# Patient Record
Sex: Female | Born: 1935
Health system: Southern US, Community
[De-identification: ages and names within clinical notes are randomized; demographics above are authoritative.]

## PROBLEM LIST (undated history)

## (undated) DIAGNOSIS — F0280 Dementia in other diseases classified elsewhere without behavioral disturbance: Secondary | ICD-10-CM

## (undated) DIAGNOSIS — M109 Gout, unspecified: Secondary | ICD-10-CM

## (undated) DIAGNOSIS — J449 Chronic obstructive pulmonary disease, unspecified: Secondary | ICD-10-CM

## (undated) DIAGNOSIS — M1990 Unspecified osteoarthritis, unspecified site: Secondary | ICD-10-CM

## (undated) DIAGNOSIS — F419 Anxiety disorder, unspecified: Secondary | ICD-10-CM

## (undated) DIAGNOSIS — G309 Alzheimer's disease, unspecified: Secondary | ICD-10-CM

## (undated) DIAGNOSIS — A310 Pulmonary mycobacterial infection: Secondary | ICD-10-CM

## (undated) DIAGNOSIS — R51 Headache: Secondary | ICD-10-CM

## (undated) DIAGNOSIS — J479 Bronchiectasis, uncomplicated: Secondary | ICD-10-CM

## (undated) DIAGNOSIS — I82409 Acute embolism and thrombosis of unspecified deep veins of unspecified lower extremity: Secondary | ICD-10-CM

## (undated) DIAGNOSIS — K589 Irritable bowel syndrome without diarrhea: Secondary | ICD-10-CM

## (undated) DIAGNOSIS — Z77120 Contact with and (suspected) exposure to mold (toxic): Secondary | ICD-10-CM

## (undated) DIAGNOSIS — I10 Essential (primary) hypertension: Secondary | ICD-10-CM

## (undated) DIAGNOSIS — J189 Pneumonia, unspecified organism: Secondary | ICD-10-CM

## (undated) HISTORY — DX: Contact with and (suspected) exposure to mold (toxic): Z77.120

## (undated) HISTORY — PX: FRACTURE SURGERY: SHX138

## (undated) HISTORY — PX: ABDOMINAL HYSTERECTOMY: SHX81

## (undated) HISTORY — PX: CATARACT EXTRACTION: SUR2

## (undated) HISTORY — PX: CHOLECYSTECTOMY: SHX55

---

## 2003-01-23 ENCOUNTER — Ambulatory Visit (HOSPITAL_COMMUNITY): Admission: RE | Admit: 2003-01-23 | Discharge: 2003-01-23 | Payer: Self-pay | Admitting: Pulmonary Disease

## 2003-01-31 ENCOUNTER — Ambulatory Visit (HOSPITAL_COMMUNITY): Admission: RE | Admit: 2003-01-31 | Discharge: 2003-01-31 | Payer: Self-pay | Admitting: Pulmonary Disease

## 2003-08-03 ENCOUNTER — Observation Stay (HOSPITAL_COMMUNITY): Admission: RE | Admit: 2003-08-03 | Discharge: 2003-08-04 | Payer: Self-pay | Admitting: Obstetrics & Gynecology

## 2006-09-15 DIAGNOSIS — I82409 Acute embolism and thrombosis of unspecified deep veins of unspecified lower extremity: Secondary | ICD-10-CM

## 2006-09-15 HISTORY — DX: Acute embolism and thrombosis of unspecified deep veins of unspecified lower extremity: I82.409

## 2007-01-02 ENCOUNTER — Inpatient Hospital Stay (HOSPITAL_COMMUNITY): Admission: EM | Admit: 2007-01-02 | Discharge: 2007-01-05 | Payer: Self-pay | Admitting: Emergency Medicine

## 2007-01-04 ENCOUNTER — Ambulatory Visit: Payer: Self-pay | Admitting: Physical Medicine & Rehabilitation

## 2007-03-23 ENCOUNTER — Ambulatory Visit: Payer: Self-pay

## 2007-04-05 ENCOUNTER — Inpatient Hospital Stay (HOSPITAL_COMMUNITY): Admission: EM | Admit: 2007-04-05 | Discharge: 2007-04-08 | Payer: Self-pay | Admitting: Emergency Medicine

## 2010-08-22 ENCOUNTER — Inpatient Hospital Stay (HOSPITAL_COMMUNITY): Admission: EM | Admit: 2010-08-22 | Discharge: 2010-07-06 | Payer: Self-pay | Admitting: Emergency Medicine

## 2010-11-27 LAB — DIFFERENTIAL
Basophils Absolute: 0 10*3/uL (ref 0.0–0.1)
Basophils Relative: 0 % (ref 0–1)
Eosinophils Absolute: 0.1 10*3/uL (ref 0.0–0.7)
Eosinophils Relative: 0 % (ref 0–5)
Lymphocytes Relative: 6 % — ABNORMAL LOW (ref 12–46)
Lymphs Abs: 0.8 10*3/uL (ref 0.7–4.0)
Monocytes Absolute: 0.8 10*3/uL (ref 0.1–1.0)
Monocytes Relative: 6 % (ref 3–12)
Neutro Abs: 11.7 10*3/uL — ABNORMAL HIGH (ref 1.7–7.7)
Neutrophils Relative %: 88 % — ABNORMAL HIGH (ref 43–77)

## 2010-11-27 LAB — CBC
HCT: 37.6 % (ref 36.0–46.0)
Hemoglobin: 12.8 g/dL (ref 12.0–15.0)
MCH: 33.7 pg (ref 26.0–34.0)
MCHC: 34 g/dL (ref 30.0–36.0)
MCV: 98.9 fL (ref 78.0–100.0)
Platelets: 138 10*3/uL — ABNORMAL LOW (ref 150–400)
RBC: 3.8 MIL/uL — ABNORMAL LOW (ref 3.87–5.11)
RDW: 14.4 % (ref 11.5–15.5)
WBC: 13.3 10*3/uL — ABNORMAL HIGH (ref 4.0–10.5)

## 2010-11-27 LAB — URINALYSIS, ROUTINE W REFLEX MICROSCOPIC
Bilirubin Urine: NEGATIVE
Glucose, UA: NEGATIVE mg/dL
Hgb urine dipstick: NEGATIVE
Ketones, ur: NEGATIVE mg/dL
Nitrite: NEGATIVE
Protein, ur: NEGATIVE mg/dL
Specific Gravity, Urine: 1.01 (ref 1.005–1.030)
Urobilinogen, UA: 0.2 mg/dL (ref 0.0–1.0)
pH: 6.5 (ref 5.0–8.0)

## 2010-11-27 LAB — URINE MICROSCOPIC-ADD ON

## 2010-11-27 LAB — URINE CULTURE
Colony Count: 15000
Culture  Setup Time: 201110172305

## 2010-11-27 LAB — BASIC METABOLIC PANEL
BUN: 11 mg/dL (ref 6–23)
CO2: 28 mEq/L (ref 19–32)
Calcium: 8.3 mg/dL — ABNORMAL LOW (ref 8.4–10.5)
Chloride: 96 mEq/L (ref 96–112)
Creatinine, Ser: 0.95 mg/dL (ref 0.4–1.2)
GFR calc Af Amer: >60 mL/min (ref >60)
GFR calc non Af Amer: 57 mL/min — ABNORMAL LOW (ref >60)
Glucose, Bld: 201 mg/dL — ABNORMAL HIGH (ref 70–99)
Potassium: 3.3 mEq/L — ABNORMAL LOW (ref 3.5–5.1)
Sodium: 131 mEq/L — ABNORMAL LOW (ref 135–145)

## 2011-01-28 NOTE — Discharge Summary (Signed)
[name], [name] NO.:  [id number]   MEDICAL RECORD NO.:  [id number]          PATIENT TYPE:  INP   LOCATION:  5002                         FACILITY:  [facility]   PHYSICIAN:  Loreta Ave, M.D. DATE OF BIRTH:  May 02, 1928   DATE OF ADMISSION:  01/02/2007  DATE OF DISCHARGE:  01/05/2007                               DISCHARGE SUMMARY   FINAL DIAGNOSES:  1. Status post open reduction and internal fixation left ankle and      I&D.  2. Gout.  3. Depression.  4. Hypertension.   HISTORY OF PRESENT ILLNESS:  A 77 year old white female fell in her home  on January 02, 2007 walking downstairs.  She twisted her left ankle.  She  was unable to ambulate after this incident.  She was brought to the  __________ ankle.  After the patient was evaluated in the ER she was  taken to the operating room and an open reduction internal fixation  procedure of the left ankle performed with an I&D.  Surgeon, Loreta Ave, M.D. and assistant Genene Churn. Barry Dienes, P.A.-C.  Anesthesia was  general.  Tourniquet time two hours.  One drain placed.  There were no  surgical or anesthesia complications; and the patient was transferred to  recovery in stable condition.  On January 03, 2007 the patient had good  pain control, complained of productive cough with green sputum times one  to two weeks.  She thinks that she has had the flu; but was not ever  evaluated by her primary care doctor.  Some dyspnea over the last couple  of weeks.  No complaints of chest pain.  Temperature 101.7, pulse 108,  respirations 20, blood pressure 136/66.  WBC 8.8, hematocrit 26.4,  hemoglobin 9.2, platelets 164.  Sodium 136, potassium 3.5, chloride 98,  CO2 32, BUN 6, creatinine 0.99, glucose 119.  Lungs with scattered  rhonchi bilaterally, a few wheezes.  Wound intact in the left lower  extremity.  Sensation intact.  Moves toes well.  Respiratory symptoms  likely secondary to COPD exacerbation.  Start a Z-pac.  Give  albuterol  nebulize treatment.  Discontinue Percocet.  Start her on Vicodin.  All  movement in left lower extremity.   On January 04, 2007 the patient doing well.  Good pain control.  Cough and  dyspnea improved with nebulizer treatment.  Temp 98, pulse 118,  respirations 19 blood pressure 133/63.  Hemoglobin 8.9 and hematocrit  25.2, glucose 134.  The wound looked good, Staples intact.  Penrose  drain removed.  Moves toes well and sensation intact.  Good capillary  refill.   On January 05, 2007 the patient doing much better, they agree with  therapy.  Cough improved.  No complaints of fatigue.  Dizziness when  getting up.  She says that she is ready to go home.  Vital signs stable,  afebrile.  Hemoglobin 8.6 hematocrit 25.6, WBC 9.1, platelets 179.  Sodium 136,  potassium 3.6, chloride 99, CO2 32, BUN 8, creatinine 0.8,  glucose 130.  Splint intact.  Sensation intact.  Moves toes well.  CONDITION:  Good and stable.   DISPOSITION:  Discharge home.   MEDICATIONS:  1. Norco 5/325 one to two p.o. q.4-6 h. pain.  2. FeSo4 taking 325 mg p.o. b.i.d.; to be taken with food.  3. Azithromycin times three days.  4. Indocin.  5. Allopurinol.  6. Paxil.  7. Triamterene/HCTZ.  8. Atenolol.   INSTRUCTIONS:  The patient will work with the home health, PT and OT.  She is nonweightbearing on left lower extremity times six to eight weeks  postop.  Will not remove splint or get this wet.  Follow up in one week  for a recheck.  Return sooner if needed.  Elevate left lower extremity  as much as possible to decrease swelling.      Genene Churn. Denton Meek.      Loreta Ave, M.D.  Electronically Signed    JMO/MEDQ  D:  03/10/2007  T:  03/10/2007  Job:  [id number]

## 2011-01-28 NOTE — Group Therapy Note (Signed)
NAMEREY, FORS NO.:  [id number]   MEDICAL RECORD NO.:  [id number]          PATIENT TYPE:  INP   LOCATION:  A202                          FACILITY:  [facility]   PHYSICIAN:  Edward L. Juanetta Gosling, M.D.DATE OF BIRTH:  1928-02-24   DATE OF PROCEDURE:  04/05/2007  DATE OF DISCHARGE:                                 PROGRESS NOTE   Stacie Wright is feeling well and has no complaints.  She is overall  improved.   PHYSICAL EXAMINATION:  VITAL SIGNS:  Her physical examination today  shows temperature is 98.1, pulse 72, respirations 20, blood pressure  154/68, O2 saturations 96% on room air.  CHEST:  Clear.  HEART:  Regular.  GENERAL:  She looks much more comfortable.   Her hemoglobin is 12.4 the same as yesterday.  White count 6100.  Protime is down to 50.5 with an INR of 5.1.   ASSESSMENT:  She has over anticoagulation with a coagulopathy.   PLAN:  Watch her one more day.  She shows no overt signs of bleeding but  with her having a fracture I think we should make because of her  instability I want to make sure that her finding is better before she  goes home.  She is going to have a PT/INR and H&H in the morning and I  am going to go ahead and get a venous study of her left leg because she  was actually scheduled for that as an outpatient.  Otherwise we will  plan to continue her medications, treatments, and follow.      Edward L. Juanetta Gosling, M.D.  Electronically Signed     ELH/MEDQ  D:  04/07/2007  T:  04/07/2007  Job:  [id number]

## 2011-01-28 NOTE — Group Therapy Note (Signed)
NAMELACHAE, HOHLER NO.:  [id number]   MEDICAL RECORD NO.:  [id number]          PATIENT TYPE:  INP   LOCATION:  A202                          FACILITY:  [facility]   PHYSICIAN:  Edward L. Juanetta Gosling, M.D.DATE OF BIRTH:  June 21, 1928   DATE OF PROCEDURE:  04/08/2007  DATE OF DISCHARGE:  04/08/2007                                 PROGRESS NOTE   Ms. Kramar says she is feeling better.  She has no complaints.  She has  not noticed any sort of bleeding of any kind.  She feels well and she  wants to go home.  Her prothrombin time and INR still elevated, but she  is now at an INR of 5 with a ProTime of 49.8.  I have discussed this  with her husband and with her, explained the risks versus benefits of  going home.  She wants to go home and just not take any Coumadin until  the 26th at least.  I will have her get a prothrombin time done by Home  Health then.  I expect at that point on the 26th her INR will be about  3.  At that point she will need to restart Coumadin because otherwise  she will have a subtherapeutic ProTime.  In the meantime I have  explained her not to take any aspirin, to be very careful, avoid falls,  etc.  She understands, is anxious to go home.  She did have a study done  yesterday that showed she has DVT still, not to surprising.  I do not  think she has had time for the recanalization process take place.      Edward L. Juanetta Gosling, M.D.  Electronically Signed     ELH/MEDQ  D:  04/08/2007  T:  04/08/2007  Job:  [id number]

## 2011-01-28 NOTE — Discharge Summary (Signed)
NAMEATHALIAH, BAUMBACH NO.:  [id number]   MEDICAL RECORD NO.:  [id number]          PATIENT TYPE:  INP   LOCATION:  A202                          FACILITY:  [facility]   PHYSICIAN:  Edward L. Juanetta Gosling, M.D.DATE OF BIRTH:  03/12/1928   DATE OF ADMISSION:  04/05/2007  DATE OF DISCHARGE:  07/24/2008LH                               DISCHARGE SUMMARY   DISCHARGE DIAGNOSES:  1. Coagulopathy.  2. Hypertension.  3. Deep vein thrombosis.  4. Fractured leg.  5. History of cholecystectomy.  6. History of hysterectomy.  7. History of a rectal surgery.  8. Gout.  9. Depression.   Marland KitchenHISTORY:  Ms. Pember is a 77 year old who had been on Coumadin and  Lovenox.  She had a pro time about a week ago that was therapeutic.  I  asked the family to stop the Lovenox but her husband did not understand  that as they were continuing her on Lovenox.  She came to my office on  the 17th.  INR was 4.  I was out of my office.  This was called Dr.  Ouida Sills, who advised her to cut the dose but there was miscommunication  between Mr. Letendre and my office and Dr. Alonza Smoker office and she has  continued on the same dose.  On the 21s I had her come to the emergency  room, where she was noted to have a very elevated prothrombin time and  INR, so she is being admitted to be covered.  She did receive vitamin K  in the emergency room as well.   Physical exam showed that she did not appear to be in any acute  distress.  Temperature was 98.3, pulse 86, respirations 20, blood  pressure 157/78, O2 saturation 96%.  She said that she had had a nose  bleed but she had no bleeding in her nose at the time of admission.  The  rest of her examination was unremarkable.  She did not have any signs of  bleeding.   Her hemoglobin was 12.4, hematocrit 37.7.  Prothrombin time was 76.6  with an INR of 8.4 after receiving 2 mg of vitamin K in the emergency  room.  She was watched carefully.  Her INR came down to 5.  She  requested to be allowed to be discharged at that point and I discharged  her home with home health services, and she is going to have a PT/INR  tomorrow and on the 25th, at which point I am expecting that probably on  the 25th she will be able to resume her Coumadin.  I am going to stop  the Lovenox.  She is going to continue on:   1. Allopurinol 300 mg.  2. Paxil 20 mg daily.  3. Triamterene/hydrochlorothiazide 37.5/25 mg one daily.  4. Tenormin 25 mg daily.   She was told to avoid aspirin in any form, to call with questions.      Edward L. Juanetta Gosling, M.D.  Electronically Signed     ELH/MEDQ  D:  04/08/2007  T:  04/08/2007  Job:  [id number]

## 2011-01-28 NOTE — H&P (Signed)
Stacie Wright, Stacie Wright NO.:  [id number]   MEDICAL RECORD NO.:  [id number]          PATIENT TYPE:  INP   LOCATION:  A202                          FACILITY:  [facility]   PHYSICIAN:  Edward L. Juanetta Gosling, M.D.DATE OF BIRTH:  15-Jul-1928   DATE OF ADMISSION:  04/05/2007  DATE OF DISCHARGE:  LH                              HISTORY & PHYSICAL   REASON FOR ADMISSION:  Coagulopathy.   HISTORY:  Ms. Stacie Wright is a 77 year old who came to the emergency room.  She had been on Coumadin and Lovenox originally and after a protime done  of approximately a week ago, I had told the family to stop the Lovenox.  Mr. Jantz did not understand that and she has been continuing on  Lovenox.  She came to my office on Thursday, had a prothrombin time done  and the INR was 4.  This was called to Dr. Ouida Sills, who was covering for  me and there was a miscommunication between my office and  Dr. Alonza Smoker  office and she has continued on the same dose.  On the day of admission,  April 05, 2007, I asked her to come to the hospital to get a prothrombin  time done because of concern about the PT/INR and her PT/INR was  elevated.  She had had an episode of epistaxis that stopped with no  treatment except for pressure and she has been brought into the hospital  because of the elevated INR and the epistaxis.  I have told her and her  husband both now that she should stop the Lovenox.  She is going to have  to have her Coumadin dose adjusted.  She did receive some vitamin K in  the emergency room.   PAST MEDICAL HISTORY:  1. Positive for hypertension.  2. She has had a severe fracture in the leg and ended up with a DVT as      a complication of that.  3. She has had a cholecystectomy.  4. Hysterectomy.  5. Rectal surgery.  6. History of  hypertension.   SOCIAL HISTORY:  She is a nonsmoker.  She does not drink any alcohol.  She does not use any illicit drugs.  She lives at home with her husband.   FAMILY  HISTORY:  Negative for any coagulopathy.   PHYSICAL EXAMINATION:  GENERAL APPEARANCE:  Now shows a well-developed,  well-nourished female who does not appear to be in any acute distress at  this point.  VITAL SIGNS:  Her temperature is 98.3, pulse 86, respirations 20, blood  pressure 157/78, O2 sat 96% on room air.  HEENT:  Pupils are reactive to light and accommodation.  She does not  have any bleeding in her nose now.  NECK:  Supple without masses.  CHEST:  Clear.  HEART:  Regular.  ABDOMEN:  Soft.  EXTREMITIES:  No edema.  CENTRAL NERVOUS SYSTEM:  Examination is grossly intact.   LABORATORY DATA:  Hemoglobin level 12.4, hematocrit 37.  Her prothrombin  time now 76.6 with an INR of 8.4.  She received vitamin K in the  emergency  room.  She received 2 mg p.o.  Typically, there will be a  significant change in INR with holding Coumadin and receiving vitamin K.  I am going to watch her carefully, wait until the morning, repeat her  PT/INR and see how much it changes now that she is off the Lovenox and  has received vitamin K and her Coumadin has been held.      Edward L. Juanetta Gosling, M.D.  Electronically Signed     ELH/MEDQ  D:  04/06/2007  T:  04/06/2007  Job:  [id number]

## 2011-01-31 NOTE — H&P (Signed)
NAMEELAN, MCELVAIN NO.:  [id number]   MEDICAL RECORD NO.:  [id number]          PATIENT TYPE:  INP   LOCATION:  1826                         FACILITY:  [facility]   PHYSICIAN:  Loreta Ave, M.D. DATE OF BIRTH:  11-Sep-1928   DATE OF ADMISSION:  01/02/2007  DATE OF DISCHARGE:                              HISTORY & PHYSICAL   CHIEF COMPLAINT:  Open left ankle fracture.   HISTORY OF PRESENT ILLNESS:  This 77 year old white female fell today in  her home, going down some stairs.  She twisted her left ankle.  She was  unable to ambulate after this incident.  She did have an open injury to  the medial ankle with bleeding.  The patient was brought to the [facility] Emergency Department and x-rays were taken which  showed a trimalleolar fracture/dislocation of the left ankle.  The  patient denies any other injuries.   CURRENT MEDICATIONS:  1. Hyoscyamine sulfate.  2. Indocin.  3. Allopurinol.  4. Fluoxetine.  5. Triamterene/hydrochlorothiazide.  6. Atenolol.   ALLERGIES:  No known drug allergies.   PAST MEDICAL/SURGICAL HISTORY:  1. Gout.  2. Depression.  3. Hypertension.  4. Gastroesophageal reflux disease.  5. Irritable bowel syndrome.   FAMILY HISTORY:  Noncontributory.   SOCIAL HISTORY:  The patient is married.  Denies smoking or ETOH use.   REVIEW OF SYSTEMS:  Denies any cardiac, pulmonary, GI, genitourinary or  neuro complaints.   PHYSICAL EXAMINATION:  VITAL SIGNS:  Temperature 98.7 degrees, pulse 75,  respirations 14, blood pressure 163/86.  GENERAL:  A pleasant elderly white female, alert and oriented x3, in no  acute distress.  She is comfortable after being given IV pain  medications.  NECK:  Good range of motion.  Nontender.  LUNGS:  Clear to auscultation bilaterally.  No wheezes, rales or  rhonchi.  HEART:  Regular rate and rhythm.  S1 and S2.  ABDOMEN:  Round, non-distended.  Normal bowel sounds x4.  Soft,  nontender.  EXTREMITIES:  Left ankle:  There is a valgus dislocation.  There is a  large open laceration to the medial ankle.  There is bleeding but this  is much controlled currently.  Moves her toes well.  Sensation intact.   X-rays show a sharp malleolar fracture/dislocation.   LABORATORY DATA:  WBC 11.2, hemoglobin 13, hematocrit 38.4, platelets  187.  PT 14, INR 1.1.  Sodium 137, potassium 3.1, chloride 102, glucose  103, BUN 9, creatinine 1.l.   ASSESSMENT:  Open left ankle fracture/dislocation.   PLAN:  Will take the patient to the operating room today for an ORIF of  the left ankle and I&D.  The procedure is discussed with the patient and  with her husband who is present.  They understand the potential risks  that are involved with the procedure.  She will be admitted to the  orthopedic floor after our surgery.  She will be non-weightbearing to  the left lower extremity.  All questions were answered.     ______________________________  Modena Nunnery, P.A.-C.      Reuel Boom  Georg Ruddle, M.D.  Electronically Signed    JM/MEDQ  D:  01/02/2007  T:  01/02/2007  Job:  [id number]

## 2011-01-31 NOTE — H&P (Signed)
NAMEJORDON, Stacie Wright                         ACCOUNT NO.:  [id number]   MEDICAL RECORD NO.:  [id number]                   PATIENT TYPE:  AMB   LOCATION:  DAY                                  FACILITY:  [facility]   PHYSICIAN:  Lazaro Arms, M.D.                DATE OF BIRTH:  July 15, 1928   DATE OF ADMISSION:  DATE OF DISCHARGE:                                HISTORY & PHYSICAL   ANTICIPATED DATE OF ADMISSION:  August 03, 2003.   HISTORY OF PRESENT ILLNESS:  Stacie Wright is a 77 year old white female, gravida  2, para 2, who presented to our office originally on June 20, 2003  thinking that her bladder had dropped.  She did not complain of any loss of  urine, but she did have urgency, but no loss of urine with coughing or  straining.  On examination she was found to have a rectocele.  Her  urodynamic testing was negative.  She does not have a cystocele or a  urethrocele.  She has a Grade III rectocele and no enterocele appreciated  even on standing exam.   PAST MEDICAL HISTORY:  1. Hypertension.  2. Gout.  3. Irritable bowel syndrome.  4. Depression.  5. Shingles.   CURRENT MEDICATIONS:  1. Paxil 20 mg a day.  2. Levbid daily.  3. Dyazide 37.5/25 a day.  4. Allopurinol 300 mg a day.  5. Tenormin 50 mg a day.   PAST SURGICAL HISTORY:  The patient had a hysterectomy in 1975 and she has  had a cholecystectomy at [facility] over in [place].   ALLERGIES:  The patient's allergies are to MULTIPLE ANTIBIOTICS, but she is  not allergic to Keflex according to specific questioning.   REVIEW OF SYSTEMS:  Review of systems is otherwise negative.   PHYSICAL EXAMINATION:  VITAL SIGNS:  Blood pressure is 140/62.  Weight is  185 pounds.  HEENT:  Unremarkable.  NECK:  Thyroid is normal.  LUNGS:  Lungs are clear.  HEART:  Heart has regular rate and rhythm without murmur, regurg or gallop.  BREASTS:  Breasts are without mass or discharge, or skin changes.  ABDOMEN:  Abdomen is  benign without hepatosplenomegaly or masses.  GENITALIA:  The patient has atrophic external genitalia.  She has a Grade  III rectocele.  No cystocele or urethrocele.  PELVIC:  Pelvic exam is otherwise negative.  RECTAL:  Rectal exam is otherwise negative, except for the rectocele.  EXTREMITIES:  Extremities are warm with no edema.  NEUROLOGIC:  Neurologic exam is grossly intact.   IMPRESSION:  1. Grade III rectocele, symptomatic.  2. Constipation.   PLAN:  The patient is admitted for a rectocele repair using a PelviSoft  graft and vaginal vault suspension.   The patient understands the risks, benefits, indications and alternatives,  and will proceed.     ___________________________________________  Lazaro Arms, M.D.   Loraine Maple  D:  08/02/2003  T:  08/03/2003  Job:  [id number]

## 2011-01-31 NOTE — Op Note (Signed)
NAMEKIIRA, BRACH NO.:  [id number]   MEDICAL RECORD NO.:  [id number]          PATIENT TYPE:  INP   LOCATION:  5002                         FACILITY:  [facility]   PHYSICIAN:  Loreta Ave, M.D. DATE OF BIRTH:  September 05, 1928   DATE OF PROCEDURE:  01/02/2007  DATE OF DISCHARGE:                               OPERATIVE REPORT   PREOPERATIVE DIAGNOSIS:  Grade 3 a markedly displaced open trimalleolar  ankle fracture  dislocation left, the entire distal tibia out through  the medial  wound.   POSTOPERATIVE DIAGNOSIS:  Grade 3 a markedly displaced open trimalleolar  ankle fracture  dislocation left, the entire distal tibia out through  the medial  wound.   PROCEDURE:  Open reduction and  internal fixation of the lateral  malleolus with a 7 hole Synthes locking plate laterally.  Closed  reduction of the posterior malleolar fragment.  Primary closure of  the  soft tissue injury, deltoid and capsular tear medially.  Loose  closure  over a Penrose drain.   SURGEON:  Loreta Ave, MD   ASSISTANT:  Genene Churn. Barry Dienes, P.A. who was present throughout the entire  case.   ANESTHESIA:  General.   BLOOD LOSS:  Minimal.  Tourniquet time 1 hour 30 minutes.   SPECIMENS:  None.  Cultures none.   COMPLICATIONS:  None.   DRESSING:  Soft compressive with a short-leg splint.   PROCEDURE:  The patient brought to the operating room, placed on  operating  table in supine position.  After adequate anesthesia been  obtained, the wound was examined.  There was a large laceration  extending more than 15 cm from above the medial side of the ankle  all  way down distal to the medial malleolus.  The entire distal  tibia was  sticking out through this and the talus was dislocated  posterolaterally  behind this, tenting of the posterior tibial  tendon and neurovascular  structures which had slid around behind  the tibia.  The wound was  explored, thoroughly irrigated with  almost 6  liters of saline.  The  ankle itself exposed.  All  articular surfaces inspected and cleaned  off.  Fortunately, the  talus looked good.  When that was completed and  all soft tissue  that was interposed was cleared, I was able to reduced  the talus  back into anatomic position underneath the tibia.  This still  a  very unstable construct.  Attention was then turned laterally.  I  made a longitudinal incision over the fibular fracture.  Subperiosteal  exposure of the fibula.  It was then anatomically  reduced and fixed  initially with a six-hole plate.  Her bone was  soft enough that this  did not give me enough fixation distally.  I took out the six-hole plate  and into a seven hole plate which  gave excellent alignment, capturing  and fixation of fibular  fracture and also markedly improved stability  of the ankle injury.  When that was completed, attention was returned  medially.  The  wound irrigated once  again.  I then did a primary repair  of the  deltoid ligament with numerous Ethibond sutures from the midline  anteriorly all the way back to behind the medial malleolus.  I  then  repaired the retinaculum over the posterior tibial tendon,  over top of  that, with Vicryl.  This gave a nice firm reconstruction of all soft  tissue complex in that area.  The  entire wound thoroughly irrigated.  I  had to extend this a little  bit distally to get up exposure distally.  I then brought a  Penrose drain deep into the ankle anteriorly and  brought out  superiorly.  The wound was then loosely closed with Vicryl  and  then staples.  A well-padded short-leg splint was applied in  anatomic position.  At completion, the entire construct was looked  at  before and after the splint with fluoroscopy.  I had an  anatomic  alignment of all fractures as well as the ankle.  The  posterior  malleolar fracture was less than 20% the joint, did not  involve much of  the articular surface at all and was  sufficiently  aligned did not need  to repair that for stability or for articular  congruency.  Once splint  was hardened anesthesia reversed.  Brought to recovery room.  Tolerated  surgery well without  complications.      Loreta Ave, M.D.  Electronically Signed     DFM/MEDQ  D:  01/03/2007  T:  01/04/2007  Job:  [id number]

## 2011-01-31 NOTE — Op Note (Signed)
Stacie Wright, MOHAMMAD NO.:  [id number]   MEDICAL RECORD NO.:  [id number]          PATIENT TYPE:  INP   LOCATION:  5002                         FACILITY:  [facility]   PHYSICIAN:  Loreta Ave, M.D. DATE OF BIRTH:  [birth year]   DATE OF PROCEDURE:  01/02/2007  DATE OF DISCHARGE:  01/03/2007                               OPERATIVE REPORT   PREOPERATIVE DIAGNOSES:  A grade 3 markedly displaced open trimalleolar  ankle fracture dislocation, left.  The entire distal tibia out through  the medial wound.   POSTOPERATIVE DIAGNOSES:  A grade 3 markedly displaced open trimalleolar  ankle fracture dislocation, left.  The entire distal tibia out through  the medial wound.   OPERATIVE PROCEDURE:  Open reduction of ankle dislocation, with an open  reduction internal fixation of the lateral malleolus; with a 7-hole  Synthes locking plate laterally.  Closed reduction of the posterior  malleolar fragment.  Primary closure of the soft tissue injury in  deltoid and capsular tear medially.  Loose closure over a Penrose drain.   SURGEON:  Loreta Ave, M.D.   ASSISTANT:  Genene Churn. Denton Meek., who was present throughout the entire  case.   ANESTHESIA:  General.   BLOOD LOSS:  Minimal.   TOURNIQUET TIME:  1 hour 30 minutes.   SPECIMENS:  None.   CULTURES:  None.   COMPLICATIONS:  None.   DRESSINGS:  Soft compressive with a short-leg splint.   PROCEDURE:  The patient was brought to the operating room, placed on the  operating room table in supine position.  After adequate anesthesia had  been obtained, her wound was examined.  There was a large laceration  extending more than 15 cm from above the medial side of the ankle, all  the way down distal to the medial malleolus.  The entire distal tibia  was sticking out through this and the talus was dislocated out  posterolaterally behind this.  Tenting of the posterior tibial tendon  and neurovascular structures, which  had slid around behind the tibia.  The wound was explored.  Thoroughly irrigated with almost 6 liters of  saline.  The ankle itself exposed.  All articular surfaces inspected and  cleaned off.  Fortunately, the talus looked good.  When that was  completed and all soft tissue that was interposed was cleared, I was  able to reduce the talus back into anatomic position underneath the  tibia.  This was still a very unstable construct.   Attention was then turned laterally.  I made a longitudinal incision  over the fibular fracture.  Subperiosteal exposure of the fibula.  It  was then anatomically reduced and fixed initially with a 6-hole plate.  Her bone was soft enough that this did not give me enough fixation  distally.  I took out the 6-hole plate, and went to a 7-hole plate --  which gave excellent alignment, capturing of fixation of the fibular  fracture, and also markedly improved stability of the ankle injury.  When that was complete, attention was returned medially.  The wound  irrigated once  again.   I then did a primary repair of the deltoid ligament with numerous  Ethibond sutures, from the midline anteriorly all the way back to behind  the medial malleolus.  I then repaired the retinaculum over the  posterior tibial tendon, over top of that with Vicryl.  This gave a nice  firm reconstruction of all the soft tissue complex in that area.  The  entire wound was thoroughly irrigated.  I had to extend this a little  bit distally to get up exposure distally.  I then brought a Penrose  drain deep up into the ankle anteriorly, and brought it out superiorly.  The wound was then loosely closed with Vicryl and then staples.  A well-  padded short-leg splint was applied in anatomic position.  At  completion, the entire construct was looked at before and after the  splint with fluoroscopy.  I had an anatomic alignment of all fractures,  as well as the ankle.  The posterior malleolar fracture  was less than  20% the joint; did not involve much of the articular surface at all.  It  was sufficiently aligned so that I did not need to repair that for  stability or for articular congruency.  Once the splint was hardened,  anesthesia was reversed.  Patient brought to the recovery room.  Tolerated the surgery well without complications.      Loreta Ave, M.D.  Electronically Signed     DFM/MEDQ  D:  01/03/2007  T:  01/04/2007  Job:  [id number]

## 2011-01-31 NOTE — Op Note (Signed)
Stacie Wright, Stacie Wright                         ACCOUNT NO.:  [id number]   MEDICAL RECORD NO.:  [id number]                   PATIENT TYPE:  OBV   LOCATION:  A323                                 FACILITY:  [facility]   PHYSICIAN:  Lazaro Arms, M.D.                DATE OF BIRTH:  October 07, 1927   DATE OF PROCEDURE:  08/03/2003  DATE OF DISCHARGE:  08/04/2003                                 OPERATIVE REPORT   PREOPERATIVE DIAGNOSIS:  Symptomatic rectocele.   POSTOPERATIVE DIAGNOSIS:  Symptomatic rectocele.   PROCEDURE:  1. Posterior colporrhaphy using a Pelvicol graft.  2. Interspinous vaginal vault suspension using a Pelvicol graft.   SURGEON:  Lazaro Arms, M.D.   ANESTHESIA:  General endotracheal.   FINDINGS:  The patient was noted to have a grade 3 rectocele in the office.  She had no cystocele or urethrocele and her vaginal apex was also  significantly prolapsed. As a result, she was admitted for repair. There  were no other additional findings intraoperatively.   DESCRIPTION OF PROCEDURE:  The patient was taken to the operating room,  placed in supine position where she underwent general endotracheal  anesthesia. She was then placed in the dorsal lithotomy position, prepped  and draped in the usual sterile fashion, a Foley catheter was placed. A 0.5%  Marcaine was injected in the midline of the posterior vagina and the  mucocutaneous junction was incised and it was then dissected off the rectum  and incised up to the vaginal apex. A 6 x 8 cm Pelvicol graft was then used  and the angles of the graft were fixed to the sacrospinous ligament  bilaterally using a Capio needle retrieving system and an #0 Vicryl hub  bilaterally. We then attached it loosely to the fascia levator ani down each  side of the graft. Excess vagina was trimmed away, the apex of the vagina  was then attached to the apex of the graft which was an interspinous vaginal  vault suspension. The vagina was then closed  in interrupted fashion and a  perineal body repair was performed. The patient tolerated the procedure  well, she experienced 150 mL of blood loss and was taken to the recovery  room in good stable condition, all counts were correct.      ___________________________________________                                            Lazaro Arms, M.D.   LHE/MEDQ  D:  08/17/2003  T:  08/17/2003  Job:  [id number]

## 2011-06-30 LAB — DIFFERENTIAL
Basophils Absolute: 0
Basophils Relative: 0
Eosinophils Absolute: 0.4
Eosinophils Relative: 6 — ABNORMAL HIGH
Lymphocytes Relative: 20
Lymphs Abs: 1.2
Monocytes Absolute: 0.7
Monocytes Relative: 12 — ABNORMAL HIGH
Neutro Abs: 3.7
Neutrophils Relative %: 61

## 2011-06-30 LAB — CBC
HCT: 37.4
HCT: 39.4
Hemoglobin: 12.4
Hemoglobin: 13.4
MCHC: 33.2
MCHC: 34.1
MCV: 93.6
MCV: 93.9
Platelets: 244
Platelets: 291
RBC: 4
RBC: 4.2
RDW: 14.1 — ABNORMAL HIGH
RDW: 14.2 — ABNORMAL HIGH
WBC: 6.1
WBC: 6.5

## 2011-06-30 LAB — PROTIME-INR
INR: 5 — ABNORMAL HIGH
INR: 5.1
INR: 8.4
INR: 9.2
Prothrombin Time: 49.8 — ABNORMAL HIGH
Prothrombin Time: 50.5 — ABNORMAL HIGH
Prothrombin Time: 76.6 — ABNORMAL HIGH
Prothrombin Time: 82.6 — ABNORMAL HIGH

## 2011-06-30 LAB — HEMOGLOBIN AND HEMATOCRIT, BLOOD
HCT: 37
Hemoglobin: 12.4

## 2011-06-30 LAB — APTT: aPTT: 90 — ABNORMAL HIGH

## 2012-03-12 ENCOUNTER — Ambulatory Visit (HOSPITAL_COMMUNITY)
Admission: RE | Admit: 2012-03-12 | Discharge: 2012-03-12 | Disposition: A | Discharge status: Home / Self Care | Payer: Medicare Other | Source: Ambulatory Visit | Attending: Pulmonary Disease | Admitting: Pulmonary Disease

## 2012-03-12 ENCOUNTER — Other Ambulatory Visit (HOSPITAL_COMMUNITY): Payer: Self-pay | Admitting: Pulmonary Disease

## 2012-03-12 DIAGNOSIS — R918 Other nonspecific abnormal finding of lung field: Secondary | ICD-10-CM | POA: Insufficient documentation

## 2012-03-12 DIAGNOSIS — R05 Cough: Secondary | ICD-10-CM

## 2012-03-12 DIAGNOSIS — R059 Cough, unspecified: Secondary | ICD-10-CM | POA: Insufficient documentation

## 2012-09-15 DIAGNOSIS — J479 Bronchiectasis, uncomplicated: Secondary | ICD-10-CM

## 2012-09-15 DIAGNOSIS — A310 Pulmonary mycobacterial infection: Secondary | ICD-10-CM

## 2012-09-15 HISTORY — DX: Bronchiectasis, uncomplicated: J47.9

## 2012-09-15 HISTORY — DX: Pulmonary mycobacterial infection: A31.0

## 2013-03-28 ENCOUNTER — Ambulatory Visit (HOSPITAL_COMMUNITY)
Admission: RE | Admit: 2013-03-28 | Discharge: 2013-03-28 | Disposition: A | Discharge status: Home / Self Care | Payer: Medicare Other | Source: Ambulatory Visit | Attending: Pulmonary Disease | Admitting: Pulmonary Disease

## 2013-03-28 ENCOUNTER — Other Ambulatory Visit (HOSPITAL_COMMUNITY): Payer: Self-pay | Admitting: Pulmonary Disease

## 2013-03-28 DIAGNOSIS — R5381 Other malaise: Secondary | ICD-10-CM | POA: Insufficient documentation

## 2013-03-28 DIAGNOSIS — J189 Pneumonia, unspecified organism: Secondary | ICD-10-CM | POA: Insufficient documentation

## 2013-03-28 DIAGNOSIS — R05 Cough: Secondary | ICD-10-CM

## 2013-03-28 DIAGNOSIS — R059 Cough, unspecified: Secondary | ICD-10-CM | POA: Insufficient documentation

## 2013-04-15 DIAGNOSIS — J189 Pneumonia, unspecified organism: Secondary | ICD-10-CM

## 2013-04-15 HISTORY — DX: Pneumonia, unspecified organism: J18.9

## 2013-04-18 ENCOUNTER — Other Ambulatory Visit (HOSPITAL_COMMUNITY): Payer: Self-pay | Admitting: Pulmonary Disease

## 2013-04-18 ENCOUNTER — Ambulatory Visit (HOSPITAL_COMMUNITY)
Admission: RE | Admit: 2013-04-18 | Discharge: 2013-04-18 | Disposition: A | Discharge status: Home / Self Care | Payer: Medicare Other | Source: Ambulatory Visit | Attending: Pulmonary Disease | Admitting: Pulmonary Disease

## 2013-04-18 DIAGNOSIS — M549 Dorsalgia, unspecified: Secondary | ICD-10-CM

## 2013-04-18 DIAGNOSIS — M546 Pain in thoracic spine: Secondary | ICD-10-CM | POA: Insufficient documentation

## 2013-04-25 ENCOUNTER — Emergency Department (HOSPITAL_COMMUNITY): Payer: Medicare Other

## 2013-04-25 ENCOUNTER — Encounter (HOSPITAL_COMMUNITY): Payer: Self-pay | Admitting: *Deleted

## 2013-04-25 ENCOUNTER — Inpatient Hospital Stay (HOSPITAL_COMMUNITY)
Admission: EM | Admit: 2013-04-25 | Discharge: 2013-04-29 | DRG: 194 | Disposition: A | Discharge status: Home Health | Payer: Medicare Other | Attending: Internal Medicine | Admitting: Internal Medicine

## 2013-04-25 ENCOUNTER — Inpatient Hospital Stay (HOSPITAL_COMMUNITY): Payer: Medicare Other

## 2013-04-25 DIAGNOSIS — J159 Unspecified bacterial pneumonia: Principal | ICD-10-CM | POA: Diagnosis present

## 2013-04-25 DIAGNOSIS — Z7982 Long term (current) use of aspirin: Secondary | ICD-10-CM

## 2013-04-25 DIAGNOSIS — M129 Arthropathy, unspecified: Secondary | ICD-10-CM | POA: Diagnosis present

## 2013-04-25 DIAGNOSIS — D72829 Elevated white blood cell count, unspecified: Secondary | ICD-10-CM | POA: Diagnosis present

## 2013-04-25 DIAGNOSIS — J189 Pneumonia, unspecified organism: Secondary | ICD-10-CM

## 2013-04-25 DIAGNOSIS — R531 Weakness: Secondary | ICD-10-CM | POA: Diagnosis present

## 2013-04-25 DIAGNOSIS — T380X5A Adverse effect of glucocorticoids and synthetic analogues, initial encounter: Secondary | ICD-10-CM | POA: Diagnosis present

## 2013-04-25 DIAGNOSIS — R911 Solitary pulmonary nodule: Secondary | ICD-10-CM

## 2013-04-25 DIAGNOSIS — R9389 Abnormal findings on diagnostic imaging of other specified body structures: Secondary | ICD-10-CM

## 2013-04-25 DIAGNOSIS — R5381 Other malaise: Secondary | ICD-10-CM

## 2013-04-25 DIAGNOSIS — J9611 Chronic respiratory failure with hypoxia: Secondary | ICD-10-CM | POA: Diagnosis present

## 2013-04-25 DIAGNOSIS — J441 Chronic obstructive pulmonary disease with (acute) exacerbation: Secondary | ICD-10-CM

## 2013-04-25 DIAGNOSIS — J440 Chronic obstructive pulmonary disease with acute lower respiratory infection: Secondary | ICD-10-CM | POA: Diagnosis present

## 2013-04-25 DIAGNOSIS — R5383 Other fatigue: Secondary | ICD-10-CM

## 2013-04-25 DIAGNOSIS — Z79899 Other long term (current) drug therapy: Secondary | ICD-10-CM

## 2013-04-25 DIAGNOSIS — F411 Generalized anxiety disorder: Secondary | ICD-10-CM | POA: Diagnosis present

## 2013-04-25 DIAGNOSIS — J209 Acute bronchitis, unspecified: Secondary | ICD-10-CM | POA: Diagnosis present

## 2013-04-25 DIAGNOSIS — K5900 Constipation, unspecified: Secondary | ICD-10-CM | POA: Diagnosis not present

## 2013-04-25 DIAGNOSIS — J479 Bronchiectasis, uncomplicated: Secondary | ICD-10-CM

## 2013-04-25 DIAGNOSIS — I10 Essential (primary) hypertension: Secondary | ICD-10-CM | POA: Diagnosis present

## 2013-04-25 DIAGNOSIS — R0902 Hypoxemia: Secondary | ICD-10-CM | POA: Diagnosis present

## 2013-04-25 DIAGNOSIS — R918 Other nonspecific abnormal finding of lung field: Secondary | ICD-10-CM

## 2013-04-25 HISTORY — DX: Essential (primary) hypertension: I10

## 2013-04-25 HISTORY — DX: Unspecified osteoarthritis, unspecified site: M19.90

## 2013-04-25 HISTORY — DX: Pneumonia, unspecified organism: J18.9

## 2013-04-25 HISTORY — DX: Anxiety disorder, unspecified: F41.9

## 2013-04-25 HISTORY — DX: Headache: R51

## 2013-04-25 HISTORY — DX: Chronic obstructive pulmonary disease, unspecified: J44.9

## 2013-04-25 LAB — COMPREHENSIVE METABOLIC PANEL
ALT: 22 U/L (ref 0–35)
Albumin: 3.1 g/dL — ABNORMAL LOW (ref 3.5–5.2)
Alkaline Phosphatase: 66 U/L (ref 39–117)
BUN: 22 mg/dL (ref 6–23)
Chloride: 92 mEq/L — ABNORMAL LOW (ref 96–112)
Potassium: 4.3 mEq/L (ref 3.5–5.1)
Sodium: 131 mEq/L — ABNORMAL LOW (ref 135–145)
Total Bilirubin: 0.4 mg/dL (ref 0.3–1.2)

## 2013-04-25 LAB — CBC WITH DIFFERENTIAL/PLATELET
Basophils Relative: 0 % (ref 0–1)
Hemoglobin: 14.1 g/dL (ref 12.0–15.0)
Lymphs Abs: 1.3 10*3/uL (ref 0.7–4.0)
MCHC: 35.3 g/dL (ref 30.0–36.0)
Monocytes Relative: 11 % (ref 3–12)
Neutro Abs: 14.6 10*3/uL — ABNORMAL HIGH (ref 1.7–7.7)
Neutrophils Relative %: 82 % — ABNORMAL HIGH (ref 43–77)
Platelets: 269 10*3/uL (ref 150–400)
RBC: 4.31 MIL/uL (ref 3.87–5.11)

## 2013-04-25 LAB — URINALYSIS, ROUTINE W REFLEX MICROSCOPIC
Bilirubin Urine: NEGATIVE
Glucose, UA: NEGATIVE mg/dL
Hgb urine dipstick: NEGATIVE
Ketones, ur: NEGATIVE mg/dL
Protein, ur: NEGATIVE mg/dL
pH: 8 (ref 5.0–8.0)

## 2013-04-25 LAB — STREP PNEUMONIAE URINARY ANTIGEN: Strep Pneumo Urinary Antigen: NEGATIVE

## 2013-04-25 LAB — URINE MICROSCOPIC-ADD ON

## 2013-04-25 MED ORDER — ALLOPURINOL 300 MG PO TABS
300.0000 mg | ORAL_TABLET | Freq: Every day | ORAL | Status: DC
  Administered 2013-04-25 – 2013-04-29 (×5): 300 mg via ORAL
  Filled 2013-04-25 (×5): qty 1

## 2013-04-25 MED ORDER — PAROXETINE HCL 20 MG PO TABS
20.0000 mg | ORAL_TABLET | ORAL | Status: DC
  Filled 2013-04-25: qty 1

## 2013-04-25 MED ORDER — ASPIRIN EC 81 MG PO TBEC
81.0000 mg | DELAYED_RELEASE_TABLET | Freq: Every day | ORAL | Status: DC
  Administered 2013-04-25 – 2013-04-29 (×5): 81 mg via ORAL
  Filled 2013-04-25 (×5): qty 1

## 2013-04-25 MED ORDER — SODIUM CHLORIDE 0.9 % IV SOLN
INTRAVENOUS | Status: DC
  Administered 2013-04-25 – 2013-04-29 (×7): via INTRAVENOUS

## 2013-04-25 MED ORDER — GUAIFENESIN-DM 100-10 MG/5ML PO SYRP
5.0000 mL | ORAL_SOLUTION | ORAL | Status: DC | PRN
  Administered 2013-04-25 – 2013-04-26 (×2): 5 mL via ORAL
  Filled 2013-04-25 (×2): qty 5

## 2013-04-25 MED ORDER — ACETAMINOPHEN 650 MG RE SUPP: 650.0000 mg | Freq: Four times a day (QID) | RECTAL | Status: DC | PRN

## 2013-04-25 MED ORDER — LEVOFLOXACIN IN D5W 750 MG/150ML IV SOLN
750.0000 mg | Freq: Once | INTRAVENOUS | Status: AC
  Administered 2013-04-25: 750 mg via INTRAVENOUS
  Filled 2013-04-25: qty 150

## 2013-04-25 MED ORDER — ACETAMINOPHEN 325 MG PO TABS: 650.0000 mg | ORAL_TABLET | Freq: Four times a day (QID) | ORAL | Status: DC | PRN

## 2013-04-25 MED ORDER — DEXTROSE 5 % IV SOLN
1.0000 g | Freq: Once | INTRAVENOUS | Status: AC
  Administered 2013-04-25: 1 g via INTRAVENOUS
  Filled 2013-04-25: qty 10

## 2013-04-25 MED ORDER — PAROXETINE HCL 20 MG PO TABS
20.0000 mg | ORAL_TABLET | Freq: Every day | ORAL | Status: DC
  Administered 2013-04-26 – 2013-04-29 (×4): 20 mg via ORAL
  Filled 2013-04-25 (×5): qty 1

## 2013-04-25 MED ORDER — GUAIFENESIN ER 600 MG PO TB12
1200.0000 mg | ORAL_TABLET | Freq: Two times a day (BID) | ORAL | Status: DC
  Administered 2013-04-25 – 2013-04-29 (×9): 1200 mg via ORAL
  Filled 2013-04-25 (×10): qty 2

## 2013-04-25 MED ORDER — LATANOPROST 0.005 % OP SOLN
1.0000 [drp] | Freq: Every day | OPHTHALMIC | Status: DC
Start: 2013-04-25 — End: 2013-04-29
  Administered 2013-04-25 – 2013-04-28 (×4): 1 [drp] via OPHTHALMIC
  Filled 2013-04-25: qty 2.5

## 2013-04-25 MED ORDER — TRIAMTERENE-HCTZ 37.5-25 MG PO TABS
1.0000 | ORAL_TABLET | ORAL | Status: DC
  Administered 2013-04-25 – 2013-04-29 (×3): 1 via ORAL
  Filled 2013-04-25 (×3): qty 1

## 2013-04-25 MED ORDER — ONDANSETRON HCL 4 MG/2ML IJ SOLN: 4.0000 mg | Freq: Four times a day (QID) | INTRAMUSCULAR | Status: DC | PRN

## 2013-04-25 MED ORDER — ALBUTEROL SULFATE (5 MG/ML) 0.5% IN NEBU
2.5000 mg | INHALATION_SOLUTION | Freq: Four times a day (QID) | RESPIRATORY_TRACT | Status: DC
  Administered 2013-04-25 – 2013-04-29 (×14): 2.5 mg via RESPIRATORY_TRACT
  Filled 2013-04-25 (×13): qty 0.5

## 2013-04-25 MED ORDER — ONDANSETRON HCL 4 MG PO TABS: 4.0000 mg | ORAL_TABLET | Freq: Four times a day (QID) | ORAL | Status: DC | PRN

## 2013-04-25 MED ORDER — IPRATROPIUM BROMIDE 0.02 % IN SOLN
0.5000 mg | Freq: Four times a day (QID) | RESPIRATORY_TRACT | Status: DC
  Administered 2013-04-25 – 2013-04-29 (×14): 0.5 mg via RESPIRATORY_TRACT
  Filled 2013-04-25 (×13): qty 2.5

## 2013-04-25 MED ORDER — LEVOFLOXACIN IN D5W 750 MG/150ML IV SOLN
750.0000 mg | INTRAVENOUS | Status: DC
  Administered 2013-04-25 – 2013-04-26 (×2): 750 mg via INTRAVENOUS
  Filled 2013-04-25 (×2): qty 150

## 2013-04-25 MED ORDER — DORZOLAMIDE HCL 2 % OP SOLN
1.0000 [drp] | Freq: Two times a day (BID) | OPHTHALMIC | Status: DC
  Administered 2013-04-25 – 2013-04-29 (×9): 1 [drp] via OPHTHALMIC
  Filled 2013-04-25: qty 10

## 2013-04-25 MED ORDER — HEPARIN SODIUM (PORCINE) 5000 UNIT/ML IJ SOLN
5000.0000 [IU] | Freq: Three times a day (TID) | INTRAMUSCULAR | Status: DC
Start: 2013-04-25 — End: 2013-04-29
  Administered 2013-04-25 – 2013-04-29 (×12): 5000 [IU] via SUBCUTANEOUS
  Filled 2013-04-25 (×15): qty 1

## 2013-04-25 MED ORDER — OCUVITE PO TABS
1.0000 | ORAL_TABLET | Freq: Every day | ORAL | Status: DC
  Administered 2013-04-25 – 2013-04-29 (×5): 1 via ORAL
  Filled 2013-04-25 (×5): qty 1

## 2013-04-25 MED ORDER — IOHEXOL 350 MG/ML SOLN
100.0000 mL | Freq: Once | INTRAVENOUS | Status: AC | PRN
  Administered 2013-04-25: 100 mL via INTRAVENOUS

## 2013-04-25 MED ORDER — ATENOLOL 50 MG PO TABS
50.0000 mg | ORAL_TABLET | Freq: Every day | ORAL | Status: DC
  Administered 2013-04-25 – 2013-04-29 (×5): 50 mg via ORAL
  Filled 2013-04-25: qty 2
  Filled 2013-04-25 (×4): qty 1

## 2013-04-25 MED ORDER — MORPHINE SULFATE 2 MG/ML IJ SOLN: 1.0000 mg | INTRAMUSCULAR | Status: DC | PRN

## 2013-04-25 NOTE — H&P (Signed)
[facility] History and Physical  Stacie Wright ZOX:[id number] DOB: 29-Apr-1928 DOA: 04/25/2013  Referring physician: Judd Lien PCP: Fredirick Maudlin, MD  Specialists:  Chief Complaint: Cough and shortness of breath  HPI: Stacie Wright is a 79 y.o. female with aspirin history of? COPD and hypertension, she is nonsmoker. Patient was presented to her primary care physician with cough, sputum production, shortness of breath and generalized weakness about 3 weeks ago, she was diagnosed with pneumonia and started on an unknown antibiotic, per her husband she took the antibiotics for 2 weeks but she is still complaining about the same symptoms. As a matter of fact for the past week the symptoms is worsening so she went back to her primary care physician who prescribed prednisone this time. Patient came into the ED today because of worsening of her symptoms. Chest x-ray, showed bronchitic changes, patient was hypoxic with ambulation end date is per ED physician note.  Review of Systems:  Constitutional: negative for anorexia, fevers and sweats Eyes: negative for irritation, redness and visual disturbance Ears, nose, mouth, throat, and face: negative for earaches, epistaxis, nasal congestion and sore throat Respiratory: Per history of present illness, has SOB, sputum production, cough but denies wheezes. Cardiovascular: negative for chest pain, dyspnea, lower extremity edema, orthopnea, palpitations and syncope Gastrointestinal: negative for abdominal pain, constipation, diarrhea, melena, nausea and vomiting Genitourinary:negative for dysuria, frequency and hematuria Hematologic/lymphatic: negative for bleeding, easy bruising and lymphadenopathy Musculoskeletal:negative for arthralgias, muscle weakness and stiff joints Neurological: negative for coordination problems, gait problems, headaches and weakness Endocrine: negative for diabetic symptoms including polydipsia, polyuria and weight  loss Allergic/Immunologic: negative for anaphylaxis, hay fever and urticaria   Past Medical History  Diagnosis Date  . Hypertension   . COPD (chronic obstructive pulmonary disease)    History reviewed. No pertinent past surgical history. Social History:  reports that she does not drink alcohol. Her tobacco and drug histories are not on file. Lives with husband at home.  Allergies  Allergen Reactions  . Amoxicillin Anaphylaxis and Rash  . Penicillins Anaphylaxis and Rash  . Sulfa Antibiotics Anaphylaxis and Rash  . Darvocet (Propoxyphene-Acetaminophen)     hallucinations    Family History  Problem Relation Age of Onset  . Diabetes Father   . Hypertension Father     Prior to Admission medications   Medication Sig Start Date End Date Taking? Authorizing Provider  allopurinol (ZYLOPRIM) 300 MG tablet Take 300 mg by mouth daily.   Yes Historical Provider, MD  aspirin EC 81 MG tablet Take 81 mg by mouth daily.   Yes Historical Provider, MD  atenolol (TENORMIN) 50 MG tablet Take 50 mg by mouth daily.   Yes Historical Provider, MD  beta carotene w/minerals (OCUVITE) tablet Take 1 tablet by mouth daily.   Yes Historical Provider, MD  brimonidine-timolol (COMBIGAN) 0.2-0.5 % ophthalmic solution Place 1 drop into both eyes every 12 (twelve) hours.   Yes Historical Provider, MD  dorzolamide (TRUSOPT) 2 % ophthalmic solution Place 1 drop into both eyes 2 (two) times daily.   Yes Historical Provider, MD  hyoscyamine (LEVBID) 0.375 MG 12 hr tablet Take 0.1875 mg by mouth every 12 (twelve) hours as needed for cramping.   Yes Historical Provider, MD  latanoprost (XALATAN) 0.005 % ophthalmic solution Place 1 drop into both eyes at bedtime.   Yes Historical Provider, MD  PARoxetine (PAXIL) 20 MG tablet Take 20 mg by mouth every morning.   Yes Historical Provider, MD  predniSONE (DELTASONE) 5 MG  tablet Take 5-10 mg by mouth daily. Taper dose.   Pt is currently taking 2 tablet (10mg ) for 3 days  total.   1 more day of 2 tablets to be taken.  Then take 1 tablet (5mg ) for 3 days.  Then stop   Yes Historical Provider, MD  triamterene-hydrochlorothiazide (MAXZIDE-25) 37.5-25 MG per tablet Take 1 tablet by mouth every other day.   Yes Historical Provider, MD   Physical Exam: Filed Vitals:   04/25/13 1118  BP: 129/94  Pulse: 67  Temp:   Resp: 22  General appearance: alert, cooperative and no distress  Head: Normocephalic, without obvious abnormality, atraumatic  Eyes: conjunctivae/corneas clear. PERRL, EOM's intact. Fundi benign.  Nose: Nares normal. Septum midline. Mucosa normal. No drainage or sinus tenderness.  Throat: lips, mucosa, and tongue normal; teeth and gums normal  Neck: Supple, no masses, no cervical lymphadenopathy, no JVD appreciated, no meningeal signs Resp: Decreased air entry bilaterally. Chest wall: no tenderness  Cardio: regular rate and rhythm, S1, S2 normal, no murmur, click, rub or gallop  GI: soft, non-tender; bowel sounds normal; no masses, no organomegaly  Extremities: extremities normal, atraumatic, no cyanosis or edema  Skin: Skin color, texture, turgor normal. No rashes or lesions  Neurologic: Alert and oriented X 3, normal strength and tone. Normal symmetric reflexes. Normal coordination and gait  Labs on Admission:  Basic Metabolic Panel:  Recent Labs Lab 04/25/13 0815  NA 131*  K 4.3  CL 92*  CO2 29  GLUCOSE 103*  BUN 22  CREATININE 0.78  CALCIUM 9.4   Liver Function Tests:  Recent Labs Lab 04/25/13 0815  AST 22  ALT 22  ALKPHOS 66  BILITOT 0.4  PROT 7.2  ALBUMIN 3.1*   No results found for this basename: LIPASE, AMYLASE,  in the last 168 hours No results found for this basename: AMMONIA,  in the last 168 hours CBC:  Recent Labs Lab 04/25/13 0815  WBC 17.9*  NEUTROABS 14.6*  HGB 14.1  HCT 39.9  MCV 92.6  PLT 269   Cardiac Enzymes:  Recent Labs Lab 04/25/13 0815  TROPONINI <0.30    BNP (last 3  results)  Recent Labs  04/25/13 0815  PROBNP 456.8*   CBG: No results found for this basename: GLUCAP,  in the last 168 hours  Radiological Exams on Admission: Dg Chest 2 View  04/25/2013   *RADIOLOGY REPORT*  Clinical Data: Productive cough, weakness, shortness of breath  CHEST - 2 VIEW  Comparison: Chest radiograph 03/28/2013, 03/12/2012, and 07/01/2010  Findings:   The heart, mediastinal, hilar contours are stable.  Lung volumes appear normal.  There is extensive abnormal peribronchial thickening that appears similar to a chest radiograph of July 2014 and progressive since the chest radiographs of June 2013 and July 2011.  Patchy airspace opacities in the peripheral right upper and lower lung and the left mid lung appears similar to chest radiograph of 18-1013 and March 28, 2013.  Negative for pleural effusion.  No acute bony abnormality.  IMPRESSION: Extensive abnormal peribronchial thickening and persistent, chronic appearing focal airspace opacities.  Potential considerations include chronic infections eosinophilic pneumonia, bronchiolitis obliterans organizing pneumonia, drug-induced lung disease, or chronic mycobacterium avium intracelluare infection.  Given the chronicity of these findings and the patient's continued symptoms, further evaluation with chest CT could be considered.  This could be performed without intravenous contrast.   Original Report Authenticated By: Britta Mccreedy, M.D.    EKG: Independently reviewed.  Assessment/Plan Principal Problem:  Acute bronchitis Active Problems:   Abnormal chest x-ray   Hypertension   Generalized weakness   Hypoxia   SOB/hypoxia -Likely secondary to bronchitis, chest x-ray showed chronic appearing focal airspace opacities. -CT angiography of the chest for better visualization. -Depending on the results of CT scan, and consult pulmonology if needed. -Discontinue the prednisone as patient is not wheezing.  Acute bronchitis -Started  on Levaquin, patient was been on antibiotic but she did not remember it. -Supportive management with oxygen as needed, mucolytics, bronchodilators and antitussives.  Hypertension -Continue prednisone medications.  Generalized weakness -Secondary to her respiratory symptoms.  Leukocytosis -Likely secondary to respiratory infection, patient was on steroids and can cause leukocytosis. -Leukocytosis on this presentation less likely to be secondary to steroids because of presence of eosinophils  Code Status: Full code Family Communication: Patient husband at bedside, plan discussed with both. Disposition Plan: Inpatient, telemetry, anticipate length of stay to be greater than 2 midnights  Time spent: 70 minutes  [facility] Pager [phone]  If 7PM-7AM, please contact night-coverage [email or web address] Password [facility] 04/25/2013, 1:49 PM

## 2013-04-25 NOTE — Progress Notes (Signed)
Stacie Wright [id number] Admission Data: 04/25/2013 5:23 PM Attending Provider: Clydia Llano, MD  XBM:[id number],WNUUVO L, MD Consults/ Treatment Team:    Stacie Wright is a 79 y.o. female patient admitted from ED awake, alert  & orientated  X 3,  Full Code, VSS - Blood pressure 222/93, pulse 71, temperature 97.9 F (36.6 C), temperature source Oral, resp. rate 20, height 5\' 8"  (1.727 m), weight 73.2 kg (161 lb 6 oz), SpO2 94.00%.,  no c/o shortness of breath, no c/o chest pain, no distress noted.  IV site WDL:  forearm left, condition patent and no redness with a transparent dsg that's clean dry and intact.  Allergies:   Allergies  Allergen Reactions  . Amoxicillin Anaphylaxis and Rash  . Penicillins Anaphylaxis and Rash  . Sulfa Antibiotics Anaphylaxis and Rash  . Darvocet (Propoxyphene-Acetaminophen)     hallucinations     Past Medical History  Diagnosis Date  . Hypertension   . COPD (chronic obstructive pulmonary disease)   . Anxiety   . Shortness of breath   . Pneumonia 04/2013  . Headache(784.0)   . Arthritis     History:  obtained from the patient. Tobacco/alcohol: denied   Pt orientation to unit, room and routine. Information packet given to patient/family and safety video watched.  Admission INP armband ID verified with patient/family, and in place. SR up x 2, fall risk assessment complete with Patient and family verbalizing understanding of risks associated with falls. Pt verbalizes an understanding of how to use the call bell and to call for help before getting out of bed.  Skin, clean-dry- intact without evidence of bruising, or skin tears.   No evidence of skin break down noted on exam.     Will cont to monitor and assist as needed.  Cindra Eves, RN 04/25/2013 5:23 PM

## 2013-04-25 NOTE — ED Provider Notes (Addendum)
CSN: [id number]     Arrival date & time 04/25/13  0735 History     First MD Initiated Contact with Patient 04/25/13 0741     No chief complaint on file.  (Consider location/radiation/quality/duration/timing/severity/associated sxs/prior Treatment) HPI Comments: Patient with past medical history significant for mild COPD, hypertension. She presents today with complaints of weakness which has been progressing for the past month. For the past 2 weeks she has had a productive cough and feels short of breath. The sputum is green in color but is not streaked with blood. She denies any chest pain, fevers, chills, or urinary complaints.  She was seen by her primary doctor one week ago And had blood work and a chest x-ray performed. These were both unremarkable. She was treated with an antibiotic and prednisone for possible pneumonia, however is not improving. Her primary care physician is Dr. Juanetta Gosling of [place].  Patient is a 79 y.o. female presenting with weakness. The history is provided by the patient.  Weakness This is a new problem. Episode onset: one month. The problem occurs constantly. The problem has been gradually worsening. Associated symptoms include shortness of breath. Pertinent negatives include no chest pain. Exacerbated by: activity, ambulation. Nothing relieves the symptoms. Treatments tried: prednisone, antibiotics. The treatment provided no relief.    No past medical history on file. No past surgical history on file. No family history on file. History  Substance Use Topics  . Smoking status: Not on file  . Smokeless tobacco: Not on file  . Alcohol Use: Not on file   OB History   No data available     Review of Systems  Constitutional: Positive for activity change, appetite change and fatigue. Negative for fever and chills.  Respiratory: Positive for cough and shortness of breath. Negative for wheezing.   Cardiovascular: Negative for chest pain and leg swelling.   Neurological: Positive for weakness.  All other systems reviewed and are negative.    Allergies  Review of patient's allergies indicates not on file.  Home Medications  No current outpatient prescriptions on file. BP 172/83  Pulse 67  Temp(Src) 98.2 F (36.8 C) (Oral)  Resp 18  SpO2 96% Physical Exam  Nursing note and vitals reviewed. Constitutional: She is oriented to person, place, and time. She appears well-developed and well-nourished. No distress.  Patient is an elderly female in no acute distress.  HENT:  Head: Normocephalic and atraumatic.  Mouth/Throat: Oropharynx is clear and moist.  Eyes: Pupils are equal, round, and reactive to light.  Neck: Normal range of motion. Neck supple.  Cardiovascular: Normal rate and regular rhythm.  Exam reveals no gallop and no friction rub.   No murmur heard. Pulmonary/Chest: Effort normal. No respiratory distress.  There are scattered rhonchi bilaterally.  Abdominal: Soft. Bowel sounds are normal. She exhibits no distension. There is no tenderness.  Musculoskeletal: Normal range of motion. She exhibits no edema and no tenderness.  Lymphadenopathy:    She has no cervical adenopathy.  Neurological: She is alert and oriented to person, place, and time. No cranial nerve deficit.  Skin: Skin is warm and dry. She is not diaphoretic.    ED Course   Procedures (including critical care time)  Labs Reviewed  CBC WITH DIFFERENTIAL  COMPREHENSIVE METABOLIC PANEL  URINALYSIS, ROUTINE W REFLEX MICROSCOPIC  PRO B NATRIURETIC PEPTIDE  TROPONIN I   No results found. No diagnosis found.   Date: 04/25/2013  Rate: 70  Rhythm: normal sinus rhythm  QRS Axis: normal  Intervals: normal  ST/T Wave abnormalities: nonspecific T wave changes  Conduction Disutrbances:none  Narrative Interpretation:   Old EKG Reviewed: none available    MDM  The workup today reveals an elevated white cell count along with evidence for pneumonia on the  chest x-ray. Her oxygen saturations at rest are in the 90-91% range and dropped into the 80s with ambulation. She will be treated with Levaquin and ceftriaxone and I have discussed the situation with Dr. Arthor Captain from [facility]. He agrees to admit the patient to his service.    Geoffery Lyons, MD 04/25/13 1037  Geoffery Lyons, MD 04/25/13 1038

## 2013-04-25 NOTE — ED Notes (Signed)
Pt has been taking antibx. For PNA as out PT.

## 2013-04-26 DIAGNOSIS — J209 Acute bronchitis, unspecified: Secondary | ICD-10-CM

## 2013-04-26 DIAGNOSIS — R0902 Hypoxemia: Secondary | ICD-10-CM

## 2013-04-26 DIAGNOSIS — R911 Solitary pulmonary nodule: Secondary | ICD-10-CM

## 2013-04-26 DIAGNOSIS — J479 Bronchiectasis, uncomplicated: Secondary | ICD-10-CM

## 2013-04-26 DIAGNOSIS — J189 Pneumonia, unspecified organism: Secondary | ICD-10-CM

## 2013-04-26 DIAGNOSIS — J441 Chronic obstructive pulmonary disease with (acute) exacerbation: Secondary | ICD-10-CM

## 2013-04-26 LAB — CBC
MCV: 91.7 fL (ref 78.0–100.0)
Platelets: 240 10*3/uL (ref 150–400)
RDW: 14.3 % (ref 11.5–15.5)
WBC: 14.8 10*3/uL — ABNORMAL HIGH (ref 4.0–10.5)

## 2013-04-26 LAB — LEGIONELLA ANTIGEN, URINE: Legionella Antigen, Urine: NEGATIVE

## 2013-04-26 LAB — BASIC METABOLIC PANEL
Calcium: 9 mg/dL (ref 8.4–10.5)
Creatinine, Ser: 0.8 mg/dL (ref 0.50–1.10)
GFR calc Af Amer: 77 mL/min — ABNORMAL LOW (ref >90)

## 2013-04-26 LAB — EXPECTORATED SPUTUM ASSESSMENT W GRAM STAIN, RFLX TO RESP C

## 2013-04-26 MED ORDER — TIMOLOL MALEATE 0.5 % OP SOLN
1.0000 [drp] | Freq: Two times a day (BID) | OPHTHALMIC | Status: DC
  Administered 2013-04-26 – 2013-04-29 (×6): 1 [drp] via OPHTHALMIC
  Filled 2013-04-26: qty 5

## 2013-04-26 MED ORDER — HYDROCOD POLST-CHLORPHEN POLST 10-8 MG/5ML PO LQCR: 5.0000 mL | Freq: Two times a day (BID) | ORAL | Status: DC | PRN

## 2013-04-26 MED ORDER — HYDROCOD POLST-CHLORPHEN POLST 10-8 MG/5ML PO LQCR
5.0000 mL | Freq: Once | ORAL | Status: AC
  Administered 2013-04-26: 5 mL via ORAL
  Filled 2013-04-26: qty 5

## 2013-04-26 MED ORDER — BRIMONIDINE TARTRATE-TIMOLOL 0.2-0.5 % OP SOLN: 1.0000 [drp] | Freq: Two times a day (BID) | OPHTHALMIC | Status: DC

## 2013-04-26 MED ORDER — VANCOMYCIN HCL IN DEXTROSE 750-5 MG/150ML-% IV SOLN
750.0000 mg | Freq: Two times a day (BID) | INTRAVENOUS | Status: DC
  Administered 2013-04-27 – 2013-04-28 (×3): 750 mg via INTRAVENOUS
  Filled 2013-04-26 (×4): qty 150

## 2013-04-26 MED ORDER — METHYLPREDNISOLONE SODIUM SUCC 40 MG IJ SOLR
40.0000 mg | Freq: Three times a day (TID) | INTRAMUSCULAR | Status: DC
  Administered 2013-04-26 – 2013-04-28 (×6): 40 mg via INTRAVENOUS
  Filled 2013-04-26 (×9): qty 1

## 2013-04-26 MED ORDER — BRIMONIDINE TARTRATE 0.2 % OP SOLN
1.0000 [drp] | Freq: Two times a day (BID) | OPHTHALMIC | Status: DC
  Administered 2013-04-26 – 2013-04-29 (×7): 1 [drp] via OPHTHALMIC
  Filled 2013-04-26: qty 5

## 2013-04-26 MED ORDER — VANCOMYCIN HCL IN DEXTROSE 1-5 GM/200ML-% IV SOLN
1000.0000 mg | Freq: Once | INTRAVENOUS | Status: AC
  Administered 2013-04-26: 1000 mg via INTRAVENOUS
  Filled 2013-04-26 (×2): qty 200

## 2013-04-26 MED ORDER — DEXTROSE 5 % IV SOLN
1.0000 g | Freq: Three times a day (TID) | INTRAVENOUS | Status: DC
  Administered 2013-04-26 – 2013-04-28 (×5): 1 g via INTRAVENOUS
  Filled 2013-04-26 (×7): qty 1

## 2013-04-26 MED ORDER — BENZONATATE 100 MG PO CAPS
100.0000 mg | ORAL_CAPSULE | Freq: Three times a day (TID) | ORAL | Status: DC | PRN
  Administered 2013-04-26 – 2013-04-28 (×2): 100 mg via ORAL
  Filled 2013-04-26 (×3): qty 1

## 2013-04-26 MED ORDER — POLYETHYLENE GLYCOL 3350 17 G PO PACK
17.0000 g | PACK | Freq: Every day | ORAL | Status: DC
  Administered 2013-04-26 – 2013-04-29 (×4): 17 g via ORAL
  Filled 2013-04-26 (×4): qty 1

## 2013-04-26 NOTE — Progress Notes (Signed)
ANTIBIOTIC CONSULT NOTE - INITIAL  Pharmacy Consult for Vancomycin and Aztreonam Indication: R/O HCAP  Allergies  Allergen Reactions  . Amoxicillin Anaphylaxis and Rash  . Penicillins Anaphylaxis and Rash  . Sulfa Antibiotics Anaphylaxis and Rash  . Darvocet (Propoxyphene-Acetaminophen)     hallucinations    Patient Measurements: Height: 5\' 8"  (172.7 cm) Weight: 161 lb 6 oz (73.2 kg) IBW/kg (Calculated) : 63.9 Adjusted Body Weight:   Vital Signs: Temp: 98.8 F (37.1 C) (08/12 1525) Temp src: Oral (08/12 1525) BP: 129/69 mmHg (08/12 1525) Pulse Rate: 76 (08/12 1525) Intake/Output from previous day: 08/11 0701 - 08/12 0700 In: 290 [I.V.:140; IV Piggyback:150] Out: 975 [Urine:975] Intake/Output from this shift: Total I/O In: 240 [P.O.:240] Out: -   Labs:  Recent Labs  04/25/13 0815 04/26/13 0557  WBC 17.9* 14.8*  HGB 14.1 14.1  PLT 269 240  CREATININE 0.78 0.80   Estimated Creatinine Clearance: 53.7 ml/min (by C-G formula based on Cr of 0.8). No results found for this basename: VANCOTROUGH, VANCOPEAK, VANCORANDOM, GENTTROUGH, GENTPEAK, GENTRANDOM, TOBRATROUGH, TOBRAPEAK, TOBRARND, AMIKACINPEAK, AMIKACINTROU, AMIKACIN,  in the last 72 hours   Microbiology: No results found for this or any previous visit (from the past 720 hour(s)).  Medical History: Past Medical History  Diagnosis Date  . Hypertension   . COPD (chronic obstructive pulmonary disease)   . Anxiety   . Shortness of breath   . Pneumonia 04/2013  . Headache(784.0)   . Arthritis     Medications:  Prescriptions prior to admission  Medication Sig Dispense Refill  . allopurinol (ZYLOPRIM) 300 MG tablet Take 300 mg by mouth daily.      Marland Kitchen aspirin EC 81 MG tablet Take 81 mg by mouth daily.      Marland Kitchen atenolol (TENORMIN) 50 MG tablet Take 50 mg by mouth daily.      . beta carotene w/minerals (OCUVITE) tablet Take 1 tablet by mouth daily.      . brimonidine-timolol (COMBIGAN) 0.2-0.5 % ophthalmic  solution Place 1 drop into both eyes every 12 (twelve) hours.      . dorzolamide (TRUSOPT) 2 % ophthalmic solution Place 1 drop into both eyes 2 (two) times daily.      . hyoscyamine (LEVBID) 0.375 MG 12 hr tablet Take 0.1875 mg by mouth every 12 (twelve) hours as needed for cramping.      . latanoprost (XALATAN) 0.005 % ophthalmic solution Place 1 drop into both eyes at bedtime.      Marland Kitchen PARoxetine (PAXIL) 20 MG tablet Take 20 mg by mouth every morning.      . predniSONE (DELTASONE) 5 MG tablet Take 5-10 mg by mouth daily. Taper dose.   Pt is currently taking 2 tablet (10mg ) for 3 days total.   1 more day of 2 tablets to be taken.  Then take 1 tablet (5mg ) for 3 days.  Then stop      . triamterene-hydrochlorothiazide (MAXZIDE-25) 37.5-25 MG per tablet Take 1 tablet by mouth every other day.       Assessment: 85yof admitted with cough and SOB to broaden antibiotics from Levaquin to Vancomycin + Aztreonam (anaphylactic reaction listed to PCN) for r/o HCAP. Patient is currently afebrile and WBC ~14.8. ID has been consulted. - CrCl 54  Goal of Therapy:  Vancomycin trough level 15-20 mcg/ml  Plan:  1. Vancomycin 1g IV x1, then Vancomycin 750mg  IV q12h 2. Aztreonam 1g IV q8h 3. Monitor renal function, cultures, clinical course and order Vancomycin trough at [facility]  Cleon Dew [phone] 04/26/2013,5:39 PM

## 2013-04-26 NOTE — Consult Note (Signed)
PULMONARY  / CRITICAL CARE MEDICINE  Name: RYANNE MORAND MRN: [id number] DOB: [birth year]    ADMISSION DATE:  04/25/2013 CONSULTATION DATE:  04/26/2013  REFERRING MD :  EDP PRIMARY SERVICE:  PCCM  CHIEF COMPLAINT:  Fever, chills and tired.  BRIEF PATIENT DESCRIPTION: 79 year old female with PMH of COPD and recurrent pneumonia presenting to the hospital after an entire month of treatment for pneumonia.  Presents to the hospital with SOB.  Patient was admitted to [facility], CTA was done that revealed no PE but multi-focal PNA and bronchiectasis as well as a pulmonary nodule.  PCCM was called to evaluate the PNA and pulmonary nodule.  SIGNIFICANT EVENTS / STUDIES:  8/11 CT of the chest, negative for PE, multifocal PNA, mediastinal LAN and LLL nodule 1.5 ml.  LINES / TUBES: PIV  CULTURES: None  ANTIBIOTICS: Levofloxacin 8/11>>>  PAST MEDICAL HISTORY :  Past Medical History  Diagnosis Date  . Hypertension   . COPD (chronic obstructive pulmonary disease)   . Anxiety   . Shortness of breath   . Pneumonia 04/2013  . Headache(784.0)   . Arthritis    Past Surgical History  Procedure Laterality Date  . Abdominal hysterectomy     Prior to Admission medications   Medication Sig Start Date End Date Taking? Authorizing Provider  allopurinol (ZYLOPRIM) 300 MG tablet Take 300 mg by mouth daily.   Yes Historical Provider, MD  aspirin EC 81 MG tablet Take 81 mg by mouth daily.   Yes Historical Provider, MD  atenolol (TENORMIN) 50 MG tablet Take 50 mg by mouth daily.   Yes Historical Provider, MD  beta carotene w/minerals (OCUVITE) tablet Take 1 tablet by mouth daily.   Yes Historical Provider, MD  brimonidine-timolol (COMBIGAN) 0.2-0.5 % ophthalmic solution Place 1 drop into both eyes every 12 (twelve) hours.   Yes Historical Provider, MD  dorzolamide (TRUSOPT) 2 % ophthalmic solution Place 1 drop into both eyes 2 (two) times daily.   Yes Historical Provider, MD  hyoscyamine (LEVBID) 0.375 MG  12 hr tablet Take 0.1875 mg by mouth every 12 (twelve) hours as needed for cramping.   Yes Historical Provider, MD  latanoprost (XALATAN) 0.005 % ophthalmic solution Place 1 drop into both eyes at bedtime.   Yes Historical Provider, MD  PARoxetine (PAXIL) 20 MG tablet Take 20 mg by mouth every morning.   Yes Historical Provider, MD  predniSONE (DELTASONE) 5 MG tablet Take 5-10 mg by mouth daily. Taper dose.   Pt is currently taking 2 tablet (10mg ) for 3 days total.   1 more day of 2 tablets to be taken.  Then take 1 tablet (5mg ) for 3 days.  Then stop   Yes Historical Provider, MD  triamterene-hydrochlorothiazide (MAXZIDE-25) 37.5-25 MG per tablet Take 1 tablet by mouth every other day.   Yes Historical Provider, MD   Allergies  Allergen Reactions  . Amoxicillin Anaphylaxis and Rash  . Penicillins Anaphylaxis and Rash  . Sulfa Antibiotics Anaphylaxis and Rash  . Darvocet (Propoxyphene-Acetaminophen)     hallucinations    FAMILY HISTORY:  Family History  Problem Relation Age of Onset  . Diabetes Father   . Hypertension Father    SOCIAL HISTORY:  reports that she has never smoked. She has never used smokeless tobacco. She reports that she does not drink alcohol or use illicit drugs.  REVIEW OF SYSTEMS:   Constitutional: Negative for fever, chills, weight loss, malaise/fatigue and diaphoresis.  HENT: Negative for hearing loss, ear pain,  nosebleeds, congestion, sore throat, neck pain, tinnitus and ear discharge.   Eyes: Negative for blurred vision, double vision, photophobia, pain, discharge and redness.  Respiratory: Negative for cough, hemoptysis, sputum production, shortness of breath, wheezing and stridor.   Cardiovascular: Negative for chest pain, palpitations, orthopnea, claudication, leg swelling and PND.  Gastrointestinal: Negative for heartburn, nausea, vomiting, abdominal pain, diarrhea, constipation, blood in stool and melena.  Genitourinary: Negative for dysuria, urgency,  frequency, hematuria and flank pain.  Musculoskeletal: Negative for myalgias, back pain, joint pain and falls.  Skin: Negative for itching and rash.  Neurological: Negative for dizziness, tingling, tremors, sensory change, speech change, focal weakness, seizures, loss of consciousness, weakness and headaches.  Endo/Heme/Allergies: Negative for environmental allergies and polydipsia. Does not bruise/bleed easily.  SUBJECTIVE:   VITAL SIGNS: Temp:  [97.7 F (36.5 C)-98.7 F (37.1 C)] 97.7 F (36.5 C) (08/12 0510) Pulse Rate:  [70-71] 71 (08/12 0510) Resp:  [17-20] 18 (08/12 0510) BP: (151-224)/(71-100) 151/76 mmHg (08/12 0510) SpO2:  [91 %-95 %] 93 % (08/12 0735) Weight:  [73.2 kg (161 lb 6 oz)] 73.2 kg (161 lb 6 oz) (08/11 1525)  PHYSICAL EXAMINATION: General:  Chronically ill appearing elderly female, NAD. Neuro:  Alert and interactive, moving all ext to command. HEENT:  [place]/AT, PERRL, EOM-I and MMM. Neck:  Supple, -LAN and -thyromegally.  Neg JVD. Cardiovascular:  RRR, Nl S1/S2, -M/R/G. Lungs:  Diffuse rales and end exp wheezes, crackles at the bases. Abdomen:  Soft, NT, ND and +BS. Musculoskeletal:  -edema and -tenderness. Skin:  Thin but intact.   Recent Labs Lab 04/25/13 0815 04/26/13 0557  NA 131* 127*  K 4.3 3.8  CL 92* 89*  CO2 29 24  BUN 22 19  CREATININE 0.78 0.80  GLUCOSE 103* 93    Recent Labs Lab 04/25/13 0815 04/26/13 0557  HGB 14.1 14.1  HCT 39.9 40.0  WBC 17.9* 14.8*  PLT 269 240   Dg Chest 2 View  04/25/2013   *RADIOLOGY REPORT*  Clinical Data: Productive cough, weakness, shortness of breath  CHEST - 2 VIEW  Comparison: Chest radiograph 03/28/2013, 03/12/2012, and 07/01/2010  Findings:   The heart, mediastinal, hilar contours are stable.  Lung volumes appear normal.  There is extensive abnormal peribronchial thickening that appears similar to a chest radiograph of July 2014 and progressive since the chest radiographs of June 2013 and July 2011.   Patchy airspace opacities in the peripheral right upper and lower lung and the left mid lung appears similar to chest radiograph of 18-1013 and March 28, 2013.  Negative for pleural effusion.  No acute bony abnormality.  IMPRESSION: Extensive abnormal peribronchial thickening and persistent, chronic appearing focal airspace opacities.  Potential considerations include chronic infections eosinophilic pneumonia, bronchiolitis obliterans organizing pneumonia, drug-induced lung disease, or chronic mycobacterium avium intracelluare infection.  Given the chronicity of these findings and the patient's continued symptoms, further evaluation with chest CT could be considered.  This could be performed without intravenous contrast.   Original Report Authenticated By: Britta Mccreedy, M.D.   Ct Angio Chest Pe W/cm &/or Wo Cm  04/25/2013   *RADIOLOGY REPORT*  Clinical Data: Cough, sputum production, shortness of breath, generalized weakness, history of COPD, evaluate for pulmonary embolism  CT ANGIOGRAPHY CHEST  Technique:  Multidetector CT imaging of the chest using the standard protocol during bolus administration of intravenous contrast. Multiplanar reconstructed images including MIPs were obtained and reviewed to evaluate the vascular anatomy.  Contrast: OMNIPAQUE IOHEXOL 350 MG/ML SOLN  Comparison:  Chest radiograph - earlier same day; 03/28/2013  Vascular Findings:  There is adequate opacification of the pulmonary arterial system of the main pulmonary artery measuring 524 HU.  There are no discrete filling defects within the pulmonary arterial tree to suggest pulmonary embolism.  Normal caliber of the main pulmonary artery.  Normal heart size.  Calcifications within the mitral valve annulus. Normal caliber of the thoracic aorta.  Conventional configuration of the aortic arch.  The major branch vessels of the aortic arch widely patent.  Incidental note is made of a ductus diverticulum. No thoracic aortic dissection or  periaortic stranding.  -----------------------------------------------------------  Nonvascular findings:  There are consolidative airspace opacities primarily involving the inferior segment of the lingula and to a lesser extent, the lateral segment of the right middle lobe with associated air bronchograms and peripheral areas of geographic bronchiectasis.  There are minimal consolidative opacities within the right costophrenic angle and subpleural aspect of the right upper lobe (image 56, series six).  There are extensive basilar predominant tree in bud opacities within the bilateral lower lobes with scattered areas of geographic bronchiectasis.  The pulmonary airways are patent.  Note is made of this slight mosaic attenuation of the pulmonary parenchyma.  There is an indeterminate approximate 1.5 x 1.4 cm nodule within the left lower lobe (image 85, series six).  There is mediastinal and right hilar lymphadenopathy with index precarinal node conglomeration measuring approximately 1.1 cm and a short axis diameter (image 50 for, series four) and index right hilar partially calcified node conglomeration measuring approximately 1.6 cm (image 66).   Scattered additional prevascular and left hilar lymph nodes, not enlarged by CT criteria.  No axillary lymphadenopathy.  Early arterial phase evaluation of the upper abdomen demonstrates a hypoattenuating sub centimeter (approximately 0.6 cm) lesion within the dome of the left lobe of the liver (image 125, series 4) too small to accurate characterize lobe possibly representing a hepatic cyst.  Post cholecystectomy.  There is mild diffuse thickening of bilateral adrenal gland without discrete nodule.  No acute or aggressive osseous abnormalities.  Normal appearance of the thyroid gland.  IMPRESSION:  1.  Negative for pulmonary embolism. 2.  Overall findings worrisome for multifocal infection, worse within the lingula and right middle lobe, with associated consolidative  opacities and areas of bronchiectasis - as such, atypical etiologies (such as MAC) should be considered. 3.  Extensive basilar predominant tree in bud opacities are worrisome for airways disseminated infection and/or aspiration.  4. Slight mosaic attenuation of the pulmonary parenchyma is suggestive of airways disease.  5.  Nonspecific mediastinal and right hilar lymphadenopathy, presumably reactive in etiology.  6.  Indeterminate approximately 1.5 cm nodule within the left lower lobe.  While possibly a discrete nodular area of infection, an underlying nodule is not excluded and as such, a follow-up chest CT in 6 weeks after treatment is recommended for further evaluation.   Original Report Authenticated By: Tacey Ruiz, MD    ASSESSMENT / PLAN:  79 year old female with COPD who presents with SOB and "pneumonia".  CT is certainly consistent with multi-lobar PNA.  However, there is also bronchiectasis, mediastinal LAN, centrilobular emphysema and a pulmonary nodule.  Plan: - Check respiratory viral panel given the presence of tree-in-bud on CT. - Repeat culture. - Would recommend broadening abx to include HCAP given the fact that patient had been on other abx within the past 3 months. - D/C levaquin. - Start solumedrol at 40 mg IV q8 hours with  a taper quickly once patient starts improving. - Would consider adding pseudomonas coverage but will defer to ID given bronchiectasis. - Continue duonebs and PRN albuterol. - Physical therapy and early mobilization would be of great benefit in this case. - IS per RT protocol. - With regards to pulmonary nodule, will need repeat CT of the chest in 6-8 wks after treatment of PNA since not sure if it is truly a pulmonary nodule or a PNA. - No indication for bronchoscopy at this time unless ID feels expectorated sputum is inadequate. - Will defer to ID the suspicion for MAC or MAI, but in my opinion treatment has not been adequate for HCAP. - PCCM will  continue to follow with you.  Alyson Reedy, M.D. Pulmonary and Critical Care Medicine [facility] Pager: [phone]  04/26/2013, 1:23 PM

## 2013-04-26 NOTE — Progress Notes (Signed)
PATIENT DETAILS Name: Stacie Wright Age: 79 y.o. Sex: female Date of Birth: 01-29-1928 Admit Date: 04/25/2013 Admitting Physician Clydia Llano, MD ZOX:[id number],[id number] L, MD  Subjective: Patient is an 79 yo female with a 1 month history of productive cough and SOB.  She is a never smoker.  Chest CT is negative for PE, positive for infection which is possibly multifocal. She also has a nodule in her left lower lobe that needs to be re-checked after treatment. She still has a cough productive of purulent sputum.  Her symptoms have worsened over the last 3 weeks despite a 2 week trial of antibiotics and a trial of prednisone.  She has loss of appetite, SOB, cough, constipation and she "feels hot".  She denies N/V, or pain. Per her husband, she has dementia that he has noticed over the last year, but has not been officially diagnosed.  Assessment/Plan: Principal Problem:   Acute bronchitis Active Problems:   Abnormal chest x-ray   Hypertension   Generalized weakness   Hypoxia  Multifocal pneumonia -Multifocal infection per CT scan. -This is worrisome for atypical infection as patient be treated pretty much for the past 4 weeks with antibiotics. -Consulted pulmonology, recommended ID consultation. -Supportive management with oxygen PRN, mucolytics, bronchodilators, and antitussives  Hypertension -Continue home medications (triamterene-HCTZ, atenolol)  Generalized weakness - likely 2/2 her respiratory symptoms - see if improved after respiratory improvement  SOB/Hypoxia - consult Pulmonology - discontinue prednisone  Constipation - start Miralax daily  Disposition: Remain inpatient  DVT Prophylaxis: Prophylactic Heparin  Code Status: Full code   Family Communication Husband at bedside  Procedures:  None  CONSULTS:  Pulmonology   MEDICATIONS: Scheduled Meds: . albuterol  2.5 mg Nebulization Q6H  . allopurinol  300 mg Oral Daily  . aspirin EC  81 mg Oral Daily   . atenolol  50 mg Oral Daily  . beta carotene w/minerals  1 tablet Oral Daily  . dorzolamide  1 drop Both Eyes BID  . guaiFENesin  1,200 mg Oral BID  . heparin  5,000 Units Subcutaneous Q8H  . ipratropium  0.5 mg Nebulization Q6H  . latanoprost  1 drop Both Eyes QHS  . levofloxacin (LEVAQUIN) IV  750 mg Intravenous Q24H  . PARoxetine  20 mg Oral Daily  . triamterene-hydrochlorothiazide  1 tablet Oral QODAY   Continuous Infusions: . sodium chloride 75 mL/hr at 04/26/13 0706   PRN Meds:.acetaminophen, acetaminophen, guaiFENesin-dextromethorphan, morphine injection, ondansetron (ZOFRAN) IV, ondansetron  Antibiotics: Anti-infectives   Start     Dose/Rate Route Frequency Ordered Stop   04/25/13 1530  levofloxacin (LEVAQUIN) IVPB 750 mg     750 mg 100 mL/hr over 90 Minutes Intravenous Every 24 hours 04/25/13 1515     04/25/13 1045  cefTRIAXone (ROCEPHIN) 1 g in dextrose 5 % 50 mL IVPB     1 g 100 mL/hr over 30 Minutes Intravenous  Once 04/25/13 1035 04/25/13 1125   04/25/13 1030  levofloxacin (LEVAQUIN) IVPB 750 mg     750 mg 100 mL/hr over 90 Minutes Intravenous  Once 04/25/13 1027 04/25/13 1302       PHYSICAL EXAM: Vital signs in last 24 hours: Filed Vitals:   04/25/13 2147 04/26/13 0119 04/26/13 0510 04/26/13 0735  BP: 160/71  151/76   Pulse: 70  71   Temp: 98.2 F (36.8 C)  97.7 F (36.5 C)   TempSrc: Oral  Oral   Resp: 20  18   Height:      Weight:  SpO2: 94% 93% 91% 93%    Weight change:  Filed Weights   04/25/13 1525  Weight: 73.2 kg (161 lb 6 oz)   Body mass index is 24.54 kg/(m^2).   Gen Exam: Awake and alert with clear speech. Neck: Supple, No JVD.  Chest: Decreased breath sounds. Exam limited by coughing. CVS:RRR no MGR Abdomen: SNT, NBS, non-distended, no organomegaly Extremities: no edema, lower extremities warm to touch Neurologic: Non Focal.     Intake/Output from previous day:  Intake/Output Summary (Last 24 hours) at 04/26/13  1041 Last data filed at 04/26/13 0926  Gross per 24 hour  Intake    530 ml  Output    975 ml  Net   -445 ml     LAB RESULTS: CBC  Recent Labs Lab 04/25/13 0815 04/26/13 0557  WBC 17.9* 14.8*  HGB 14.1 14.1  HCT 39.9 40.0  PLT 269 240  MCV 92.6 91.7  MCH 32.7 32.3  MCHC 35.3 35.3  RDW 14.2 14.3  LYMPHSABS 1.3  --   MONOABS 1.9*  --   EOSABS 0.2  --   BASOSABS 0.0  --     Chemistries   Recent Labs Lab 04/25/13 0815 04/26/13 0557  NA 131* 127*  K 4.3 3.8  CL 92* 89*  CO2 29 24  GLUCOSE 103* 93  BUN 22 19  CREATININE 0.78 0.80  CALCIUM 9.4 9.0    CBG: No results found for this basename: GLUCAP,  in the last 168 hours  GFR Estimated Creatinine Clearance: 53.7 ml/min (by C-G formula based on Cr of 0.8).  Coagulation profile No results found for this basename: INR, PROTIME,  in the last 168 hours  Cardiac Enzymes  Recent Labs Lab 04/25/13 0815  TROPONINI <0.30    No components found with this basename: POCBNP,  No results found for this basename: DDIMER,  in the last 72 hours No results found for this basename: HGBA1C,  in the last 72 hours No results found for this basename: CHOL, HDL, LDLCALC, TRIG, CHOLHDL, LDLDIRECT,  in the last 72 hours  Recent Labs  04/25/13 1547  TSH 2.033   No results found for this basename: VITAMINB12, FOLATE, FERRITIN, TIBC, IRON, RETICCTPCT,  in the last 72 hours No results found for this basename: LIPASE, AMYLASE,  in the last 72 hours  Urine Studies No results found for this basename: UACOL, UAPR, USPG, UPH, UTP, UGL, UKET, UBIL, UHGB, UNIT, UROB, ULEU, UEPI, UWBC, URBC, UBAC, CAST, CRYS, UCOM, BILUA,  in the last 72 hours  MICROBIOLOGY: No results found for this or any previous visit (from the past 240 hour(s)).  RADIOLOGY STUDIES/RESULTS: Dg Chest 2 View  04/25/2013   *RADIOLOGY REPORT*  Clinical Data: Productive cough, weakness, shortness of breath  CHEST - 2 VIEW  Comparison: Chest radiograph 03/28/2013,  03/12/2012, and 07/01/2010  Findings:   The heart, mediastinal, hilar contours are stable.  Lung volumes appear normal.  There is extensive abnormal peribronchial thickening that appears similar to a chest radiograph of July 2014 and progressive since the chest radiographs of June 2013 and July 2011.  Patchy airspace opacities in the peripheral right upper and lower lung and the left mid lung appears similar to chest radiograph of 18-1013 and March 28, 2013.  Negative for pleural effusion.  No acute bony abnormality.  IMPRESSION: Extensive abnormal peribronchial thickening and persistent, chronic appearing focal airspace opacities.  Potential considerations include chronic infections eosinophilic pneumonia, bronchiolitis obliterans organizing pneumonia, drug-induced lung disease, or chronic  mycobacterium avium intracelluare infection.  Given the chronicity of these findings and the patient's continued symptoms, further evaluation with chest CT could be considered.  This could be performed without intravenous contrast.   Original Report Authenticated By: Britta Mccreedy, M.D.   Dg Chest 2 View  03/28/2013   *RADIOLOGY REPORT*  Clinical Data: Cough and weakness.  CHEST - 2 VIEW  Comparison: Two-view chest 03/12/2012.  Findings: New interstitial and airspace disease is present in the right middle lobe and upper lobes bilaterally.  The heart size is normal.  No definite to effusions are present.  IMPRESSION:  1.  New bilateral pneumonia. 2.  Recommend follow-up chest radiographs to assure clearing.   Original Report Authenticated By: Marin Roberts, M.D.   Dg Thoracic Spine W/swimmers  04/18/2013   *RADIOLOGY REPORT*  Clinical Data: Upper back pain for a few weeks.  No known injury.  THORACIC SPINE - 2 VIEW + SWIMMERS  Comparison: Chest radiograph 03/28/2013 and 03/12/2012  Findings: The thoracic spine vertebral bodies are normal in height and alignment.  There are mild typical degenerative changes. No fracture or  suspicious bony lesion is identified.  IMPRESSION: No acute bony abnormality.   Original Report Authenticated By: Britta Mccreedy, M.D.   Ct Angio Chest Pe W/cm &/or Wo Cm  04/25/2013   *RADIOLOGY REPORT*  Clinical Data: Cough, sputum production, shortness of breath, generalized weakness, history of COPD, evaluate for pulmonary embolism  CT ANGIOGRAPHY CHEST  Technique:  Multidetector CT imaging of the chest using the standard protocol during bolus administration of intravenous contrast. Multiplanar reconstructed images including MIPs were obtained and reviewed to evaluate the vascular anatomy.  Contrast: OMNIPAQUE IOHEXOL 350 MG/ML SOLN  Comparison: Chest radiograph - earlier same day; 03/28/2013  Vascular Findings:  There is adequate opacification of the pulmonary arterial system of the main pulmonary artery measuring 524 HU.  There are no discrete filling defects within the pulmonary arterial tree to suggest pulmonary embolism.  Normal caliber of the main pulmonary artery.  Normal heart size.  Calcifications within the mitral valve annulus. Normal caliber of the thoracic aorta.  Conventional configuration of the aortic arch.  The major branch vessels of the aortic arch widely patent.  Incidental note is made of a ductus diverticulum. No thoracic aortic dissection or periaortic stranding.  -----------------------------------------------------------  Nonvascular findings:  There are consolidative airspace opacities primarily involving the inferior segment of the lingula and to a lesser extent, the lateral segment of the right middle lobe with associated air bronchograms and peripheral areas of geographic bronchiectasis.  There are minimal consolidative opacities within the right costophrenic angle and subpleural aspect of the right upper lobe (image 56, series six).  There are extensive basilar predominant tree in bud opacities within the bilateral lower lobes with scattered areas of geographic  bronchiectasis.  The pulmonary airways are patent.  Note is made of this slight mosaic attenuation of the pulmonary parenchyma.  There is an indeterminate approximate 1.5 x 1.4 cm nodule within the left lower lobe (image 85, series six).  There is mediastinal and right hilar lymphadenopathy with index precarinal node conglomeration measuring approximately 1.1 cm and a short axis diameter (image 50 for, series four) and index right hilar partially calcified node conglomeration measuring approximately 1.6 cm (image 66).   Scattered additional prevascular and left hilar lymph nodes, not enlarged by CT criteria.  No axillary lymphadenopathy.  Early arterial phase evaluation of the upper abdomen demonstrates a hypoattenuating sub centimeter (approximately 0.6 cm) lesion within  the dome of the left lobe of the liver (image 125, series 4) too small to accurate characterize lobe possibly representing a hepatic cyst.  Post cholecystectomy.  There is mild diffuse thickening of bilateral adrenal gland without discrete nodule.  No acute or aggressive osseous abnormalities.  Normal appearance of the thyroid gland.  IMPRESSION:  1.  Negative for pulmonary embolism. 2.  Overall findings worrisome for multifocal infection, worse within the lingula and right middle lobe, with associated consolidative opacities and areas of bronchiectasis - as such, atypical etiologies (such as MAC) should be considered. 3.  Extensive basilar predominant tree in bud opacities are worrisome for airways disseminated infection and/or aspiration.  4. Slight mosaic attenuation of the pulmonary parenchyma is suggestive of airways disease.  5.  Nonspecific mediastinal and right hilar lymphadenopathy, presumably reactive in etiology.  6.  Indeterminate approximately 1.5 cm nodule within the left lower lobe.  While possibly a discrete nodular area of infection, an underlying nodule is not excluded and as such, a follow-up chest CT in 6 weeks after treatment  is recommended for further evaluation.   Original Report Authenticated By: Tacey Ruiz, MD    Gerrit Friends, PA-S [facility]  If 7PM-7AM, please contact night-coverage [email or web address] Password [facility] 04/26/2013, 10:41 AM   LOS: 1 day    Addendum  Patient seen and examined, chart and data base reviewed.  I agree with the above assessment and plan.  For full details please see Ms Gerrit Friends, PA-S note.  I reviewed the above note and I addended it as needed.   Clint Lipps, MD [facility] Pager: [phone] 04/26/2013, 11:28 AM

## 2013-04-26 NOTE — Care Management Note (Signed)
    Page 1 of 1   04/29/2013     11:25:53 AM   CARE MANAGEMENT NOTE 04/29/2013  Patient:  Stacie Wright, Stacie Wright   Account Number:  [id number]  Date Initiated:  04/26/2013  Documentation initiated by:  Letha Cape  Subjective/Objective Assessment:   dx acute bronchitis, recurrent pna  admit-lives with spouse.     Action/Plan:   pt eval- no pt f/u needed.   Anticipated DC Date:  04/29/2013   Anticipated DC Plan:  HOME W HOME HEALTH SERVICES      DC Planning Services  CM consult      Choice offered to / List presented to:             Status of service:  Completed, signed off Medicare Important Message given?   (If response is "NO", the following Medicare IM given date fields will be blank) Date Medicare IM given:   Date Additional Medicare IM given:    Discharge Disposition:  HOME/SELF CARE  Per UR Regulation:  Reviewed for med. necessity/level of care/duration of stay  If discussed at Long Length of Stay Meetings, dates discussed:    Comments:  04/29/13 11:24 Letha Cape RN, BSN [phone] patient is for dc today, per physical therapy , patient has no pt f/u needed.  No needs anticiapated.

## 2013-04-26 NOTE — Consult Note (Signed)
INFECTIOUS DISEASE ATTENDING ADDENDUM:     [facility]   Date: 04/26/2013  Patient name: Stacie Wright  Medical record number: [id number]  Date of birth: 03-31-1928    This patient has been seen and discussed with the house staff. Please see their note for complete details. I concur with their findings with the following additions/corrections:  She has had a protracted least a month history of sputum production and failure to thrive although she has not gained weight. Some of this has improved with therapy with oral Bactrim which he is taking despite apparent allergic reactions to Bactrim in the past.  She is followed closely by Dr. Shaune Pollack with pulmonary medicine in [place].  I noted that she had a prior CT scan done in 2004 which showed multiple abnormalities in granulomatous disease and I wonder if some of the findings on the current CT scan were present 10 years ago. Radiology did not appear to have compared this CT angiogram to the one done 10 years ago.  I personally find it highly unlikely that she has healthcare associated pneumonia given the chronicity of her symptoms and her presentation with elevated blood pressure in rather nontoxic appearance.  I think more indolent infections such as M Avium, or a Dimorphic fungal infection with cryptococcus, histoplasmosis, or blastomyces would BE HIGHER in the differential.  Sarcoid is certainly also possible  I DO NOT think she has pulmonary TB and I have NOT put her into airborne isolation while we get AFB sputum cultures.   [name] 04/26/2013, 5:01 PM

## 2013-04-26 NOTE — Consult Note (Signed)
[facility]    Date of Admission:  04/25/2013  Date of Consult:  04/26/2013  Reason for Consult: PNA Referring Physician: Dr. Clydia Llano   HPI: Stacie Wright is an 79 y.o. female. with PMH significant for COPD (never smoker[place]), pneumonia, HTN, and some dementia as reported by her husband. She and her husband state that over the past several weeks she began coughing more than her usual baseline and her normal white mucus sputum changed to being green. She only notes some subjective feelings of being "hot" but denies subjective fevers, chills, night sweats, weight change, or N/V/D. She does endorse a decreased appetite over the past few days and has not been eating well and has not had a BM in a couple days. She visited her PCP at onset and was started on Bactrim DS on 04/08/13 (with 1 refill) and had minimal improvement. She was then given a prednisone taper which also provided very little relief. Her husband states that she felt progressively worse and short of breath these past few days and was not getting better, thus he brought her to the ED.  She and her husband deny exposure to TB, farm animals, contaminated water sources, and the patient has never left the country or traveled recently.   Past Medical History  Diagnosis Date  . Hypertension   . COPD (chronic obstructive pulmonary disease)   . Anxiety   . Shortness of breath   . Pneumonia 04/2013  . Headache(784.0)   . Arthritis     Past Surgical History  Procedure Laterality Date  . Abdominal hysterectomy    ergies:   Allergies  Allergen Reactions  . Amoxicillin Anaphylaxis and Rash  . Penicillins Anaphylaxis and Rash  . Sulfa Antibiotics Anaphylaxis and Rash  . Darvocet (Propoxyphene-Acetaminophen)     hallucinations     Medications: I have reviewed patients current medications as documented in Epic Anti-infectives   Start     Dose/Rate Route Frequency Ordered  Stop   04/25/13 1530  levofloxacin (LEVAQUIN) IVPB 750 mg     750 mg 100 mL/hr over 90 Minutes Intravenous Every 24 hours 04/25/13 1515     04/25/13 1045  cefTRIAXone (ROCEPHIN) 1 g in dextrose 5 % 50 mL IVPB     1 g 100 mL/hr over 30 Minutes Intravenous  Once 04/25/13 1035 04/25/13 1125   04/25/13 1030  levofloxacin (LEVAQUIN) IVPB 750 mg     750 mg 100 mL/hr over 90 Minutes Intravenous  Once 04/25/13 1027 04/25/13 1302      Social History:  reports that she has never smoked. She has never used smokeless tobacco. She reports that she does not drink alcohol or use illicit drugs.  Family History  Problem Relation Age of Onset  . Diabetes Father   . Hypertension Father     As in HPI and primary teams notes otherwise 12 point review of systems is negative  Blood pressure 151/76, pulse 71, temperature 97.7 F (36.5 C), temperature source Oral, resp. rate 18, height 5\' 8"  (1.727 m), weight 73.2 kg (161 lb 6 oz), SpO2 93.00%. General: Alert and awake, not in any acute distress, actively coughing up green phlegm in bed HEENT: anicteric sclera, pupils reactive to light and accommodation, EOMI, oropharynx with no exudate but does have some blood in her mouth and dry lips CVS regular rate, normal r,  no murmur rubs or gallops Chest: bibasilar crackles, diffuse rales, poor air  movement Abdomen: soft nontender, nondistended, normal bowel sounds Extremities: no  clubbing or edema noted bilaterally Skin: no rashes Neuro: nonfocal, strength and sensation intact   Results for orders placed during the hospital encounter of 04/25/13 (from the past 48 hour(s))  CBC WITH DIFFERENTIAL     Status: Abnormal   Collection Time    04/25/13  8:15 AM      Result Value Range   WBC 17.9 (*) 4.0 - 10.5 K/uL   RBC 4.31  3.87 - 5.11 MIL/uL   Hemoglobin 14.1  12.0 - 15.0 g/dL   HCT 62.1  30.8 - 65.7 %   MCV 92.6  78.0 - 100.0 fL   MCH 32.7  26.0 - 34.0 pg   MCHC 35.3  30.0 - 36.0 g/dL   RDW 84.6  96.2 -  95.2 %   Platelets 269  150 - 400 K/uL   Neutrophils Relative % 82 (*) 43 - 77 %   Neutro Abs 14.6 (*) 1.7 - 7.7 K/uL   Lymphocytes Relative 7 (*) 12 - 46 %   Lymphs Abs 1.3  0.7 - 4.0 K/uL   Monocytes Relative 11  3 - 12 %   Monocytes Absolute 1.9 (*) 0.1 - 1.0 K/uL   Eosinophils Relative 1  0 - 5 %   Eosinophils Absolute 0.2  0.0 - 0.7 K/uL   Basophils Relative 0  0 - 1 %   Basophils Absolute 0.0  0.0 - 0.1 K/uL  COMPREHENSIVE METABOLIC PANEL     Status: Abnormal   Collection Time    04/25/13  8:15 AM      Result Value Range   Sodium 131 (*) 135 - 145 mEq/L   Potassium 4.3  3.5 - 5.1 mEq/L   Chloride 92 (*) 96 - 112 mEq/L   CO2 29  19 - 32 mEq/L   Glucose, Bld 103 (*) 70 - 99 mg/dL   BUN 22  6 - 23 mg/dL   Creatinine, Ser 8.41  0.50 - 1.10 mg/dL   Calcium 9.4  8.4 - 32.4 mg/dL   Total Protein 7.2  6.0 - 8.3 g/dL   Albumin 3.1 (*) 3.5 - 5.2 g/dL   AST 22  0 - 37 U/L   ALT 22  0 - 35 U/L   Alkaline Phosphatase 66  39 - 117 U/L   Total Bilirubin 0.4  0.3 - 1.2 mg/dL   GFR calc non Af Amer 75 (*) >90 mL/min   GFR calc Af Amer 87 (*) >90 mL/min   Comment:            The eGFR has been calculated     using the CKD EPI equation.     This calculation has not been     validated in all clinical     situations.     eGFR's persistently     <90 mL/min signify     possible Chronic Kidney Disease.  PRO B NATRIURETIC PEPTIDE     Status: Abnormal   Collection Time    04/25/13  8:15 AM      Result Value Range   Pro B Natriuretic peptide (BNP) 456.8 (*) 0 - 450 pg/mL  TROPONIN I     Status: None   Collection Time    04/25/13  8:15 AM      Result Value Range   Troponin I <0.30  <0.30 ng/mL   Comment:            Due  to the release kinetics of cTnI,     a negative result within the first hours     of the onset of symptoms does not rule out     myocardial infarction with certainty.     If myocardial infarction is still suspected,     repeat the test at appropriate intervals.    URINALYSIS, ROUTINE W REFLEX MICROSCOPIC     Status: Abnormal   Collection Time    04/25/13  8:43 AM      Result Value Range   Color, Urine YELLOW  YELLOW   APPearance CLEAR  CLEAR   Specific Gravity, Urine 1.012  1.005 - 1.030   pH 8.0  5.0 - 8.0   Glucose, UA NEGATIVE  NEGATIVE mg/dL   Hgb urine dipstick NEGATIVE  NEGATIVE   Bilirubin Urine NEGATIVE  NEGATIVE   Ketones, ur NEGATIVE  NEGATIVE mg/dL   Protein, ur NEGATIVE  NEGATIVE mg/dL   Urobilinogen, UA 0.2  0.0 - 1.0 mg/dL   Nitrite NEGATIVE  NEGATIVE   Leukocytes, UA SMALL (*) NEGATIVE  URINE MICROSCOPIC-ADD ON     Status: None   Collection Time    04/25/13  8:43 AM      Result Value Range   Squamous Epithelial / LPF RARE  RARE   WBC, UA 3-6  <3 WBC/hpf   RBC / HPF 0-2  <3 RBC/hpf   Bacteria, UA RARE  RARE  TSH     Status: None   Collection Time    04/25/13  3:47 PM      Result Value Range   TSH 2.033  0.350 - 4.500 uIU/mL   Comment: Performed at [facility]  LEGIONELLA ANTIGEN, URINE     Status: None   Collection Time    04/25/13  6:51 PM      Result Value Range   Specimen Description URINE, CLEAN CATCH     Special Requests NONE     Legionella Antigen, Urine       Value: Negative for Legionella pneumophilia serogroup 1     Performed at [facility]   Report Status 04/26/2013 FINAL    STREP PNEUMONIAE URINARY ANTIGEN     Status: None   Collection Time    04/25/13  6:51 PM      Result Value Range   Strep Pneumo Urinary Antigen NEGATIVE  NEGATIVE   Comment:            Infection due to S. pneumoniae     cannot be absolutely ruled out     since the antigen present     may be below the detection limit     of the test.  BASIC METABOLIC PANEL     Status: Abnormal   Collection Time    04/26/13  5:57 AM      Result Value Range   Sodium 127 (*) 135 - 145 mEq/L   Potassium 3.8  3.5 - 5.1 mEq/L   Chloride 89 (*) 96 - 112 mEq/L   CO2 24  19 - 32 mEq/L   Glucose, Bld 93  70 - 99 mg/dL   BUN 19  6  - 23 mg/dL   Creatinine, Ser 2.13  0.50 - 1.10 mg/dL   Calcium 9.0  8.4 - 08.6 mg/dL   GFR calc non Af Amer 66 (*) >90 mL/min   GFR calc Af Amer 77 (*) >90 mL/min   Comment:  The eGFR has been calculated     using the CKD EPI equation.     This calculation has not been     validated in all clinical     situations.     eGFR's persistently     <90 mL/min signify     possible Chronic Kidney Disease.  CBC     Status: Abnormal   Collection Time    04/26/13  5:57 AM      Result Value Range   WBC 14.8 (*) 4.0 - 10.5 K/uL   RBC 4.36  3.87 - 5.11 MIL/uL   Hemoglobin 14.1  12.0 - 15.0 g/dL   HCT 47.8  29.5 - 62.1 %   MCV 91.7  78.0 - 100.0 fL   MCH 32.3  26.0 - 34.0 pg   MCHC 35.3  30.0 - 36.0 g/dL   RDW 30.8  65.7 - 84.6 %   Platelets 240  150 - 400 K/uL      Component Value Date/Time   SDES URINE, CLEAN CATCH 04/25/2013 1851   SPECREQUEST NONE 04/25/2013 1851   CULT Multiple bacterial morphotypes present, none predominant. Suggest appropriate recollection if clinically indicated. 07/01/2010 2132   REPTSTATUS 04/26/2013 FINAL 04/25/2013 1851   Dg Chest 2 View  04/25/2013   *RADIOLOGY REPORT*  Clinical Data: Productive cough, weakness, shortness of breath  CHEST - 2 VIEW  Comparison: Chest radiograph 03/28/2013, 03/12/2012, and 07/01/2010  Findings:   The heart, mediastinal, hilar contours are stable.  Lung volumes appear normal.  There is extensive abnormal peribronchial thickening that appears similar to a chest radiograph of July 2014 and progressive since the chest radiographs of June 2013 and July 2011.  Patchy airspace opacities in the peripheral right upper and lower lung and the left mid lung appears similar to chest radiograph of 18-1013 and March 28, 2013.  Negative for pleural effusion.  No acute bony abnormality.  IMPRESSION: Extensive abnormal peribronchial thickening and persistent, chronic appearing focal airspace opacities.  Potential considerations include chronic  infections eosinophilic pneumonia, bronchiolitis obliterans organizing pneumonia, drug-induced lung disease, or chronic mycobacterium avium intracelluare infection.  Given the chronicity of these findings and the patient's continued symptoms, further evaluation with chest CT could be considered.  This could be performed without intravenous contrast.   Original Report Authenticated By: Britta Mccreedy, M.D.   Ct Angio Chest Pe W/cm &/or Wo Cm  04/25/2013   *RADIOLOGY REPORT*  Clinical Data: Cough, sputum production, shortness of breath, generalized weakness, history of COPD, evaluate for pulmonary embolism  CT ANGIOGRAPHY CHEST  Technique:  Multidetector CT imaging of the chest using the standard protocol during bolus administration of intravenous contrast. Multiplanar reconstructed images including MIPs were obtained and reviewed to evaluate the vascular anatomy.  Contrast: OMNIPAQUE IOHEXOL 350 MG/ML SOLN  Comparison: Chest radiograph - earlier same day; 03/28/2013  Vascular Findings:  There is adequate opacification of the pulmonary arterial system of the main pulmonary artery measuring 524 HU.  There are no discrete filling defects within the pulmonary arterial tree to suggest pulmonary embolism.  Normal caliber of the main pulmonary artery.  Normal heart size.  Calcifications within the mitral valve annulus. Normal caliber of the thoracic aorta.  Conventional configuration of the aortic arch.  The major branch vessels of the aortic arch widely patent.  Incidental note is made of a ductus diverticulum. No thoracic aortic dissection or periaortic stranding.  -----------------------------------------------------------  Nonvascular findings:  There are consolidative airspace opacities primarily involving the inferior segment  of the lingula and to a lesser extent, the lateral segment of the right middle lobe with associated air bronchograms and peripheral areas of geographic bronchiectasis.  There are minimal  consolidative opacities within the right costophrenic angle and subpleural aspect of the right upper lobe (image 56, series six).  There are extensive basilar predominant tree in bud opacities within the bilateral lower lobes with scattered areas of geographic bronchiectasis.  The pulmonary airways are patent.  Note is made of this slight mosaic attenuation of the pulmonary parenchyma.  There is an indeterminate approximate 1.5 x 1.4 cm nodule within the left lower lobe (image 85, series six).  There is mediastinal and right hilar lymphadenopathy with index precarinal node conglomeration measuring approximately 1.1 cm and a short axis diameter (image 50 for, series four) and index right hilar partially calcified node conglomeration measuring approximately 1.6 cm (image 66).   Scattered additional prevascular and left hilar lymph nodes, not enlarged by CT criteria.  No axillary lymphadenopathy.  Early arterial phase evaluation of the upper abdomen demonstrates a hypoattenuating sub centimeter (approximately 0.6 cm) lesion within the dome of the left lobe of the liver (image 125, series 4) too small to accurate characterize lobe possibly representing a hepatic cyst.  Post cholecystectomy.  There is mild diffuse thickening of bilateral adrenal gland without discrete nodule.  No acute or aggressive osseous abnormalities.  Normal appearance of the thyroid gland.  IMPRESSION:  1.  Negative for pulmonary embolism. 2.  Overall findings worrisome for multifocal infection, worse within the lingula and right middle lobe, with associated consolidative opacities and areas of bronchiectasis - as such, atypical etiologies (such as MAC) should be considered. 3.  Extensive basilar predominant tree in bud opacities are worrisome for airways disseminated infection and/or aspiration.  4. Slight mosaic attenuation of the pulmonary parenchyma is suggestive of airways disease.  5.  Nonspecific mediastinal and right hilar lymphadenopathy,  presumably reactive in etiology.  6.  Indeterminate approximately 1.5 cm nodule within the left lower lobe.  While possibly a discrete nodular area of infection, an underlying nodule is not excluded and as such, a follow-up chest CT in 6 weeks after treatment is recommended for further evaluation.   Original Report Authenticated By: Tacey Ruiz, MD     No results found for this or any previous visit (from the past 720 hour(s)).   Impression/Recommendation Mrs. Keah Lamba is an 79 yo woman with COPD (never smoker), and history of pneumonia who presents with SOB and productive cough. Her differential diagnosis includes MAC infection, fungal infection, HCAP, and possibly sarcoid. Will plan to work her up while treating for HCAP pending results.  Multilobar Pneumonia: --d/c levaquin --begin Vancomycin and due to her allergy profile will also start aztreonam 2/2 her supposed pcn allergy with anaphylactic rxn  --AFB sputum stain and culture, AFB blood, HIV Ab --cryptococcal, histo, and blasto urine antigens,   --ACE level  Thank you so much for this interesting consult  [facility] [phone] (pager) [phone] (office) 04/26/2013, 3:17 PM  Lewie Chamber 04/26/2013, 3:17 PM

## 2013-04-27 DIAGNOSIS — K5900 Constipation, unspecified: Secondary | ICD-10-CM | POA: Diagnosis not present

## 2013-04-27 LAB — BASIC METABOLIC PANEL
Calcium: 8.9 mg/dL (ref 8.4–10.5)
Creatinine, Ser: 0.68 mg/dL (ref 0.50–1.10)
GFR calc non Af Amer: 79 mL/min — ABNORMAL LOW (ref >90)
Glucose, Bld: 205 mg/dL — ABNORMAL HIGH (ref 70–99)
Sodium: 128 mEq/L — ABNORMAL LOW (ref 135–145)

## 2013-04-27 LAB — CBC
Hemoglobin: 13.8 g/dL (ref 12.0–15.0)
MCH: 31.7 pg (ref 26.0–34.0)
MCHC: 34.3 g/dL (ref 30.0–36.0)
MCV: 92.2 fL (ref 78.0–100.0)
Platelets: 226 10*3/uL (ref 150–400)

## 2013-04-27 LAB — EXPECTORATED SPUTUM ASSESSMENT W GRAM STAIN, RFLX TO RESP C

## 2013-04-27 LAB — ANGIOTENSIN CONVERTING ENZYME: Angiotensin-Converting Enzyme: 41 U/L (ref 8–52)

## 2013-04-27 NOTE — Evaluation (Signed)
Physical Therapy Evaluation Patient Details Name: Stacie Wright MRN: [id number] DOB: Oct 04, 1927 Today's Date: 04/27/2013 Time: 4132-4401 PT Time Calculation (min): 24 min  PT Assessment / Plan / Recommendation History of Present Illness    79 year old female with PMH of COPD and recurrent pneumonia presenting to the hospital after an entire month of treatment for pneumonia. Presents to the hospital with SOB. Patient was admitted to [facility], CTA was done that revealed no PE but multi-focal PNA and bronchiectasis as well as a pulmonary nodule.    Clinical Impression  79 yo F admitted for above. Presents to Pt with balance deficits, some of which may be premorbid, but has had a functional decline.  Will benefit from skilled PT services to maximize mobility to allow for safe DC home with husband to assist.        PT Assessment  Patient needs continued PT services    Follow Up Recommendations  No PT follow up (If balance does not improve may benefit from HHPT)    Does the patient have the potential to tolerate intense rehabilitation      Barriers to Discharge        Equipment Recommendations  None recommended by PT    Recommendations for Other Services     Frequency Min 3X/week    Precautions / Restrictions     Pertinent Vitals/Pain Did not report any pain      Mobility  Bed Mobility Bed Mobility: Supine to Sit Supine to Sit: 7: Independent Transfers Transfers: Sit to Stand;Stand to Sit Sit to Stand: 4: Min guard;With upper extremity assist;From bed (Performed 3 times) Stand to Sit: 5: Supervision;With upper extremity assist;To bed Ambulation/Gait Ambulation/Gait Assistance: 4: Min assist Ambulation Distance (Feet): 150 Feet (also ambulated 100 feet once ) Assistive device: Rolling walker (also ambulated with no device) Gait Pattern: Step-through pattern;Decreased stride length;Narrow base of support Gait velocity: WFL General Gait Details: Pt much steadier with RW and  encouraged pt to use RW until balance improved.  Pt had several minor balance lossed when ambulating without device.  Pt reports feeling steadier with RW    Exercises     PT Diagnosis: Difficulty walking  PT Problem List: Decreased balance;Decreased mobility;Decreased knowledge of use of DME PT Treatment Interventions: DME instruction;Gait training;Balance training;Patient/family education     PT Goals(Current goals can be found in the care plan section) Acute Rehab PT Goals Patient Stated Goal: walk better PT Goal Formulation: With patient Time For Goal Achievement: 05/04/13 Potential to Achieve Goals: Good  Visit Information  Last PT Received On: 04/27/13 Assistance Needed: +1       Prior Functioning  Home Living Family/patient expects to be discharged to:: Private residence Living Arrangements: Spouse/significant other Available Help at Discharge: Family;Available 24 hours/day Type of Home: House Home Access: Stairs to enter Home Layout: One level;Other (Comment);Laundry or work area in [employer] Equipment: Environmental consultant - 2 wheels Prior Function Level of Independence: Independent Comments: Reports some balance issues since LE fracture 5-6 yrs ago Communication Communication: No difficulties    Cognition  Cognition Arousal/Alertness: Awake/alert Behavior During Therapy: WFL for tasks assessed/performed Overall Cognitive Status: Within Functional Limits for tasks assessed    Extremity/Trunk Assessment Upper Extremity Assessment Upper Extremity Assessment: Overall WFL for tasks assessed Lower Extremity Assessment Lower Extremity Assessment: Overall WFL for tasks assessed Cervical / Trunk Assessment Cervical / Trunk Assessment: Normal   Balance    End of Session PT - End of Session Equipment Utilized During Treatment:  Gait belt Activity Tolerance: Patient tolerated treatment well Patient left: in bed;with call bell/phone within reach;with family/visitor  present Nurse Communication: Mobility status (Please ambulate pt in hallways with RW)  GP     Donnella Sham 04/27/2013, 2:57 PM Lavona Mound, PT  [phone] 04/27/2013

## 2013-04-27 NOTE — Progress Notes (Signed)
INFECTIOUS DISEASE ATTENDING ADDENDUM:     [facility]   Date: 04/27/2013  Patient name: Stacie Wright  Medical record number: [id number]  Date of birth: 04/16/1928    This patient has been seen and discussed with the house staff. Please see their note for complete details. I concur with their findings with the following additions/corrections:  Patient seems stable to me,  Will continue to give HCAP coverage and followup culture data.  [name] 04/27/2013, 8:13 PM

## 2013-04-27 NOTE — Progress Notes (Addendum)
PATIENT DETAILS Name: Stacie Wright Age: 79 y.o. Sex: female Date of Birth: 05/11/1928 Admit Date: 04/25/2013 Admitting Physician Clydia Llano, MD WUJ:[id number],WGNFAO L, MD  Subjective: [name] says she "feels bad" today. She still has SOB, cough, loss of appetite, and constipation. Her cough has improved somewhat today.  She denies N/V, pain, fevers, and chills.  Assessment/Plan: Principal Problem:   Acute bronchitis Active Problems:   Abnormal chest x-ray   Hypertension   Generalized weakness   Hypoxia   Pneumonia, organism unspecified   COPD exacerbation   Bronchiectasis without acute exacerbation   Pulmonary nodule  Multifocal pneumonia/Acute Bronchitis -Multifocal infection per CT scan -Being followed by ID -Switched to Vancomycin and Aztreonam per ID -Awaiting non-expectorated respiratory cultures -Supportive management with oxygen PRN, mucolytics, bronchodilators, and antitussives -ID suspects MAC/fungal infection may be a possibility.  Hypertension -Continue home medications (triamterene-HCTZ, atenolol)  Generalized weakness - likely 2/2 her respiratory symptoms - see if improved after respiratory improvement - PT evaluation ordered  SOB/Hypoxia/Lung Nodule - Pulmonology to follow - Solumedrol at 40 mg IV q8 hours with a taper quickly once patient starts improving. - Mobilization by PT  Constipation - Miralax daily  Disposition: Remain inpatient  DVT Prophylaxis: Prophylactic Heparin  Code Status: Full code   Family Communication Husband at bedside  Procedures:  None  CONSULTS:  Pulmonology Infectious Disease Physical Therapy   MEDICATIONS: Scheduled Meds: . albuterol  2.5 mg Nebulization Q6H  . allopurinol  300 mg Oral Daily  . aspirin EC  81 mg Oral Daily  . atenolol  50 mg Oral Daily  . aztreonam  1 g Intravenous Q8H  . beta carotene w/minerals  1 tablet Oral Daily  . brimonidine  1 drop Both Eyes BID   And  . timolol  1  drop Both Eyes BID  . dorzolamide  1 drop Both Eyes BID  . guaiFENesin  1,200 mg Oral BID  . heparin  5,000 Units Subcutaneous Q8H  . ipratropium  0.5 mg Nebulization Q6H  . latanoprost  1 drop Both Eyes QHS  . methylPREDNISolone (SOLU-MEDROL) injection  40 mg Intravenous Q8H  . PARoxetine  20 mg Oral Daily  . polyethylene glycol  17 g Oral Daily  . triamterene-hydrochlorothiazide  1 tablet Oral QODAY  . vancomycin  750 mg Intravenous Q12H   Continuous Infusions: . sodium chloride 75 mL/hr at 04/27/13 0046   PRN Meds:.acetaminophen, acetaminophen, benzonatate, guaiFENesin-dextromethorphan, morphine injection, ondansetron (ZOFRAN) IV, ondansetron  Antibiotics: Anti-infectives   Start     Dose/Rate Route Frequency Ordered Stop   04/27/13 0600  vancomycin (VANCOCIN) IVPB 750 mg/150 ml premix     750 mg 150 mL/hr over 60 Minutes Intravenous Every 12 hours 04/26/13 1743     04/26/13 2000  aztreonam (AZACTAM) 1 g in dextrose 5 % 50 mL IVPB     1 g 100 mL/hr over 30 Minutes Intravenous Every 8 hours 04/26/13 1743     04/26/13 1830  vancomycin (VANCOCIN) IVPB 1000 mg/200 mL premix     1,000 mg 200 mL/hr over 60 Minutes Intravenous  Once 04/26/13 1743 04/26/13 1927   04/25/13 1530  levofloxacin (LEVAQUIN) IVPB 750 mg  Status:  Discontinued     750 mg 100 mL/hr over 90 Minutes Intravenous Every 24 hours 04/25/13 1515 04/26/13 1659   04/25/13 1045  cefTRIAXone (ROCEPHIN) 1 g in dextrose 5 % 50 mL IVPB     1 g 100 mL/hr over 30 Minutes Intravenous  Once 04/25/13 1035 04/25/13  1125   04/25/13 1030  levofloxacin (LEVAQUIN) IVPB 750 mg     750 mg 100 mL/hr over 90 Minutes Intravenous  Once 04/25/13 1027 04/25/13 1302       PHYSICAL EXAM: Vital signs in last 24 hours: Filed Vitals:   04/26/13 2115 04/26/13 2145 04/27/13 0517 04/27/13 0949  BP: 176/79 147/79 144/84   Pulse: 77  73   Temp: 97.7 F (36.5 C)  97.5 F (36.4 C)   TempSrc: Oral  Oral   Resp: 20  20   Height:       Weight:      SpO2: 91%  92% 91%    Weight change:  Filed Weights   04/25/13 1525  Weight: 73.2 kg (161 lb 6 oz)   Body mass index is 24.54 kg/(m^2).   Gen Exam: Awake and alert with clear speech. Neck: Supple, No JVD.  Chest: Decreased breath sounds, diffuse rales CVS:RRR no MGR Abdomen: SNT, NBS, non-distended, no organomegaly Extremities: no edema, lower extremities warm to touch, no clubbing Neurologic: Non Focal.     Intake/Output from previous day:  Intake/Output Summary (Last 24 hours) at 04/27/13 1016 Last data filed at 04/27/13 0916  Gross per 24 hour  Intake 3207.5 ml  Output   1100 ml  Net 2107.5 ml     LAB RESULTS: CBC  Recent Labs Lab 04/25/13 0815 04/26/13 0557 04/27/13 0920  WBC 17.9* 14.8* 13.9*  HGB 14.1 14.1 13.8  HCT 39.9 40.0 40.2  PLT 269 240 226  MCV 92.6 91.7 92.2  MCH 32.7 32.3 31.7  MCHC 35.3 35.3 34.3  RDW 14.2 14.3 14.5  LYMPHSABS 1.3  --   --   MONOABS 1.9*  --   --   EOSABS 0.2  --   --   BASOSABS 0.0  --   --     Chemistries   Recent Labs Lab 04/25/13 0815 04/26/13 0557  NA 131* 127*  K 4.3 3.8  CL 92* 89*  CO2 29 24  GLUCOSE 103* 93  BUN 22 19  CREATININE 0.78 0.80  CALCIUM 9.4 9.0    CBG: No results found for this basename: GLUCAP,  in the last 168 hours  GFR Estimated Creatinine Clearance: 53.7 ml/min (by C-G formula based on Cr of 0.8).  Coagulation profile No results found for this basename: INR, PROTIME,  in the last 168 hours  Cardiac Enzymes  Recent Labs Lab 04/25/13 0815  TROPONINI <0.30    No components found with this basename: POCBNP,  No results found for this basename: DDIMER,  in the last 72 hours No results found for this basename: HGBA1C,  in the last 72 hours No results found for this basename: CHOL, HDL, LDLCALC, TRIG, CHOLHDL, LDLDIRECT,  in the last 72 hours  Recent Labs  04/25/13 1547  TSH 2.033   No results found for this basename: VITAMINB12, FOLATE, FERRITIN, TIBC,  IRON, RETICCTPCT,  in the last 72 hours No results found for this basename: LIPASE, AMYLASE,  in the last 72 hours  Urine Studies No results found for this basename: UACOL, UAPR, USPG, UPH, UTP, UGL, UKET, UBIL, UHGB, UNIT, UROB, ULEU, UEPI, UWBC, URBC, UBAC, CAST, CRYS, UCOM, BILUA,  in the last 72 hours  MICROBIOLOGY: Recent Results (from the past 240 hour(s))  CULTURE, BLOOD (ROUTINE X 2)     Status: None   Collection Time    04/26/13  2:43 PM      Result Value Range Status   Specimen  Description BLOOD ARM RIGHT   Final   Special Requests BOTTLES DRAWN AEROBIC ONLY 2CC   Final   Culture  Setup Time     Final   Value: 04/26/2013 22:40     Performed at [facility]   Culture     Final   Value:        BLOOD CULTURE RECEIVED NO GROWTH TO DATE CULTURE WILL BE HELD FOR 5 DAYS BEFORE ISSUING A FINAL NEGATIVE REPORT     Performed at [facility]   Report Status PENDING   Incomplete  CULTURE, BLOOD (ROUTINE X 2)     Status: None   Collection Time    04/26/13  2:47 PM      Result Value Range Status   Specimen Description BLOOD HAND RIGHT   Final   Special Requests BOTTLES DRAWN AEROBIC ONLY 3CC   Final   Culture  Setup Time     Final   Value: 04/26/2013 22:41     Performed at [facility]   Culture     Final   Value:        BLOOD CULTURE RECEIVED NO GROWTH TO DATE CULTURE WILL BE HELD FOR 5 DAYS BEFORE ISSUING A FINAL NEGATIVE REPORT     Performed at [facility]   Report Status PENDING   Incomplete  CULTURE, EXPECTORATED SPUTUM-ASSESSMENT     Status: None   Collection Time    04/26/13  4:26 PM      Result Value Range Status   Specimen Description SPUTUM   Final   Special Requests NONE   Final   Sputum evaluation     Final   Value: MICROSCOPIC FINDINGS SUGGEST THAT THIS SPECIMEN IS NOT REPRESENTATIVE OF LOWER RESPIRATORY SECRETIONS. PLEASE RECOLLECT.     CALLED TO RN J.THACKER BY L.PITT @2024  04/26/13   Report Status 04/26/2013 FINAL   Final   CULTURE, EXPECTORATED SPUTUM-ASSESSMENT     Status: None   Collection Time    04/26/13  6:32 PM      Result Value Range Status   Specimen Description SPUTUM   Final   Special Requests Normal   Final   Sputum evaluation     Final   Value: MICROSCOPIC FINDINGS SUGGEST THAT THIS SPECIMEN IS NOT REPRESENTATIVE OF LOWER RESPIRATORY SECRETIONS. PLEASE RECOLLECT.     CALLED TO RN J.THACKER AT 0120 04/27/13 BY L.PITT   Report Status 04/27/2013 FINAL   Final    RADIOLOGY STUDIES/RESULTS: Dg Chest 2 View  04/25/2013   *RADIOLOGY REPORT*  Clinical Data: Productive cough, weakness, shortness of breath  CHEST - 2 VIEW  Comparison: Chest radiograph 03/28/2013, 03/12/2012, and 07/01/2010  Findings:   The heart, mediastinal, hilar contours are stable.  Lung volumes appear normal.  There is extensive abnormal peribronchial thickening that appears similar to a chest radiograph of July 2014 and progressive since the chest radiographs of June 2013 and July 2011.  Patchy airspace opacities in the peripheral right upper and lower lung and the left mid lung appears similar to chest radiograph of 18-1013 and March 28, 2013.  Negative for pleural effusion.  No acute bony abnormality.  IMPRESSION: Extensive abnormal peribronchial thickening and persistent, chronic appearing focal airspace opacities.  Potential considerations include chronic infections eosinophilic pneumonia, bronchiolitis obliterans organizing pneumonia, drug-induced lung disease, or chronic mycobacterium avium intracelluare infection.  Given the chronicity of these findings and the patient's continued symptoms, further evaluation with chest CT could be considered.  This could  be performed without intravenous contrast.   Original Report Authenticated By: Britta Mccreedy, M.D.   Dg Chest 2 View  03/28/2013   *RADIOLOGY REPORT*  Clinical Data: Cough and weakness.  CHEST - 2 VIEW  Comparison: Two-view chest 03/12/2012.  Findings: New interstitial and airspace  disease is present in the right middle lobe and upper lobes bilaterally.  The heart size is normal.  No definite to effusions are present.  IMPRESSION:  1.  New bilateral pneumonia. 2.  Recommend follow-up chest radiographs to assure clearing.   Original Report Authenticated By: Marin Roberts, M.D.   Dg Thoracic Spine W/swimmers  04/18/2013   *RADIOLOGY REPORT*  Clinical Data: Upper back pain for a few weeks.  No known injury.  THORACIC SPINE - 2 VIEW + SWIMMERS  Comparison: Chest radiograph 03/28/2013 and 03/12/2012  Findings: The thoracic spine vertebral bodies are normal in height and alignment.  There are mild typical degenerative changes. No fracture or suspicious bony lesion is identified.  IMPRESSION: No acute bony abnormality.   Original Report Authenticated By: Britta Mccreedy, M.D.   Ct Angio Chest Pe W/cm &/or Wo Cm  04/25/2013   *RADIOLOGY REPORT*  Clinical Data: Cough, sputum production, shortness of breath, generalized weakness, history of COPD, evaluate for pulmonary embolism  CT ANGIOGRAPHY CHEST  Technique:  Multidetector CT imaging of the chest using the standard protocol during bolus administration of intravenous contrast. Multiplanar reconstructed images including MIPs were obtained and reviewed to evaluate the vascular anatomy.  Contrast: OMNIPAQUE IOHEXOL 350 MG/ML SOLN  Comparison: Chest radiograph - earlier same day; 03/28/2013  Vascular Findings:  There is adequate opacification of the pulmonary arterial system of the main pulmonary artery measuring 524 HU.  There are no discrete filling defects within the pulmonary arterial tree to suggest pulmonary embolism.  Normal caliber of the main pulmonary artery.  Normal heart size.  Calcifications within the mitral valve annulus. Normal caliber of the thoracic aorta.  Conventional configuration of the aortic arch.  The major branch vessels of the aortic arch widely patent.  Incidental note is made of a ductus diverticulum. No thoracic  aortic dissection or periaortic stranding.  -----------------------------------------------------------  Nonvascular findings:  There are consolidative airspace opacities primarily involving the inferior segment of the lingula and to a lesser extent, the lateral segment of the right middle lobe with associated air bronchograms and peripheral areas of geographic bronchiectasis.  There are minimal consolidative opacities within the right costophrenic angle and subpleural aspect of the right upper lobe (image 56, series six).  There are extensive basilar predominant tree in bud opacities within the bilateral lower lobes with scattered areas of geographic bronchiectasis.  The pulmonary airways are patent.  Note is made of this slight mosaic attenuation of the pulmonary parenchyma.  There is an indeterminate approximate 1.5 x 1.4 cm nodule within the left lower lobe (image 85, series six).  There is mediastinal and right hilar lymphadenopathy with index precarinal node conglomeration measuring approximately 1.1 cm and a short axis diameter (image 50 for, series four) and index right hilar partially calcified node conglomeration measuring approximately 1.6 cm (image 66).   Scattered additional prevascular and left hilar lymph nodes, not enlarged by CT criteria.  No axillary lymphadenopathy.  Early arterial phase evaluation of the upper abdomen demonstrates a hypoattenuating sub centimeter (approximately 0.6 cm) lesion within the dome of the left lobe of the liver (image 125, series 4) too small to accurate characterize lobe possibly representing a hepatic cyst.  Post cholecystectomy.  There is mild diffuse thickening of bilateral adrenal gland without discrete nodule.  No acute or aggressive osseous abnormalities.  Normal appearance of the thyroid gland.  IMPRESSION:  1.  Negative for pulmonary embolism. 2.  Overall findings worrisome for multifocal infection, worse within the lingula and right middle lobe, with  associated consolidative opacities and areas of bronchiectasis - as such, atypical etiologies (such as MAC) should be considered. 3.  Extensive basilar predominant tree in bud opacities are worrisome for airways disseminated infection and/or aspiration.  4. Slight mosaic attenuation of the pulmonary parenchyma is suggestive of airways disease.  5.  Nonspecific mediastinal and right hilar lymphadenopathy, presumably reactive in etiology.  6.  Indeterminate approximately 1.5 cm nodule within the left lower lobe.  While possibly a discrete nodular area of infection, an underlying nodule is not excluded and as such, a follow-up chest CT in 6 weeks after treatment is recommended for further evaluation.   Original Report Authenticated By: Tacey Ruiz, MD    Gerrit Friends, PA-S [facility]  If 7PM-7AM, please contact night-coverage [email or web address] Password [email or web address] 04/27/2013, 10:16 AM   LOS: 2 days    Agree with above note by Whitney, PA-S. Abx have been broadened to cover HCAP pathogens. I agree with ID, that it seems like sarcoid vs a more insidious infection would be higher in the differential. Will start tapering steroids today. Patient tells me she is feeling a little better today and less SOB than on admission. F/u cx data and specific serologic workup.  Peggye Pitt, MD [facility] Pager: [phone]

## 2013-04-27 NOTE — Progress Notes (Signed)
[facility]    Subjective: Patient states she is feeling better today, coughing less, and has been eating a little bit more food than she was. She was unable to provide an appropriate sputum source and underwent induced sputum earlier this am. She denies fevers, chills, sweats, N/V/D. She has not had a BM as of the past few days but has no abdominal discomfort.   Antibiotics:  Anti-infectives   Start     Dose/Rate Route Frequency Ordered Stop   04/27/13 0600  vancomycin (VANCOCIN) IVPB 750 mg/150 ml premix     750 mg 150 mL/hr over 60 Minutes Intravenous Every 12 hours 04/26/13 1743     04/26/13 2000  aztreonam (AZACTAM) 1 g in dextrose 5 % 50 mL IVPB     1 g 100 mL/hr over 30 Minutes Intravenous Every 8 hours 04/26/13 1743     04/26/13 1830  vancomycin (VANCOCIN) IVPB 1000 mg/200 mL premix     1,000 mg 200 mL/hr over 60 Minutes Intravenous  Once 04/26/13 1743 04/26/13 1927   04/25/13 1530  levofloxacin (LEVAQUIN) IVPB 750 mg  Status:  Discontinued     750 mg 100 mL/hr over 90 Minutes Intravenous Every 24 hours 04/25/13 1515 04/26/13 1659   04/25/13 1045  cefTRIAXone (ROCEPHIN) 1 g in dextrose 5 % 50 mL IVPB     1 g 100 mL/hr over 30 Minutes Intravenous  Once 04/25/13 1035 04/25/13 1125   04/25/13 1030  levofloxacin (LEVAQUIN) IVPB 750 mg     750 mg 100 mL/hr over 90 Minutes Intravenous  Once 04/25/13 1027 04/25/13 1302      Medications: Scheduled Meds: . albuterol  2.5 mg Nebulization Q6H  . allopurinol  300 mg Oral Daily  . aspirin EC  81 mg Oral Daily  . atenolol  50 mg Oral Daily  . aztreonam  1 g Intravenous Q8H  . beta carotene w/minerals  1 tablet Oral Daily  . brimonidine  1 drop Both Eyes BID   And  . timolol  1 drop Both Eyes BID  . dorzolamide  1 drop Both Eyes BID  . guaiFENesin  1,200 mg Oral BID  . heparin  5,000 Units Subcutaneous Q8H  . ipratropium  0.5 mg Nebulization Q6H  . latanoprost  1 drop Both Eyes QHS  .  methylPREDNISolone (SOLU-MEDROL) injection  40 mg Intravenous Q8H  . PARoxetine  20 mg Oral Daily  . polyethylene glycol  17 g Oral Daily  . triamterene-hydrochlorothiazide  1 tablet Oral QODAY  . vancomycin  750 mg Intravenous Q12H   Continuous Infusions: . sodium chloride 75 mL/hr at 04/27/13 0046   PRN Meds:.acetaminophen, acetaminophen, benzonatate, guaiFENesin-dextromethorphan, morphine injection, ondansetron (ZOFRAN) IV, ondansetron   Objective: Weight change:   Intake/Output Summary (Last 24 hours) at 04/27/13 1111 Last data filed at 04/27/13 0916  Gross per 24 hour  Intake 3207.5 ml  Output   1100 ml  Net 2107.5 ml   Blood pressure 144/84, pulse 73, temperature 97.5 F (36.4 C), temperature source Oral, resp. rate 20, height 5\' 8"  (1.727 m), weight 73.2 kg (161 lb 6 oz), SpO2 91.00%. Temp:  [97.5 F (36.4 C)-98.8 F (37.1 C)] 97.5 F (36.4 C) (08/13 0517) Pulse Rate:  [73-77] 73 (08/13 0517) Resp:  [18-20] 20 (08/13 0517) BP: (129-176)/(69-84) 144/84 mmHg (08/13 0517) SpO2:  [91 %-93 %] 91 % (08/13 0949)  Physical Exam: General: Alert and awake, oriented x3, not in any acute distress. HEENT: anicteric  sclera, pupils reactive to light and accommodation, EOMI CVS regular rate, normal r,  no murmur rubs or gallops Chest: bibasilar crackles, diffuse rales, improved respiratory effort Abdomen: soft nontender, nondistended, normal bowel sounds, Extremities: no  clubbing or edema noted bilaterally Skin: no rashes Lymph: no new lymphadenopathy Neuro: nonfocal  Lab Results:  Recent Labs  04/26/13 0557 04/27/13 0920  WBC 14.8* 13.9*  HGB 14.1 13.8  HCT 40.0 40.2  PLT 240 226    BMET  Recent Labs  04/26/13 0557 04/27/13 0920  NA 127* 128*  K 3.8 3.8  CL 89* 91*  CO2 24 24  GLUCOSE 93 205*  BUN 19 18  CREATININE 0.80 0.68  CALCIUM 9.0 8.9    Micro Results: Recent Results (from the past 240 hour(s))  CULTURE, BLOOD (ROUTINE X 2)     Status: None    Collection Time    04/26/13  2:43 PM      Result Value Range Status   Specimen Description BLOOD ARM RIGHT   Final   Special Requests BOTTLES DRAWN AEROBIC ONLY 2CC   Final   Culture  Setup Time     Final   Value: 04/26/2013 22:40     Performed at [facility]   Culture     Final   Value:        BLOOD CULTURE RECEIVED NO GROWTH TO DATE CULTURE WILL BE HELD FOR 5 DAYS BEFORE ISSUING A FINAL NEGATIVE REPORT     Performed at [facility]   Report Status PENDING   Incomplete  CULTURE, BLOOD (ROUTINE X 2)     Status: None   Collection Time    04/26/13  2:47 PM      Result Value Range Status   Specimen Description BLOOD HAND RIGHT   Final   Special Requests BOTTLES DRAWN AEROBIC ONLY 3CC   Final   Culture  Setup Time     Final   Value: 04/26/2013 22:41     Performed at [facility]   Culture     Final   Value:        BLOOD CULTURE RECEIVED NO GROWTH TO DATE CULTURE WILL BE HELD FOR 5 DAYS BEFORE ISSUING A FINAL NEGATIVE REPORT     Performed at [facility]   Report Status PENDING   Incomplete  CULTURE, EXPECTORATED SPUTUM-ASSESSMENT     Status: None   Collection Time    04/26/13  4:26 PM      Result Value Range Status   Specimen Description SPUTUM   Final   Special Requests NONE   Final   Sputum evaluation     Final   Value: MICROSCOPIC FINDINGS SUGGEST THAT THIS SPECIMEN IS NOT REPRESENTATIVE OF LOWER RESPIRATORY SECRETIONS. PLEASE RECOLLECT.     CALLED TO RN J.THACKER BY L.PITT @2024  04/26/13   Report Status 04/26/2013 FINAL   Final  CULTURE, EXPECTORATED SPUTUM-ASSESSMENT     Status: None   Collection Time    04/26/13  6:32 PM      Result Value Range Status   Specimen Description SPUTUM   Final   Special Requests Normal   Final   Sputum evaluation     Final   Value: MICROSCOPIC FINDINGS SUGGEST THAT THIS SPECIMEN IS NOT REPRESENTATIVE OF LOWER RESPIRATORY SECRETIONS. PLEASE RECOLLECT.     CALLED TO RN J.THACKER AT 0120 04/27/13 BY L.PITT    Report Status 04/27/2013 FINAL   Final    Studies/Results: Ct Angio Chest  Pe W/cm &/or Wo Cm  04/25/2013   *RADIOLOGY REPORT*  Clinical Data: Cough, sputum production, shortness of breath, generalized weakness, history of COPD, evaluate for pulmonary embolism  CT ANGIOGRAPHY CHEST  Technique:  Multidetector CT imaging of the chest using the standard protocol during bolus administration of intravenous contrast. Multiplanar reconstructed images including MIPs were obtained and reviewed to evaluate the vascular anatomy.  Contrast: OMNIPAQUE IOHEXOL 350 MG/ML SOLN  Comparison: Chest radiograph - earlier same day; 03/28/2013  Vascular Findings:  There is adequate opacification of the pulmonary arterial system of the main pulmonary artery measuring 524 HU.  There are no discrete filling defects within the pulmonary arterial tree to suggest pulmonary embolism.  Normal caliber of the main pulmonary artery.  Normal heart size.  Calcifications within the mitral valve annulus. Normal caliber of the thoracic aorta.  Conventional configuration of the aortic arch.  The major branch vessels of the aortic arch widely patent.  Incidental note is made of a ductus diverticulum. No thoracic aortic dissection or periaortic stranding.  -----------------------------------------------------------  Nonvascular findings:  There are consolidative airspace opacities primarily involving the inferior segment of the lingula and to a lesser extent, the lateral segment of the right middle lobe with associated air bronchograms and peripheral areas of geographic bronchiectasis.  There are minimal consolidative opacities within the right costophrenic angle and subpleural aspect of the right upper lobe (image 56, series six).  There are extensive basilar predominant tree in bud opacities within the bilateral lower lobes with scattered areas of geographic bronchiectasis.  The pulmonary airways are patent.  Note is made of this slight mosaic  attenuation of the pulmonary parenchyma.  There is an indeterminate approximate 1.5 x 1.4 cm nodule within the left lower lobe (image 85, series six).  There is mediastinal and right hilar lymphadenopathy with index precarinal node conglomeration measuring approximately 1.1 cm and a short axis diameter (image 50 for, series four) and index right hilar partially calcified node conglomeration measuring approximately 1.6 cm (image 66).   Scattered additional prevascular and left hilar lymph nodes, not enlarged by CT criteria.  No axillary lymphadenopathy.  Early arterial phase evaluation of the upper abdomen demonstrates a hypoattenuating sub centimeter (approximately 0.6 cm) lesion within the dome of the left lobe of the liver (image 125, series 4) too small to accurate characterize lobe possibly representing a hepatic cyst.  Post cholecystectomy.  There is mild diffuse thickening of bilateral adrenal gland without discrete nodule.  No acute or aggressive osseous abnormalities.  Normal appearance of the thyroid gland.  IMPRESSION:  1.  Negative for pulmonary embolism. 2.  Overall findings worrisome for multifocal infection, worse within the lingula and right middle lobe, with associated consolidative opacities and areas of bronchiectasis - as such, atypical etiologies (such as MAC) should be considered. 3.  Extensive basilar predominant tree in bud opacities are worrisome for airways disseminated infection and/or aspiration.  4. Slight mosaic attenuation of the pulmonary parenchyma is suggestive of airways disease.  5.  Nonspecific mediastinal and right hilar lymphadenopathy, presumably reactive in etiology.  6.  Indeterminate approximately 1.5 cm nodule within the left lower lobe.  While possibly a discrete nodular area of infection, an underlying nodule is not excluded and as such, a follow-up chest CT in 6 weeks after treatment is recommended for further evaluation.   Original Report Authenticated By: Tacey Ruiz,  MD      Assessment/Plan: Stacie Wright is a 79 y.o. female with with COPD (never  smoker), and history of pneumonia who presented with SOB and productive cough. Cultures NTD although her sputum sources were not indicative of lower respi tract sources and she underwent induced sputum this morning. She had 2 courses of Bactrim therapy outpt prior to admission. Will continue to monitor.  Multilobar Pneumonia: --continue Vancomycin and Aztreonam per pharmacy, appreciate their assistance in the management of this patient  --f/u blood cx, respiratory culture, fungus cx with smear, AFB cx with smear,  --f/u respi virus panel --f/u HIV rna --f/u Histo Ag --Legionella urine Ag, neg --Crypto Ag neg --ACE level (41, WNL)    LOS: 2 days   Lewie Chamber 04/27/2013, 11:11 AM

## 2013-04-27 NOTE — Progress Notes (Signed)
PULMONARY  / CRITICAL CARE MEDICINE  Name: Stacie Wright MRN: [id number] DOB: August 25, 1928    ADMISSION DATE:  04/25/2013 CONSULTATION DATE:  04/26/2013  REFERRING MD :  EDP PRIMARY SERVICE:  PCCM  CHIEF COMPLAINT:  Fever, chills and tired.  BRIEF PATIENT DESCRIPTION: 79 year old female with PMH of COPD and recurrent pneumonia presenting to the hospital after an entire month of treatment for pneumonia.  Presents to the hospital with SOB.  Patient was admitted to [facility], CTA was done that revealed no PE but multi-focal PNA and bronchiectasis as well as a pulmonary nodule.  PCCM was called to evaluate the PNA and pulmonary nodule.  SIGNIFICANT EVENTS / STUDIES:  8/11 CT of the chest, negative for PE, multifocal PNA, mediastinal LAN and LLL nodule 1.5 ml.  LINES / TUBES: PIV  CULTURES: None  ANTIBIOTICS: Levofloxacin 8/11>>> 8/12  SUBJECTIVE:    VITAL SIGNS: Temp:  [97.5 F (36.4 C)-98.8 F (37.1 C)] 97.5 F (36.4 C) (08/13 0517) Pulse Rate:  [73-77] 73 (08/13 0517) Resp:  [18-20] 20 (08/13 0517) BP: (129-176)/(69-84) 144/84 mmHg (08/13 0517) SpO2:  [91 %-93 %] 91 % (08/13 0949)  PHYSICAL EXAMINATION: General:  Chronically ill appearing elderly female, NAD. Neuro:  Alert and interactive, moving all ext to command. HEENT:  [place]/AT, PERRL, EOM-I and MMM. Neck:  Supple, -LAN and -thyromegally.  Neg JVD. Cardiovascular:  RRR, Nl S1/S2, -M/R/G. Lungs:  Diffuse rales and end exp wheezes, crackles at the bases. Abdomen:  Soft, NT, ND and +BS. Musculoskeletal:  -edema and -tenderness. Skin:  Thin but intact.   Recent Labs Lab 04/25/13 0815 04/26/13 0557 04/27/13 0920  NA 131* 127* 128*  K 4.3 3.8 3.8  CL 92* 89* 91*  CO2 29 24 24   BUN 22 19 18   CREATININE 0.78 0.80 0.68  GLUCOSE 103* 93 205*    Recent Labs Lab 04/25/13 0815 04/26/13 0557 04/27/13 0920  HGB 14.1 14.1 13.8  HCT 39.9 40.0 40.2  WBC 17.9* 14.8* 13.9*  PLT 269 240 226   Ct Angio Chest Pe W/cm &/or  Wo Cm  04/25/2013   *RADIOLOGY REPORT*  Clinical Data: Cough, sputum production, shortness of breath, generalized weakness, history of COPD, evaluate for pulmonary embolism  CT ANGIOGRAPHY CHEST  Technique:  Multidetector CT imaging of the chest using the standard protocol during bolus administration of intravenous contrast. Multiplanar reconstructed images including MIPs were obtained and reviewed to evaluate the vascular anatomy.  Contrast: OMNIPAQUE IOHEXOL 350 MG/ML SOLN  Comparison: Chest radiograph - earlier same day; 03/28/2013  Vascular Findings:  There is adequate opacification of the pulmonary arterial system of the main pulmonary artery measuring 524 HU.  There are no discrete filling defects within the pulmonary arterial tree to suggest pulmonary embolism.  Normal caliber of the main pulmonary artery.  Normal heart size.  Calcifications within the mitral valve annulus. Normal caliber of the thoracic aorta.  Conventional configuration of the aortic arch.  The major branch vessels of the aortic arch widely patent.  Incidental note is made of a ductus diverticulum. No thoracic aortic dissection or periaortic stranding.  -----------------------------------------------------------  Nonvascular findings:  There are consolidative airspace opacities primarily involving the inferior segment of the lingula and to a lesser extent, the lateral segment of the right middle lobe with associated air bronchograms and peripheral areas of geographic bronchiectasis.  There are minimal consolidative opacities within the right costophrenic angle and subpleural aspect of the right upper lobe (image 56, series six).  There are extensive basilar predominant tree in bud opacities within the bilateral lower lobes with scattered areas of geographic bronchiectasis.  The pulmonary airways are patent.  Note is made of this slight mosaic attenuation of the pulmonary parenchyma.  There is an indeterminate approximate 1.5 x 1.4 cm  nodule within the left lower lobe (image 85, series six).  There is mediastinal and right hilar lymphadenopathy with index precarinal node conglomeration measuring approximately 1.1 cm and a short axis diameter (image 50 for, series four) and index right hilar partially calcified node conglomeration measuring approximately 1.6 cm (image 66).   Scattered additional prevascular and left hilar lymph nodes, not enlarged by CT criteria.  No axillary lymphadenopathy.  Early arterial phase evaluation of the upper abdomen demonstrates a hypoattenuating sub centimeter (approximately 0.6 cm) lesion within the dome of the left lobe of the liver (image 125, series 4) too small to accurate characterize lobe possibly representing a hepatic cyst.  Post cholecystectomy.  There is mild diffuse thickening of bilateral adrenal gland without discrete nodule.  No acute or aggressive osseous abnormalities.  Normal appearance of the thyroid gland.  IMPRESSION:  1.  Negative for pulmonary embolism. 2.  Overall findings worrisome for multifocal infection, worse within the lingula and right middle lobe, with associated consolidative opacities and areas of bronchiectasis - as such, atypical etiologies (such as MAC) should be considered. 3.  Extensive basilar predominant tree in bud opacities are worrisome for airways disseminated infection and/or aspiration.  4. Slight mosaic attenuation of the pulmonary parenchyma is suggestive of airways disease.  5.  Nonspecific mediastinal and right hilar lymphadenopathy, presumably reactive in etiology.  6.  Indeterminate approximately 1.5 cm nodule within the left lower lobe.  While possibly a discrete nodular area of infection, an underlying nodule is not excluded and as such, a follow-up chest CT in 6 weeks after treatment is recommended for further evaluation.   Original Report Authenticated By: Tacey Ruiz, MD    ASSESSMENT / PLAN:  79 year old female with COPD who presents with SOB and B  nodular infiltrates, pneumonia.  Note bronchiectasis, mediastinal LAN, centrilobular emphysema and a pulmonary nodule.  Plan:  - Recommend broadening abx to include HCAP given the fact that patient had been on other abx within the past 3 months. - Start solumedrol at 40 mg IV q8 hours with a taper quickly once patient starts improving. - Would consider adding pseudomonas coverage but will defer to ID given bronchiectasis. - Continue duonebs and PRN albuterol. - Physical therapy and early mobilization would be of great benefit in this case. - IS per RT protocol. - MAIC or other opportunistic process certainly on Ddx. Would treat for acute bacterial PNA and decide whether she needs FOB and BAL based on her clinical (and probably also radiographical improvement). Would repeat CT chest in 6 weeks, to be reviewed outpt - either by Dr Juanetta Gosling or at [facility].  - Will defer to ID the suspicion for MAC or MAI, but in my opinion treatment has not been adequate for HCAP. - PCCM will continue to follow with you.   Levy Pupa, MD, PhD 04/27/2013, 12:15 PM  Pulmonary and Critical Care [phone] or if no answer [phone]

## 2013-04-28 LAB — RESPIRATORY VIRUS PANEL
Influenza A: NOT DETECTED
Parainfluenza 1: NOT DETECTED
Parainfluenza 2: NOT DETECTED
Respiratory Syncytial Virus B: NOT DETECTED
Rhinovirus: NOT DETECTED

## 2013-04-28 MED ORDER — METHYLPREDNISOLONE SODIUM SUCC 40 MG IJ SOLR
40.0000 mg | Freq: Two times a day (BID) | INTRAMUSCULAR | Status: DC
  Administered 2013-04-28 – 2013-04-29 (×2): 40 mg via INTRAVENOUS
  Filled 2013-04-28 (×4): qty 1

## 2013-04-28 MED ORDER — SENNA 8.6 MG PO TABS
2.0000 | ORAL_TABLET | Freq: Every day | ORAL | Status: DC
  Administered 2013-04-28 – 2013-04-29 (×2): 17.2 mg via ORAL
  Filled 2013-04-28 (×2): qty 2

## 2013-04-28 MED ORDER — LEVOFLOXACIN 750 MG PO TABS
750.0000 mg | ORAL_TABLET | Freq: Every day | ORAL | Status: DC
  Administered 2013-04-28 – 2013-04-29 (×2): 750 mg via ORAL
  Filled 2013-04-28 (×3): qty 1

## 2013-04-28 MED ORDER — IPRATROPIUM BROMIDE 0.02 % IN SOLN
RESPIRATORY_TRACT | Status: AC
  Filled 2013-04-28: qty 2.5

## 2013-04-28 MED ORDER — ALBUTEROL SULFATE (5 MG/ML) 0.5% IN NEBU
INHALATION_SOLUTION | RESPIRATORY_TRACT | Status: AC
  Filled 2013-04-28: qty 0.5

## 2013-04-28 NOTE — Progress Notes (Signed)
[facility]    Subjective: Patient sitting up in bed eating breakfast. She states she feels better this morning, is coughing less, and is less productive of sputum that is more clear in color. Her appetite continues to improve, however she notes still no BM since admission, despite miralax, however she does not complain of any constipation discomfort. Denies fevers, chills, sweats, N/V/D.     Antibiotics:  Anti-infectives   Start     Dose/Rate Route Frequency Ordered Stop   04/27/13 0600  vancomycin (VANCOCIN) IVPB 750 mg/150 ml premix     750 mg 150 mL/hr over 60 Minutes Intravenous Every 12 hours 04/26/13 1743     04/26/13 2000  aztreonam (AZACTAM) 1 g in dextrose 5 % 50 mL IVPB     1 g 100 mL/hr over 30 Minutes Intravenous Every 8 hours 04/26/13 1743     04/26/13 1830  vancomycin (VANCOCIN) IVPB 1000 mg/200 mL premix     1,000 mg 200 mL/hr over 60 Minutes Intravenous  Once 04/26/13 1743 04/26/13 1927   04/25/13 1530  levofloxacin (LEVAQUIN) IVPB 750 mg  Status:  Discontinued     750 mg 100 mL/hr over 90 Minutes Intravenous Every 24 hours 04/25/13 1515 04/26/13 1659   04/25/13 1045  cefTRIAXone (ROCEPHIN) 1 g in dextrose 5 % 50 mL IVPB     1 g 100 mL/hr over 30 Minutes Intravenous  Once 04/25/13 1035 04/25/13 1125   04/25/13 1030  levofloxacin (LEVAQUIN) IVPB 750 mg     750 mg 100 mL/hr over 90 Minutes Intravenous  Once 04/25/13 1027 04/25/13 1302      Medications: Scheduled Meds: . albuterol  2.5 mg Nebulization Q6H  . allopurinol  300 mg Oral Daily  . aspirin EC  81 mg Oral Daily  . atenolol  50 mg Oral Daily  . aztreonam  1 g Intravenous Q8H  . beta carotene w/minerals  1 tablet Oral Daily  . brimonidine  1 drop Both Eyes BID   And  . timolol  1 drop Both Eyes BID  . dorzolamide  1 drop Both Eyes BID  . guaiFENesin  1,200 mg Oral BID  . heparin  5,000 Units Subcutaneous Q8H  . ipratropium  0.5 mg Nebulization Q6H  . latanoprost  1  drop Both Eyes QHS  . methylPREDNISolone (SOLU-MEDROL) injection  40 mg Intravenous Q8H  . PARoxetine  20 mg Oral Daily  . polyethylene glycol  17 g Oral Daily  . triamterene-hydrochlorothiazide  1 tablet Oral QODAY  . vancomycin  750 mg Intravenous Q12H   Continuous Infusions: . sodium chloride 75 mL/hr at 04/28/13 0540   PRN Meds:.acetaminophen, acetaminophen, benzonatate, guaiFENesin-dextromethorphan, morphine injection, ondansetron (ZOFRAN) IV, ondansetron   Objective: Weight change:   Intake/Output Summary (Last 24 hours) at 04/28/13 0940 Last data filed at 04/28/13 2956  Gross per 24 hour  Intake 1993.75 ml  Output      0 ml  Net 1993.75 ml   Blood pressure 151/92, pulse 95, temperature 97.8 F (36.6 C), temperature source Oral, resp. rate 20, height 5\' 8"  (1.727 m), weight 73.2 kg (161 lb 6 oz), SpO2 97.00%. Temp:  [97.5 F (36.4 C)-97.8 F (36.6 C)] 97.8 F (36.6 C) (08/14 0549) Pulse Rate:  [77-95] 95 (08/14 0549) Resp:  [18-20] 20 (08/14 0549) BP: (151-169)/(78-92) 151/92 mmHg (08/14 0549) SpO2:  [91 %-98 %] 97 % (08/14 2130)  Physical Exam: General: Alert and awake, oriented x3, not in any acute  distress. HEENT: anicteric sclera, pupils reactive to light and accommodation, EOMI CVS regular rate, normal r,  no murmur rubs or gallops Chest: B/L exp wheezing upper lobes, crackles and rales B/L diffuse, improved air movement and respiration throughout Abdomen: soft nontender, nondistended, normal bowel sounds, Extremities: no  clubbing or edema noted bilaterally Skin: no rashes Lymph: no new lymphadenopathy Neuro: nonfocal  Lab Results:  Recent Labs  04/26/13 0557 04/27/13 0920  WBC 14.8* 13.9*  HGB 14.1 13.8  HCT 40.0 40.2  PLT 240 226    BMET  Recent Labs  04/26/13 0557 04/27/13 0920  NA 127* 128*  K 3.8 3.8  CL 89* 91*  CO2 24 24  GLUCOSE 93 205*  BUN 19 18  CREATININE 0.80 0.68  CALCIUM 9.0 8.9    Micro Results: Recent Results (from  the past 240 hour(s))  CULTURE, BLOOD (ROUTINE X 2)     Status: None   Collection Time    04/26/13  2:43 PM      Result Value Range Status   Specimen Description BLOOD ARM RIGHT   Final   Special Requests BOTTLES DRAWN AEROBIC ONLY 2CC   Final   Culture  Setup Time     Final   Value: 04/26/2013 22:40     Performed at [facility]   Culture     Final   Value:        BLOOD CULTURE RECEIVED NO GROWTH TO DATE CULTURE WILL BE HELD FOR 5 DAYS BEFORE ISSUING A FINAL NEGATIVE REPORT     Performed at [facility]   Report Status PENDING   Incomplete  CULTURE, BLOOD (ROUTINE X 2)     Status: None   Collection Time    04/26/13  2:47 PM      Result Value Range Status   Specimen Description BLOOD HAND RIGHT   Final   Special Requests BOTTLES DRAWN AEROBIC ONLY 3CC   Final   Culture  Setup Time     Final   Value: 04/26/2013 22:41     Performed at [facility]   Culture     Final   Value:        BLOOD CULTURE RECEIVED NO GROWTH TO DATE CULTURE WILL BE HELD FOR 5 DAYS BEFORE ISSUING A FINAL NEGATIVE REPORT     Performed at [facility]   Report Status PENDING   Incomplete  CULTURE, EXPECTORATED SPUTUM-ASSESSMENT     Status: None   Collection Time    04/26/13  4:26 PM      Result Value Range Status   Specimen Description SPUTUM   Final   Special Requests NONE   Final   Sputum evaluation     Final   Value: MICROSCOPIC FINDINGS SUGGEST THAT THIS SPECIMEN IS NOT REPRESENTATIVE OF LOWER RESPIRATORY SECRETIONS. PLEASE RECOLLECT.     CALLED TO RN J.THACKER BY L.PITT @2024  04/26/13   Report Status 04/26/2013 FINAL   Final  CULTURE, EXPECTORATED SPUTUM-ASSESSMENT     Status: None   Collection Time    04/26/13  6:32 PM      Result Value Range Status   Specimen Description SPUTUM   Final   Special Requests Normal   Final   Sputum evaluation     Final   Value: MICROSCOPIC FINDINGS SUGGEST THAT THIS SPECIMEN IS NOT REPRESENTATIVE OF LOWER RESPIRATORY SECRETIONS. PLEASE  RECOLLECT.     CALLED TO RN J.THACKER AT 0120 04/27/13 BY L.PITT   Report Status  04/27/2013 FINAL   Final  CULTURE, RESPIRATORY (NON-EXPECTORATED)     Status: None   Collection Time    04/27/13  6:54 AM      Result Value Range Status   Specimen Description TRACHEAL ASPIRATE   Final   Special Requests Normal   Final   Gram Stain     Final   Value: RARE WBC PRESENT, PREDOMINANTLY PMN     FEW SQUAMOUS EPITHELIAL CELLS PRESENT     FEW GRAM POSITIVE COCCI IN CLUSTERS     Performed at [facility]   Culture     Final   Value: Culture reincubated for better growth     Performed at [facility]   Report Status PENDING   Incomplete    Studies/Results: No results found.    Assessment/Plan: Stacie Wright is a 79 y.o. female with COPD (never smoker), and history of pneumonia who presented with SOB and productive cough. Tracheal aspirate yesterday positive for few GPC in clusters. Overall patient is improving since admission.  Abx: Rocephin (8/11) Aztreonam (8/12>>8/14) Vanc (8/12>>8/14) Levaquin (8/11>>8/12),  (8/14>>  Multifocal pneumonia: --d/c vanc and aztreonam --restart levaquin for anti-pseudo coverage (aztreonam also was providing such coverage in IV form) --blood cx NTD, tracheal aspirate positive few GPC in clusters --Legionella urine Ag neg, Crypto Ag neg, ACE level WNL, Strep pna urinary Ag neg --f/u remaining cultures    LOS: 3 days   Lewie Chamber 04/28/2013, 9:40 AM

## 2013-04-28 NOTE — Progress Notes (Signed)
PATIENT DETAILS Name: Stacie Wright Age: 79 y.o. Sex: female Date of Birth: [birth year] Admit Date: 04/25/2013 Admitting Physician Clydia Llano, MD ZOX:[id number] L, MD  Subjective: Stacie Wright says she "feels much better" today. Her SOB, cough, and appetite have all improved. She still has constipation, without other abdominal complaints.   She denies N/V, pain, fevers, and chills.  Assessment/Plan: Principal Problem:   Acute bronchitis Active Problems:   Abnormal chest x-ray   Hypertension   Generalized weakness   Hypoxia   Pneumonia, organism unspecified   COPD exacerbation   Bronchiectasis without acute exacerbation   Pulmonary nodule   Unspecified constipation  Multifocal pneumonia/Acute Bronchitis -Being followed by ID -Supportive management with oxygen PRN, mucolytics, bronchodilators, and antitussives -Continue Vancomycin and Aztreonam -Tracheal Aspirate positive for few GPC in clusters -Fungal smear negative  Hypertension -Continue home medications (triamterene-HCTZ, atenolol)  Generalized weakness - likely 2/2 her respiratory symptoms - is improving - PT reports no equipment or follow up needed  SOB/Hypoxia/Lung Nodule - Pulmonology to follow - Solumedrol taper down to 40 mg Q12H - Mobilization by PT  Constipation - Miralax daily - Start Senokot daily  Disposition: Remain inpatient  DVT Prophylaxis: Prophylactic Heparin  Code Status: Full code   Family Communication Husband at bedside  Procedures:  None  CONSULTS:  Pulmonology Infectious Disease Physical Therapy   MEDICATIONS: Scheduled Meds: . albuterol  2.5 mg Nebulization Q6H  . allopurinol  300 mg Oral Daily  . aspirin EC  81 mg Oral Daily  . atenolol  50 mg Oral Daily  . aztreonam  1 g Intravenous Q8H  . beta carotene w/minerals  1 tablet Oral Daily  . brimonidine  1 drop Both Eyes BID   And  . timolol  1 drop Both Eyes BID  . dorzolamide  1 drop Both Eyes BID  .  guaiFENesin  1,200 mg Oral BID  . heparin  5,000 Units Subcutaneous Q8H  . ipratropium  0.5 mg Nebulization Q6H  . latanoprost  1 drop Both Eyes QHS  . methylPREDNISolone (SOLU-MEDROL) injection  40 mg Intravenous Q8H  . PARoxetine  20 mg Oral Daily  . polyethylene glycol  17 g Oral Daily  . triamterene-hydrochlorothiazide  1 tablet Oral QODAY  . vancomycin  750 mg Intravenous Q12H   Continuous Infusions: . sodium chloride 75 mL/hr at 04/28/13 0540   PRN Meds:.acetaminophen, acetaminophen, benzonatate, guaiFENesin-dextromethorphan, morphine injection, ondansetron (ZOFRAN) IV, ondansetron  Antibiotics: Anti-infectives   Start     Dose/Rate Route Frequency Ordered Stop   04/27/13 0600  vancomycin (VANCOCIN) IVPB 750 mg/150 ml premix     750 mg 150 mL/hr over 60 Minutes Intravenous Every 12 hours 04/26/13 1743     04/26/13 2000  aztreonam (AZACTAM) 1 g in dextrose 5 % 50 mL IVPB     1 g 100 mL/hr over 30 Minutes Intravenous Every 8 hours 04/26/13 1743     04/26/13 1830  vancomycin (VANCOCIN) IVPB 1000 mg/200 mL premix     1,000 mg 200 mL/hr over 60 Minutes Intravenous  Once 04/26/13 1743 04/26/13 1927   04/25/13 1530  levofloxacin (LEVAQUIN) IVPB 750 mg  Status:  Discontinued     750 mg 100 mL/hr over 90 Minutes Intravenous Every 24 hours 04/25/13 1515 04/26/13 1659   04/25/13 1045  cefTRIAXone (ROCEPHIN) 1 g in dextrose 5 % 50 mL IVPB     1 g 100 mL/hr over 30 Minutes Intravenous  Once 04/25/13 1035 04/25/13 1125   04/25/13 1030  levofloxacin (LEVAQUIN) IVPB 750 mg     750 mg 100 mL/hr over 90 Minutes Intravenous  Once 04/25/13 1027 04/25/13 1302       PHYSICAL EXAM: Vital signs in last 24 hours: Filed Vitals:   04/27/13 2112 04/27/13 2221 04/28/13 0549 04/28/13 0812  BP: 169/78 159/81 151/92   Pulse: 77  95   Temp: 97.5 F (36.4 C)  97.8 F (36.6 C)   TempSrc: Oral  Oral   Resp: 18  20   Height:      Weight:      SpO2: 98%  96% 97%    Weight change:  Filed  Weights   04/25/13 1525  Weight: 73.2 kg (161 lb 6 oz)   Body mass index is 24.54 kg/(m^2).   Gen Exam: Awake and alert with clear speech. Neck: Supple, No JVD.  Chest: Decreased breath sounds, diffuse rales CVS:RRR no MGR Abdomen: SNT, NBS, non-distended, no organomegaly Extremities: no edema, lower extremities warm to touch, no clubbing Neurologic: Non Focal.     Intake/Output from previous day:  Intake/Output Summary (Last 24 hours) at 04/28/13 1030 Last data filed at 04/28/13 0623  Gross per 24 hour  Intake 1993.75 ml  Output      0 ml  Net 1993.75 ml     LAB RESULTS: CBC  Recent Labs Lab 04/25/13 0815 04/26/13 0557 04/27/13 0920  WBC 17.9* 14.8* 13.9*  HGB 14.1 14.1 13.8  HCT 39.9 40.0 40.2  PLT 269 240 226  MCV 92.6 91.7 92.2  MCH 32.7 32.3 31.7  MCHC 35.3 35.3 34.3  RDW 14.2 14.3 14.5  LYMPHSABS 1.3  --   --   MONOABS 1.9*  --   --   EOSABS 0.2  --   --   BASOSABS 0.0  --   --     Chemistries   Recent Labs Lab 04/25/13 0815 04/26/13 0557 04/27/13 0920  NA 131* 127* 128*  K 4.3 3.8 3.8  CL 92* 89* 91*  CO2 29 24 24   GLUCOSE 103* 93 205*  BUN 22 19 18   CREATININE 0.78 0.80 0.68  CALCIUM 9.4 9.0 8.9    CBG: No results found for this basename: GLUCAP,  in the last 168 hours  GFR Estimated Creatinine Clearance: 53.7 ml/min (by C-G formula based on Cr of 0.68).  Coagulation profile No results found for this basename: INR, PROTIME,  in the last 168 hours  Cardiac Enzymes  Recent Labs Lab 04/25/13 0815  TROPONINI <0.30    No components found with this basename: POCBNP,  No results found for this basename: DDIMER,  in the last 72 hours No results found for this basename: HGBA1C,  in the last 72 hours No results found for this basename: CHOL, HDL, LDLCALC, TRIG, CHOLHDL, LDLDIRECT,  in the last 72 hours  Recent Labs  04/25/13 1547  TSH 2.033   No results found for this basename: VITAMINB12, FOLATE, FERRITIN, TIBC, IRON,  RETICCTPCT,  in the last 72 hours No results found for this basename: LIPASE, AMYLASE,  in the last 72 hours  Urine Studies No results found for this basename: UACOL, UAPR, USPG, UPH, UTP, UGL, UKET, UBIL, UHGB, UNIT, UROB, ULEU, UEPI, UWBC, URBC, UBAC, CAST, CRYS, UCOM, BILUA,  in the last 72 hours  MICROBIOLOGY: Recent Results (from the past 240 hour(s))  CULTURE, BLOOD (ROUTINE X 2)     Status: None   Collection Time    04/26/13  2:43 PM  Result Value Range Status   Specimen Description BLOOD ARM RIGHT   Final   Special Requests BOTTLES DRAWN AEROBIC ONLY 2CC   Final   Culture  Setup Time     Final   Value: 04/26/2013 22:40     Performed at [facility]   Culture     Final   Value:        BLOOD CULTURE RECEIVED NO GROWTH TO DATE CULTURE WILL BE HELD FOR 5 DAYS BEFORE ISSUING A FINAL NEGATIVE REPORT     Performed at [facility]   Report Status PENDING   Incomplete  CULTURE, BLOOD (ROUTINE X 2)     Status: None   Collection Time    04/26/13  2:47 PM      Result Value Range Status   Specimen Description BLOOD HAND RIGHT   Final   Special Requests BOTTLES DRAWN AEROBIC ONLY 3CC   Final   Culture  Setup Time     Final   Value: 04/26/2013 22:41     Performed at [facility]   Culture     Final   Value:        BLOOD CULTURE RECEIVED NO GROWTH TO DATE CULTURE WILL BE HELD FOR 5 DAYS BEFORE ISSUING A FINAL NEGATIVE REPORT     Performed at [facility]   Report Status PENDING   Incomplete  CULTURE, EXPECTORATED SPUTUM-ASSESSMENT     Status: None   Collection Time    04/26/13  4:26 PM      Result Value Range Status   Specimen Description SPUTUM   Final   Special Requests NONE   Final   Sputum evaluation     Final   Value: MICROSCOPIC FINDINGS SUGGEST THAT THIS SPECIMEN IS NOT REPRESENTATIVE OF LOWER RESPIRATORY SECRETIONS. PLEASE RECOLLECT.     CALLED TO RN J.THACKER BY L.PITT @2024  04/26/13   Report Status 04/26/2013 FINAL   Final  CULTURE,  EXPECTORATED SPUTUM-ASSESSMENT     Status: None   Collection Time    04/26/13  6:32 PM      Result Value Range Status   Specimen Description SPUTUM   Final   Special Requests Normal   Final   Sputum evaluation     Final   Value: MICROSCOPIC FINDINGS SUGGEST THAT THIS SPECIMEN IS NOT REPRESENTATIVE OF LOWER RESPIRATORY SECRETIONS. PLEASE RECOLLECT.     CALLED TO RN J.THACKER AT 0120 04/27/13 BY L.PITT   Report Status 04/27/2013 FINAL   Final  FUNGUS CULTURE W SMEAR     Status: None   Collection Time    04/27/13  6:31 AM      Result Value Range Status   Specimen Description TRACHEAL ASPIRATE   Final   Special Requests Normal   Final   Fungal Smear     Final   Value: NO YEAST OR FUNGAL ELEMENTS SEEN     Performed at [facility]   Culture     Final   Value: CULTURE IN PROGRESS FOR FOUR WEEKS     Performed at [facility]   Report Status PENDING   Incomplete  CULTURE, RESPIRATORY (NON-EXPECTORATED)     Status: None   Collection Time    04/27/13  6:54 AM      Result Value Range Status   Specimen Description TRACHEAL ASPIRATE   Final   Special Requests Normal   Final   Gram Stain     Final   Value: RARE WBC  PRESENT, PREDOMINANTLY PMN     FEW SQUAMOUS EPITHELIAL CELLS PRESENT     FEW GRAM POSITIVE COCCI IN CLUSTERS     Performed at [facility]   Culture     Final   Value: Culture reincubated for better growth     Performed at [facility]   Report Status PENDING   Incomplete    RADIOLOGY STUDIES/RESULTS: Dg Chest 2 View  04/25/2013   *RADIOLOGY REPORT*  Clinical Data: Productive cough, weakness, shortness of breath  CHEST - 2 VIEW  Comparison: Chest radiograph 03/28/2013, 03/12/2012, and 07/01/2010  Findings:   The heart, mediastinal, hilar contours are stable.  Lung volumes appear normal.  There is extensive abnormal peribronchial thickening that appears similar to a chest radiograph of July 2014 and progressive since the chest radiographs of June  2013 and July 2011.  Patchy airspace opacities in the peripheral right upper and lower lung and the left mid lung appears similar to chest radiograph of 18-1013 and March 28, 2013.  Negative for pleural effusion.  No acute bony abnormality.  IMPRESSION: Extensive abnormal peribronchial thickening and persistent, chronic appearing focal airspace opacities.  Potential considerations include chronic infections eosinophilic pneumonia, bronchiolitis obliterans organizing pneumonia, drug-induced lung disease, or chronic mycobacterium avium intracelluare infection.  Given the chronicity of these findings and the patient's continued symptoms, further evaluation with chest CT could be considered.  This could be performed without intravenous contrast.   Original Report Authenticated By: Britta Mccreedy, M.D.   Dg Chest 2 View  03/28/2013   *RADIOLOGY REPORT*  Clinical Data: Cough and weakness.  CHEST - 2 VIEW  Comparison: Two-view chest 03/12/2012.  Findings: New interstitial and airspace disease is present in the right middle lobe and upper lobes bilaterally.  The heart size is normal.  No definite to effusions are present.  IMPRESSION:  1.  New bilateral pneumonia. 2.  Recommend follow-up chest radiographs to assure clearing.   Original Report Authenticated By: Marin Roberts, M.D.   Dg Thoracic Spine W/swimmers  04/18/2013   *RADIOLOGY REPORT*  Clinical Data: Upper back pain for a few weeks.  No known injury.  THORACIC SPINE - 2 VIEW + SWIMMERS  Comparison: Chest radiograph 03/28/2013 and 03/12/2012  Findings: The thoracic spine vertebral bodies are normal in height and alignment.  There are mild typical degenerative changes. No fracture or suspicious bony lesion is identified.  IMPRESSION: No acute bony abnormality.   Original Report Authenticated By: Britta Mccreedy, M.D.   Ct Angio Chest Pe W/cm &/or Wo Cm  04/25/2013   *RADIOLOGY REPORT*  Clinical Data: Cough, sputum production, shortness of breath, generalized  weakness, history of COPD, evaluate for pulmonary embolism  CT ANGIOGRAPHY CHEST  Technique:  Multidetector CT imaging of the chest using the standard protocol during bolus administration of intravenous contrast. Multiplanar reconstructed images including MIPs were obtained and reviewed to evaluate the vascular anatomy.  Contrast: OMNIPAQUE IOHEXOL 350 MG/ML SOLN  Comparison: Chest radiograph - earlier same day; 03/28/2013  Vascular Findings:  There is adequate opacification of the pulmonary arterial system of the main pulmonary artery measuring 524 HU.  There are no discrete filling defects within the pulmonary arterial tree to suggest pulmonary embolism.  Normal caliber of the main pulmonary artery.  Normal heart size.  Calcifications within the mitral valve annulus. Normal caliber of the thoracic aorta.  Conventional configuration of the aortic arch.  The major branch vessels of the aortic arch widely patent.  Incidental note is made of  a ductus diverticulum. No thoracic aortic dissection or periaortic stranding.  -----------------------------------------------------------  Nonvascular findings:  There are consolidative airspace opacities primarily involving the inferior segment of the lingula and to a lesser extent, the lateral segment of the right middle lobe with associated air bronchograms and peripheral areas of geographic bronchiectasis.  There are minimal consolidative opacities within the right costophrenic angle and subpleural aspect of the right upper lobe (image 56, series six).  There are extensive basilar predominant tree in bud opacities within the bilateral lower lobes with scattered areas of geographic bronchiectasis.  The pulmonary airways are patent.  Note is made of this slight mosaic attenuation of the pulmonary parenchyma.  There is an indeterminate approximate 1.5 x 1.4 cm nodule within the left lower lobe (image 85, series six).  There is mediastinal and right hilar lymphadenopathy  with index precarinal node conglomeration measuring approximately 1.1 cm and a short axis diameter (image 50 for, series four) and index right hilar partially calcified node conglomeration measuring approximately 1.6 cm (image 66).   Scattered additional prevascular and left hilar lymph nodes, not enlarged by CT criteria.  No axillary lymphadenopathy.  Early arterial phase evaluation of the upper abdomen demonstrates a hypoattenuating sub centimeter (approximately 0.6 cm) lesion within the dome of the left lobe of the liver (image 125, series 4) too small to accurate characterize lobe possibly representing a hepatic cyst.  Post cholecystectomy.  There is mild diffuse thickening of bilateral adrenal gland without discrete nodule.  No acute or aggressive osseous abnormalities.  Normal appearance of the thyroid gland.  IMPRESSION:  1.  Negative for pulmonary embolism. 2.  Overall findings worrisome for multifocal infection, worse within the lingula and right middle lobe, with associated consolidative opacities and areas of bronchiectasis - as such, atypical etiologies (such as MAC) should be considered. 3.  Extensive basilar predominant tree in bud opacities are worrisome for airways disseminated infection and/or aspiration.  4. Slight mosaic attenuation of the pulmonary parenchyma is suggestive of airways disease.  5.  Nonspecific mediastinal and right hilar lymphadenopathy, presumably reactive in etiology.  6.  Indeterminate approximately 1.5 cm nodule within the left lower lobe.  While possibly a discrete nodular area of infection, an underlying nodule is not excluded and as such, a follow-up chest CT in 6 weeks after treatment is recommended for further evaluation.   Original Report Authenticated By: Tacey Ruiz, MD    Gerrit Friends, PA-S [facility]  If 7PM-7AM, please contact night-coverage [email or web address] Password [email or web address] 04/28/2013, 10:30 AM   LOS: 3 days     Patient seen and examined,  database reviewed. Agree with above note. Case discussed with Dr. Daiva Eves. Has had good response to antibiotics so far. Plan to narrow antibiotics to levaquin for a full 2 weeks of treatment. Will need to follow up on cx data. Can likely go home in am.  Peggye Pitt, MD [facility] Pager: [phone]

## 2013-04-28 NOTE — Progress Notes (Signed)
Patient evaluated for community based chronic disease management services with [facility] Care Management Program as a benefit of patient's [employer].  Spoke with patient at bedside to explain [facility] Care Management services. She was appreciative of the visit but would like to discuss the services with her husband before accepting.  She agreed to Higher education careers adviser or our office for services based on her decision.  Left contact information and [facility] literature at bedside. Made inpatient Case Manager Letha Cape RN aware that [facility] Care Management had consulted and has been declined at this time. Of note, [facility] Care Management services does not replace or interfere with any services that are arranged by inpatient case management or social work.  For additional questions or referrals please contact Anibal Henderson BSN RN [facility] Hospital Liaison at [phone].

## 2013-04-28 NOTE — Progress Notes (Signed)
INFECTIOUS DISEASE ATTENDING ADDENDUM:     [facility]   Date: 04/28/2013  Patient name: Stacie Wright  Medical record number: [id number]  Date of birth: 1928-05-22    This patient has been seen and discussed with the house staff. Please see their note for complete details. I concur with their findings with the following additions/corrections:  Patient continues to improve will change to anti-pseudomonal dosing of levaquin at 750mg  daily and plan on 11 more days of theraqpy IF she tolerate this abx well overnight with no steps backward and without confusion from FQ  One could even consider going to 21 days for "bronchiecasis" pseudomonal regimen but I worry about risk of CDI  We will be happy to fu in our clinic  [employer] 04/28/2013, 2:20 PM

## 2013-04-29 LAB — BASIC METABOLIC PANEL
CO2: 27 mEq/L (ref 19–32)
Calcium: 8.9 mg/dL (ref 8.4–10.5)
GFR calc non Af Amer: 77 mL/min — ABNORMAL LOW (ref >90)
Potassium: 4 mEq/L (ref 3.5–5.1)
Sodium: 133 mEq/L — ABNORMAL LOW (ref 135–145)

## 2013-04-29 LAB — CULTURE, RESPIRATORY W GRAM STAIN: Special Requests: NORMAL

## 2013-04-29 LAB — CBC
Hemoglobin: 13.8 g/dL (ref 12.0–15.0)
Platelets: 254 10*3/uL (ref 150–400)
RBC: 4.41 MIL/uL (ref 3.87–5.11)
WBC: 15.2 10*3/uL — ABNORMAL HIGH (ref 4.0–10.5)

## 2013-04-29 MED ORDER — LEVOFLOXACIN 750 MG PO TABS: 750.0000 mg | ORAL_TABLET | Freq: Every day | ORAL | Status: AC

## 2013-04-29 MED ORDER — ALBUTEROL SULFATE (5 MG/ML) 0.5% IN NEBU
INHALATION_SOLUTION | RESPIRATORY_TRACT | Status: AC
  Filled 2013-04-29: qty 0.5

## 2013-04-29 MED ORDER — PREDNISONE (PAK) 10 MG PO TABS: 10.0000 mg | ORAL_TABLET | Freq: Every day | ORAL | Status: DC

## 2013-04-29 MED ORDER — IPRATROPIUM BROMIDE 0.02 % IN SOLN
RESPIRATORY_TRACT | Status: AC
  Filled 2013-04-29: qty 2.5

## 2013-04-29 NOTE — Progress Notes (Signed)
PT Cancellation Note  Patient Details Name: Stacie Wright MRN: [id number] DOB: 1928-08-11   Cancelled Treatment:    Reason Eval/Treat Not Completed:  (Pt declined, Anticipating DC home shortly ) Pt and husband both state they feel comfortable with mobility. Encouraged use of RW at home until mobility back to baseline.  Will f/u Monday if not DC'd home as anticipated.   Donnella Sham 04/29/2013, 11:59 AM

## 2013-04-29 NOTE — Progress Notes (Signed)
INFECTIOUS DISEASE ATTENDING ADDENDUM:     [facility]   Date: 04/29/2013  Patient name: Stacie Wright  Medical record number: [id number]  Date of birth: 1927/11/28    This patient has been seen and discussed with the house staff. Please see their note for complete details. I concur with their findings with the following additions/corrections:  Patient doing well and ready for DC  She should fu with Dr. Juanetta Gosling and consider prior to stopping her abx  [name] 04/29/2013, 11:32 AM

## 2013-04-29 NOTE — Discharge Summary (Addendum)
PATIENT DETAILS Name: Stacie Wright Age: 79 y.o. Sex: female Date of Birth: 1927/12/21 MRN: [id number]. Admit Date: 04/25/2013 Admitting Physician: Clydia Llano, MD QVZ:DGLOVFI,EPPIRJ L, MD  Recommendations for Outpatient Follow-up:  1. Follow up with Kari Baars, MD next week to re-check Sodium and WBC, for hypertension, and for constipation. 2. Follow up with [facility] after antibiotic treatment is complete for lung nodule and chronic lung changes.  PRIMARY DISCHARGE DIAGNOSIS:  Principal Problem:   Acute bronchitis Active Problems:   Abnormal chest x-ray   Hypertension   Generalized weakness   Hypoxia   Pneumonia, organism unspecified   COPD exacerbation   Bronchiectasis without acute exacerbation   Pulmonary nodule   Unspecified constipation      PAST MEDICAL HISTORY: Past Medical History  Diagnosis Date  . Hypertension   . COPD (chronic obstructive pulmonary disease)   . Anxiety   . Shortness of breath   . Pneumonia 04/2013  . Headache(784.0)   . Arthritis     DISCHARGE MEDICATIONS:   Medication List    STOP taking these medications       predniSONE 5 MG tablet  Commonly known as:  DELTASONE  Replaced by:  predniSONE 10 MG tablet      TAKE these medications       allopurinol 300 MG tablet  Commonly known as:  ZYLOPRIM  Take 300 mg by mouth daily.     aspirin EC 81 MG tablet  Take 81 mg by mouth daily.     atenolol 50 MG tablet  Commonly known as:  TENORMIN  Take 50 mg by mouth daily.     beta carotene w/minerals tablet  Take 1 tablet by mouth daily.     COMBIGAN 0.2-0.5 % ophthalmic solution  Generic drug:  brimonidine-timolol  Place 1 drop into both eyes every 12 (twelve) hours.     dorzolamide 2 % ophthalmic solution  Commonly known as:  TRUSOPT  Place 1 drop into both eyes 2 (two) times daily.     hyoscyamine 0.375 MG 12 hr tablet  Commonly known as:  LEVBID  Take 0.1875 mg by mouth every 12 (twelve) hours as needed  for cramping.     latanoprost 0.005 % ophthalmic solution  Commonly known as:  XALATAN  Place 1 drop into both eyes at bedtime.     levofloxacin 750 MG tablet  Commonly known as:  LEVAQUIN  Take 1 tablet (750 mg total) by mouth daily.     PARoxetine 20 MG tablet  Commonly known as:  PAXIL  Take 20 mg by mouth every morning.     predniSONE 10 MG tablet  Commonly known as:  STERAPRED UNI-PAK  Take 1 tablet (10 mg total) by mouth daily. Take as directed     triamterene-hydrochlorothiazide 37.5-25 MG per tablet  Commonly known as:  MAXZIDE-25  Take 1 tablet by mouth every other day.        ALLERGIES:   Allergies  Allergen Reactions  . Amoxicillin Anaphylaxis and Rash  . Penicillins Anaphylaxis and Rash  . Sulfa Antibiotics Anaphylaxis and Rash  . Darvocet [Propoxyphene-Acetaminophen]     hallucinations    BRIEF HPI:  See H&P, Labs, Consult and Test reports for all details. In brief, patient was admitted for pneumonia that did not respond to outpatient antibiotics, SOB, Hypoxemia, weakness, pulmonary nodule, and acute bronchitis.   CONSULTATIONS:   Pulmonology Infectious Disease  PERTINENT RADIOLOGIC STUDIES: Dg Chest 2 View  04/25/2013   *RADIOLOGY REPORT*  Clinical Data: Productive cough, weakness, shortness of breath  CHEST - 2 VIEW  Comparison: Chest radiograph 03/28/2013, 03/12/2012, and 07/01/2010  Findings:   The heart, mediastinal, hilar contours are stable.  Lung volumes appear normal.  There is extensive abnormal peribronchial thickening that appears similar to a chest radiograph of July 2014 and progressive since the chest radiographs of June 2013 and July 2011.  Patchy airspace opacities in the peripheral right upper and lower lung and the left mid lung appears similar to chest radiograph of 18-1013 and March 28, 2013.  Negative for pleural effusion.  No acute bony abnormality.  IMPRESSION: Extensive abnormal peribronchial thickening and persistent, chronic  appearing focal airspace opacities.  Potential considerations include chronic infections eosinophilic pneumonia, bronchiolitis obliterans organizing pneumonia, drug-induced lung disease, or chronic mycobacterium avium intracelluare infection.  Given the chronicity of these findings and the patient's continued symptoms, further evaluation with chest CT could be considered.  This could be performed without intravenous contrast.   Original Report Authenticated By: Britta Mccreedy, M.D.   Dg Thoracic Spine W/swimmers  04/18/2013   *RADIOLOGY REPORT*  Clinical Data: Upper back pain for a few weeks.  No known injury.  THORACIC SPINE - 2 VIEW + SWIMMERS  Comparison: Chest radiograph 03/28/2013 and 03/12/2012  Findings: The thoracic spine vertebral bodies are normal in height and alignment.  There are mild typical degenerative changes. No fracture or suspicious bony lesion is identified.  IMPRESSION: No acute bony abnormality.   Original Report Authenticated By: Britta Mccreedy, M.D.   Ct Angio Chest Pe W/cm &/or Wo Cm  04/25/2013   *RADIOLOGY REPORT*  Clinical Data: Cough, sputum production, shortness of breath, generalized weakness, history of COPD, evaluate for pulmonary embolism  CT ANGIOGRAPHY CHEST  Technique:  Multidetector CT imaging of the chest using the standard protocol during bolus administration of intravenous contrast. Multiplanar reconstructed images including MIPs were obtained and reviewed to evaluate the vascular anatomy.  Contrast: OMNIPAQUE IOHEXOL 350 MG/ML SOLN  Comparison: Chest radiograph - earlier same day; 03/28/2013  Vascular Findings:  There is adequate opacification of the pulmonary arterial system of the main pulmonary artery measuring 524 HU.  There are no discrete filling defects within the pulmonary arterial tree to suggest pulmonary embolism.  Normal caliber of the main pulmonary artery.  Normal heart size.  Calcifications within the mitral valve annulus. Normal caliber of the  thoracic aorta.  Conventional configuration of the aortic arch.  The major branch vessels of the aortic arch widely patent.  Incidental note is made of a ductus diverticulum. No thoracic aortic dissection or periaortic stranding.  -----------------------------------------------------------  Nonvascular findings:  There are consolidative airspace opacities primarily involving the inferior segment of the lingula and to a lesser extent, the lateral segment of the right middle lobe with associated air bronchograms and peripheral areas of geographic bronchiectasis.  There are minimal consolidative opacities within the right costophrenic angle and subpleural aspect of the right upper lobe (image 56, series six).  There are extensive basilar predominant tree in bud opacities within the bilateral lower lobes with scattered areas of geographic bronchiectasis.  The pulmonary airways are patent.  Note is made of this slight mosaic attenuation of the pulmonary parenchyma.  There is an indeterminate approximate 1.5 x 1.4 cm nodule within the left lower lobe (image 85, series six).  There is mediastinal and right hilar lymphadenopathy with index precarinal node conglomeration measuring approximately 1.1 cm and a short axis diameter (image 50 for, series four) and index right  hilar partially calcified node conglomeration measuring approximately 1.6 cm (image 66).   Scattered additional prevascular and left hilar lymph nodes, not enlarged by CT criteria.  No axillary lymphadenopathy.  Early arterial phase evaluation of the upper abdomen demonstrates a hypoattenuating sub centimeter (approximately 0.6 cm) lesion within the dome of the left lobe of the liver (image 125, series 4) too small to accurate characterize lobe possibly representing a hepatic cyst.  Post cholecystectomy.  There is mild diffuse thickening of bilateral adrenal gland without discrete nodule.  No acute or aggressive osseous abnormalities.  Normal appearance of  the thyroid gland.  IMPRESSION:  1.  Negative for pulmonary embolism. 2.  Overall findings worrisome for multifocal infection, worse within the lingula and right middle lobe, with associated consolidative opacities and areas of bronchiectasis - as such, atypical etiologies (such as MAC) should be considered. 3.  Extensive basilar predominant tree in bud opacities are worrisome for airways disseminated infection and/or aspiration.  4. Slight mosaic attenuation of the pulmonary parenchyma is suggestive of airways disease.  5.  Nonspecific mediastinal and right hilar lymphadenopathy, presumably reactive in etiology.  6.  Indeterminate approximately 1.5 cm nodule within the left lower lobe.  While possibly a discrete nodular area of infection, an underlying nodule is not excluded and as such, a follow-up chest CT in 6 weeks after treatment is recommended for further evaluation.   Original Report Authenticated By: Tacey Ruiz, MD     PERTINENT LAB RESULTS: CBC:  Recent Labs  04/27/13 0920 04/29/13 0829  WBC 13.9* 15.2*  HGB 13.8 13.8  HCT 40.2 41.1  PLT 226 254   CMET CMP     Component Value Date/Time   NA 133* 04/29/2013 0829   K 4.0 04/29/2013 0829   CL 96 04/29/2013 0829   CO2 27 04/29/2013 0829   GLUCOSE 114* 04/29/2013 0829   BUN 19 04/29/2013 0829   CREATININE 0.72 04/29/2013 0829   CALCIUM 8.9 04/29/2013 0829   PROT 7.2 04/25/2013 0815   ALBUMIN 3.1* 04/25/2013 0815   AST 22 04/25/2013 0815   ALT 22 04/25/2013 0815   ALKPHOS 66 04/25/2013 0815   BILITOT 0.4 04/25/2013 0815   GFRNONAA 77* 04/29/2013 0829   GFRAA 90* 04/29/2013 0829    GFR Estimated Creatinine Clearance: 53.7 ml/min (by C-G formula based on Cr of 0.72). No results found for this basename: LIPASE, AMYLASE,  in the last 72 hours No results found for this basename: CKTOTAL, CKMB, CKMBINDEX, TROPONINI,  in the last 72 hours No components found with this basename: POCBNP,  No results found for this basename: DDIMER,  in the  last 72 hours No results found for this basename: HGBA1C,  in the last 72 hours No results found for this basename: CHOL, HDL, LDLCALC, TRIG, CHOLHDL, LDLDIRECT,  in the last 72 hours No results found for this basename: TSH, T4TOTAL, FREET3, T3FREE, THYROIDAB,  in the last 72 hours No results found for this basename: VITAMINB12, FOLATE, FERRITIN, TIBC, IRON, RETICCTPCT,  in the last 72 hours Coags: No results found for this basename: PT, INR,  in the last 72 hours Microbiology: Recent Results (from the past 240 hour(s))  CULTURE, BLOOD (ROUTINE X 2)     Status: None   Collection Time    04/26/13  2:43 PM      Result Value Range Status   Specimen Description BLOOD ARM RIGHT   Final   Special Requests BOTTLES DRAWN AEROBIC ONLY 2CC   Final  Culture  Setup Time     Final   Value: 04/26/2013 22:40     Performed at [facility]   Culture     Final   Value:        BLOOD CULTURE RECEIVED NO GROWTH TO DATE CULTURE WILL BE HELD FOR 5 DAYS BEFORE ISSUING A FINAL NEGATIVE REPORT     Performed at [facility]   Report Status PENDING   Incomplete  CULTURE, BLOOD (ROUTINE X 2)     Status: None   Collection Time    04/26/13  2:47 PM      Result Value Range Status   Specimen Description BLOOD HAND RIGHT   Final   Special Requests BOTTLES DRAWN AEROBIC ONLY 3CC   Final   Culture  Setup Time     Final   Value: 04/26/2013 22:41     Performed at [facility]   Culture     Final   Value:        BLOOD CULTURE RECEIVED NO GROWTH TO DATE CULTURE WILL BE HELD FOR 5 DAYS BEFORE ISSUING A FINAL NEGATIVE REPORT     Performed at [facility]   Report Status PENDING   Incomplete  RESPIRATORY VIRUS PANEL     Status: None   Collection Time    04/26/13  4:16 PM      Result Value Range Status   Source - RVPAN NASOPHARYNGEAL   Final   Respiratory Syncytial Virus A NOT DETECTED   Final   Respiratory Syncytial Virus B NOT DETECTED   Final   Influenza A NOT DETECTED   Final    Influenza B NOT DETECTED   Final   Parainfluenza 1 NOT DETECTED   Final   Parainfluenza 2 NOT DETECTED   Final   Parainfluenza 3 NOT DETECTED   Final   Metapneumovirus NOT DETECTED   Final   Rhinovirus NOT DETECTED   Final   Adenovirus NOT DETECTED   Final   Influenza A H1 NOT DETECTED   Final   Influenza A H3 NOT DETECTED   Final   Comment: (NOTE)           Normal Reference Range for each Analyte: NOT DETECTED     Testing performed using the Lum[employer] xTAG Respiratory Viral Panel test     kit.     This test was developed and its performance characteristics determined     by [facility]. It has not been cleared or approved by the [place]     Food and Drug Administration. This test is used for clinical purposes.     It should not be regarded as investigational or for research. This     laboratory is certified under the Clinical Laboratory Improvement     Amendments of 1988 (CLIA) as qualified to perform high complexity     clinical laboratory testing.     Performed at [facility]  CULTURE, EXPECTORATED SPUTUM-ASSESSMENT     Status: None   Collection Time    04/26/13  4:26 PM      Result Value Range Status   Specimen Description SPUTUM   Final   Special Requests NONE   Final   Sputum evaluation     Final   Value: MICROSCOPIC FINDINGS SUGGEST THAT THIS SPECIMEN IS NOT REPRESENTATIVE OF LOWER RESPIRATORY SECRETIONS. PLEASE RECOLLECT.     CALLED TO RN J.THACKER BY L.PITT @2024  04/26/13   Report Status 04/26/2013 FINAL  Final  CULTURE, EXPECTORATED SPUTUM-ASSESSMENT     Status: None   Collection Time    04/26/13  6:32 PM      Result Value Range Status   Specimen Description SPUTUM   Final   Special Requests Normal   Final   Sputum evaluation     Final   Value: MICROSCOPIC FINDINGS SUGGEST THAT THIS SPECIMEN IS NOT REPRESENTATIVE OF LOWER RESPIRATORY SECRETIONS. PLEASE RECOLLECT.     CALLED TO RN J.THACKER AT 0120 04/27/13 BY L.PITT   Report Status 04/27/2013 FINAL    Final  AFB CULTURE WITH SMEAR     Status: None   Collection Time    04/27/13  6:28 AM      Result Value Range Status   Specimen Description TRACHEAL ASPIRATE   Final   Special Requests Normal   Final   ACID FAST SMEAR     Final   Value: NO ACID FAST BACILLI SEEN     Performed at [facility]   Culture     Final   Value: CULTURE WILL BE EXAMINED FOR 6 WEEKS BEFORE ISSUING A FINAL REPORT     Performed at [facility]   Report Status PENDING   Incomplete  FUNGUS CULTURE W SMEAR     Status: None   Collection Time    04/27/13  6:31 AM      Result Value Range Status   Specimen Description TRACHEAL ASPIRATE   Final   Special Requests Normal   Final   Fungal Smear     Final   Value: NO YEAST OR FUNGAL ELEMENTS SEEN     Performed at [facility]   Culture     Final   Value: CULTURE IN PROGRESS FOR FOUR WEEKS     Performed at [facility]   Report Status PENDING   Incomplete  CULTURE, RESPIRATORY (NON-EXPECTORATED)     Status: None   Collection Time    04/27/13  6:54 AM      Result Value Range Status   Specimen Description TRACHEAL ASPIRATE   Final   Special Requests Normal   Final   Gram Stain     Final   Value: RARE WBC PRESENT, PREDOMINANTLY PMN     FEW SQUAMOUS EPITHELIAL CELLS PRESENT     FEW GRAM POSITIVE COCCI IN CLUSTERS     Performed at [facility]   Culture     Final   Value: Non-Pathogenic Oropharyngeal-type Flora Isolated.     Performed at [facility]   Report Status 04/29/2013 FINAL   Final     BRIEF HOSPITAL COURSE:       SOB/Hypoxia SOB secondary to pneumonia.  She was started on Levaquin 8/11, for bronchitis, and sent for a chest CT.  Her outpatient prednisone was discontinued 8/11.  She had supportive measures of O2 PRN, mucolytics, bronchodilators, and antitussives throughout her hospital stay.  Pulmonology was consulted and started Solumedrol 40 mg IV Q8H on 8/12 and recommended mobilization by PT.  Solumedrol  was titrated down to 40 mg IV Q12H on 8/14, 40 mg IVE once on 8/15, and she will go home and finish her prednisone taper PO.  Pulmonology will follow her outpatient for her lung changes and nodule.  Multilobar Pneumonia She was given Rocephin (8/11) and Levaquin (8/11-8/12).  ABX coverage was broadened to Vancomycin + Aztreonam (8/12- 8/14), to include HCAP, while waiting for results of respiratory cultures.  Her tracheal aspirate was positive  for few GPC in clusters and other cultures were negative. Vancomycin and Aztreonam were DC'd (8/14) and Levaquin PO was restarted (8/14) . She is to take Levaquin 750 mg daily, for 11 more days, to total 2 full weeks of treatment.  Generalized Weakness This weakness is presumes 2/2 her infection and has shown improvement as her infection has improved. PT saw and evaluated and does not recommend follow up.   Constipation She has not had a BM since 8/9, but she is not having any abdominal discomfort and her BS have been normal.  She was given Miralax 17mg  daily on 8/12, 8/13, and 8/12 and got one dose of Senokot 17.2 mg on 8/14.  She plans to continue treatment at home and follow up with her PCP if needed.  Hypertension Her blood pressure has remained stable but elevated (last reading 165/91). She has been given Atenolol 50mg  daily (8/12-8/14) and was given one dose of Maxzide-25 (8/13).  Follow up with PCP outpatient.  Abnormal chest X-ray/Bronchiectasis/Pulmonary Nodule Follow up outpatient with [facility] after antibiotics are complete.    TODAY-DAY OF DISCHARGE:  Subjective:   Ai Sonnenfeld today has no SOB, cough or weakness.  She still has constipation, but states that she would "prefer to take care of it at home."  She feels well and is ready to go home today.  Objective:   Blood pressure 188/74, pulse 72, temperature 98.3 F (36.8 C), temperature source Oral, resp. rate 18, height 5\' 8"  (1.727 m), weight 73.2 kg (161 lb 6 oz), SpO2  100.00%.  Intake/Output Summary (Last 24 hours) at 04/29/13 1120 Last data filed at 04/29/13 0825  Gross per 24 hour  Intake 1952.5 ml  Output      0 ml  Net 1952.5 ml   Filed Weights   04/25/13 1525  Weight: 73.2 kg (161 lb 6 oz)    Exam Gen Exam: Awake and alert with clear speech.  Neck: Supple, No JVD.  Chest: diffuse coarse breath sounds, much improved CVS:RRR no MGR  Abdomen: SNT, NBS, non-distended, no organomegaly  Extremities: no edema, lower extremities warm to touch, no clubbing  Neurologic: Non Focal.    DISCHARGE CONDITION: Stable  DISPOSITION: Home   DISCHARGE INSTRUCTIONS:    Activity:  As tolerated with Full fall precautions use assistance as needed  Diet recommendation: Heart Healthy diet   Discharge Orders   Future Orders Complete By Expires   Call MD for:  persistant nausea and vomiting  As directed    Call MD for:  severe uncontrolled pain  As directed    Call MD for:  temperature >100.4  As directed    Diet - low sodium heart healthy  As directed    Increase activity slowly  As directed          Total Time spent on discharge equals 45 minutes.  Signed:  Gerrit Friends PA-S 04/29/2013 11:20 AM  Patient seen and examined today. Agree with above note by PA student. Patient will be discharged home today to complete a 14 day course of levaquin. Will need follow up with her PCP to ensure that her WBC count continues to trend down. Will also need repeat CT scan for followup of her pulmonary nodule.  Peggye Pitt, MD [facility] Pager: [phone]

## 2013-04-29 NOTE — Progress Notes (Signed)
[facility]    Subjective: Patient states she is feeling much better this morning. Continues to cough less and and sputum expectorated is clear in color which she says is back to her baseline. She does still note her constipation but has no other complaints. Denies fevers, chills, sweats, N/V/D.    Antibiotics:  Anti-infectives   Start     Dose/Rate Route Frequency Ordered Stop   04/28/13 1130  levofloxacin (LEVAQUIN) tablet 750 mg     750 mg Oral Daily 04/28/13 1040     04/27/13 0600  vancomycin (VANCOCIN) IVPB 750 mg/150 ml premix  Status:  Discontinued     750 mg 150 mL/hr over 60 Minutes Intravenous Every 12 hours 04/26/13 1743 04/28/13 1040   04/26/13 2000  aztreonam (AZACTAM) 1 g in dextrose 5 % 50 mL IVPB  Status:  Discontinued     1 g 100 mL/hr over 30 Minutes Intravenous Every 8 hours 04/26/13 1743 04/28/13 1040   04/26/13 1830  vancomycin (VANCOCIN) IVPB 1000 mg/200 mL premix     1,000 mg 200 mL/hr over 60 Minutes Intravenous  Once 04/26/13 1743 04/26/13 1927   04/25/13 1530  levofloxacin (LEVAQUIN) IVPB 750 mg  Status:  Discontinued     750 mg 100 mL/hr over 90 Minutes Intravenous Every 24 hours 04/25/13 1515 04/26/13 1659   04/25/13 1045  cefTRIAXone (ROCEPHIN) 1 g in dextrose 5 % 50 mL IVPB     1 g 100 mL/hr over 30 Minutes Intravenous  Once 04/25/13 1035 04/25/13 1125   04/25/13 1030  levofloxacin (LEVAQUIN) IVPB 750 mg     750 mg 100 mL/hr over 90 Minutes Intravenous  Once 04/25/13 1027 04/25/13 1302      Medications: Scheduled Meds: . albuterol  2.5 mg Nebulization Q6H  . albuterol      . allopurinol  300 mg Oral Daily  . aspirin EC  81 mg Oral Daily  . atenolol  50 mg Oral Daily  . beta carotene w/minerals  1 tablet Oral Daily  . brimonidine  1 drop Both Eyes BID   And  . timolol  1 drop Both Eyes BID  . dorzolamide  1 drop Both Eyes BID  . guaiFENesin  1,200 mg Oral BID  . heparin  5,000 Units Subcutaneous Q8H  .  ipratropium      . ipratropium  0.5 mg Nebulization Q6H  . latanoprost  1 drop Both Eyes QHS  . levofloxacin  750 mg Oral Daily  . methylPREDNISolone (SOLU-MEDROL) injection  40 mg Intravenous Q12H  . PARoxetine  20 mg Oral Daily  . polyethylene glycol  17 g Oral Daily  . senna  2 tablet Oral Daily  . triamterene-hydrochlorothiazide  1 tablet Oral QODAY   Continuous Infusions: . sodium chloride Stopped (04/29/13 0825)   PRN Meds:.acetaminophen, acetaminophen, benzonatate, guaiFENesin-dextromethorphan, morphine injection, ondansetron (ZOFRAN) IV, ondansetron   Objective: Weight change:   Intake/Output Summary (Last 24 hours) at 04/29/13 0854 Last data filed at 04/29/13 0825  Gross per 24 hour  Intake 1952.5 ml  Output      0 ml  Net 1952.5 ml   Blood pressure 165/91, pulse 86, temperature 98.3 F (36.8 C), temperature source Oral, resp. rate 18, height 5\' 8"  (1.727 m), weight 73.2 kg (161 lb 6 oz), SpO2 95.00%. Temp:  [97.4 F (36.3 C)-98.3 F (36.8 C)] 98.3 F (36.8 C) (08/15 0550) Pulse Rate:  [71-96] 86 (08/15 0550) Resp:  [18-20] 18 (08/15 0550)  BP: (159-166)/(80-91) 165/91 mmHg (08/15 0550) SpO2:  [95 %-97 %] 95 % (08/15 0550)  Physical Exam: General: Alert and awake, oriented x3, not in any acute distress. HEENT: anicteric sclera, pupils reactive to light and accommodation, EOMI CVS regular rate, normal r,  no murmur rubs or gallops Chest: diffuse rales, continued improvement in breath sounds Abdomen: soft nontender, nondistended, normal bowel sounds, Extremities: no  clubbing or edema noted bilaterally Skin: no rashes Lymph: no new lymphadenopathy Neuro: nonfocal  Lab Results:  Recent Labs  04/27/13 0920  WBC 13.9*  HGB 13.8  HCT 40.2  PLT 226    BMET  Recent Labs  04/27/13 0920  NA 128*  K 3.8  CL 91*  CO2 24  GLUCOSE 205*  BUN 18  CREATININE 0.68  CALCIUM 8.9    Micro Results: Recent Results (from the past 240 hour(s))  CULTURE,  BLOOD (ROUTINE X 2)     Status: None   Collection Time    04/26/13  2:43 PM      Result Value Range Status   Specimen Description BLOOD ARM RIGHT   Final   Special Requests BOTTLES DRAWN AEROBIC ONLY 2CC   Final   Culture  Setup Time     Final   Value: 04/26/2013 22:40     Performed at [facility]   Culture     Final   Value:        BLOOD CULTURE RECEIVED NO GROWTH TO DATE CULTURE WILL BE HELD FOR 5 DAYS BEFORE ISSUING A FINAL NEGATIVE REPORT     Performed at [facility]   Report Status PENDING   Incomplete  CULTURE, BLOOD (ROUTINE X 2)     Status: None   Collection Time    04/26/13  2:47 PM      Result Value Range Status   Specimen Description BLOOD HAND RIGHT   Final   Special Requests BOTTLES DRAWN AEROBIC ONLY 3CC   Final   Culture  Setup Time     Final   Value: 04/26/2013 22:41     Performed at [facility]   Culture     Final   Value:        BLOOD CULTURE RECEIVED NO GROWTH TO DATE CULTURE WILL BE HELD FOR 5 DAYS BEFORE ISSUING A FINAL NEGATIVE REPORT     Performed at [facility]   Report Status PENDING   Incomplete  RESPIRATORY VIRUS PANEL     Status: None   Collection Time    04/26/13  4:16 PM      Result Value Range Status   Source - RVPAN NASOPHARYNGEAL   Final   Respiratory Syncytial Virus A NOT DETECTED   Final   Respiratory Syncytial Virus B NOT DETECTED   Final   Influenza A NOT DETECTED   Final   Influenza B NOT DETECTED   Final   Parainfluenza 1 NOT DETECTED   Final   Parainfluenza 2 NOT DETECTED   Final   Parainfluenza 3 NOT DETECTED   Final   Metapneumovirus NOT DETECTED   Final   Rhinovirus NOT DETECTED   Final   Adenovirus NOT DETECTED   Final   Influenza A H1 NOT DETECTED   Final   Influenza A H3 NOT DETECTED   Final   Comment: (NOTE)           Normal Reference Range for each Analyte: NOT DETECTED     Testing performed using the [employer] xTAG  Respiratory Viral Panel test     kit.     This test was developed and its  performance characteristics determined     by [facility]. It has not been cleared or approved by the [place]     Food and Drug Administration. This test is used for clinical purposes.     It should not be regarded as investigational or for research. This     laboratory is certified under the Clinical Laboratory Improvement     Amendments of 1988 (CLIA) as qualified to perform high complexity     clinical laboratory testing.     Performed at [facility]  CULTURE, EXPECTORATED SPUTUM-ASSESSMENT     Status: None   Collection Time    04/26/13  4:26 PM      Result Value Range Status   Specimen Description SPUTUM   Final   Special Requests NONE   Final   Sputum evaluation     Final   Value: MICROSCOPIC FINDINGS SUGGEST THAT THIS SPECIMEN IS NOT REPRESENTATIVE OF LOWER RESPIRATORY SECRETIONS. PLEASE RECOLLECT.     CALLED TO RN J.THACKER BY L.PITT @2024  04/26/13   Report Status 04/26/2013 FINAL   Final  CULTURE, EXPECTORATED SPUTUM-ASSESSMENT     Status: None   Collection Time    04/26/13  6:32 PM      Result Value Range Status   Specimen Description SPUTUM   Final   Special Requests Normal   Final   Sputum evaluation     Final   Value: MICROSCOPIC FINDINGS SUGGEST THAT THIS SPECIMEN IS NOT REPRESENTATIVE OF LOWER RESPIRATORY SECRETIONS. PLEASE RECOLLECT.     CALLED TO RN J.THACKER AT 0120 04/27/13 BY L.PITT   Report Status 04/27/2013 FINAL   Final  AFB CULTURE WITH SMEAR     Status: None   Collection Time    04/27/13  6:28 AM      Result Value Range Status   Specimen Description TRACHEAL ASPIRATE   Final   Special Requests Normal   Final   ACID FAST SMEAR     Final   Value: NO ACID FAST BACILLI SEEN     Performed at [facility]   Culture     Final   Value: CULTURE WILL BE EXAMINED FOR 6 WEEKS BEFORE ISSUING A FINAL REPORT     Performed at [facility]   Report Status PENDING   Incomplete  FUNGUS CULTURE W SMEAR     Status: None   Collection Time     04/27/13  6:31 AM      Result Value Range Status   Specimen Description TRACHEAL ASPIRATE   Final   Special Requests Normal   Final   Fungal Smear     Final   Value: NO YEAST OR FUNGAL ELEMENTS SEEN     Performed at [facility]   Culture     Final   Value: CULTURE IN PROGRESS FOR FOUR WEEKS     Performed at [facility]   Report Status PENDING   Incomplete  CULTURE, RESPIRATORY (NON-EXPECTORATED)     Status: None   Collection Time    04/27/13  6:54 AM      Result Value Range Status   Specimen Description TRACHEAL ASPIRATE   Final   Special Requests Normal   Final   Gram Stain     Final   Value: RARE WBC PRESENT, PREDOMINANTLY PMN     FEW SQUAMOUS EPITHELIAL CELLS PRESENT  FEW GRAM POSITIVE COCCI IN CLUSTERS     Performed at [facility]   Culture     Final   Value: Culture reincubated for better growth     Performed at [facility]   Report Status PENDING   Incomplete    Studies/Results: No results found.    Assessment/Plan: Stacie Wright is a 79 y.o. female with COPD (never smoker), and history of pneumonia who presented with SOB and productive cough. Overall patient has improved since admission on HCAP coverage. Fungal and AFB cultures with smear remain NGTD. Few GPC in clusters on respiratory culture.   Abx:  Rocephin (8/11)  Aztreonam (8/12>>8/14)  Vanc (8/12>>8/14)  Levaquin (8/11>>8/12), (8/14>>  Multifocal pneumonia:  --continue levaquin for anti-pseudo coverage until 05/09/13 for total of 2 weeks of coverage --blood cx NTD, tracheal aspirate positive few GPC in clusters  --Legionella urine Ag neg, Crypto Ag neg, ACE level WNL, Strep pna urinary Ag neg  --f/u remaining cultures    LOS: 4 days   Lewie Chamber 04/29/2013, 8:54 AM

## 2013-04-29 NOTE — Progress Notes (Signed)
Patient discharge teaching given, including activity, diet, follow-up appoints, and medications. Patient verbalized understanding of all discharge instructions. IV access was d/c'd. Vitals are stable. Skin is intact except as charted in most recent assessments. Pt to be escorted out by volunteer with wheelchair, to be driven home by family.

## 2013-05-02 LAB — CULTURE, BLOOD (ROUTINE X 2)

## 2013-05-03 LAB — HISTOPLASMA ANTIGEN, URINE: Histoplasma Antigen, urine: <0.5 ng/mL

## 2013-05-19 ENCOUNTER — Other Ambulatory Visit (HOSPITAL_COMMUNITY): Payer: Self-pay | Admitting: Pulmonary Disease

## 2013-05-19 ENCOUNTER — Telehealth (HOSPITAL_COMMUNITY): Payer: Self-pay

## 2013-05-19 ENCOUNTER — Telehealth: Payer: Self-pay | Admitting: *Deleted

## 2013-05-19 DIAGNOSIS — J189 Pneumonia, unspecified organism: Secondary | ICD-10-CM

## 2013-05-19 DIAGNOSIS — R1310 Dysphagia, unspecified: Secondary | ICD-10-CM

## 2013-05-19 NOTE — Telephone Encounter (Signed)
Tracheal Aspirate AFP Culture = +MAC.  No follow-up at [facility] scheduled.

## 2013-05-19 NOTE — Telephone Encounter (Signed)
WE can have her followup with [place] later this month, there are several openings tht I can see

## 2013-05-23 NOTE — Telephone Encounter (Signed)
Appt made with Dr Daiva Eves for 06/09/13 @ 10:00 for f/u of +MAC culture.  Pt verbalized understanding and wrote down the appt information.

## 2013-05-24 ENCOUNTER — Encounter (HOSPITAL_COMMUNITY): Payer: Medicare Other | Admitting: Speech Pathology

## 2013-05-24 ENCOUNTER — Other Ambulatory Visit (HOSPITAL_COMMUNITY): Payer: Medicare Other

## 2013-05-24 LAB — FUNGUS CULTURE W SMEAR
Fungal Smear: NONE SEEN
Special Requests: NORMAL

## 2013-06-09 ENCOUNTER — Ambulatory Visit (INDEPENDENT_AMBULATORY_CARE_PROVIDER_SITE_OTHER): Payer: Medicare Other | Admitting: Infectious Disease

## 2013-06-09 ENCOUNTER — Encounter: Payer: Self-pay | Admitting: Infectious Disease

## 2013-06-09 VITALS — BP 188/76 | HR 71 | Temp 97.9°F | Wt 165.0 lb

## 2013-06-09 DIAGNOSIS — T847XXS Infection and inflammatory reaction due to other internal orthopedic prosthetic devices, implants and grafts, sequela: Secondary | ICD-10-CM

## 2013-06-09 DIAGNOSIS — Z77120 Contact with and (suspected) exposure to mold (toxic): Secondary | ICD-10-CM

## 2013-06-09 DIAGNOSIS — L0390 Cellulitis, unspecified: Secondary | ICD-10-CM | POA: Insufficient documentation

## 2013-06-09 DIAGNOSIS — A318 Other mycobacterial infections: Secondary | ICD-10-CM

## 2013-06-09 DIAGNOSIS — J189 Pneumonia, unspecified organism: Secondary | ICD-10-CM

## 2013-06-09 DIAGNOSIS — T889XXS Complication of surgical and medical care, unspecified, sequela: Secondary | ICD-10-CM

## 2013-06-09 DIAGNOSIS — A310 Pulmonary mycobacterial infection: Secondary | ICD-10-CM

## 2013-06-09 DIAGNOSIS — T847XXA Infection and inflammatory reaction due to other internal orthopedic prosthetic devices, implants and grafts, initial encounter: Secondary | ICD-10-CM | POA: Insufficient documentation

## 2013-06-09 DIAGNOSIS — L0291 Cutaneous abscess, unspecified: Secondary | ICD-10-CM

## 2013-06-09 LAB — AFB CULTURE WITH SMEAR (NOT AT ARMC): Acid Fast Smear: NONE SEEN

## 2013-06-09 MED ORDER — DOXYCYCLINE HYCLATE 100 MG PO TABS: 100.0000 mg | ORAL_TABLET | Freq: Two times a day (BID) | ORAL | Status: DC

## 2013-06-09 NOTE — Progress Notes (Signed)
Subjective:    Patient ID: Stacie Wright, female    DOB: 09-28-1927, 79 y.o.   MRN: [id number]  HPI  79 y.o. female with  COPD but not a smoker and claims to not have had asthma. We saw her in the hospital for HCAP workup and she had CT showing:  " multifocal infection, worse  within the lingula and right middle lobe, with associated  consolidative opacities and areas of bronchiectasis - as such, atypical etiologies (such as MAC) should be considered. And  Extensive basilar predominant tree in bud opacities are  worrisome for airways disseminated infection and/or aspiration"   WE obtained sputum for AFB, bacterial cx adn Fungal cx. Resp cultures were with OPF. Her B. 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 She is finishing a course of levaquin for her pna with bronchiectasis type duration (21days). Along the way she injured her left lower extremity where she has hardware in place and had a draining area on her Carollee Herter. She was seen by Dr. Eulah Pont who apparently applied a type of "glue" to this area. There still substantial erythema which has progressed despite her continued use of levofloxacin.  Regards to her lungs and the consideration of Mycobacterium avium infection she denies weight loss or fevers she does have a daily cough although is not as productive as it has been in the past. She is without Medicare prescription drug plan which has been causing quite a bit of money simply by levofloxacin for example.    Review of Systems  Constitutional: Negative for fever, chills, diaphoresis, activity change, appetite change, fatigue and unexpected weight change.  HENT: Negative for congestion, sore throat, rhinorrhea, sneezing, trouble swallowing and sinus pressure.   Eyes: Negative for photophobia and visual disturbance.  Respiratory: Positive for cough. Negative for chest tightness, shortness of breath, wheezing  and stridor.   Cardiovascular: Negative for chest pain, palpitations and leg swelling.  Gastrointestinal: Negative for nausea, vomiting, abdominal pain, diarrhea, constipation, blood in stool, abdominal distention and anal bleeding.  Genitourinary: Negative for dysuria, hematuria, flank pain and difficulty urinating.  Musculoskeletal: Negative for myalgias, back pain, joint swelling, arthralgias and gait problem.  Skin: Positive for rash and wound. Negative for color change and pallor.  Neurological: Negative for dizziness, tremors, weakness and light-headedness.  Hematological: Negative for adenopathy. Does not bruise/bleed easily.  Psychiatric/Behavioral: Negative for behavioral problems, confusion, sleep disturbance, dysphoric mood, decreased concentration and agitation.       Objective:   Physical Exam  Constitutional: She is oriented to person, place, and time. She appears well-developed and well-nourished. No distress.  HENT:  Head: Normocephalic and atraumatic.  Mouth/Throat: Oropharynx is clear and moist. No oropharyngeal exudate.  Eyes: Conjunctivae and EOM are normal. Pupils are equal, round, and reactive to light. No scleral icterus.  Neck: Normal range of motion. Neck supple. No JVD present.  Cardiovascular: Normal rate, regular rhythm and normal heart sounds.  Exam reveals no gallop and no friction rub.   No murmur heard. Pulmonary/Chest: Effort normal. No respiratory distress. She has decreased breath sounds in the right lower field and the left lower field. She has no wheezes. She has rhonchi in the right lower field and the left lower field. She has no rales. She exhibits no tenderness.  Abdominal: She exhibits no distension and no mass. There is no tenderness. There is no rebound and no guarding.  Musculoskeletal: She exhibits no edema and no tenderness.  Legs: Lymphadenopathy:    She has no cervical adenopathy.  Neurological: She is alert and oriented to person,  place, and time. She has normal reflexes. She exhibits normal muscle tone. Coordination normal.  Skin: Skin is warm and dry. She is not diaphoretic. There is erythema. No pallor.  Psychiatric: She has a normal mood and affect. Her behavior is normal.          Assessment & Plan:   #1 Cellulitis overlying hardware in the leg: Given lack of response to levofloxacin which wouldn't cover streptococcal species I worry about methicillin-resistant staph aureus. I will start doxycycline 100 mg twice daily. We'll also obtain an MRI of his left lower extremity look for deeper infection and abscess and potential of osteomyelitis or hardware infection.  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 She is growing some her culture and she does have CT radiographic animality with a chronic cough. I am not anxious to start therapy this point time given we're dealing with an infection in her lower extremity and given her lack of prescription drug coverage given that this infection is typically treated for years.  I spent greater than 45 minutes with the patient including greater than 50% of time in face to face counsel of the patient and in coordination of their care.   We will however certainly consider treating her once we have gotten her through treatment for cellulitis. In the meantime I feel like her symptoms are fairly stable.   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  #4 healthcare associated pneumonia, bronchiectasis: This is improving again could be due to the fact that levofloxacin would have some activity against Mycobacterium avium.

## 2013-06-15 ENCOUNTER — Ambulatory Visit (HOSPITAL_COMMUNITY)
Admission: RE | Admit: 2013-06-15 | Discharge: 2013-06-15 | Disposition: A | Discharge status: Home / Self Care | Payer: Medicare Other | Source: Ambulatory Visit | Attending: Infectious Disease | Admitting: Infectious Disease

## 2013-06-15 DIAGNOSIS — R609 Edema, unspecified: Secondary | ICD-10-CM | POA: Insufficient documentation

## 2013-06-15 DIAGNOSIS — M7989 Other specified soft tissue disorders: Secondary | ICD-10-CM | POA: Insufficient documentation

## 2013-06-15 DIAGNOSIS — T847XXS Infection and inflammatory reaction due to other internal orthopedic prosthetic devices, implants and grafts, sequela: Secondary | ICD-10-CM

## 2013-06-15 DIAGNOSIS — L0390 Cellulitis, unspecified: Secondary | ICD-10-CM

## 2013-06-15 DIAGNOSIS — M79609 Pain in unspecified limb: Secondary | ICD-10-CM | POA: Insufficient documentation

## 2013-06-15 DIAGNOSIS — M19079 Primary osteoarthritis, unspecified ankle and foot: Secondary | ICD-10-CM | POA: Insufficient documentation

## 2013-06-15 MED ORDER — GADOBENATE DIMEGLUMINE 529 MG/ML IV SOLN
14.0000 mL | Freq: Once | INTRAVENOUS | Status: AC | PRN
  Administered 2013-06-15: 14 mL via INTRAVENOUS

## 2013-06-22 ENCOUNTER — Encounter: Payer: Self-pay | Admitting: Infectious Disease

## 2013-06-22 ENCOUNTER — Ambulatory Visit (INDEPENDENT_AMBULATORY_CARE_PROVIDER_SITE_OTHER): Payer: Medicare Other | Admitting: Infectious Disease

## 2013-06-22 VITALS — BP 188/74 | HR 77 | Temp 97.7°F | Wt 162.0 lb

## 2013-06-22 DIAGNOSIS — Z9889 Other specified postprocedural states: Secondary | ICD-10-CM

## 2013-06-22 DIAGNOSIS — L0390 Cellulitis, unspecified: Secondary | ICD-10-CM

## 2013-06-22 DIAGNOSIS — Z967 Presence of other bone and tendon implants: Secondary | ICD-10-CM

## 2013-06-22 DIAGNOSIS — A310 Pulmonary mycobacterial infection: Secondary | ICD-10-CM

## 2013-06-22 DIAGNOSIS — Z77120 Contact with and (suspected) exposure to mold (toxic): Secondary | ICD-10-CM

## 2013-06-22 DIAGNOSIS — L0291 Cutaneous abscess, unspecified: Secondary | ICD-10-CM

## 2013-06-22 NOTE — Progress Notes (Signed)
Subjective:    Patient ID: Stacie Wright, female    DOB: [birth year], 79 y.o.   MRN: [id number]  HPI   79 y.o. female with  COPD but not a smoker and claims to not have had asthma. We saw her in the hospital for HCAP workup and she had CT showing:  " multifocal infection, worse  within the lingula and right middle lobe, with associated  consolidative opacities and areas of bronchiectasis - as such, atypical etiologies (such as MAC) should be considered. And  Extensive basilar predominant tree in bud opacities are  worrisome for airways disseminated infection and/or aspiration"   WE obtained sputum for AFB, bacterial cx adn Fungal cx. Resp cultures were with OPF. Her B. 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 She is finishing a course of levaquin for her pna with bronchiectasis type duration (21days).    Along the way she injured her left lower extremity where she has hardware in place and had a draining area on her Carollee Herter. She was seen by Dr. Eulah Pont who apparently applied a type of "glue" to this area. There still substantial erythema which has progressed despite her continued use of levofloxacin at last appt.  I changed her to doxycyline and got MRI of the leg which showed:  IMPRESSION:  1. Distal fibula lateral plate and screw fixation. Artifact obscures  the distal lateral leg and portions of the ankle.  2. Left leg swelling and subcutaneous edema which may represent  dependent edema or cellulitis in the appropriate clinical setting.  There is no abscess. No visible osteomyelitis is present.  3. Because of the fixation hardware, CT without infusion may be  useful to assess for changes of chronic osteomyelitis.   Since then her erythema has subsided. She still has an area where the "glue was applied.   Regards to her lungs and the consideration of Mycobacterium avium infection she denied weight  loss or fevers she does have a nightly cough although is not as productive as it has been in the past.   She is without Medicare prescription drug plan which has been causing quite a bit of money simply by levofloxacin for example.      Review of Systems  Constitutional: Negative for fever, chills, diaphoresis, activity change, appetite change, fatigue and unexpected weight change.  HENT: Negative for congestion, rhinorrhea, sinus pressure, sneezing, sore throat and trouble swallowing.   Eyes: Negative for photophobia and visual disturbance.  Respiratory: Positive for cough. Negative for chest tightness, shortness of breath, wheezing and stridor.   Cardiovascular: Negative for chest pain, palpitations and leg swelling.  Gastrointestinal: Negative for nausea, vomiting, abdominal pain, diarrhea, constipation, blood in stool, abdominal distention and anal bleeding.  Genitourinary: Negative for dysuria, hematuria, flank pain and difficulty urinating.  Musculoskeletal: Negative for arthralgias, back pain, gait problem, joint swelling and myalgias.  Skin: Positive for wound. Negative for color change, pallor and rash.  Neurological: Negative for dizziness, tremors, weakness and light-headedness.  Hematological: Negative for adenopathy. Does not bruise/bleed easily.  Psychiatric/Behavioral: Negative for behavioral problems, confusion, sleep disturbance, dysphoric mood, decreased concentration and agitation.       Objective:   Physical Exam  Constitutional: She is oriented to person, place, and time. She appears well-developed and well-nourished. No distress.  HENT:  Head: Normocephalic and atraumatic.  Mouth/Throat: Oropharynx is clear and moist. No oropharyngeal exudate.  Eyes: Conjunctivae and EOM are normal. Pupils are equal,  round, and reactive to light. No scleral icterus.  Neck: Normal range of motion. Neck supple. No JVD present.  Cardiovascular: Normal rate, regular rhythm and normal  heart sounds.  Exam reveals no gallop and no friction rub.   No murmur heard. Pulmonary/Chest: Effort normal. No respiratory distress. She has decreased breath sounds in the right lower field and the left lower field. She has no wheezes. She has rhonchi in the right lower field and the left lower field. She has no rales. She exhibits no tenderness.  Abdominal: She exhibits no distension and no mass. There is no tenderness. There is no rebound and no guarding.  Musculoskeletal: She exhibits no edema and no tenderness.       Legs: Lymphadenopathy:    She has no cervical adenopathy.  Neurological: She is alert and oriented to person, place, and time. She has normal reflexes. She exhibits normal muscle tone. Coordination normal.  Skin: Skin is warm and dry. She is not diaphoretic. There is erythema. No pallor.  Psychiatric: She has a normal mood and affect. Her behavior is normal.          Assessment & Plan:   #1 Cellulitis overlying hardware in the leg: MRI was not as helpful due to hardware presence  I will observer her OFF abx and rtc in 2 months time  #2 Mavium intracellulare: patient may indeed have mycobacteria avium infection. She is growing some her culture and she does have CT radiographic animality with a chronic cough.   I remain reluctant to start therapy this point timegiven her lack of prescription drug coverage given that this infection is typically treated for years and that she does NOT have fevers, weight loss or more troublesome symptoms that would mandate rx  I spent greater than 45 minutes with the patient including greater than 50% of time in face to face counsel of the patient and in coordination of their care.    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

## 2013-08-22 ENCOUNTER — Ambulatory Visit: Payer: Medicare Other | Admitting: Infectious Disease

## 2014-11-16 ENCOUNTER — Other Ambulatory Visit (INDEPENDENT_AMBULATORY_CARE_PROVIDER_SITE_OTHER): Payer: Self-pay | Admitting: Otolaryngology

## 2014-11-16 ENCOUNTER — Ambulatory Visit (INDEPENDENT_AMBULATORY_CARE_PROVIDER_SITE_OTHER): Payer: Medicare HMO | Admitting: Otolaryngology

## 2014-11-16 DIAGNOSIS — R1310 Dysphagia, unspecified: Secondary | ICD-10-CM

## 2014-11-16 DIAGNOSIS — R1312 Dysphagia, oropharyngeal phase: Secondary | ICD-10-CM

## 2014-11-20 ENCOUNTER — Ambulatory Visit (HOSPITAL_COMMUNITY)
Admission: RE | Admit: 2014-11-20 | Discharge: 2014-11-20 | Disposition: A | Discharge status: Home / Self Care | Payer: Medicare HMO | Source: Ambulatory Visit | Attending: Otolaryngology | Admitting: Otolaryngology

## 2014-11-20 DIAGNOSIS — J449 Chronic obstructive pulmonary disease, unspecified: Secondary | ICD-10-CM | POA: Diagnosis not present

## 2014-11-20 DIAGNOSIS — K224 Dyskinesia of esophagus: Secondary | ICD-10-CM | POA: Insufficient documentation

## 2014-11-20 DIAGNOSIS — I10 Essential (primary) hypertension: Secondary | ICD-10-CM | POA: Insufficient documentation

## 2014-11-20 DIAGNOSIS — R1310 Dysphagia, unspecified: Secondary | ICD-10-CM | POA: Insufficient documentation

## 2015-01-15 ENCOUNTER — Encounter (HOSPITAL_COMMUNITY): Payer: Self-pay

## 2015-01-15 ENCOUNTER — Emergency Department (HOSPITAL_COMMUNITY): Payer: Medicare HMO

## 2015-01-15 ENCOUNTER — Inpatient Hospital Stay (HOSPITAL_COMMUNITY)
Admission: EM | Admit: 2015-01-15 | Discharge: 2015-01-19 | DRG: 194 | Disposition: A | Discharge status: Home Health | Payer: Medicare HMO | Attending: Pulmonary Disease | Admitting: Pulmonary Disease

## 2015-01-15 DIAGNOSIS — Z833 Family history of diabetes mellitus: Secondary | ICD-10-CM

## 2015-01-15 DIAGNOSIS — Z6824 Body mass index (BMI) 24.0-24.9, adult: Secondary | ICD-10-CM

## 2015-01-15 DIAGNOSIS — M1990 Unspecified osteoarthritis, unspecified site: Secondary | ICD-10-CM | POA: Diagnosis present

## 2015-01-15 DIAGNOSIS — R531 Weakness: Secondary | ICD-10-CM

## 2015-01-15 DIAGNOSIS — F411 Generalized anxiety disorder: Secondary | ICD-10-CM | POA: Diagnosis present

## 2015-01-15 DIAGNOSIS — J9611 Chronic respiratory failure with hypoxia: Secondary | ICD-10-CM | POA: Diagnosis present

## 2015-01-15 DIAGNOSIS — F329 Major depressive disorder, single episode, unspecified: Secondary | ICD-10-CM | POA: Diagnosis present

## 2015-01-15 DIAGNOSIS — J479 Bronchiectasis, uncomplicated: Secondary | ICD-10-CM

## 2015-01-15 DIAGNOSIS — J441 Chronic obstructive pulmonary disease with (acute) exacerbation: Secondary | ICD-10-CM

## 2015-01-15 DIAGNOSIS — E876 Hypokalemia: Secondary | ICD-10-CM | POA: Diagnosis present

## 2015-01-15 DIAGNOSIS — A310 Pulmonary mycobacterial infection: Secondary | ICD-10-CM | POA: Diagnosis present

## 2015-01-15 DIAGNOSIS — F0280 Dementia in other diseases classified elsewhere without behavioral disturbance: Secondary | ICD-10-CM | POA: Diagnosis present

## 2015-01-15 DIAGNOSIS — J470 Bronchiectasis with acute lower respiratory infection: Secondary | ICD-10-CM | POA: Diagnosis not present

## 2015-01-15 DIAGNOSIS — E46 Unspecified protein-calorie malnutrition: Secondary | ICD-10-CM | POA: Diagnosis present

## 2015-01-15 DIAGNOSIS — Z8249 Family history of ischemic heart disease and other diseases of the circulatory system: Secondary | ICD-10-CM

## 2015-01-15 DIAGNOSIS — K5900 Constipation, unspecified: Secondary | ICD-10-CM | POA: Diagnosis present

## 2015-01-15 DIAGNOSIS — R9389 Abnormal findings on diagnostic imaging of other specified body structures: Secondary | ICD-10-CM | POA: Diagnosis present

## 2015-01-15 DIAGNOSIS — Y95 Nosocomial condition: Secondary | ICD-10-CM | POA: Diagnosis present

## 2015-01-15 DIAGNOSIS — J471 Bronchiectasis with (acute) exacerbation: Secondary | ICD-10-CM | POA: Diagnosis present

## 2015-01-15 DIAGNOSIS — G309 Alzheimer's disease, unspecified: Secondary | ICD-10-CM | POA: Diagnosis present

## 2015-01-15 DIAGNOSIS — R112 Nausea with vomiting, unspecified: Secondary | ICD-10-CM | POA: Diagnosis not present

## 2015-01-15 DIAGNOSIS — Z8701 Personal history of pneumonia (recurrent): Secondary | ICD-10-CM | POA: Diagnosis not present

## 2015-01-15 DIAGNOSIS — Z880 Allergy status to penicillin: Secondary | ICD-10-CM | POA: Diagnosis not present

## 2015-01-15 DIAGNOSIS — R0902 Hypoxemia: Secondary | ICD-10-CM | POA: Diagnosis present

## 2015-01-15 DIAGNOSIS — I10 Essential (primary) hypertension: Secondary | ICD-10-CM | POA: Diagnosis present

## 2015-01-15 DIAGNOSIS — Z86718 Personal history of other venous thrombosis and embolism: Secondary | ICD-10-CM

## 2015-01-15 DIAGNOSIS — R938 Abnormal findings on diagnostic imaging of other specified body structures: Secondary | ICD-10-CM

## 2015-01-15 DIAGNOSIS — J1100 Influenza due to unidentified influenza virus with unspecified type of pneumonia: Secondary | ICD-10-CM | POA: Diagnosis present

## 2015-01-15 DIAGNOSIS — J189 Pneumonia, unspecified organism: Principal | ICD-10-CM | POA: Diagnosis present

## 2015-01-15 DIAGNOSIS — F32A Depression, unspecified: Secondary | ICD-10-CM | POA: Diagnosis present

## 2015-01-15 HISTORY — DX: Acute embolism and thrombosis of unspecified deep veins of unspecified lower extremity: I82.409

## 2015-01-15 HISTORY — DX: Gout, unspecified: M10.9

## 2015-01-15 HISTORY — DX: Irritable bowel syndrome, unspecified: K58.9

## 2015-01-15 HISTORY — DX: Pulmonary mycobacterial infection: A31.0

## 2015-01-15 HISTORY — DX: Bronchiectasis, uncomplicated: J47.9

## 2015-01-15 LAB — CBC WITH DIFFERENTIAL/PLATELET
BASOS ABS: 0 10*3/uL (ref 0.0–0.1)
BASOS PCT: 0 % (ref 0–1)
EOS ABS: 0.2 10*3/uL (ref 0.0–0.7)
Eosinophils Relative: 1 % (ref 0–5)
HCT: 42.5 % (ref 36.0–46.0)
HEMOGLOBIN: 13.9 g/dL (ref 12.0–15.0)
LYMPHS ABS: 1.8 10*3/uL (ref 0.7–4.0)
LYMPHS PCT: 9 % — AB (ref 12–46)
MCH: 32.1 pg (ref 26.0–34.0)
MCHC: 32.7 g/dL (ref 30.0–36.0)
MCV: 98.2 fL (ref 78.0–100.0)
Monocytes Absolute: 2 10*3/uL — ABNORMAL HIGH (ref 0.1–1.0)
Monocytes Relative: 10 % (ref 3–12)
Neutro Abs: 15.8 10*3/uL — ABNORMAL HIGH (ref 1.7–7.7)
Neutrophils Relative %: 80 % — ABNORMAL HIGH (ref 43–77)
PLATELETS: 220 10*3/uL (ref 150–400)
RBC: 4.33 MIL/uL (ref 3.87–5.11)
RDW: 14.6 % (ref 11.5–15.5)
WBC: 19.9 10*3/uL — AB (ref 4.0–10.5)

## 2015-01-15 LAB — COMPREHENSIVE METABOLIC PANEL
ALBUMIN: 3.2 g/dL — AB (ref 3.5–5.0)
ALK PHOS: 64 U/L (ref 38–126)
ALT: 20 U/L (ref 14–54)
AST: 20 U/L (ref 15–41)
Anion gap: 9 (ref 5–15)
BILIRUBIN TOTAL: 1.5 mg/dL — AB (ref 0.3–1.2)
BUN: 38 mg/dL — ABNORMAL HIGH (ref 6–20)
CO2: 32 mmol/L (ref 22–32)
CREATININE: 1.26 mg/dL — AB (ref 0.44–1.00)
Calcium: 8.7 mg/dL — ABNORMAL LOW (ref 8.9–10.3)
Chloride: 94 mmol/L — ABNORMAL LOW (ref 101–111)
GFR calc non Af Amer: 38 mL/min — ABNORMAL LOW (ref >60)
GFR, EST AFRICAN AMERICAN: 44 mL/min — AB (ref >60)
GLUCOSE: 126 mg/dL — AB (ref 70–99)
Potassium: 3.5 mmol/L (ref 3.5–5.1)
SODIUM: 135 mmol/L (ref 135–145)
Total Protein: 7.2 g/dL (ref 6.5–8.1)

## 2015-01-15 MED ORDER — SODIUM CHLORIDE 0.9 % IV SOLN
Freq: Once | INTRAVENOUS | Status: AC
  Administered 2015-01-15: 21:00:00 via INTRAVENOUS

## 2015-01-15 MED ORDER — DEXTROSE 5 % IV SOLN
500.0000 mg | INTRAVENOUS | Status: DC
  Administered 2015-01-15: 500 mg via INTRAVENOUS
  Filled 2015-01-15: qty 500

## 2015-01-15 MED ORDER — LEVOFLOXACIN IN D5W 500 MG/100ML IV SOLN
500.0000 mg | Freq: Once | INTRAVENOUS | Status: DC
  Filled 2015-01-15: qty 100

## 2015-01-15 MED ORDER — IPRATROPIUM-ALBUTEROL 0.5-2.5 (3) MG/3ML IN SOLN
3.0000 mL | Freq: Once | RESPIRATORY_TRACT | Status: AC
  Administered 2015-01-15: 3 mL via RESPIRATORY_TRACT
  Filled 2015-01-15: qty 3

## 2015-01-15 NOTE — ED Provider Notes (Signed)
CSN: [id number]     Arrival date & time 01/15/15  2048 History   First MD Initiated Contact with Patient 01/15/15 2059     Chief Complaint  Patient presents with  . Cough     (Consider location/radiation/quality/duration/timing/severity/associated sxs/prior Treatment) Patient is a 81 y.o. female presenting with cough. The history is provided by the patient (the pt complains of a cough and sob).  Cough Cough characteristics:  Productive Sputum characteristics:  Green Severity:  Severe Onset quality:  Sudden Timing:  Constant Progression:  Worsening Chronicity:  Recurrent Smoker: no   Context: not animal exposure   Associated symptoms: no chest pain, no eye discharge, no headaches and no rash     Past Medical History  Diagnosis Date  . Hypertension   . COPD (chronic obstructive pulmonary disease)   . Anxiety   . Shortness of breath   . Pneumonia 04/2013  . Headache(784.0)   . Arthritis   . MAI (mycobacterium avium-intracellulare) infection 2014  . Bronchiectasis 2014  . IBS (irritable bowel syndrome)   . Gout   . DVT (deep venous thrombosis) 2008    LLE   Past Surgical History  Procedure Laterality Date  . Abdominal hysterectomy    . Cholecystectomy    . Fracture surgery    . Cataract extraction     Family History  Problem Relation Age of Onset  . Diabetes Father   . Hypertension Father    History  Substance Use Topics  . Smoking status: Never Smoker   . Smokeless tobacco: Never Used  . Alcohol Use: No   OB History    No data available     Review of Systems  Constitutional: Negative for appetite change and fatigue.  HENT: Negative for congestion, ear discharge and sinus pressure.   Eyes: Negative for discharge.  Respiratory: Positive for cough.   Cardiovascular: Negative for chest pain.  Gastrointestinal: Negative for abdominal pain and diarrhea.  Genitourinary: Negative for frequency and hematuria.  Musculoskeletal: Negative for back pain.  Skin:  Negative for rash.  Neurological: Negative for seizures and headaches.  Psychiatric/Behavioral: Negative for hallucinations.      Allergies  Amoxicillin; Penicillins; Sulfa antibiotics; and Darvocet  Home Medications   Prior to Admission medications   Medication Sig Start Date End Date Taking? Authorizing Provider  allopurinol (ZYLOPRIM) 300 MG tablet Take 300 mg by mouth daily.   Yes Historical Provider, MD  aspirin EC 81 MG tablet Take 81 mg by mouth daily.   Yes Historical Provider, MD  atenolol (TENORMIN) 50 MG tablet Take 50 mg by mouth daily.   Yes Historical Provider, MD  beta carotene w/minerals (OCUVITE) tablet Take 1 tablet by mouth daily.   Yes Historical Provider, MD  brimonidine-timolol (COMBIGAN) 0.2-0.5 % ophthalmic solution Place 1 drop into both eyes every 12 (twelve) hours.   Yes Historical Provider, MD  dorzolamide (TRUSOPT) 2 % ophthalmic solution Place 1 drop into both eyes 2 (two) times daily.   Yes Historical Provider, MD  hyoscyamine (LEVBID) 0.375 MG 12 hr tablet Take 0.1875 mg by mouth every morning.    Yes Historical Provider, MD  latanoprost (XALATAN) 0.005 % ophthalmic solution Place 1 drop into both eyes at bedtime.   Yes Historical Provider, MD  PARoxetine (PAXIL) 20 MG tablet Take 20 mg by mouth every morning.   Yes Historical Provider, MD  triamterene-hydrochlorothiazide (MAXZIDE-25) 37.5-25 MG per tablet Take 1 tablet by mouth every other day.   Yes Historical Provider, MD  doxycycline (  VIBRA-TABS) 100 MG tablet Take 1 tablet (100 mg total) by mouth 2 (two) times daily. Patient not taking: Reported on 01/15/2015 06/09/13   Randall Hiss, MD  levofloxacin (LEVAQUIN) 500 MG tablet Take 500 mg by mouth daily. 10 DAY COURSE STARTING ON 01/08/2015 01/08/15   Historical Provider, MD  methylPREDNISolone (MEDROL DOSEPAK) 4 MG TBPK tablet Take 4-24 mg by mouth See admin instructions. 01/08/15   Historical Provider, MD  predniSONE (STERAPRED UNI-PAK) 10 MG tablet  Take 1 tablet (10 mg total) by mouth daily. Take as directed Patient not taking: Reported on 01/15/2015 04/29/13   Henderson Cloud, MD   BP 138/84 mmHg  Pulse 94  Temp(Src) 99.4 F (37.4 C) (Oral)  Resp 16  Ht  (1.727 m)  Wt 160 lb (72.576 kg)  BMI 24.33 kg/m2  SpO2 88% Physical Exam  Constitutional: She is oriented to person, place, and time. She appears well-developed.  HENT:  Head: Normocephalic.  Eyes: Conjunctivae and EOM are normal. No scleral icterus.  Neck: Neck supple. No thyromegaly present.  Cardiovascular: Normal rate and regular rhythm.  Exam reveals no gallop and no friction rub.   No murmur heard. Pulmonary/Chest: No stridor. She has wheezes. She has no rales. She exhibits no tenderness.  Abdominal: She exhibits no distension. There is no tenderness. There is no rebound.  Musculoskeletal: Normal range of motion. She exhibits no edema.  Lymphadenopathy:    She has no cervical adenopathy.  Neurological: She is oriented to person, place, and time. She exhibits normal muscle tone. Coordination normal.  Skin: No rash noted. No erythema.  Psychiatric: She has a normal mood and affect. Her behavior is normal.    ED Course  Procedures (including critical care time) Labs Review Labs Reviewed  CBC WITH DIFFERENTIAL/PLATELET - Abnormal; Notable for the following:    WBC 19.9 (*)    Neutrophils Relative % 80 (*)    Neutro Abs 15.8 (*)    Lymphocytes Relative 9 (*)    Monocytes Absolute 2.0 (*)    All other components within normal limits  COMPREHENSIVE METABOLIC PANEL - Abnormal; Notable for the following:    Chloride 94 (*)    Glucose, Bld 126 (*)    BUN 38 (*)    Creatinine, Ser 1.26 (*)    Calcium 8.7 (*)    Albumin 3.2 (*)    Total Bilirubin 1.5 (*)    GFR calc non Af Amer 38 (*)    GFR calc Af Amer 44 (*)    All other components within normal limits  CULTURE, BLOOD (ROUTINE X 2)  CULTURE, BLOOD (ROUTINE X 2)    Imaging Review Dg Chest 2  View  01/15/2015   CLINICAL DATA:  Cough.  Productive cough with brown sputum.  EXAM: CHEST  2 VIEW  COMPARISON:  Chest radiograph 04/25/2013.  Chest CTA 04/25/2013.  FINDINGS: Progressive consolidation in the lingula and RIGHT middle lobe evident on the lateral view. Diffuse interstitial prominence is present with a reticulonodular pattern most pronounced at the bases. The cardiopericardial silhouette is within normal limits. Mediastinal contours are normal. No pleural effusion.  IMPRESSION: Progressive RIGHT middle lobe and lingular consolidation. In this age group, this most likely represents atypical infection/mycobacterium avium. Superimposed bacterial pneumonia is in the differential considerations.   Electronically Signed   By: Andreas Newport M.D.   On: 01/15/2015 22:19     EKG Interpretation None      MDM   Final diagnoses:  Community acquired pneumonia    Admit for pneumonia    Bethann Berkshire, MD 01/15/15 2242

## 2015-01-15 NOTE — ED Notes (Signed)
Patient has has a productive cough X1 week. Per family "it was green and now it looks like blood" patient spitting brown thick sputum in triage.

## 2015-01-15 NOTE — H&P (Signed)
[facility] History and Physical  Stacie Wright ZOX:[id number] DOB: [birth year]    PCP:   Fredirick MaudlinHAWKINS,EDWARD L, MD   Chief Complaint: persistent yellow green sputum productive coughs.   HPI: Stacie Wright is an 81 y.o. female with hx of recurrent PNA, with COPD and bronchiectasis, hx of MAI infection last year, anxiety, nonsmoker, presented to the ER as she has progressively feeling more SOB, persistent coughing, and feeling weak.  She had coughs for many months, almost a year according to her husband.  She recently was placed on a course of steroid, along with antibiotics, but her symptoms persisted.  She denied fever or chills.  In the ER, she was noted to be hypoxic, with Sat in the high 80's, and she was given supplemental oxygen.  She has supposingly anaphylaxis with PCN, but she has been able to take Keflex previously. She had some blood tingued sputum tonight as well.  Evaluation in the ER with CXR showing atypical infiltrate, unclear if bacterial PNA, or atypical infection like MAI. Her WBC is elevated to 19K, and her Hb was normal.  She has a Cr of 1.26.  Hopsitalist was asked to admit her for pneumonia.   Rewiew of Systems:  Constitutional: Negative for malaise, fever and chills. No significant weight loss or weight gain Eyes: Negative for eye pain, redness and discharge, diplopia, visual changes, or flashes of light. ENMT: Negative for ear pain, hoarseness, nasal congestion, sinus pressure and sore throat. No headaches; tinnitus, drooling, or problem swallowing. Cardiovascular: Negative for chest pain, palpitations, diaphoresis, dyspnea and peripheral edema. ; No orthopnea, PND Respiratory: Negative for  wheezing and stridor. No pleuritic chestpain. Gastrointestinal: Negative for nausea, vomiting, diarrhea, constipation, abdominal pain, melena, blood in stool, hematemesis, jaundice and rectal bleeding.    Genitourinary: Negative for frequency, dysuria, incontinence,flank pain and  hematuria; Musculoskeletal: Negative for back pain and neck pain. Negative for swelling and trauma.;  Skin: . Negative for pruritus, rash, abrasions, bruising and skin lesion.; ulcerations Neuro: Negative for headache, lightheadedness and neck stiffness. Negative for weakness, altered level of consciousness , altered mental status, extremity weakness, burning feet, involuntary movement, seizure and syncope.  Psych: negative for anxiety, depression, insomnia, tearfulness, panic attacks, hallucinations, paranoia, suicidal or homicidal ideation    Past Medical History  Diagnosis Date  . Hypertension   . COPD (chronic obstructive pulmonary disease)   . Anxiety   . Shortness of breath   . Pneumonia 04/2013  . Headache(784.0)   . Arthritis   . MAI (mycobacterium avium-intracellulare) infection 2014  . Bronchiectasis 2014  . IBS (irritable bowel syndrome)   . Gout   . DVT (deep venous thrombosis) 2008    LLE    Past Surgical History  Procedure Laterality Date  . Abdominal hysterectomy    . Cholecystectomy    . Fracture surgery    . Cataract extraction      Medications:  HOME MEDS: Prior to Admission medications   Medication Sig Start Date End Date Taking? Authorizing Provider  allopurinol (ZYLOPRIM) 300 MG tablet Take 300 mg by mouth daily.   Yes Historical Provider, MD  aspirin EC 81 MG tablet Take 81 mg by mouth daily.   Yes Historical Provider, MD  atenolol (TENORMIN) 50 MG tablet Take 50 mg by mouth daily.   Yes Historical Provider, MD  beta carotene w/minerals (OCUVITE) tablet Take 1 tablet by mouth daily.   Yes Historical Provider, MD  brimonidine-timolol (COMBIGAN) 0.2-0.5 % ophthalmic solution Place 1 drop into  both eyes every 12 (twelve) hours.   Yes Historical Provider, MD  dorzolamide (TRUSOPT) 2 % ophthalmic solution Place 1 drop into both eyes 2 (two) times daily.   Yes Historical Provider, MD  hyoscyamine (LEVBID) 0.375 MG 12 hr tablet Take 0.1875 mg by mouth every  morning.    Yes Historical Provider, MD  latanoprost (XALATAN) 0.005 % ophthalmic solution Place 1 drop into both eyes at bedtime.   Yes Historical Provider, MD  PARoxetine (PAXIL) 20 MG tablet Take 20 mg by mouth every morning.   Yes Historical Provider, MD  triamterene-hydrochlorothiazide (MAXZIDE-25) 37.5-25 MG per tablet Take 1 tablet by mouth every other day.   Yes Historical Provider, MD  doxycycline (VIBRA-TABS) 100 MG tablet Take 1 tablet (100 mg total) by mouth 2 (two) times daily. Patient not taking: Reported on 01/15/2015 06/09/13   Randall Hiss, MD  levofloxacin (LEVAQUIN) 500 MG tablet Take 500 mg by mouth daily. 10 DAY COURSE STARTING ON 01/08/2015 01/08/15   Historical Provider, MD  methylPREDNISolone (MEDROL DOSEPAK) 4 MG TBPK tablet Take 4-24 mg by mouth See admin instructions. 01/08/15   Historical Provider, MD  predniSONE (STERAPRED UNI-PAK) 10 MG tablet Take 1 tablet (10 mg total) by mouth daily. Take as directed Patient not taking: Reported on 01/15/2015 04/29/13   Henderson Cloud, MD     Allergies:  Allergies  Allergen Reactions  . Amoxicillin Anaphylaxis and Rash  . Penicillins Anaphylaxis and Rash  . Sulfa Antibiotics Anaphylaxis and Rash  . Darvocet [Propoxyphene N-Acetaminophen]     hallucinations    Social History:   reports that she has never smoked. She has never used smokeless tobacco. She reports that she does not drink alcohol or use illicit drugs.  Family History: Family History  Problem Relation Age of Onset  . Diabetes Father   . Hypertension Father      Physical Exam: Filed Vitals:   01/15/15 2214 01/15/15 2218 01/15/15 2306 01/15/15 2327  BP:   112/49 122/53  Pulse:   89 92  Temp:    98.5 F (36.9 C)  TempSrc:    Oral  Resp:    16  Height:      Weight:      SpO2: 88% 88% 91% 94%   Blood pressure 122/53, pulse 92, temperature 98.5 F (36.9 C), temperature source Oral, resp. rate 16, height  (1.727 m), weight 72.576 kg  (160 lb), SpO2 94 %.  GEN:  Pleasant patient lying in the stretcher in no acute distress; cooperative with exam. PSYCH:  alert and oriented x4; does not appear anxious or depressed; affect is appropriate. HEENT: Mucous membranes pink and anicteric; PERRLA; EOM intact; no cervical lymphadenopathy nor thyromegaly or carotid bruit; no JVD; There were no stridor. Neck is very supple. Breasts:: Not examined CHEST WALL: No tenderness CHEST: Normal respiration, there is some wheezing, and rhonchi everywhere. No rales.  HEART: Regular rate and rhythm.  There are no murmur, rub, or gallops.   BACK: No kyphosis or scoliosis; no CVA tenderness ABDOMEN: soft and non-tender; no masses, no organomegaly, normal abdominal bowel sounds; no pannus; no intertriginous candida. There is no rebound and no distention. Rectal Exam: Not done EXTREMITIES: No bone or joint deformity; age-appropriate arthropathy of the hands and knees; no edema; no ulcerations.  There is no calf tenderness. Genitalia: not examined PULSES: 2+ and symmetric SKIN: Normal hydration no rash or ulceration CNS: Cranial nerves 2-12 grossly intact no focal lateralizing neurologic deficit.  Speech is fluent; uvula elevated with phonation, facial symmetry and tongue midline. DTR are normal bilaterally, cerebella exam is intact, barbinski is negative and strengths are equaled bilaterally.  No sensory loss.   Labs on Admission:  Basic Metabolic Panel:  Recent Labs Lab 01/15/15 2125  NA 135  K 3.5  CL 94*  CO2 32  GLUCOSE 126*  BUN 38*  CREATININE 1.26*  CALCIUM 8.7*   Liver Function Tests:  Recent Labs Lab 01/15/15 2125  AST 20  ALT 20  ALKPHOS 64  BILITOT 1.5*  PROT 7.2  ALBUMIN 3.2*  CBC:  Recent Labs Lab 01/15/15 2125  WBC 19.9*  NEUTROABS 15.8*  HGB 13.9  HCT 42.5  MCV 98.2  PLT 220   Radiological Exams on Admission: Dg Chest 2 View  01/15/2015   CLINICAL DATA:  Cough.  Productive cough with brown sputum.  EXAM:  CHEST  2 VIEW  COMPARISON:  Chest radiograph 04/25/2013.  Chest CTA 04/25/2013.  FINDINGS: Progressive consolidation in the lingula and RIGHT middle lobe evident on the lateral view. Diffuse interstitial prominence is present with a reticulonodular pattern most pronounced at the bases. The cardiopericardial silhouette is within normal limits. Mediastinal contours are normal. No pleural effusion.  IMPRESSION: Progressive RIGHT middle lobe and lingular consolidation. In this age group, this most likely represents atypical infection/mycobacterium avium. Superimposed bacterial pneumonia is in the differential considerations.   Electronically Signed   By: Andreas Newport M.D.   On: 01/15/2015 22:19    EKG: Independently reviewed.   Assessment/Plan Present on Admission:  . Abnormal chest x-ray . COPD exacerbation . Hypertension . Hypoxia . Pneumonia  PLAN:  Will admit her for PNA.  Given her history, I am concerned about MAI, and also about pseudomonas infection with her known bronchiectasis.  Given her PCN allergy, but able to take Keflex, will give her Fortaz IV.  Will continue with her IV Zithromax for atypical infection as well.  I suspect she will need prophylaxis chronic antibiotic with an antipseudomonal agent.  Will defer to Dr Juanetta Gosling, her pulmonologist.  I have held off on IV steroid, as she have already been immunocompromised.   I have considered a CT of the chest, but will defer to Dr Juanetta Gosling.  She is stable, full code, and will be admitted to his service as per prior arrangement.  Thank you, Dr Juanetta Gosling, for allowing me to participate in the care of your nice patient.   Other plans as per orders.  Code Status: FULL Unk Lightning, MD. [facility] Pager [phone] 7pm to 7am.  01/15/2015, 11:40 PM

## 2015-01-16 ENCOUNTER — Encounter (HOSPITAL_COMMUNITY): Payer: Self-pay | Admitting: *Deleted

## 2015-01-16 MED ORDER — LATANOPROST 0.005 % OP SOLN
OPHTHALMIC | Status: AC
  Filled 2015-01-16: qty 2.5

## 2015-01-16 MED ORDER — ATENOLOL 25 MG PO TABS
50.0000 mg | ORAL_TABLET | Freq: Every day | ORAL | Status: DC
Start: 2015-01-16 — End: 2015-01-19
  Administered 2015-01-16 – 2015-01-19 (×4): 50 mg via ORAL
  Filled 2015-01-16 (×4): qty 2

## 2015-01-16 MED ORDER — TIMOLOL MALEATE 0.25 % OP SOLN
1.0000 [drp] | Freq: Two times a day (BID) | OPHTHALMIC | Status: DC
  Administered 2015-01-16 – 2015-01-19 (×7): 1 [drp] via OPHTHALMIC
  Filled 2015-01-16: qty 5

## 2015-01-16 MED ORDER — LORAZEPAM 0.5 MG PO TABS
0.5000 mg | ORAL_TABLET | ORAL | Status: DC | PRN
  Administered 2015-01-16 – 2015-01-17 (×2): 0.5 mg via ORAL
  Filled 2015-01-16 (×2): qty 1

## 2015-01-16 MED ORDER — DEXTROSE 5 % IV SOLN
INTRAVENOUS | Status: AC
  Filled 2015-01-16: qty 1

## 2015-01-16 MED ORDER — LATANOPROST 0.005 % OP SOLN
1.0000 [drp] | Freq: Every day | OPHTHALMIC | Status: DC
  Administered 2015-01-16 – 2015-01-18 (×4): 1 [drp] via OPHTHALMIC
  Filled 2015-01-16: qty 2.5

## 2015-01-16 MED ORDER — OCUVITE PO TABS
1.0000 | ORAL_TABLET | Freq: Every day | ORAL | Status: DC
  Administered 2015-01-16 – 2015-01-19 (×4): 1 via ORAL
  Filled 2015-01-16 (×4): qty 1

## 2015-01-16 MED ORDER — DORZOLAMIDE HCL 2 % OP SOLN
1.0000 [drp] | Freq: Two times a day (BID) | OPHTHALMIC | Status: DC
  Administered 2015-01-16 – 2015-01-19 (×7): 1 [drp] via OPHTHALMIC
  Filled 2015-01-16: qty 10

## 2015-01-16 MED ORDER — HEPARIN SODIUM (PORCINE) 5000 UNIT/ML IJ SOLN
5000.0000 [IU] | Freq: Three times a day (TID) | INTRAMUSCULAR | Status: DC
  Administered 2015-01-16 – 2015-01-18 (×8): 5000 [IU] via SUBCUTANEOUS
  Filled 2015-01-16 (×7): qty 1

## 2015-01-16 MED ORDER — DEXTROSE 5 % IV SOLN
1.0000 g | INTRAVENOUS | Status: DC
  Administered 2015-01-17 – 2015-01-18 (×2): 1 g via INTRAVENOUS
  Filled 2015-01-16 (×6): qty 1

## 2015-01-16 MED ORDER — ALBUTEROL SULFATE (2.5 MG/3ML) 0.083% IN NEBU: 2.5000 mg | INHALATION_SOLUTION | RESPIRATORY_TRACT | Status: DC | PRN

## 2015-01-16 MED ORDER — ALBUTEROL SULFATE (2.5 MG/3ML) 0.083% IN NEBU
2.5000 mg | INHALATION_SOLUTION | Freq: Four times a day (QID) | RESPIRATORY_TRACT | Status: DC
  Administered 2015-01-16 – 2015-01-18 (×8): 2.5 mg via RESPIRATORY_TRACT
  Filled 2015-01-16 (×9): qty 3

## 2015-01-16 MED ORDER — DEXTROSE 5 % IV SOLN
2.0000 g | Freq: Once | INTRAVENOUS | Status: AC
  Administered 2015-01-16: 2 g via INTRAVENOUS
  Filled 2015-01-16: qty 2

## 2015-01-16 MED ORDER — ASPIRIN EC 81 MG PO TBEC
81.0000 mg | DELAYED_RELEASE_TABLET | Freq: Every day | ORAL | Status: DC
  Administered 2015-01-16 – 2015-01-19 (×4): 81 mg via ORAL
  Filled 2015-01-16 (×4): qty 1

## 2015-01-16 MED ORDER — CLARITHROMYCIN 500 MG PO TABS
500.0000 mg | ORAL_TABLET | Freq: Two times a day (BID) | ORAL | Status: DC
  Administered 2015-01-16 – 2015-01-19 (×7): 500 mg via ORAL
  Filled 2015-01-16 (×7): qty 1

## 2015-01-16 MED ORDER — CEFTAZIDIME 1 G IJ SOLR
1.0000 g | Freq: Two times a day (BID) | INTRAMUSCULAR | Status: DC
  Administered 2015-01-16: 1 g via INTRAVENOUS
  Filled 2015-01-16 (×2): qty 1

## 2015-01-16 MED ORDER — VANCOMYCIN HCL IN DEXTROSE 750-5 MG/150ML-% IV SOLN
750.0000 mg | INTRAVENOUS | Status: DC
  Administered 2015-01-17 – 2015-01-18 (×2): 750 mg via INTRAVENOUS
  Filled 2015-01-16 (×7): qty 150

## 2015-01-16 MED ORDER — PAROXETINE HCL 20 MG PO TABS
20.0000 mg | ORAL_TABLET | Freq: Every day | ORAL | Status: DC
  Administered 2015-01-16 – 2015-01-19 (×4): 20 mg via ORAL
  Filled 2015-01-16 (×4): qty 1

## 2015-01-16 MED ORDER — CETYLPYRIDINIUM CHLORIDE 0.05 % MT LIQD
7.0000 mL | Freq: Two times a day (BID) | OROMUCOSAL | Status: DC
  Administered 2015-01-16 – 2015-01-19 (×7): 7 mL via OROMUCOSAL

## 2015-01-16 MED ORDER — DEXTROSE 5 % IV SOLN: 500.0000 mg | INTRAVENOUS | Status: DC

## 2015-01-16 MED ORDER — BRIMONIDINE TARTRATE 0.2 % OP SOLN
1.0000 [drp] | Freq: Two times a day (BID) | OPHTHALMIC | Status: DC
  Administered 2015-01-16 – 2015-01-19 (×7): 1 [drp] via OPHTHALMIC
  Filled 2015-01-16: qty 5

## 2015-01-16 MED ORDER — VANCOMYCIN HCL 10 G IV SOLR
1500.0000 mg | Freq: Once | INTRAVENOUS | Status: AC
  Administered 2015-01-16: 1500 mg via INTRAVENOUS
  Filled 2015-01-16: qty 1500

## 2015-01-16 MED ORDER — BRIMONIDINE TARTRATE-TIMOLOL 0.2-0.5 % OP SOLN: 1.0000 [drp] | Freq: Two times a day (BID) | OPHTHALMIC | Status: DC

## 2015-01-16 NOTE — Progress Notes (Signed)
ANTIBIOTIC CONSULT NOTE - INITIAL  Pharmacy Consult for Vancomycin and Cefepime Indication: pneumonia  Allergies  Allergen Reactions  . Amoxicillin Anaphylaxis and Rash  . Penicillins Anaphylaxis and Rash  . Sulfa Antibiotics Anaphylaxis and Rash  . Darvocet [Propoxyphene N-Acetaminophen]     hallucinations    Patient Measurements: Height: 5\' 8"  (172.7 cm) Weight: 149 lb 3.2 oz (67.677 kg) IBW/kg (Calculated) : 63.9  Vital Signs: Temp: 98 F (36.7 C) (05/03 0547) Temp Source: Oral (05/03 0547) BP: 105/50 mmHg (05/03 0547) Pulse Rate: 67 (05/03 0547) Intake/Output from previous day: 05/02 0701 - 05/03 0700 In: 50 [IV Piggyback:50] Out: -  Intake/Output from this shift: Total I/O In: 240 [P.O.:240] Out: -   Labs:  Recent Labs  01/15/15 2125  WBC 19.9*  HGB 13.9  PLT 220  CREATININE 1.26*   Estimated Creatinine Clearance: 33.5 mL/min (by C-G formula based on Cr of 1.26). No results for input(s): VANCOTROUGH, VANCOPEAK, VANCORANDOM, GENTTROUGH, GENTPEAK, GENTRANDOM, TOBRATROUGH, TOBRAPEAK, TOBRARND, AMIKACINPEAK, AMIKACINTROU, AMIKACIN in the last 72 hours.   Microbiology: Recent Results (from the past 720 hour(s))  Blood culture (routine x 2)     Status: None (Preliminary result)   Collection Time: 01/15/15 10:00 PM  Result Value Ref Range Status   Specimen Description BLOOD LEFT ARM  Final   Special Requests BOTTLES DRAWN AEROBIC AND ANAEROBIC 6CC  Final   Culture NO GROWTH 1 DAY  Final   Report Status PENDING  Incomplete  Blood culture (routine x 2)     Status: None (Preliminary result)   Collection Time: 01/15/15 10:40 PM  Result Value Ref Range Status   Specimen Description RIGHT ANTECUBITAL  Final   Special Requests BOTTLES DRAWN AEROBIC AND ANAEROBIC 6CC  Final   Culture NO GROWTH 1 DAY  Final   Report Status PENDING  Incomplete    Medical History: Past Medical History  Diagnosis Date  . Hypertension   . COPD (chronic obstructive pulmonary  disease)   . Anxiety   . Shortness of breath   . Pneumonia 04/2013  . Headache(784.0)   . Arthritis   . MAI (mycobacterium avium-intracellulare) infection 2014  . Bronchiectasis 2014  . IBS (irritable bowel syndrome)   . Gout   . DVT (deep venous thrombosis) 2008    LLE   Anti-infectives    Start     Dose/Rate Route Frequency Ordered Stop   01/17/15 1200  ceFEPIme (MAXIPIME) 1 g in dextrose 5 % 50 mL IVPB     1 g 100 mL/hr over 30 Minutes Intravenous Every 24 hours 01/16/15 1253     01/17/15 1000  vancomycin (VANCOCIN) IVPB 750 mg/150 ml premix     750 mg 150 mL/hr over 60 Minutes Intravenous Every 24 hours 01/16/15 1252     01/16/15 2200  azithromycin (ZITHROMAX) 500 mg in dextrose 5 % 250 mL IVPB  Status:  Discontinued     500 mg 250 mL/hr over 60 Minutes Intravenous Every 24 hours 01/16/15 0055 01/16/15 0913   01/16/15 1100  vancomycin (VANCOCIN) 1,500 mg in sodium chloride 0.9 % 500 mL IVPB     1,500 mg 250 mL/hr over 120 Minutes Intravenous  Once 01/16/15 0935 01/16/15 1224   01/16/15 1030  ceFEPIme (MAXIPIME) 2 g in dextrose 5 % 50 mL IVPB     2 g 100 mL/hr over 30 Minutes Intravenous  Once 01/16/15 0938 01/16/15 1054   01/16/15 1000  clarithromycin (BIAXIN) tablet 500 mg     500 mg  Oral Every 12 hours 01/16/15 0913     01/16/15 0055  cefTAZidime (FORTAZ) 1 g in dextrose 5 % 50 mL IVPB  Status:  Discontinued     1 g 100 mL/hr over 30 Minutes Intravenous Every 12 hours 01/16/15 0055 01/16/15 0913   01/15/15 2245  levofloxacin (LEVAQUIN) IVPB 500 mg  Status:  Discontinued     500 mg 100 mL/hr over 60 Minutes Intravenous  Once 01/15/15 2231 01/15/15 2234   01/15/15 2245  azithromycin (ZITHROMAX) 500 mg in dextrose 5 % 250 mL IVPB  Status:  Discontinued     500 mg 250 mL/hr over 60 Minutes Intravenous Every 24 hours 01/15/15 2234 01/16/15 0913     Assessment: 81yo female admitted with SOB and pneumonia.  Asked to initiate Vancomycin and Cefepime.  SCr is elevated.     Goal of Therapy:  Vancomycin trough level 15-20 mcg/ml  Plan:  Vancomycin  IV today x 1 then Vancomycin  IV q24hrs Check trough at steady state Cefepime 2gm IV today x 1 then 1gm IV q24hrs Monitor labs, renal fxn, cultures and progress  Valrie Hart A 01/16/2015,12:55 PM

## 2015-01-16 NOTE — Evaluation (Addendum)
Physical Therapy Evaluation Patient Details Name: Stacie Wright MRN: [id number] DOB: 10/13/1927 Today's Date: 01/16/2015   History of Present Illness  Stacie Wright is an 81 y.o. female with hx of recurrent PNA, with COPD and bronchiectasis, hx of MAI infection last year, anxiety, nonsmoker, presented to the ER as she has progressively feeling more SOB, persistent coughing, and feeling weak. She had coughs for many months, almost a year according to her husband. She recently was placed on a course of steroid, along with antibiotics, but her symptoms persisted. She denied fever or chills. In the ER, she was noted to be hypoxic, with Sat in the high 80's, and she was given supplemental oxygen. She has supposingly anaphylaxis with PCN, but she has been able to take Keflex previously. She had some blood tingued sputum tonight as well. Evaluation in the ER with CXR showing atypical infiltrate, unclear if bacterial PNA, or atypical infection like MAI. Her WBC is elevated to 19K, and her Hb was normal. She has a Cr of 1.26. Hopsitalist was asked to admit her for pneumonia.   Clinical Impression  Pt states that she normally uses no assistive device to ambulate.  Pt states that for the past week she has been dizzy when she gets up.  Therapist presumes that this is secondary to pt reducing her activity level.  Pt complained of dizziness upon standing; sat back down for a minute and attempted to stand again this time dizziness resolved in 30 seconds.  Pt ambulated x 200 feet and complained of dizziness once again that resolved with sitting.  Pt has decreased activity tolerance.     Follow Up Recommendations Outpatient PT    Equipment Recommendations  None recommended by PT    Recommendations for Other Services       Precautions / Restrictions Precautions Precautions: None Restrictions Weight Bearing Restrictions: No      Mobility  Bed Mobility Overal bed mobility: Independent                 Transfers Overall transfer level: Independent                  Ambulation/Gait Ambulation/Gait assistance: Supervision Ambulation Distance (Feet): 200 Feet Assistive device: None Gait Pattern/deviations: WFL(Within Functional Limits)   Gait velocity interpretation: at or above normal speed for age/gender    Stairs            Wheelchair Mobility    Modified Rankin (Stroke Patients Only)             Pertinent Vitals/Pain Pain Assessment: No/denies pain    Home Living Family/patient expects to be discharged to:: Private residence Living Arrangements: Spouse/significant other Available Help at Discharge: Family;Friend(s);Available 24 hours/day Type of Home: House Home Access: Stairs to enter     Home Layout: Laundry or work area in basement Home Equipment: Environmental consultant - 2 wheels      Prior Function Level of Independence: Independent                  Extremity/Trunk Assessment               Lower Extremity Assessment: Overall WFL for tasks assessed         Communication   Communication: No difficulties  Cognition Arousal/Alertness: Awake/alert   Overall Cognitive Status: Within Functional Limits for tasks assessed                      General Comments  Exercises        Assessment/Plan    PT Assessment Patient needs continued PT services  PT Diagnosis Generalized weakness   PT Problem List Decreased activity tolerance  PT Treatment Interventions     PT Goals (Current goals can be found in the Care Plan section) Acute Rehab PT Goals PT Goal Formulation: With patient Potential to Achieve Goals: Good    Frequency Min 3X/week           End of Session Equipment Utilized During Treatment: Gait belt Activity Tolerance: Patient limited by fatigue Patient left: in chair;with call bell/phone within reach;with chair alarm set;with family/visitor present           Time: [id number] PT Time  Calculation (min) (ACUTE ONLY): 50 min   Charges:   PT Evaluation $Initial PT Evaluation Tier I: 1 Procedure     PT G CodesVirgina Organ:       Cynthia Russell, PT CLT [phone] 01/16/2015, 11:07 AM

## 2015-01-16 NOTE — Progress Notes (Signed)
UR chart review completed.  

## 2015-01-16 NOTE — Care Management Note (Signed)
Case Management Note  Patient Details  Name: Stacie Wright MRN: [id number] Date of Birth: Oct 16, 1927  Subjective/Objective:                  Pt admitted from home with pneumonia. Pt lives with her husband and will return home at discharge. Pt is fairly independent with ADl's.   Action/Plan: PT recommends outpt PT services and pt/husband agreeable. CM will arrange prior to discharge.  Expected Discharge Date:  01/19/15               Expected Discharge Plan:  Home/Self Care  In-House Referral:  NA  Discharge planning Services  CM Consult  Post Acute Care Choice:  NA Choice offered to:  Patient  DME Arranged:    DME Agency:     HH Arranged:    HH Agency:     Status of Service:  In process, will continue to follow  Medicare Important Message Given:    Date Medicare IM Given:    Medicare IM give by:    Date Additional Medicare IM Given:    Additional Medicare Important Message give by:     If discussed at Long Length of Stay Meetings, dates discussed:    Additional Comments:  Cheryl FlashBlackwell, Bart Ashford Crowder, RN 01/16/2015, 11:46 AM

## 2015-01-16 NOTE — Progress Notes (Signed)
Subjective: She was admitted with acute shortness of breath. She improved with bronchodilator in the emergency department and she does not have a nebulizer at home so that may be helpful for her. She has been having increasing weakness over the last several months. She's been coughing up some blood.  Objective: Vital signs in last 24 hours: Temp:  [98 F (36.7 C)-99.4 F (37.4 C)] 98 F (36.7 C) (05/03 0547) Pulse Rate:  [67-94] 67 (05/03 0547) Resp:  [16] 16 (05/03 0547) BP: (105-138)/(49-84) 105/50 mmHg (05/03 0547) SpO2:  [88 %-94 %] 92 % (05/03 0801) Weight:  [67.677 kg (149 lb 3.2 oz)-72.576 kg (160 lb)] 67.677 kg (149 lb 3.2 oz) (05/03 0020) Weight change:  Last BM Date: 01/14/15  Intake/Output from previous day: 05/02 0701 - 05/03 0700 In: 77 [IV Piggyback:50] Out: -   PHYSICAL EXAM General appearance: alert, cooperative and mild distress Resp: rales bilaterally Cardio: regular rate and rhythm, S1, S2 normal, no murmur, click, rub or gallop GI: soft, non-tender; bowel sounds normal; no masses,  no organomegaly Extremities: extremities normal, atraumatic, no cyanosis or edema  Lab Results:  Results for orders placed or performed during the hospital encounter of 01/15/15 (from the past 48 hour(s))  CBC with Differential/Platelet     Status: Abnormal   Collection Time: 01/15/15  9:25 PM  Result Value Ref Range   WBC 19.9 (H) 4.0 - 10.5 K/uL   RBC 4.33 3.87 - 5.11 MIL/uL   Hemoglobin 13.9 12.0 - 15.0 g/dL   HCT 42.5 36.0 - 46.0 %   MCV 98.2 78.0 - 100.0 fL   MCH 32.1 26.0 - 34.0 pg   MCHC 32.7 30.0 - 36.0 g/dL   RDW 14.6 11.5 - 15.5 %   Platelets 220 150 - 400 K/uL   Neutrophils Relative % 80 (H) 43 - 77 %   Neutro Abs 15.8 (H) 1.7 - 7.7 K/uL   Lymphocytes Relative 9 (L) 12 - 46 %   Lymphs Abs 1.8 0.7 - 4.0 K/uL   Monocytes Relative 10 3 - 12 %   Monocytes Absolute 2.0 (H) 0.1 - 1.0 K/uL   Eosinophils Relative 1 0 - 5 %   Eosinophils Absolute 0.2 0.0 - 0.7 K/uL    Basophils Relative 0 0 - 1 %   Basophils Absolute 0.0 0.0 - 0.1 K/uL  Comprehensive metabolic panel     Status: Abnormal   Collection Time: 01/15/15  9:25 PM  Result Value Ref Range   Sodium 135 135 - 145 mmol/L   Potassium 3.5 3.5 - 5.1 mmol/L   Chloride 94 (L) 101 - 111 mmol/L   CO2 32 22 - 32 mmol/L   Glucose, Bld 126 (H) 70 - 99 mg/dL   BUN 38 (H) 6 - 20 mg/dL   Creatinine, Ser 1.26 (H) 0.44 - 1.00 mg/dL   Calcium 8.7 (L) 8.9 - 10.3 mg/dL   Total Protein 7.2 6.5 - 8.1 g/dL   Albumin 3.2 (L) 3.5 - 5.0 g/dL   AST 20 15 - 41 U/L   ALT 20 14 - 54 U/L   Alkaline Phosphatase 64 38 - 126 U/L   Total Bilirubin 1.5 (H) 0.3 - 1.2 mg/dL   GFR calc non Af Amer 38 (L) >60 mL/min   GFR calc Af Amer 44 (L) >60 mL/min    Comment: (NOTE) The eGFR has been calculated using the CKD EPI equation. This calculation has not been validated in all clinical situations. eGFR's persistently <90 mL/min  signify possible Chronic Kidney Disease.    Anion gap 9 5 - 15  Blood culture (routine x 2)     Status: None (Preliminary result)   Collection Time: 01/15/15 10:00 PM  Result Value Ref Range   Specimen Description BLOOD LEFT ARM    Special Requests BOTTLES DRAWN AEROBIC AND ANAEROBIC 6CC    Culture PENDING    Report Status PENDING   Blood culture (routine x 2)     Status: None (Preliminary result)   Collection Time: 01/15/15 10:40 PM  Result Value Ref Range   Specimen Description RIGHT ANTECUBITAL    Special Requests BOTTLES DRAWN AEROBIC AND ANAEROBIC 6CC    Culture PENDING    Report Status PENDING     ABGS No results for input(s): PHART, PO2ART, TCO2, HCO3 in the last 72 hours.  Invalid input(s): PCO2 CULTURES Recent Results (from the past 240 hour(s))  Blood culture (routine x 2)     Status: None (Preliminary result)   Collection Time: 01/15/15 10:00 PM  Result Value Ref Range Status   Specimen Description BLOOD LEFT ARM  Final   Special Requests BOTTLES DRAWN AEROBIC AND ANAEROBIC  6CC  Final   Culture PENDING  Incomplete   Report Status PENDING  Incomplete  Blood culture (routine x 2)     Status: None (Preliminary result)   Collection Time: 01/15/15 10:40 PM  Result Value Ref Range Status   Specimen Description RIGHT ANTECUBITAL  Final   Special Requests BOTTLES DRAWN AEROBIC AND ANAEROBIC [facility]  Final   Culture PENDING  Incomplete   Report Status PENDING  Incomplete   Studies/Results: Dg Chest 2 View  01/15/2015   CLINICAL DATA:  Cough.  Productive cough with brown sputum.  EXAM: CHEST  2 VIEW  COMPARISON:  Chest radiograph 04/25/2013.  Chest CTA 04/25/2013.  FINDINGS: Progressive consolidation in the lingula and RIGHT middle lobe evident on the lateral view. Diffuse interstitial prominence is present with a reticulonodular pattern most pronounced at the bases. The cardiopericardial silhouette is within normal limits. Mediastinal contours are normal. No pleural effusion.  IMPRESSION: Progressive RIGHT middle lobe and lingular consolidation. In this age group, this most likely represents atypical infection/mycobacterium avium. Superimposed bacterial pneumonia is in the differential considerations.   Electronically Signed   By: Dereck Ligas M.D.   On: 01/15/2015 22:19    Medications:  Prior to Admission:  Prescriptions prior to admission  Medication Sig Dispense Refill Last Dose  . allopurinol (ZYLOPRIM) 300 MG tablet Take 300 mg by mouth daily.   01/15/2015 at Unknown time  . aspirin EC 81 MG tablet Take 81 mg by mouth daily.   01/15/2015 at Unknown time  . atenolol (TENORMIN) 50 MG tablet Take 50 mg by mouth daily.   01/15/2015 at Unknown time  . beta carotene w/minerals (OCUVITE) tablet Take 1 tablet by mouth daily.   01/15/2015 at Unknown time  . brimonidine-timolol (COMBIGAN) 0.2-0.5 % ophthalmic solution Place 1 drop into both eyes every 12 (twelve) hours.   01/15/2015 at Unknown time  . dorzolamide (TRUSOPT) 2 % ophthalmic solution Place 1 drop into both eyes 2 (two)  times daily.   01/15/2015 at Unknown time  . hyoscyamine (LEVBID) 0.375 MG 12 hr tablet Take 0.1875 mg by mouth every morning.    01/15/2015 at Unknown time  . latanoprost (XALATAN) 0.005 % ophthalmic solution Place 1 drop into both eyes at bedtime.   01/14/2015 at Unknown time  . PARoxetine (PAXIL) 20 MG tablet Take 20  mg by mouth every morning.   01/15/2015 at Unknown time  . triamterene-hydrochlorothiazide (MAXZIDE-25) 37.5-25 MG per tablet Take 1 tablet by mouth every other day.   01/14/2015 at Unknown time  . doxycycline (VIBRA-TABS) 100 MG tablet Take 1 tablet (100 mg total) by mouth 2 (two) times daily. (Patient not taking: Reported on 01/15/2015) 20 tablet 1 Taking  . levofloxacin (LEVAQUIN) 500 MG tablet Take 500 mg by mouth daily. [place] ON 01/08/2015   Completed Course at Unknown time  . methylPREDNISolone (MEDROL DOSEPAK) 4 MG TBPK tablet Take 4-24 mg by mouth See admin instructions.   Completed Course at Unknown time  . predniSONE (STERAPRED UNI-PAK) 10 MG tablet Take 1 tablet (10 mg total) by mouth daily. Take as directed (Patient not taking: Reported on 01/15/2015) 21 tablet 0 Taking   Scheduled: . albuterol  2.5 mg Nebulization Q6H  . antiseptic oral rinse  7 mL Mouth Rinse BID  . aspirin EC  81 mg Oral Daily  . atenolol  50 mg Oral Daily  . azithromycin  500 mg Intravenous Q24H  . azithromycin  500 mg Intravenous Q24H  . beta carotene w/minerals  1 tablet Oral Daily  . brimonidine  1 drop Both Eyes BID  . cefTAZidime (FORTAZ)  IV  1 g Intravenous Q12H  . dorzolamide  1 drop Both Eyes BID  . heparin  5,000 Units Subcutaneous 3 times per day  . latanoprost  1 drop Both Eyes QHS  . PARoxetine  20 mg Oral Daily  . timolol  1 drop Both Eyes BID   Continuous:  IHW:[id number]  Assesment: She has Mycobacterium avium disease on top of baseline bronchiectasis. I think she has pneumonia now.  She has malnutrition related to the above.  She has hypertension which is well  controlled Principal Problem:   Bronchiectasis without acute exacerbation Active Problems:   Abnormal chest x-ray   Hypertension   Hypoxia   COPD exacerbation   Pneumonia    Plan: Continue antibiotics. I will request PT consultation.    LOS: 1 day   Adely Facer L 01/16/2015, 9:06 AM

## 2015-01-17 LAB — BASIC METABOLIC PANEL
Anion gap: 8 (ref 5–15)
BUN: 22 mg/dL — AB (ref 6–20)
CALCIUM: 8.3 mg/dL — AB (ref 8.9–10.3)
CHLORIDE: 97 mmol/L — AB (ref 101–111)
CO2: 32 mmol/L (ref 22–32)
Creatinine, Ser: 0.99 mg/dL (ref 0.44–1.00)
GFR calc Af Amer: 59 mL/min — ABNORMAL LOW (ref >60)
GFR calc non Af Amer: 51 mL/min — ABNORMAL LOW (ref >60)
Glucose, Bld: 100 mg/dL — ABNORMAL HIGH (ref 70–99)
Potassium: 3.4 mmol/L — ABNORMAL LOW (ref 3.5–5.1)
Sodium: 137 mmol/L (ref 135–145)

## 2015-01-17 MED ORDER — POTASSIUM CHLORIDE CRYS ER 20 MEQ PO TBCR
40.0000 meq | EXTENDED_RELEASE_TABLET | Freq: Once | ORAL | Status: AC
  Administered 2015-01-17: 40 meq via ORAL
  Filled 2015-01-17: qty 2

## 2015-01-17 MED ORDER — POLYETHYLENE GLYCOL 3350 17 G PO PACK
17.0000 g | PACK | Freq: Every day | ORAL | Status: DC
  Administered 2015-01-17 – 2015-01-18 (×2): 17 g via ORAL
  Filled 2015-01-17 (×3): qty 1

## 2015-01-17 MED ORDER — ENSURE PUDDING PO PUDG
1.0000 | Freq: Three times a day (TID) | ORAL | Status: DC
  Administered 2015-01-17 – 2015-01-19 (×7): 1 via ORAL

## 2015-01-17 NOTE — Progress Notes (Signed)
Subjective: She says she feels okay. She became agitated yesterday and was treated with Ativan which helped. She has no other new complaints. She is still coughing.  Objective: Vital signs in last 24 hours: Temp:  [97.5 F (36.4 C)-99.3 F (37.4 C)] 99.1 F (37.3 C) (05/04 0521) Pulse Rate:  [69-77] 75 (05/04 0521) Resp:  [16] 16 (05/04 0521) BP: (113-152)/(49-66) 113/50 mmHg (05/04 0521) SpO2:  [94 %-97 %] 96 % (05/04 0715) Weight change:  Last BM Date: 01/14/15  Intake/Output from previous day: 05/03 0701 - 05/04 0700 In: 480 [P.O.:480] Out: -   PHYSICAL EXAM General appearance: alert, mild distress and Less anxious than yesterday but still somewhat confused Resp: rales bilaterally and rhonchi bilaterally Cardio: regular rate and rhythm, S1, S2 normal, no murmur, click, rub or gallop GI: soft, non-tender; bowel sounds normal; no masses,  no organomegaly Extremities: extremities normal, atraumatic, no cyanosis or edema  Lab Results:  Results for orders placed or performed during the hospital encounter of 01/15/15 (from the past 48 hour(s))  CBC with Differential/Platelet     Status: Abnormal   Collection Time: 01/15/15  9:25 PM  Result Value Ref Range   WBC 19.9 (H) 4.0 - 10.5 K/uL   RBC 4.33 3.87 - 5.11 MIL/uL   Hemoglobin 13.9 12.0 - 15.0 g/dL   HCT 42.5 36.0 - 46.0 %   MCV 98.2 78.0 - 100.0 fL   MCH 32.1 26.0 - 34.0 pg   MCHC 32.7 30.0 - 36.0 g/dL   RDW 14.6 11.5 - 15.5 %   Platelets 220 150 - 400 K/uL   Neutrophils Relative % 80 (H) 43 - 77 %   Neutro Abs 15.8 (H) 1.7 - 7.7 K/uL   Lymphocytes Relative 9 (L) 12 - 46 %   Lymphs Abs 1.8 0.7 - 4.0 K/uL   Monocytes Relative 10 3 - 12 %   Monocytes Absolute 2.0 (H) 0.1 - 1.0 K/uL   Eosinophils Relative 1 0 - 5 %   Eosinophils Absolute 0.2 0.0 - 0.7 K/uL   Basophils Relative 0 0 - 1 %   Basophils Absolute 0.0 0.0 - 0.1 K/uL  Comprehensive metabolic panel     Status: Abnormal   Collection Time: 01/15/15  9:25 PM   Result Value Ref Range   Sodium 135 135 - 145 mmol/L   Potassium 3.5 3.5 - 5.1 mmol/L   Chloride 94 (L) 101 - 111 mmol/L   CO2 32 22 - 32 mmol/L   Glucose, Bld 126 (H) 70 - 99 mg/dL   BUN 38 (H) 6 - 20 mg/dL   Creatinine, Ser 1.26 (H) 0.44 - 1.00 mg/dL   Calcium 8.7 (L) 8.9 - 10.3 mg/dL   Total Protein 7.2 6.5 - 8.1 g/dL   Albumin 3.2 (L) 3.5 - 5.0 g/dL   AST 20 15 - 41 U/L   ALT 20 14 - 54 U/L   Alkaline Phosphatase 64 38 - 126 U/L   Total Bilirubin 1.5 (H) 0.3 - 1.2 mg/dL   GFR calc non Af Amer 38 (L) >60 mL/min   GFR calc Af Amer 44 (L) >60 mL/min    Comment: (NOTE) The eGFR has been calculated using the CKD EPI equation. This calculation has not been validated in all clinical situations. eGFR's persistently <90 mL/min signify possible Chronic Kidney Disease.    Anion gap 9 5 - 15  Blood culture (routine x 2)     Status: None (Preliminary result)   Collection Time: 01/15/15  10:00 PM  Result Value Ref Range   Specimen Description BLOOD LEFT ARM    Special Requests BOTTLES DRAWN AEROBIC AND ANAEROBIC 6CC    Culture NO GROWTH 1 DAY    Report Status PENDING   Blood culture (routine x 2)     Status: None (Preliminary result)   Collection Time: 01/15/15 10:40 PM  Result Value Ref Range   Specimen Description RIGHT ANTECUBITAL    Special Requests BOTTLES DRAWN AEROBIC AND ANAEROBIC 6CC    Culture NO GROWTH 1 DAY    Report Status PENDING   Basic metabolic panel     Status: Abnormal   Collection Time: 01/17/15  5:31 AM  Result Value Ref Range   Sodium 137 135 - 145 mmol/L   Potassium 3.4 (L) 3.5 - 5.1 mmol/L   Chloride 97 (L) 101 - 111 mmol/L   CO2 32 22 - 32 mmol/L   Glucose, Bld 100 (H) 70 - 99 mg/dL   BUN 22 (H) 6 - 20 mg/dL   Creatinine, Ser 0.99 0.44 - 1.00 mg/dL   Calcium 8.3 (L) 8.9 - 10.3 mg/dL   GFR calc non Af Amer 51 (L) >60 mL/min   GFR calc Af Amer 59 (L) >60 mL/min    Comment: (NOTE) The eGFR has been calculated using the CKD EPI equation. This  calculation has not been validated in all clinical situations. eGFR's persistently <90 mL/min signify possible Chronic Kidney Disease.    Anion gap 8 5 - 15    ABGS No results for input(s): PHART, PO2ART, TCO2, HCO3 in the last 72 hours.  Invalid input(s): PCO2 CULTURES Recent Results (from the past 240 hour(s))  Blood culture (routine x 2)     Status: None (Preliminary result)   Collection Time: 01/15/15 10:00 PM  Result Value Ref Range Status   Specimen Description BLOOD LEFT ARM  Final   Special Requests BOTTLES DRAWN AEROBIC AND ANAEROBIC 6CC  Final   Culture NO GROWTH 1 DAY  Final   Report Status PENDING  Incomplete  Blood culture (routine x 2)     Status: None (Preliminary result)   Collection Time: 01/15/15 10:40 PM  Result Value Ref Range Status   Specimen Description RIGHT ANTECUBITAL  Final   Special Requests BOTTLES DRAWN AEROBIC AND ANAEROBIC 6CC  Final   Culture NO GROWTH 1 DAY  Final   Report Status PENDING  Incomplete   Studies/Results: Dg Chest 2 View  01/15/2015   CLINICAL DATA:  Cough.  Productive cough with brown sputum.  EXAM: CHEST  2 VIEW  COMPARISON:  Chest radiograph 04/25/2013.  Chest CTA 04/25/2013.  FINDINGS: Progressive consolidation in the lingula and RIGHT middle lobe evident on the lateral view. Diffuse interstitial prominence is present with a reticulonodular pattern most pronounced at the bases. The cardiopericardial silhouette is within normal limits. Mediastinal contours are normal. No pleural effusion.  IMPRESSION: Progressive RIGHT middle lobe and lingular consolidation. In this age group, this most likely represents atypical infection/mycobacterium avium. Superimposed bacterial pneumonia is in the differential considerations.   Electronically Signed   By: Dereck Ligas M.D.   On: 01/15/2015 22:19    Medications:  Prior to Admission:  Prescriptions prior to admission  Medication Sig Dispense Refill Last Dose  . allopurinol (ZYLOPRIM) 300 MG  tablet Take 300 mg by mouth daily.   01/15/2015 at Unknown time  . aspirin EC 81 MG tablet Take 81 mg by mouth daily.   01/15/2015 at Unknown time  . atenolol (  TENORMIN) 50 MG tablet Take 50 mg by mouth daily.   01/15/2015 at Unknown time  . beta carotene w/minerals (OCUVITE) tablet Take 1 tablet by mouth daily.   01/15/2015 at Unknown time  . brimonidine-timolol (COMBIGAN) 0.2-0.5 % ophthalmic solution Place 1 drop into both eyes every 12 (twelve) hours.   01/15/2015 at Unknown time  . dorzolamide (TRUSOPT) 2 % ophthalmic solution Place 1 drop into both eyes 2 (two) times daily.   01/15/2015 at Unknown time  . hyoscyamine (LEVBID) 0.375 MG 12 hr tablet Take 0.1875 mg by mouth every morning.    01/15/2015 at Unknown time  . latanoprost (XALATAN) 0.005 % ophthalmic solution Place 1 drop into both eyes at bedtime.   01/14/2015 at Unknown time  . PARoxetine (PAXIL) 20 MG tablet Take 20 mg by mouth every morning.   01/15/2015 at Unknown time  . triamterene-hydrochlorothiazide (MAXZIDE-25) 37.5-25 MG per tablet Take 1 tablet by mouth every other day.   01/14/2015 at Unknown time  . doxycycline (VIBRA-TABS) 100 MG tablet Take 1 tablet (100 mg total) by mouth 2 (two) times daily. (Patient not taking: Reported on 01/15/2015) 20 tablet 1 Taking  . levofloxacin (LEVAQUIN) 500 MG tablet Take 500 mg by mouth daily. [place] ON 01/08/2015   Completed Course at Unknown time  . methylPREDNISolone (MEDROL DOSEPAK) 4 MG TBPK tablet Take 4-24 mg by mouth See admin instructions.   Completed Course at Unknown time  . predniSONE (STERAPRED UNI-PAK) 10 MG tablet Take 1 tablet (10 mg total) by mouth daily. Take as directed (Patient not taking: Reported on 01/15/2015) 21 tablet 0 Taking   Scheduled: . albuterol  2.5 mg Nebulization Q6H  . antiseptic oral rinse  7 mL Mouth Rinse BID  . aspirin EC  81 mg Oral Daily  . atenolol  50 mg Oral Daily  . beta carotene w/minerals  1 tablet Oral Daily  . brimonidine  1 drop Both Eyes BID   . ceFEPime (MAXIPIME) IV  1 g Intravenous Q24H  . clarithromycin  500 mg Oral Q12H  . dorzolamide  1 drop Both Eyes BID  . feeding supplement (ENSURE)  1 Container Oral TID BM  . heparin  5,000 Units Subcutaneous 3 times per day  . latanoprost  1 drop Both Eyes QHS  . PARoxetine  20 mg Oral Daily  . polyethylene glycol  17 g Oral Daily  . potassium chloride  40 mEq Oral Once  . timolol  1 drop Both Eyes BID  . vancomycin  750 mg Intravenous Q24H   Continuous:  UVO:[id number], LORazepam  Assesment: She was admitted with pneumonia. Because of her severe underlying lung disease she is being treated as healthcare associated pneumonia. She seems to have improved.  At baseline she has bronchiectasis and MAC. She's been being treated for MAC for about 15 months now. She really has not improved  She has dementia and had some agitation yesterday  She is deconditioned and outpatient physical therapy has been suggested  She has not had a bowel movement in about 72 hours  Her potassium level is slightly low Principal Problem:   Bronchiectasis without acute exacerbation Active Problems:   Abnormal chest x-ray   Hypertension   Hypoxia   COPD exacerbation   Pneumonia    Plan: No change in treatments for now. She had mild hypokalemia and her potassium will be replaced. She will be given MiraLAX for constipation    LOS: 2 days   Stacie Wright L 01/17/2015,  8:33 AM

## 2015-01-17 NOTE — Progress Notes (Signed)
Physical Therapy Treatment Patient Details Name: Stacie Wright MRN: [id number] DOB: June 03, 1928 Today's Date: 01/17/2015    History of Present Illness Stacie Wright is an 81 y.o. female with hx of recurrent PNA, with COPD and bronchiectasis, hx of MAI infection last year, anxiety, nonsmoker, presented to the ER as she has progressively feeling more SOB, persistent coughing, and feeling weak. She had coughs for many months, almost a year according to her husband. She recently was placed on a course of steroid, along with antibiotics, but her symptoms persisted. She denied fever or chills. In the ER, she was noted to be hypoxic, with Sat in the high 80's, and she was given supplemental oxygen. She has supposingly anaphylaxis with PCN, but she has been able to take Keflex previously. She had some blood tingued sputum tonight as well. Evaluation in the ER with CXR showing atypical infiltrate, unclear if bacterial PNA, or atypical infection like MAI. Her WBC is elevated to 19K, and her Hb was normal. She has a Cr of 1.26. Hopsitalist was asked to admit her for pneumonia.     PT Comments    Pt is progressing well.  She reports feeling well, just "tired".  She is independent with bed mobility and transfers.  No light headedness upon standing.  Her gait is stable with no assistive device and she was able to ascend/descend a flight of steps with no significant difficulty.  She tolerated standing exercise well and was given a written home exercise program to take home.  Follow Up Recommendations  No PT follow up (pt/husband prefer not to do any follow up PT)     Equipment Recommendations  None recommended by PT    Recommendations for Other Services  none     Precautions / Restrictions Precautions Precautions: None Restrictions Weight Bearing Restrictions: No    Mobility  Bed Mobility Overal bed mobility: Independent                Transfers Overall transfer level:  Independent                  Ambulation/Gait Ambulation/Gait assistance: Supervision Ambulation Distance (Feet): 350 Feet Assistive device: None Gait Pattern/deviations: WFL(Within Functional Limits)   Gait velocity interpretation: at or above normal speed for age/gender General Gait Details: pt instructed to intermittently turn head side to side during gait to challenge balance   Stairs Stairs: Yes Stairs assistance: Supervision Stair Management: One rail Right;Alternating pattern;Forwards;Step to pattern Number of Stairs: 10 General stair comments: pt tends to alternate steps but she was advised to do a "step to" pattern  Wheelchair Mobility    Modified Rankin (Stroke Patients Only)       Balance Overall balance assessment: No apparent balance deficits (not formally assessed)                                  Cognition Arousal/Alertness: Awake/alert Behavior During Therapy: WFL for tasks assessed/performed Overall Cognitive Status: Within Functional Limits for tasks assessed                      Exercises General Exercises - Lower Extremity Hip ABduction/ADduction: Standing;Both (sidestepping for 20' each direction) Heel Raises: AROM;Both;10 reps;Standing Mini-Sqauts: AROM;Both;10 reps;Standing    General Comments        Pertinent Vitals/Pain Pain Assessment: No/denies pain    Home Living Family/patient expects to be discharged to:: Private residence  Prior Function            PT Goals (current goals can now be found in the care plan section) Progress towards PT goals: Progressing toward goals    Frequency  Min 3X/week    PT Plan Discharge plan needs to be updated    Co-evaluation             End of Session Equipment Utilized During Treatment: Gait belt Activity Tolerance: Patient tolerated treatment well Patient left: in bed;with bed alarm set;with family/visitor present     Time:  0902-0928 PT Time Calculation (min) (ACUTE ONLY): 26 min  Charges:  $Gait Training: 8-22 mins $Therapeutic Exercise: 8-22 mins                    G Codes:      Konrad PentaBrown, Lucia Harm L 01/17/2015, 9:37 AM

## 2015-01-18 DIAGNOSIS — A310 Pulmonary mycobacterial infection: Secondary | ICD-10-CM | POA: Diagnosis present

## 2015-01-18 LAB — CBC
HCT: 35.5 % — ABNORMAL LOW (ref 36.0–46.0)
Hemoglobin: 11.7 g/dL — ABNORMAL LOW (ref 12.0–15.0)
MCH: 32.2 pg (ref 26.0–34.0)
MCHC: 33 g/dL (ref 30.0–36.0)
MCV: 97.8 fL (ref 78.0–100.0)
PLATELETS: 190 10*3/uL (ref 150–400)
RBC: 3.63 MIL/uL — AB (ref 3.87–5.11)
RDW: 14.7 % (ref 11.5–15.5)
WBC: 10.8 10*3/uL — ABNORMAL HIGH (ref 4.0–10.5)

## 2015-01-18 MED ORDER — GUAIFENESIN-CODEINE 100-10 MG/5ML PO SOLN
10.0000 mL | Freq: Four times a day (QID) | ORAL | Status: DC | PRN
  Administered 2015-01-18 – 2015-01-19 (×2): 10 mL via ORAL
  Filled 2015-01-18 (×2): qty 10

## 2015-01-18 MED ORDER — BISACODYL 10 MG RE SUPP
10.0000 mg | Freq: Every day | RECTAL | Status: DC | PRN
  Administered 2015-01-18: 10 mg via RECTAL
  Filled 2015-01-18: qty 1

## 2015-01-18 MED ORDER — ALBUTEROL SULFATE (2.5 MG/3ML) 0.083% IN NEBU
2.5000 mg | INHALATION_SOLUTION | Freq: Three times a day (TID) | RESPIRATORY_TRACT | Status: DC
  Administered 2015-01-18 – 2015-01-19 (×2): 2.5 mg via RESPIRATORY_TRACT
  Filled 2015-01-18 (×2): qty 3

## 2015-01-18 NOTE — Progress Notes (Signed)
Subjective: She says she feels better. She has still not had a bowel movement. Her weakness is better.  Objective: Vital signs in last 24 hours: Temp:  [97.9 F (36.6 C)-99.5 F (37.5 C)] 97.9 F (36.6 C) (05/05 0626) Pulse Rate:  [71-87] 71 (05/05 0626) Resp:  [16-20] 20 (05/05 0626) BP: (106-119)/(42-51) 111/42 mmHg (05/05 0626) SpO2:  [91 %-95 %] 91 % (05/05 0710) Weight change:  Last BM Date: 01/14/15  Intake/Output from previous day: 05/04 0701 - 05/05 0700 In: 1040 [P.O.:840; IV Piggyback:200] Out: 100 [Urine:100]  PHYSICAL EXAM General appearance: alert, cooperative and mild distress Resp: rales bilaterally Cardio: regular rate and rhythm, S1, S2 normal, no murmur, click, rub or gallop GI: soft, non-tender; bowel sounds normal; no masses,  no organomegaly Extremities: extremities normal, atraumatic, no cyanosis or edema  Lab Results:  Results for orders placed or performed during the hospital encounter of 01/15/15 (from the past 48 hour(s))  Basic metabolic panel     Status: Abnormal   Collection Time: 01/17/15  5:31 AM  Result Value Ref Range   Sodium 137 135 - 145 mmol/L   Potassium 3.4 (L) 3.5 - 5.1 mmol/L   Chloride 97 (L) 101 - 111 mmol/L   CO2 32 22 - 32 mmol/L   Glucose, Bld 100 (H) 70 - 99 mg/dL   BUN 22 (H) 6 - 20 mg/dL   Creatinine, Ser 0.99 0.44 - 1.00 mg/dL   Calcium 8.3 (L) 8.9 - 10.3 mg/dL   GFR calc non Af Amer 51 (L) >60 mL/min   GFR calc Af Amer 59 (L) >60 mL/min    Comment: (NOTE) The eGFR has been calculated using the CKD EPI equation. This calculation has not been validated in all clinical situations. eGFR's persistently <90 mL/min signify possible Chronic Kidney Disease.    Anion gap 8 5 - 15  CBC     Status: Abnormal   Collection Time: 01/18/15  5:58 AM  Result Value Ref Range   WBC 10.8 (H) 4.0 - 10.5 K/uL   RBC 3.63 (L) 3.87 - 5.11 MIL/uL   Hemoglobin 11.7 (L) 12.0 - 15.0 g/dL   HCT 35.5 (L) 36.0 - 46.0 %   MCV 97.8 78.0 - 100.0  fL   MCH 32.2 26.0 - 34.0 pg   MCHC 33.0 30.0 - 36.0 g/dL   RDW 14.7 11.5 - 15.5 %   Platelets 190 150 - 400 K/uL    ABGS No results for input(s): PHART, PO2ART, TCO2, HCO3 in the last 72 hours.  Invalid input(s): PCO2 CULTURES Recent Results (from the past 240 hour(s))  Blood culture (routine x 2)     Status: None (Preliminary result)   Collection Time: 01/15/15 10:00 PM  Result Value Ref Range Status   Specimen Description BLOOD LEFT ARM  Final   Special Requests BOTTLES DRAWN AEROBIC AND ANAEROBIC 6CC  Final   Culture NO GROWTH 2 DAYS  Final   Report Status PENDING  Incomplete  Blood culture (routine x 2)     Status: None (Preliminary result)   Collection Time: 01/15/15 10:40 PM  Result Value Ref Range Status   Specimen Description RIGHT ANTECUBITAL  Final   Special Requests BOTTLES DRAWN AEROBIC AND ANAEROBIC 6CC  Final   Culture NO GROWTH 2 DAYS  Final   Report Status PENDING  Incomplete   Studies/Results: No results found.  Medications:  Prior to Admission:  Prescriptions prior to admission  Medication Sig Dispense Refill Last Dose  . allopurinol (  ZYLOPRIM) 300 MG tablet Take 300 mg by mouth daily.   01/15/2015 at Unknown time  . aspirin EC 81 MG tablet Take 81 mg by mouth daily.   01/15/2015 at Unknown time  . atenolol (TENORMIN) 50 MG tablet Take 50 mg by mouth daily.   01/15/2015 at Unknown time  . beta carotene w/minerals (OCUVITE) tablet Take 1 tablet by mouth daily.   01/15/2015 at Unknown time  . brimonidine-timolol (COMBIGAN) 0.2-0.5 % ophthalmic solution Place 1 drop into both eyes every 12 (twelve) hours.   01/15/2015 at Unknown time  . dorzolamide (TRUSOPT) 2 % ophthalmic solution Place 1 drop into both eyes 2 (two) times daily.   01/15/2015 at Unknown time  . hyoscyamine (LEVBID) 0.375 MG 12 hr tablet Take 0.1875 mg by mouth every morning.    01/15/2015 at Unknown time  . latanoprost (XALATAN) 0.005 % ophthalmic solution Place 1 drop into both eyes at bedtime.   01/14/2015  at Unknown time  . PARoxetine (PAXIL) 20 MG tablet Take 20 mg by mouth every morning.   01/15/2015 at Unknown time  . triamterene-hydrochlorothiazide (MAXZIDE-25) 37.5-25 MG per tablet Take 1 tablet by mouth every other day.   01/14/2015 at Unknown time  . doxycycline (VIBRA-TABS) 100 MG tablet Take 1 tablet (100 mg total) by mouth 2 (two) times daily. (Patient not taking: Reported on 01/15/2015) 20 tablet 1 Taking  . levofloxacin (LEVAQUIN) 500 MG tablet Take 500 mg by mouth daily. [place] ON 01/08/2015   Completed Course at Unknown time  . methylPREDNISolone (MEDROL DOSEPAK) 4 MG TBPK tablet Take 4-24 mg by mouth See admin instructions.   Completed Course at Unknown time  . predniSONE (STERAPRED UNI-PAK) 10 MG tablet Take 1 tablet (10 mg total) by mouth daily. Take as directed (Patient not taking: Reported on 01/15/2015) 21 tablet 0 Taking   Scheduled: . albuterol  2.5 mg Nebulization Q6H  . antiseptic oral rinse  7 mL Mouth Rinse BID  . aspirin EC  81 mg Oral Daily  . atenolol  50 mg Oral Daily  . beta carotene w/minerals  1 tablet Oral Daily  . brimonidine  1 drop Both Eyes BID  . ceFEPime (MAXIPIME) IV  1 g Intravenous Q24H  . clarithromycin  500 mg Oral Q12H  . dorzolamide  1 drop Both Eyes BID  . feeding supplement (ENSURE)  1 Container Oral TID BM  . heparin  5,000 Units Subcutaneous 3 times per day  . latanoprost  1 drop Both Eyes QHS  . PARoxetine  20 mg Oral Daily  . polyethylene glycol  17 g Oral Daily  . timolol  1 drop Both Eyes BID  . vancomycin  750 mg Intravenous Q24H   Continuous:  UVO:[id number], bisacodyl, LORazepam  Assesment: She was admitted with pneumonia. She also has exacerbation of bronchiectasis. She has Mycobacterium avium intracellulare complex disease.  She is constipated.  She has hypertension which is pretty well controlled.  She has dementia at baseline and unchanged Principal Problem:   Bronchiectasis without acute exacerbation Active  Problems:   Abnormal chest x-ray   Hypertension   Hypoxia   COPD exacerbation   Pneumonia    Plan: Continue current medications and treatments. She may be able to be discharged tomorrow    LOS: 3 days   Lilybelle Mayeda L 01/18/2015, 8:29 AM

## 2015-01-18 NOTE — Progress Notes (Signed)
Pt has persistent, productive cough.  No PRN medicine ordered.  Called Dr Karilyn CotaGosrani, on call for primary care and left message.  Awaiting call back at this time

## 2015-01-19 DIAGNOSIS — G309 Alzheimer's disease, unspecified: Secondary | ICD-10-CM

## 2015-01-19 DIAGNOSIS — F0280 Dementia in other diseases classified elsewhere without behavioral disturbance: Secondary | ICD-10-CM | POA: Diagnosis present

## 2015-01-19 DIAGNOSIS — F32A Depression, unspecified: Secondary | ICD-10-CM | POA: Diagnosis present

## 2015-01-19 DIAGNOSIS — F411 Generalized anxiety disorder: Secondary | ICD-10-CM | POA: Diagnosis present

## 2015-01-19 DIAGNOSIS — F329 Major depressive disorder, single episode, unspecified: Secondary | ICD-10-CM | POA: Diagnosis present

## 2015-01-19 LAB — BASIC METABOLIC PANEL
Anion gap: 7 (ref 5–15)
BUN: 19 mg/dL (ref 6–20)
CO2: 31 mmol/L (ref 22–32)
Calcium: 8.4 mg/dL — ABNORMAL LOW (ref 8.9–10.3)
Chloride: 99 mmol/L — ABNORMAL LOW (ref 101–111)
Creatinine, Ser: 0.95 mg/dL (ref 0.44–1.00)
GFR calc non Af Amer: 53 mL/min — ABNORMAL LOW (ref >60)
Glucose, Bld: 97 mg/dL (ref 70–99)
POTASSIUM: 3.9 mmol/L (ref 3.5–5.1)
Sodium: 137 mmol/L (ref 135–145)

## 2015-01-19 LAB — LEGIONELLA ANTIGEN, URINE

## 2015-01-19 LAB — CBC
HCT: 39.5 % (ref 36.0–46.0)
HEMOGLOBIN: 12.6 g/dL (ref 12.0–15.0)
MCH: 31.5 pg (ref 26.0–34.0)
MCHC: 31.9 g/dL (ref 30.0–36.0)
MCV: 98.8 fL (ref 78.0–100.0)
PLATELETS: 222 10*3/uL (ref 150–400)
RBC: 4 MIL/uL (ref 3.87–5.11)
RDW: 14.8 % (ref 11.5–15.5)
WBC: 10.9 10*3/uL — ABNORMAL HIGH (ref 4.0–10.5)

## 2015-01-19 MED ORDER — ALBUTEROL SULFATE (2.5 MG/3ML) 0.083% IN NEBU
2.5000 mg | INHALATION_SOLUTION | Freq: Four times a day (QID) | RESPIRATORY_TRACT | Status: DC | PRN
Start: 2015-01-19 — End: 2015-11-01

## 2015-01-19 MED ORDER — CLARITHROMYCIN 500 MG PO TABS: 500.0000 mg | ORAL_TABLET | Freq: Two times a day (BID) | ORAL | Status: DC

## 2015-01-19 MED ORDER — GUAIFENESIN-CODEINE 100-10 MG/5ML PO SOLN: 10.0000 mL | Freq: Four times a day (QID) | ORAL | Status: DC | PRN

## 2015-01-19 MED ORDER — PREDNISONE 10 MG PO TABS: ORAL_TABLET | ORAL | Status: DC

## 2015-01-19 MED ORDER — DOXYCYCLINE HYCLATE 50 MG PO CAPS: 100.0000 mg | ORAL_CAPSULE | Freq: Two times a day (BID) | ORAL | Status: DC

## 2015-01-19 NOTE — Progress Notes (Signed)
Patient discharged with instructions, prescription, and care notes.  Verbalized understanding via teach back.  IV was removed and the site was WNL. Patient voiced no further complaints or concerns at the time of discharge.  Appointments  per instructions.  Patient left the floor via w/c with staff and family in stable condition.

## 2015-01-19 NOTE — Discharge Summary (Signed)
Physician Discharge Summary  Patient ID: Stacie Wright MRN: [id number] DOB/AGE: 1928-04-14 81 y.o. Primary Care Physician:Sharyon Peitz L, MD Admit date: 01/15/2015 Discharge date: 01/19/2015    Discharge Diagnoses:   Principal Problem:   Abnormal chest x-ray Active Problems:   Hypertension   Generalized weakness   Hypoxia   COPD exacerbation   Bronchiectasis without acute exacerbation   Constipation   Pneumonia   Mycobacterium avium-intracellulare infection   Generalized anxiety disorder   Depression   Dementia in Alzheimer's disease     Medication List    STOP taking these medications        levofloxacin 500 MG tablet  Commonly known as:  LEVAQUIN     methylPREDNISolone 4 MG Tbpk tablet  Commonly known as:  MEDROL DOSEPAK     predniSONE 10 MG tablet  Commonly known as:  STERAPRED UNI-PAK  Replaced by:  predniSONE 10 MG tablet      TAKE these medications        albuterol (2.5 MG/3ML) 0.083% nebulizer solution  Commonly known as:  PROVENTIL  Take 3 mLs (2.5 mg total) by nebulization every 6 (six) hours as needed for wheezing or shortness of breath.     allopurinol 300 MG tablet  Commonly known as:  ZYLOPRIM  Take 300 mg by mouth daily.     aspirin EC 81 MG tablet  Take 81 mg by mouth daily.     atenolol 50 MG tablet  Commonly known as:  TENORMIN  Take 50 mg by mouth daily.     beta carotene w/minerals tablet  Take 1 tablet by mouth daily.     clarithromycin 500 MG tablet  Commonly known as:  BIAXIN  Take 1 tablet (500 mg total) by mouth every 12 (twelve) hours.     COMBIGAN 0.2-0.5 % ophthalmic solution  Generic drug:  brimonidine-timolol  Place 1 drop into both eyes every 12 (twelve) hours.     dorzolamide 2 % ophthalmic solution  Commonly known as:  TRUSOPT  Place 1 drop into both eyes 2 (two) times daily.     doxycycline 100 MG tablet  Commonly known as:  VIBRA-TABS  Take 1 tablet (100 mg total) by mouth 2 (two) times daily.     doxycycline 50 MG capsule  Commonly known as:  VIBRAMYCIN  Take 2 capsules (100 mg total) by mouth 2 (two) times daily.     guaiFENesin-codeine 100-10 MG/5ML syrup  Take 10 mLs by mouth every 6 (six) hours as needed for cough.     hyoscyamine 0.375 MG 12 hr tablet  Commonly known as:  LEVBID  Take 0.1875 mg by mouth every morning.     latanoprost 0.005 % ophthalmic solution  Commonly known as:  XALATAN  Place 1 drop into both eyes at bedtime.     PARoxetine 20 MG tablet  Commonly known as:  PAXIL  Take 20 mg by mouth every morning.     predniSONE 10 MG tablet  Commonly known as:  DELTASONE  4x3 d,3x3d,2x3d,1x3d then stop     triamterene-hydrochlorothiazide 37.5-25 MG per tablet  Commonly known as:  MAXZIDE-25  Take 1 tablet by mouth every other day.        Discharged Condition: Improved    Consults: None  Significant Diagnostic Studies: Dg Chest 2 View  01/15/2015   CLINICAL DATA:  Cough.  Productive cough with brown sputum.  EXAM: CHEST  2 VIEW  COMPARISON:  Chest radiograph 04/25/2013.  Chest CTA 04/25/2013.  FINDINGS: Progressive  consolidation in the lingula and RIGHT middle lobe evident on the lateral view. Diffuse interstitial prominence is present with a reticulonodular pattern most pronounced at the bases. The cardiopericardial silhouette is within normal limits. Mediastinal contours are normal. No pleural effusion.  IMPRESSION: Progressive RIGHT middle lobe and lingular consolidation. In this age group, this most likely represents atypical infection/mycobacterium avium. Superimposed bacterial pneumonia is in the differential considerations.   Electronically Signed   By: Andreas NewportGeoffrey  Lamke M.D.   On: 01/15/2015 22:19    Lab Results: Basic Metabolic Panel:  Recent Labs  09/32/3503/12/30 0531 01/19/15 0608  NA 137 137  K 3.4* 3.9  CL 97* 99*  CO2 32 31  GLUCOSE 100* 97  BUN 22* 19  CREATININE 0.99 0.95  CALCIUM 8.3* 8.4*   Liver Function Tests: No results for  input(s): AST, ALT, ALKPHOS, BILITOT, PROT, ALBUMIN in the last 72 hours.   CBC:  Recent Labs  01/18/15 0558 01/19/15 0608  WBC 10.8* 10.9*  HGB 11.7* 12.6  HCT 35.5* 39.5  MCV 97.8 98.8  PLT 190 222    Recent Results (from the past 240 hour(s))  Blood culture (routine x 2)     Status: None (Preliminary result)   Collection Time: 01/15/15 10:00 PM  Result Value Ref Range Status   Specimen Description BLOOD LEFT ARM  Final   Special Requests BOTTLES DRAWN AEROBIC AND ANAEROBIC 6CC  Final   Culture NO GROWTH 4 DAYS  Final   Report Status PENDING  Incomplete  Blood culture (routine x 2)     Status: None (Preliminary result)   Collection Time: 01/15/15 10:40 PM  Result Value Ref Range Status   Specimen Description RIGHT ANTECUBITAL  Final   Special Requests BOTTLES DRAWN AEROBIC AND ANAEROBIC 6CC  Final   Culture NO GROWTH 4 DAYS  Final   Report Status PENDING  Incomplete     Hospital Course: This is an 81 year old who at baseline has bronchiectasis and Mycobacterium avium infection. She is on a course of 18 months of Biaxin. This had been discussed with infectious disease and she has seen the infectious disease specialist but is not currently following with them. She has continued to have cough and sputum production. She came to the emergency department because she became very sick with nausea vomiting. She was noted to have increasing infiltrates on chest x-ray and is felt to have pneumonia in addition to her other problems. She had some trouble with confusion related to her dementia. She is back at baseline at the time of discharge  Discharge Exam: Blood pressure 102/48, pulse 78, temperature 98.2 F (36.8 C), temperature source Oral, resp. rate 20, height 5\' 8"  (1.727 m), weight 67.677 kg (149 lb 3.2 oz), SpO2 93 %. She is awake and alert. Her chest is clearer but she still has rhonchi and rales which are chronic  Disposition: Home she is going to have a nebulizer machine but  she and her family do not want home health services      Discharge Instructions    DME Nebulizer machine    Complete by:  As directed      Discharge patient    Complete by:  As directed              Signed: Deniz Eskridge L   01/19/2015, 8:37 AM

## 2015-01-19 NOTE — Progress Notes (Signed)
Subjective: She says she feels some better. She was confused yesterday.  Objective: Vital signs in last 24 hours: Temp:  [98 F (36.7 C)-98.2 F (36.8 C)] 98.2 F (36.8 C) (05/06 0435) Pulse Rate:  [75-79] 78 (05/06 0435) Resp:  [20] 20 (05/06 0435) BP: (102-115)/(48-50) 102/48 mmHg (05/06 0435) SpO2:  [90 %-96 %] 93 % (05/06 0723) Weight change:  Last BM Date: 01/18/15  Intake/Output from previous day: 05/05 0701 - 05/06 0700 In: 1280 [P.O.:1080; IV Piggyback:200] Out: -   PHYSICAL EXAM General appearance: alert and mild distress Resp: rales bilaterally and rhonchi bilaterally Cardio: regular rate and rhythm, S1, S2 normal, no murmur, click, rub or gallop GI: soft, non-tender; bowel sounds normal; no masses,  no organomegaly Extremities: extremities normal, atraumatic, no cyanosis or edema  Lab Results:  Results for orders placed or performed during the hospital encounter of 01/15/15 (from the past 48 hour(s))  CBC     Status: Abnormal   Collection Time: 01/18/15  5:58 AM  Result Value Ref Range   WBC 10.8 (H) 4.0 - 10.5 K/uL   RBC 3.63 (L) 3.87 - 5.11 MIL/uL   Hemoglobin 11.7 (L) 12.0 - 15.0 g/dL   HCT 35.5 (L) 36.0 - 46.0 %   MCV 97.8 78.0 - 100.0 fL   MCH 32.2 26.0 - 34.0 pg   MCHC 33.0 30.0 - 36.0 g/dL   RDW 14.7 11.5 - 15.5 %   Platelets 190 150 - 400 K/uL  Basic metabolic panel     Status: Abnormal   Collection Time: 01/19/15  6:08 AM  Result Value Ref Range   Sodium 137 135 - 145 mmol/L   Potassium 3.9 3.5 - 5.1 mmol/L   Chloride 99 (L) 101 - 111 mmol/L   CO2 31 22 - 32 mmol/L   Glucose, Bld 97 70 - 99 mg/dL   BUN 19 6 - 20 mg/dL   Creatinine, Ser 0.95 0.44 - 1.00 mg/dL   Calcium 8.4 (L) 8.9 - 10.3 mg/dL   GFR calc non Af Amer 53 (L) >60 mL/min   GFR calc Af Amer >60 >60 mL/min    Comment: (NOTE) The eGFR has been calculated using the CKD EPI equation. This calculation has not been validated in all clinical situations. eGFR's persistently <60 mL/min  signify possible Chronic Kidney Disease.    Anion gap 7 5 - 15  CBC     Status: Abnormal   Collection Time: 01/19/15  6:08 AM  Result Value Ref Range   WBC 10.9 (H) 4.0 - 10.5 K/uL   RBC 4.00 3.87 - 5.11 MIL/uL   Hemoglobin 12.6 12.0 - 15.0 g/dL   HCT 39.5 36.0 - 46.0 %   MCV 98.8 78.0 - 100.0 fL   MCH 31.5 26.0 - 34.0 pg   MCHC 31.9 30.0 - 36.0 g/dL   RDW 14.8 11.5 - 15.5 %   Platelets 222 150 - 400 K/uL    ABGS No results for input(s): PHART, PO2ART, TCO2, HCO3 in the last 72 hours.  Invalid input(s): PCO2 CULTURES Recent Results (from the past 240 hour(s))  Blood culture (routine x 2)     Status: None (Preliminary result)   Collection Time: 01/15/15 10:00 PM  Result Value Ref Range Status   Specimen Description BLOOD LEFT ARM  Final   Special Requests BOTTLES DRAWN AEROBIC AND ANAEROBIC 6CC  Final   Culture NO GROWTH 4 DAYS  Final   Report Status PENDING  Incomplete  Blood culture (routine x  2)     Status: None (Preliminary result)   Collection Time: 01/15/15 10:40 PM  Result Value Ref Range Status   Specimen Description RIGHT ANTECUBITAL  Final   Special Requests BOTTLES DRAWN AEROBIC AND ANAEROBIC 6CC  Final   Culture NO GROWTH 4 DAYS  Final   Report Status PENDING  Incomplete   Studies/Results: No results found.  Medications:  Prior to Admission:  Prescriptions prior to admission  Medication Sig Dispense Refill Last Dose  . allopurinol (ZYLOPRIM) 300 MG tablet Take 300 mg by mouth daily.   01/15/2015 at Unknown time  . aspirin EC 81 MG tablet Take 81 mg by mouth daily.   01/15/2015 at Unknown time  . atenolol (TENORMIN) 50 MG tablet Take 50 mg by mouth daily.   01/15/2015 at Unknown time  . beta carotene w/minerals (OCUVITE) tablet Take 1 tablet by mouth daily.   01/15/2015 at Unknown time  . brimonidine-timolol (COMBIGAN) 0.2-0.5 % ophthalmic solution Place 1 drop into both eyes every 12 (twelve) hours.   01/15/2015 at Unknown time  . dorzolamide (TRUSOPT) 2 %  ophthalmic solution Place 1 drop into both eyes 2 (two) times daily.   01/15/2015 at Unknown time  . hyoscyamine (LEVBID) 0.375 MG 12 hr tablet Take 0.1875 mg by mouth every morning.    01/15/2015 at Unknown time  . latanoprost (XALATAN) 0.005 % ophthalmic solution Place 1 drop into both eyes at bedtime.   01/14/2015 at Unknown time  . PARoxetine (PAXIL) 20 MG tablet Take 20 mg by mouth every morning.   01/15/2015 at Unknown time  . triamterene-hydrochlorothiazide (MAXZIDE-25) 37.5-25 MG per tablet Take 1 tablet by mouth every other day.   01/14/2015 at Unknown time  . doxycycline (VIBRA-TABS) 100 MG tablet Take 1 tablet (100 mg total) by mouth 2 (two) times daily. (Patient not taking: Reported on 01/15/2015) 20 tablet 1 Taking  . levofloxacin (LEVAQUIN) 500 MG tablet Take 500 mg by mouth daily. [place] ON 01/08/2015   Completed Course at Unknown time  . methylPREDNISolone (MEDROL DOSEPAK) 4 MG TBPK tablet Take 4-24 mg by mouth See admin instructions.   Completed Course at Unknown time  . predniSONE (STERAPRED UNI-PAK) 10 MG tablet Take 1 tablet (10 mg total) by mouth daily. Take as directed (Patient not taking: Reported on 01/15/2015) 21 tablet 0 Taking   Scheduled: . albuterol  2.5 mg Nebulization TID  . antiseptic oral rinse  7 mL Mouth Rinse BID  . aspirin EC  81 mg Oral Daily  . atenolol  50 mg Oral Daily  . beta carotene w/minerals  1 tablet Oral Daily  . brimonidine  1 drop Both Eyes BID  . ceFEPime (MAXIPIME) IV  1 g Intravenous Q24H  . clarithromycin  500 mg Oral Q12H  . dorzolamide  1 drop Both Eyes BID  . feeding supplement (ENSURE)  1 Container Oral TID BM  . heparin  5,000 Units Subcutaneous 3 times per day  . latanoprost  1 drop Both Eyes QHS  . PARoxetine  20 mg Oral Daily  . polyethylene glycol  17 g Oral Daily  . timolol  1 drop Both Eyes BID  . vancomycin  750 mg Intravenous Q24H   Continuous:  JGG:[id number], bisacodyl, guaiFENesin-codeine, LORazepam  Assesment: She  was admitted with community-acquired pneumonia. She has been treated more as healthcare associated pneumonia because she has severe bronchiectasis and has been on antibiotics for Mycobacterium avium infection for about 15 months. She has improved as  far as her breathing is concerned. She has continued chronic productive cough which has been present for at least 2 years.  She has dementia at baseline and has had some acute confusion I think from being out of her normal environment. I have discussed treatment of dementia with her husband on several occasions and he is reluctant to pursue any sort of medications  She has generalized weakness but did better when she was checked by physical therapy.  She has bronchiectasis/COPD and both of course are worse with her acute infection.  She has had trouble with anxiety and depression and that is stable on current medications Principal Problem:   Bronchiectasis without acute exacerbation Active Problems:   Abnormal chest x-ray   Hypertension   Generalized weakness   Hypoxia   COPD exacerbation   Constipation   Pneumonia   Mycobacterium avium-intracellulare infection    Plan: Discharge home today. They do not want home health services    LOS: 4 days   Nyair Depaulo L 01/19/2015, 8:24 AM

## 2015-01-19 NOTE — Care Management Note (Signed)
Case Management Note  Patient Details  Name: Stacie Wright MRN: [id number] Date of Birth: September 22, 1927  Subjective/Objective:                    Action/Plan:   Expected Discharge Date:  01/19/15               Expected Discharge Plan:  Home/Self Care  In-House Referral:  NA  Discharge planning Services  CM Consult  Post Acute Care Choice:  Durable Medical Equipment Choice offered to:  Patient  DME Arranged:  Nebulizer machine DME Agency:   Ella Jubilee([facility] per family choice)  HH Arranged:    HH Agency:     Status of Service:  Completed, signed off  Medicare Important Message Given:  Yes Date Medicare IM Given:  01/19/15 Medicare IM give by:  Arlyss Queenammy Elexis Pollak, RN BSN CM Date Additional Medicare IM Given:    Additional Medicare Important Message give by:     If discussed at Long Length of Stay Meetings, dates discussed:    Additional Comments: Pt discharged home today. [facility]. Husband will pick DME up. Pt and pts nurse aware of discharge arrangements. Arlyss QueenBlackwell, Lorella Gomez [employer] [facility], RN 01/19/2015, 9:27 AM

## 2015-01-20 LAB — CULTURE, BLOOD (ROUTINE X 2)
CULTURE: NO GROWTH
Culture: NO GROWTH

## 2015-01-22 NOTE — Progress Notes (Signed)
UR chart review completed.  

## 2015-06-20 ENCOUNTER — Ambulatory Visit (INDEPENDENT_AMBULATORY_CARE_PROVIDER_SITE_OTHER): Payer: Medicare HMO | Admitting: Infectious Disease

## 2015-06-20 ENCOUNTER — Encounter: Payer: Self-pay | Admitting: Infectious Disease

## 2015-06-20 VITALS — BP 182/87 | HR 80 | Wt 155.0 lb

## 2015-06-20 DIAGNOSIS — J470 Bronchiectasis with acute lower respiratory infection: Secondary | ICD-10-CM | POA: Diagnosis not present

## 2015-06-20 DIAGNOSIS — A310 Pulmonary mycobacterial infection: Secondary | ICD-10-CM

## 2015-06-20 DIAGNOSIS — L03818 Cellulitis of other sites: Secondary | ICD-10-CM

## 2015-06-20 DIAGNOSIS — Z77120 Contact with and (suspected) exposure to mold (toxic): Secondary | ICD-10-CM | POA: Diagnosis not present

## 2015-06-20 HISTORY — DX: Contact with and (suspected) exposure to mold (toxic): Z77.120

## 2015-06-20 LAB — COMPLETE METABOLIC PANEL WITH GFR
ALT: 13 U/L (ref 6–29)
AST: 21 U/L (ref 10–35)
Albumin: 4 g/dL (ref 3.6–5.1)
Alkaline Phosphatase: 84 U/L (ref 33–130)
BUN: 22 mg/dL (ref 7–25)
CALCIUM: 9.1 mg/dL (ref 8.6–10.4)
CHLORIDE: 100 mmol/L (ref 98–110)
CO2: 28 mmol/L (ref 20–31)
Creat: 1.15 mg/dL — ABNORMAL HIGH (ref 0.60–0.88)
GFR, EST AFRICAN AMERICAN: 50 mL/min — AB (ref >=60)
GFR, Est Non African American: 43 mL/min — ABNORMAL LOW (ref >=60)
Glucose, Bld: 105 mg/dL — ABNORMAL HIGH (ref 65–99)
Potassium: 4.6 mmol/L (ref 3.5–5.3)
Sodium: 140 mmol/L (ref 135–146)
Total Bilirubin: 0.8 mg/dL (ref 0.2–1.2)
Total Protein: 7.4 g/dL (ref 6.1–8.1)

## 2015-06-20 LAB — CBC WITH DIFFERENTIAL/PLATELET
Basophils Absolute: 0 10*3/uL (ref 0.0–0.1)
Basophils Relative: 0 % (ref 0–1)
Eosinophils Absolute: 0.6 10*3/uL (ref 0.0–0.7)
Eosinophils Relative: 5 % (ref 0–5)
HCT: 43 % (ref 36.0–46.0)
HEMOGLOBIN: 14.2 g/dL (ref 12.0–15.0)
Lymphocytes Relative: 19 % (ref 12–46)
Lymphs Abs: 2.2 10*3/uL (ref 0.7–4.0)
MCH: 31.7 pg (ref 26.0–34.0)
MCHC: 33 g/dL (ref 30.0–36.0)
MCV: 96 fL (ref 78.0–100.0)
MPV: 11.1 fL (ref 8.6–12.4)
Monocytes Absolute: 0.9 10*3/uL (ref 0.1–1.0)
Monocytes Relative: 8 % (ref 3–12)
NEUTROS ABS: 7.8 10*3/uL — AB (ref 1.7–7.7)
NEUTROS PCT: 68 % (ref 43–77)
Platelets: 240 10*3/uL (ref 150–400)
RBC: 4.48 MIL/uL (ref 3.87–5.11)
RDW: 14.4 % (ref 11.5–15.5)
WBC: 11.4 10*3/uL — AB (ref 4.0–10.5)

## 2015-06-20 NOTE — Progress Notes (Signed)
[facility]   Chief complaint: cough and malaise and workup for her M avium infeciton    Subjective:    Patient ID: Stacie Wright, female    DOB: 21-Nov-1927, 81 y.o.   MRN: [id number]  HPI   81 y.o. female with  COPD but not a smoker and claims to not have had asthma. We saw her in the hospital for HCAP workup and she had CT showing:  " multifocal infection, worse  within the lingula and right middle lobe, with associated  consolidative opacities and areas of bronchiectasis - as such, atypical etiologies (such as MAC) should be considered. And  Extensive basilar predominant tree in bud opacities are  worrisome for airways disseminated infection and/or aspiration"   WE obtained sputum for AFB, bacterial cx adn Fungal cx. Resp cultures were with OPF. Her B. 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 She finished a course of levaquin for her pna with bronchiectasis type duration (21days).    Along the way she injured her left lower extremity where she has hardware in place and had a draining area.. She was seen by Dr. Percell Miller who apparently applied a type of "glue" to this area. There was still substantial erythema which has progressed despite her continued use of levofloxacin when I saw her 2 visits ago (> 2 years ago)  I changed her to doxycyline and got MRI of the leg which showed:  IMPRESSION:  1. Distal fibula lateral plate and screw fixation. Artifact obscures  the distal lateral leg and portions of the ankle.  2. Left leg swelling and subcutaneous edema which may represent  dependent edema or cellulitis in the appropriate clinical setting.  There is no abscess. No visible osteomyelitis is present.  3. Because of the fixation hardware, CT without infusion may be  useful to assess for changes of chronic osteomyelitis.   We considered treating her  Mycobacterium avium  infection but because AT THAT TIME  she denied weight loss or fevers she does have a nightly cough although is not as productive as it has been in the past, and she was without rx drug plan we opted not to start her. I then did not see her for next 2 years. In the interim she apparently has been treated for M avium with biaxin MONOTHERAPY, which I do not understand since this seems like a sure fire way to risk macrolide resistance.  She feels poorly with poor appetite, malaise and cough throughout the day.  Past Medical History  Diagnosis Date  . Hypertension   . COPD (chronic obstructive pulmonary disease) ([facility])   . Anxiety   . Shortness of breath   . Pneumonia 04/2013  . Headache(784.0)   . Arthritis   . MAI (mycobacterium avium-intracellulare) infection ([facility]) 2014  . Bronchiectasis ([facility]) 2014  . IBS (irritable bowel syndrome)   . Gout   . DVT (deep venous thrombosis) ([facility]) 2008    LLE    Past Surgical History  Procedure Laterality Date  . Abdominal hysterectomy    . Cholecystectomy    . Fracture surgery    . Cataract extraction      Family History  Problem Relation Age of Onset  . Diabetes Father   . Hypertension Father       Social History   Social History  . Marital Status: Married    Spouse Name: N/A  . Number  of Children: N/A  . Years of Education: N/A   Social History Main Topics  . Smoking status: Never Smoker   . Smokeless tobacco: Never Used  . Alcohol Use: No  . Drug Use: No  . Sexual Activity: Not Asked   Other Topics Concern  . None   Social History Narrative    Allergies  Allergen Reactions  . Amoxicillin Anaphylaxis and Rash  . Penicillins Anaphylaxis and Rash  . Sulfa Antibiotics Anaphylaxis and Rash  . Darvocet [Propoxyphene N-Acetaminophen]     hallucinations     Current outpatient prescriptions:  .  albuterol (PROVENTIL) (2.5 MG/3ML) 0.083% nebulizer solution, Take 3 mLs (2.5 mg total) by nebulization every 6 (six) hours as needed  for wheezing or shortness of breath., Disp: 75 mL, Rfl: 12 .  allopurinol (ZYLOPRIM) 300 MG tablet, Take 300 mg by mouth daily., Disp: , Rfl:  .  aspirin EC 81 MG tablet, Take 81 mg by mouth daily., Disp: , Rfl:  .  atenolol (TENORMIN) 50 MG tablet, Take 50 mg by mouth daily., Disp: , Rfl:  .  brimonidine-timolol (COMBIGAN) 0.2-0.5 % ophthalmic solution, Place 1 drop into both eyes every 12 (twelve) hours., Disp: , Rfl:  .  dorzolamide (TRUSOPT) 2 % ophthalmic solution, Place 1 drop into both eyes 2 (two) times daily., Disp: , Rfl:  .  hyoscyamine (LEVBID) 0.375 MG 12 hr tablet, Take 0.1875 mg by mouth every morning. , Disp: , Rfl:  .  latanoprost (XALATAN) 0.005 % ophthalmic solution, Place 1 drop into both eyes at bedtime., Disp: , Rfl:  .  PARoxetine (PAXIL) 20 MG tablet, Take 20 mg by mouth every morning., Disp: , Rfl:  .  triamterene-hydrochlorothiazide (MAXZIDE-25) 37.5-25 MG per tablet, Take 1 tablet by mouth every other day., Disp: , Rfl:  .  beta carotene w/minerals (OCUVITE) tablet, Take 1 tablet by mouth daily., Disp: , Rfl:  .  guaiFENesin-codeine 100-10 MG/5ML syrup, Take 10 mLs by mouth every 6 (six) hours as needed for cough. (Patient not taking: Reported on 06/20/2015), Disp: 120 mL, Rfl: 0 .  predniSONE (DELTASONE) 10 MG tablet, 4x3 d,3x3d,2x3d,1x3d then stop (Patient not taking: Reported on 06/20/2015), Disp: 30 tablet, Rfl: 0     Review of Systems  Constitutional: Positive for appetite change and fatigue. Negative for fever, chills, diaphoresis, activity change and unexpected weight change.  HENT: Negative for congestion, rhinorrhea, sinus pressure, sneezing, sore throat and trouble swallowing.   Eyes: Negative for photophobia and visual disturbance.  Respiratory: Positive for cough. Negative for chest tightness, shortness of breath, wheezing and stridor.   Cardiovascular: Negative for chest pain, palpitations and leg swelling.  Gastrointestinal: Positive for nausea. Negative  for vomiting, abdominal pain, diarrhea, constipation, blood in stool, abdominal distention and anal bleeding.  Genitourinary: Negative for dysuria, hematuria, flank pain and difficulty urinating.  Musculoskeletal: Negative for myalgias, back pain, joint swelling, arthralgias and gait problem.  Skin: Positive for wound. Negative for color change, pallor and rash.  Neurological: Negative for dizziness, tremors, weakness and light-headedness.  Hematological: Negative for adenopathy. Does not bruise/bleed easily.  Psychiatric/Behavioral: Negative for behavioral problems, confusion, sleep disturbance, dysphoric mood, decreased concentration and agitation.       Objective:   Physical Exam  Constitutional: She is oriented to person, place, and time. No distress.  HENT:  Head: Normocephalic and atraumatic.  Mouth/Throat: Oropharynx is clear and moist. No oropharyngeal exudate.  Eyes: Conjunctivae and EOM are normal. Pupils are equal, round, and reactive to light. No  scleral icterus.  Neck: Normal range of motion. Neck supple. No JVD present.  Cardiovascular: Normal rate, regular rhythm and normal heart sounds.  Exam reveals no gallop and no friction rub.   No murmur heard. Pulmonary/Chest: Effort normal. No respiratory distress. She has decreased breath sounds in the right lower field and the left lower field. She has wheezes. She has rhonchi in the right lower field and the left lower field. She has rales. She exhibits no tenderness.  Abdominal: She exhibits no distension and no mass. There is no tenderness. There is no rebound and no guarding.  Musculoskeletal: She exhibits no edema or tenderness.       Legs: Lymphadenopathy:    She has no cervical adenopathy.  Neurological: She is alert and oriented to person, place, and time. She has normal reflexes. She exhibits normal muscle tone. Coordination normal.  Skin: Skin is warm and dry. She is not diaphoretic. There is erythema. No pallor.    Psychiatric: She has a normal mood and affect. Her behavior is normal.          Assessment & Plan:   #1 Mavium intracellulare with bronchiectasis : I am stopping her Clarithromycin and will check CMP, CBC c diff, ESR. I am supplying her with specimen to collect early am culture for AFB. If we can grow her M avium and then send for susceptibility testing we can have a rationale approach to her therapy and hopefully she will NOT have macrolide R. 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  #3 Prior cellulitis: not active    I spent greater than 40 minutes with the patient including greater than 50% of time in face to face counsel of the patient and her husband re her M avium infection, bronchiectasis and in coordination of their care.

## 2015-06-21 ENCOUNTER — Other Ambulatory Visit: Payer: Medicare HMO

## 2015-06-21 DIAGNOSIS — A310 Pulmonary mycobacterial infection: Secondary | ICD-10-CM

## 2015-06-21 LAB — SEDIMENTATION RATE: SED RATE: 38 mm/h — AB (ref 0–30)

## 2015-06-22 ENCOUNTER — Other Ambulatory Visit: Payer: Self-pay | Admitting: Infectious Disease

## 2015-06-22 NOTE — Addendum Note (Signed)
Addended by: Wendall Mola A on: 06/22/2015 09:20 AM   Modules accepted: Orders

## 2015-07-25 ENCOUNTER — Encounter: Payer: Self-pay | Admitting: Infectious Disease

## 2015-07-25 ENCOUNTER — Ambulatory Visit (INDEPENDENT_AMBULATORY_CARE_PROVIDER_SITE_OTHER): Payer: Medicare HMO | Admitting: Infectious Disease

## 2015-07-25 VITALS — BP 172/99 | HR 70 | Temp 97.9°F | Wt 157.0 lb

## 2015-07-25 DIAGNOSIS — A310 Pulmonary mycobacterial infection: Secondary | ICD-10-CM | POA: Diagnosis not present

## 2015-07-25 DIAGNOSIS — J471 Bronchiectasis with (acute) exacerbation: Secondary | ICD-10-CM

## 2015-07-25 DIAGNOSIS — G309 Alzheimer's disease, unspecified: Secondary | ICD-10-CM

## 2015-07-25 DIAGNOSIS — F0280 Dementia in other diseases classified elsewhere without behavioral disturbance: Secondary | ICD-10-CM

## 2015-07-25 NOTE — Progress Notes (Signed)
[facility]   Chief complaint: cough and malaise and workup for her M avium infeciton    Subjective:    Patient ID: Stacie Wright, female    DOB: 12-Jan-1928, 81 y.o.   MRN: [id number]  HPI   82 y.o. female with  COPD but not a smoker and claims to not have had asthma. We saw her in the hospital for HCAP workup and she had CT showing:  " multifocal infection, worse  within the lingula and right middle lobe, with associated  consolidative opacities and areas of bronchiectasis - as such, atypical etiologies (such as MAC) should be considered. And  Extensive basilar predominant tree in bud opacities are  worrisome for airways disseminated infection and/or aspiration"   WE obtained sputum for AFB, bacterial cx adn Fungal cx. Resp cultures were with OPF. Her B. 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 She finished a course of levaquin for her pna with bronchiectasis type duration (21days).    Along the way she injured her left lower extremity where she has hardware in place and had a draining area.. She was seen by Dr. Eulah Pont who apparently applied a type of "glue" to this area. There was still substantial erythema which has progressed despite her continued use of levofloxacin when I saw her 2 visits ago (> 2 years ago)  I changed her to doxycyline and got MRI of the leg which showed:  IMPRESSION:  1. Distal fibula lateral plate and screw fixation. Artifact obscures  the distal lateral leg and portions of the ankle.  2. Left leg swelling and subcutaneous edema which may represent  dependent edema or cellulitis in the appropriate clinical setting.  There is no abscess. No visible osteomyelitis is present.  3. Because of the fixation hardware, CT without infusion may be  useful to assess for changes of chronic osteomyelitis.   We considered treating her  Mycobacterium avium  infection but because AT THAT TIME  she denied weight loss or fevers she does have a nightly cough although is not as productive as it has been in the past, and she was without rx drug plan we opted not to start her. I then did not see her for next 2 years. In the interim she apparently has been treated for M avium with biaxin MONOTHERAPY, which I do not understand since this seems like a sure fire way to risk macrolide resistance.  When I last saw her she felt poorly with poor appetite, malaise and cough throughout the day. We stopped her biaxin and obtained AFB sputum cultures which were NGTD at 4 weeks though she had recently been on biaxin. She returns to today and still with cough, malaise but now weight loss.  Past Medical History  Diagnosis Date  . Hypertension   . COPD (chronic obstructive pulmonary disease) ([facility])   . Anxiety   . Shortness of breath   . Pneumonia 04/2013  . Headache(784.0)   . Arthritis   . MAI (mycobacterium avium-intracellulare) infection ([facility]) 2014  . Bronchiectasis ([facility]) 2014  . IBS (irritable bowel syndrome)   . Gout   . DVT (deep venous thrombosis) ([facility]) 2008    LLE  . Mold exposure 06/20/2015    Past Surgical History  Procedure Laterality Date  . Abdominal hysterectomy    . Cholecystectomy    . Fracture surgery    . Cataract extraction  Family History  Problem Relation Age of Onset  . Diabetes Father   . Hypertension Father       Social History   Social History  . Marital Status: Married    Spouse Name: N/A  . Number of Children: N/A  . Years of Education: N/A   Social History Main Topics  . Smoking status: Never Smoker   . Smokeless tobacco: Never Used  . Alcohol Use: No  . Drug Use: No  . Sexual Activity: Not Asked   Other Topics Concern  . None   Social History Narrative    Allergies  Allergen Reactions  . Amoxicillin Anaphylaxis and Rash  . Penicillins Anaphylaxis and Rash  . Sulfa Antibiotics Anaphylaxis and Rash  .  Darvocet [Propoxyphene N-Acetaminophen]     hallucinations     Current outpatient prescriptions:  .  albuterol (PROVENTIL) (2.5 MG/3ML) 0.083% nebulizer solution, Take 3 mLs (2.5 mg total) by nebulization every 6 (six) hours as needed for wheezing or shortness of breath., Disp: 75 mL, Rfl: 12 .  allopurinol (ZYLOPRIM) 300 MG tablet, Take 300 mg by mouth daily., Disp: , Rfl:  .  aspirin EC 81 MG tablet, Take 81 mg by mouth daily., Disp: , Rfl:  .  atenolol (TENORMIN) 50 MG tablet, Take 50 mg by mouth daily., Disp: , Rfl:  .  beta carotene w/minerals (OCUVITE) tablet, Take 1 tablet by mouth daily., Disp: , Rfl:  .  brimonidine-timolol (COMBIGAN) 0.2-0.5 % ophthalmic solution, Place 1 drop into both eyes every 12 (twelve) hours., Disp: , Rfl:  .  dorzolamide (TRUSOPT) 2 % ophthalmic solution, Place 1 drop into both eyes 2 (two) times daily., Disp: , Rfl:  .  guaiFENesin-codeine 100-10 MG/5ML syrup, Take 10 mLs by mouth every 6 (six) hours as needed for cough., Disp: 120 mL, Rfl: 0 .  hyoscyamine (LEVBID) 0.375 MG 12 hr tablet, Take 0.1875 mg by mouth every morning. , Disp: , Rfl:  .  latanoprost (XALATAN) 0.005 % ophthalmic solution, Place 1 drop into both eyes at bedtime., Disp: , Rfl:  .  PARoxetine (PAXIL) 20 MG tablet, Take 20 mg by mouth every morning., Disp: , Rfl:  .  triamterene-hydrochlorothiazide (MAXZIDE-25) 37.5-25 MG per tablet, Take 1 tablet by mouth every other day., Disp: , Rfl:      Review of Systems  Constitutional: Positive for appetite change and fatigue. Negative for fever, chills, diaphoresis, activity change and unexpected weight change.  HENT: Negative for congestion, rhinorrhea, sinus pressure, sneezing, sore throat and trouble swallowing.   Eyes: Negative for photophobia and visual disturbance.  Respiratory: Positive for cough. Negative for chest tightness, shortness of breath, wheezing and stridor.   Cardiovascular: Negative for chest pain, palpitations and leg  swelling.  Gastrointestinal: Negative for vomiting, abdominal pain, diarrhea, constipation, blood in stool, abdominal distention and anal bleeding.  Genitourinary: Negative for dysuria, hematuria, flank pain and difficulty urinating.  Musculoskeletal: Negative for myalgias, back pain, joint swelling, arthralgias and gait problem.  Skin: Negative for color change, pallor and rash.  Neurological: Negative for dizziness, tremors, weakness and light-headedness.  Hematological: Negative for adenopathy. Does not bruise/bleed easily.  Psychiatric/Behavioral: Negative for behavioral problems, confusion, sleep disturbance, dysphoric mood, decreased concentration and agitation.       Objective:   Physical Exam  Constitutional: She is oriented to person, place, and time. No distress.  HENT:  Head: Normocephalic and atraumatic.  Mouth/Throat: Oropharynx is clear and moist. No oropharyngeal exudate.  Eyes: Conjunctivae and EOM are normal.  Pupils are equal, round, and reactive to light. No scleral icterus.  Neck: Normal range of motion. Neck supple. No JVD present.  Cardiovascular: Normal rate, regular rhythm and normal heart sounds.  Exam reveals no gallop and no friction rub.   No murmur heard. Pulmonary/Chest: Effort normal. No respiratory distress. She has decreased breath sounds in the right lower field and the left lower field. She has wheezes. She has rhonchi in the right lower field and the left lower field. She has rales. She exhibits no tenderness.  Abdominal: She exhibits no distension and no mass. There is no tenderness. There is no rebound and no guarding.  Musculoskeletal: She exhibits no edema or tenderness.  Lymphadenopathy:    She has no cervical adenopathy.  Neurological: She is alert and oriented to person, place, and time. She has normal reflexes. She exhibits normal muscle tone. Coordination normal.  Skin: Skin is warm and dry. She is not diaphoretic. There is erythema. No pallor.    Psychiatric: She has a normal mood and affect. Her behavior is normal.          Assessment & Plan:   #1 Mavium intracellulare with bronchiectasis : II am supplying her with a 2ND specimen to collect early am culture for AFB.   If we can grow her M avium and then send for susceptibility testing we can have a rationale approach to her therapy and hopefully  If she grows it now but does not grow it from first specimen collection would be argument that it is probably macrolide S  #2  Scedosporium species: likely a colonizer.  #3 Prior cellulitis: not active  I spent greater than 25 minutes with the patient including greater than 50% of time in face to face counsel of the patient and her husband re her M avium infection, bronchiectasis and in coordination of their care.

## 2015-07-30 ENCOUNTER — Other Ambulatory Visit: Payer: Medicare HMO

## 2015-07-30 DIAGNOSIS — A310 Pulmonary mycobacterial infection: Secondary | ICD-10-CM

## 2015-07-30 DIAGNOSIS — J471 Bronchiectasis with (acute) exacerbation: Secondary | ICD-10-CM

## 2015-07-30 NOTE — Addendum Note (Signed)
Addended by: Andree CossHOWELL, Thayer Embleton M on: 07/30/2015 05:26 PM   Modules accepted: Orders

## 2015-07-30 NOTE — Addendum Note (Signed)
Addended by: Mariea ClontsGREEN, Jadarious Dobbins D on: 07/30/2015 05:36 PM   Modules accepted: Orders

## 2015-07-31 LAB — AFB CULTURE WITH SMEAR (NOT AT ARMC): ACID FAST SMEAR: NONE SEEN

## 2015-08-01 ENCOUNTER — Telehealth: Payer: Self-pay | Admitting: *Deleted

## 2015-08-01 NOTE — Telephone Encounter (Signed)
This is a M avium infection pt correct? She should have followup soon

## 2015-08-01 NOTE — Telephone Encounter (Signed)
[facility] called to advise that the patient had a +AFB culture. They will have to send it out to identify the organism. They wanted to let Dr Daiva EvesVan Dam know. Advised I will send him a message.

## 2015-08-02 NOTE — Telephone Encounter (Signed)
Patient is set to see you 09/24/15 is that soon enough?

## 2015-08-13 ENCOUNTER — Ambulatory Visit (HOSPITAL_COMMUNITY)
Admission: RE | Admit: 2015-08-13 | Discharge: 2015-08-13 | Disposition: A | Discharge status: Home / Self Care | Payer: Medicare HMO | Source: Ambulatory Visit | Attending: Internal Medicine | Admitting: Internal Medicine

## 2015-08-13 ENCOUNTER — Other Ambulatory Visit (HOSPITAL_COMMUNITY): Payer: Self-pay | Admitting: Internal Medicine

## 2015-08-13 DIAGNOSIS — J479 Bronchiectasis, uncomplicated: Secondary | ICD-10-CM | POA: Insufficient documentation

## 2015-08-13 DIAGNOSIS — R0602 Shortness of breath: Secondary | ICD-10-CM | POA: Diagnosis not present

## 2015-08-13 DIAGNOSIS — J9811 Atelectasis: Secondary | ICD-10-CM | POA: Diagnosis not present

## 2015-08-13 DIAGNOSIS — J984 Other disorders of lung: Secondary | ICD-10-CM | POA: Diagnosis not present

## 2015-08-13 DIAGNOSIS — R079 Chest pain, unspecified: Secondary | ICD-10-CM

## 2015-09-10 ENCOUNTER — Emergency Department (HOSPITAL_COMMUNITY)
Admission: EM | Admit: 2015-09-10 | Discharge: 2015-09-10 | Disposition: A | Discharge status: Home / Self Care | Payer: Medicare HMO | Attending: Emergency Medicine | Admitting: Emergency Medicine

## 2015-09-10 ENCOUNTER — Emergency Department (HOSPITAL_COMMUNITY): Payer: Medicare HMO

## 2015-09-10 ENCOUNTER — Encounter (HOSPITAL_COMMUNITY): Payer: Self-pay

## 2015-09-10 DIAGNOSIS — K589 Irritable bowel syndrome without diarrhea: Secondary | ICD-10-CM | POA: Diagnosis not present

## 2015-09-10 DIAGNOSIS — Z7982 Long term (current) use of aspirin: Secondary | ICD-10-CM | POA: Diagnosis not present

## 2015-09-10 DIAGNOSIS — I10 Essential (primary) hypertension: Secondary | ICD-10-CM | POA: Insufficient documentation

## 2015-09-10 DIAGNOSIS — M1990 Unspecified osteoarthritis, unspecified site: Secondary | ICD-10-CM | POA: Diagnosis not present

## 2015-09-10 DIAGNOSIS — Z880 Allergy status to penicillin: Secondary | ICD-10-CM | POA: Insufficient documentation

## 2015-09-10 DIAGNOSIS — J441 Chronic obstructive pulmonary disease with (acute) exacerbation: Secondary | ICD-10-CM | POA: Diagnosis not present

## 2015-09-10 DIAGNOSIS — Z86718 Personal history of other venous thrombosis and embolism: Secondary | ICD-10-CM | POA: Insufficient documentation

## 2015-09-10 DIAGNOSIS — F419 Anxiety disorder, unspecified: Secondary | ICD-10-CM | POA: Diagnosis not present

## 2015-09-10 DIAGNOSIS — Z79899 Other long term (current) drug therapy: Secondary | ICD-10-CM | POA: Insufficient documentation

## 2015-09-10 DIAGNOSIS — Z8619 Personal history of other infectious and parasitic diseases: Secondary | ICD-10-CM | POA: Diagnosis not present

## 2015-09-10 DIAGNOSIS — M109 Gout, unspecified: Secondary | ICD-10-CM | POA: Insufficient documentation

## 2015-09-10 DIAGNOSIS — Z8701 Personal history of pneumonia (recurrent): Secondary | ICD-10-CM | POA: Insufficient documentation

## 2015-09-10 DIAGNOSIS — R05 Cough: Secondary | ICD-10-CM | POA: Diagnosis present

## 2015-09-10 LAB — CBC WITH DIFFERENTIAL/PLATELET
Basophils Absolute: 0 10*3/uL (ref 0.0–0.1)
Basophils Relative: 0 %
Eosinophils Absolute: 0.3 10*3/uL (ref 0.0–0.7)
Eosinophils Relative: 3 %
HCT: 39.6 % (ref 36.0–46.0)
Hemoglobin: 12.9 g/dL (ref 12.0–15.0)
Lymphocytes Relative: 13 %
Lymphs Abs: 1.3 10*3/uL (ref 0.7–4.0)
MCH: 32.3 pg (ref 26.0–34.0)
MCHC: 32.6 g/dL (ref 30.0–36.0)
MCV: 99.2 fL (ref 78.0–100.0)
Monocytes Absolute: 1.2 10*3/uL — ABNORMAL HIGH (ref 0.1–1.0)
Monocytes Relative: 12 %
Neutro Abs: 7.4 10*3/uL (ref 1.7–7.7)
Neutrophils Relative %: 72 %
Platelets: 195 10*3/uL (ref 150–400)
RBC: 3.99 MIL/uL (ref 3.87–5.11)
RDW: 14.1 % (ref 11.5–15.5)
WBC: 10.1 10*3/uL (ref 4.0–10.5)

## 2015-09-10 LAB — BASIC METABOLIC PANEL
Anion gap: 8 (ref 5–15)
BUN: 22 mg/dL — ABNORMAL HIGH (ref 6–20)
CO2: 29 mmol/L (ref 22–32)
Calcium: 8.6 mg/dL — ABNORMAL LOW (ref 8.9–10.3)
Chloride: 99 mmol/L — ABNORMAL LOW (ref 101–111)
Creatinine, Ser: 1.07 mg/dL — ABNORMAL HIGH (ref 0.44–1.00)
GFR calc Af Amer: 53 mL/min — ABNORMAL LOW (ref >60)
GFR calc non Af Amer: 46 mL/min — ABNORMAL LOW (ref >60)
Glucose, Bld: 114 mg/dL — ABNORMAL HIGH (ref 65–99)
Potassium: 3.5 mmol/L (ref 3.5–5.1)
Sodium: 136 mmol/L (ref 135–145)

## 2015-09-10 MED ORDER — PREDNISONE 20 MG PO TABS: 40.0000 mg | ORAL_TABLET | Freq: Every day | ORAL | Status: DC

## 2015-09-10 MED ORDER — LEVOFLOXACIN 500 MG PO TABS
500.0000 mg | ORAL_TABLET | Freq: Once | ORAL | Status: AC
  Administered 2015-09-10: 500 mg via ORAL
  Filled 2015-09-10: qty 1

## 2015-09-10 MED ORDER — LEVOFLOXACIN 500 MG PO TABS: 500.0000 mg | ORAL_TABLET | Freq: Every day | ORAL | Status: DC

## 2015-09-10 MED ORDER — ALBUTEROL SULFATE (2.5 MG/3ML) 0.083% IN NEBU
2.5000 mg | INHALATION_SOLUTION | Freq: Once | RESPIRATORY_TRACT | Status: AC
  Administered 2015-09-10: 2.5 mg via RESPIRATORY_TRACT
  Filled 2015-09-10: qty 3

## 2015-09-10 MED ORDER — PREDNISONE 20 MG PO TABS
40.0000 mg | ORAL_TABLET | Freq: Once | ORAL | Status: AC
  Administered 2015-09-10: 40 mg via ORAL
  Filled 2015-09-10: qty 2

## 2015-09-10 NOTE — ED Notes (Signed)
In room with Dr. Juleen ChinaKohut.  sats ranging 89-93% on room air.  Pt no visible respiratory distress.  Ok to d/c patient.

## 2015-09-10 NOTE — Discharge Instructions (Signed)

## 2015-09-10 NOTE — ED Notes (Signed)
Productive cough worsening over the past several days. Pt states she is no more sob than what is normal for her.  Pt denies cp

## 2015-09-10 NOTE — ED Provider Notes (Signed)
CSN: [id number]     Arrival date & time 09/10/15  0865 History   First MD Initiated Contact with Patient 09/10/15 [phone]     Chief Complaint  Patient presents with  . Cough     (Consider location/radiation/quality/duration/timing/severity/associated sxs/prior Treatment) HPI   81 year old female with cough. Progressively worsening over the last 3-4 days. Patient has a past history COPD. She has baseline dyspnea. She denies any acute change in this. Cough is productive. No fevers or chills. Initially pain or swelling. She occasionally has a past history of infection with Mycobacterium avium-intacellulare. Per review of records, her last two sputum cultures were negative.   Past Medical History  Diagnosis Date  . Hypertension   . COPD (chronic obstructive pulmonary disease) (HCC)   . Anxiety   . Shortness of breath   . Pneumonia 04/2013  . Headache(784.0)   . Arthritis   . MAI (mycobacterium avium-intracellulare) infection (HCC) 2014  . Bronchiectasis (HCC) 2014  . IBS (irritable bowel syndrome)   . Gout   . DVT (deep venous thrombosis) (HCC) 2008    LLE  . Mold exposure 06/20/2015   Past Surgical History  Procedure Laterality Date  . Abdominal hysterectomy    . Cholecystectomy    . Fracture surgery    . Cataract extraction     Family History  Problem Relation Age of Onset  . Diabetes Father   . Hypertension Father    Social History  Substance Use Topics  . Smoking status: Never Smoker   . Smokeless tobacco: Never Used  . Alcohol Use: No   OB History    No data available     Review of Systems  All systems reviewed and negative, other than as noted in HPI.   Allergies  Amoxicillin; Penicillins; Sulfa antibiotics; and Darvocet  Home Medications   Prior to Admission medications   Medication Sig Start Date End Date Taking? Authorizing Provider  albuterol (PROVENTIL) (2.5 MG/3ML) 0.083% nebulizer solution Take 3 mLs (2.5 mg total) by nebulization every 6 (six)  hours as needed for wheezing or shortness of breath. 01/19/15  Yes Kari Baars, MD  allopurinol (ZYLOPRIM) 300 MG tablet Take 300 mg by mouth daily.   Yes Historical Provider, MD  aspirin EC 81 MG tablet Take 81 mg by mouth daily.   Yes Historical Provider, MD  atenolol (TENORMIN) 50 MG tablet Take 50 mg by mouth daily.   Yes Historical Provider, MD  beta carotene w/minerals (OCUVITE) tablet Take 1 tablet by mouth daily.   Yes Historical Provider, MD  brimonidine-timolol (COMBIGAN) 0.2-0.5 % ophthalmic solution Place 1 drop into both eyes every 12 (twelve) hours.   Yes Historical Provider, MD  dorzolamide (TRUSOPT) 2 % ophthalmic solution Place 1 drop into both eyes 2 (two) times daily.   Yes Historical Provider, MD  guaiFENesin-codeine 100-10 MG/5ML syrup Take 10 mLs by mouth every 6 (six) hours as needed for cough. 01/19/15  Yes Kari Baars, MD  hyoscyamine (LEVBID) 0.375 MG 12 hr tablet Take 0.1875 mg by mouth every morning.    Yes Historical Provider, MD  latanoprost (XALATAN) 0.005 % ophthalmic solution Place 1 drop into both eyes at bedtime.   Yes Historical Provider, MD  PARoxetine (PAXIL) 20 MG tablet Take 20 mg by mouth every morning.   Yes Historical Provider, MD  triamterene-hydrochlorothiazide (MAXZIDE-25) 37.5-25 MG per tablet Take 1 tablet by mouth every other day.   Yes Historical Provider, MD   BP 133/59 mmHg  Pulse 80  Temp(Src)  98.3 F (36.8 C) (Oral)  Resp 18  Ht 5\' 8"  (1.727 m)  Wt 160 lb (72.576 kg)  BMI 24.33 kg/m2  SpO2 86% Physical Exam  Constitutional: She appears well-developed and well-nourished. No distress.  HENT:  Head: Normocephalic and atraumatic.  Eyes: Conjunctivae are normal. Right eye exhibits no discharge. Left eye exhibits no discharge.  Neck: Neck supple.  Cardiovascular: Normal rate, regular rhythm and normal heart sounds.  Exam reveals no gallop and no friction rub.   No murmur heard. Pulmonary/Chest: Effort normal. No respiratory distress. She  has wheezes.  Abdominal: Soft. She exhibits no distension. There is no tenderness.  Musculoskeletal: She exhibits no edema or tenderness.  Lower extremities symmetric as compared to each other. No calf tenderness. Negative Homan's. No palpable cords.   Neurological: She is alert.  Skin: Skin is warm and dry.  Psychiatric: She has a normal mood and affect. Her behavior is normal. Thought content normal.  Nursing note and vitals reviewed.   ED Course  Procedures (including critical care time) Labs Review Labs Reviewed  CBC WITH DIFFERENTIAL/PLATELET - Abnormal; Notable for the following:    Monocytes Absolute 1.2 (*)    All other components within normal limits  BASIC METABOLIC PANEL - Abnormal; Notable for the following:    Chloride 99 (*)    Glucose, Bld 114 (*)    BUN 22 (*)    Creatinine, Ser 1.07 (*)    Calcium 8.6 (*)    GFR calc non Af Amer 46 (*)    GFR calc Af Amer 53 (*)    All other components within normal limits    Imaging Review Dg Chest 2 View  09/10/2015  CLINICAL DATA:  Productive cough EXAM: CHEST  2 VIEW COMPARISON:  08/13/2015 and 01/15/2015 FINDINGS: Again noted hyperinflation. Stable reticular nodular opacities right lower lobe in lingula with bronchiectasis and scarring. Again noted bandlike scarring superior segment of right lower lobe. No definite superimposed infiltrate or pulmonary edema. Mild thoracic spine osteopenia. IMPRESSION: Again noted hyperinflation and chronic lung disease with chronic reticular nodular interstitial prominence and bronchiectasis especially in right lower lobe and lingula. No definite superimposed infiltrate or pulmonary edema Electronically Signed   By: Natasha MeadLiviu  Pop M.D.   On: 09/10/2015 08:33   I have personally reviewed and evaluated these images and lab results as part of my medical decision-making.   EKG Interpretation None      MDM   Final diagnoses:  COPD exacerbation (HCC)    81 year old female with increasing  cough. Clinically suspect COPD exacerbation. She is afebrile and does not have a leukocytosis, but with increased cough which is productive, will give course of abx. Steroids. She denies acute dyspnea and is only really complaining of the cough. She does not appeared distressed. I feel she is appropriate for further outpt tx.    Raeford RazorStephen Justice Aguirre, MD 09/16/15 [phone]

## 2015-09-10 NOTE — ED Notes (Signed)
Room air sat 86% while coughing.  When pt not coughing sats 93%.  Pt states she feel like she is breathing better,  And not coughing as much.

## 2015-09-13 ENCOUNTER — Telehealth: Payer: Self-pay | Admitting: *Deleted

## 2015-09-13 NOTE — Telephone Encounter (Signed)
[facility] [place] [facility] to report a positive mycobacterial culture. Says most closely related to M. Coloregonium. MD notified and result placed in Dr. Zenaida NieceVan Dam's inbox. Wendall MolaJacqueline Cockerham

## 2015-09-13 NOTE — Telephone Encounter (Signed)
[facility] notified

## 2015-09-13 NOTE — Telephone Encounter (Signed)
Ok. It should be sent for sensitivities as well, if not already done.  thanks

## 2015-09-19 ENCOUNTER — Telehealth: Payer: Self-pay | Admitting: Infectious Disease

## 2015-09-19 NOTE — Telephone Encounter (Signed)
Patient growing MYCOBACTERIUM COLOREGONIUM  Which is likely just normal flora of upper respiratory tract

## 2015-09-20 LAB — CP MYCOBACTERIAL IDENTIFICATION

## 2015-09-24 ENCOUNTER — Ambulatory Visit: Payer: Self-pay | Admitting: Infectious Disease

## 2015-10-03 ENCOUNTER — Encounter: Payer: Self-pay | Admitting: Infectious Disease

## 2015-10-03 ENCOUNTER — Ambulatory Visit (INDEPENDENT_AMBULATORY_CARE_PROVIDER_SITE_OTHER): Payer: Medicare HMO | Admitting: Infectious Disease

## 2015-10-03 VITALS — BP 162/79 | HR 89 | Temp 98.0°F | Wt 156.2 lb

## 2015-10-03 DIAGNOSIS — A310 Pulmonary mycobacterial infection: Secondary | ICD-10-CM

## 2015-10-03 DIAGNOSIS — J441 Chronic obstructive pulmonary disease with (acute) exacerbation: Secondary | ICD-10-CM | POA: Diagnosis not present

## 2015-10-03 DIAGNOSIS — J471 Bronchiectasis with (acute) exacerbation: Secondary | ICD-10-CM

## 2015-10-03 DIAGNOSIS — Z77120 Contact with and (suspected) exposure to mold (toxic): Secondary | ICD-10-CM

## 2015-10-03 DIAGNOSIS — G309 Alzheimer's disease, unspecified: Secondary | ICD-10-CM | POA: Diagnosis not present

## 2015-10-03 DIAGNOSIS — F0280 Dementia in other diseases classified elsewhere without behavioral disturbance: Secondary | ICD-10-CM

## 2015-10-03 MED ORDER — ETHAMBUTOL HCL 400 MG PO TABS: 1600.0000 mg | ORAL_TABLET | ORAL | Status: DC

## 2015-10-03 MED ORDER — AZITHROMYCIN 500 MG PO TABS: 500.0000 mg | ORAL_TABLET | ORAL | Status: DC

## 2015-10-03 MED ORDER — RIFAMPIN 300 MG PO CAPS: 600.0000 mg | ORAL_CAPSULE | ORAL | Status: DC

## 2015-10-03 NOTE — Progress Notes (Signed)
[facility]   Chief complaint: cough, hoarse throat, failure to thrive    Subjective:    Patient ID: Stacie Wright, female    DOB: 16-Jan-1928, 82 y.o.   MRN: [id number]  HPI   82 y.o. female with  COPD but not a smoker and claims to not have had asthma. We saw her in the hospital for HCAP workup and she had CT showing:  " multifocal infection, worse  within the lingula and right middle lobe, with associated  consolidative opacities and areas of bronchiectasis - as such, atypical etiologies (such as MAC) should be considered. And  Extensive basilar predominant tree in bud opacities are  worrisome for airways disseminated infection and/or aspiration"   WE obtained sputum for AFB, bacterial cx adn Fungal cx. Resp cultures were with OPF. Her B. 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 She finished a course of levaquin for her pna with bronchiectasis type duration (21days).    Along the way she injured her left lower extremity where she has hardware in place and had a draining area.. She was seen by Dr. Eulah Pont who apparently applied a type of "glue" to this area. There was still substantial erythema which has progressed despite her continued use of levofloxacin when I saw her 2 visits ago (> 2 years ago)  I changed her to doxycyline and got MRI of the leg which showed:  IMPRESSION:  1. Distal fibula lateral plate and screw fixation. Artifact obscures  the distal lateral leg and portions of the ankle.  2. Left leg swelling and subcutaneous edema which may represent  dependent edema or cellulitis in the appropriate clinical setting.  There is no abscess. No visible osteomyelitis is present.  3. Because of the fixation hardware, CT without infusion may be  useful to assess for changes of chronic osteomyelitis.   We considered treating her  Mycobacterium avium infection but  because AT THAT TIME  she denied weight loss or fevers she does have a nightly cough although is not as productive as it has been in the past, and she was without rx drug plan we opted not to start her. I then did not see her for next 2 years. In the interim she apparently has been treated for M avium with biaxin MONOTHERAPY, which I do not understand since this seems like a sure fire way to risk macrolide resistance.  When I then saw her subsequently   she felt poorly with poor appetite, malaise and cough throughout the day. We stopped her biaxin and obtained AFB sputum cultures which were NGTD at 4 weeks though she had recently been on biaxin. She did not grow M avium this time but MYCOBACTERIUM COLOREGONIUM . The November culture does not appear to have ever been finalized and released--I will call to confirm if something grew or not.  Since I last saw her she was admitted and treated for COPD exacerbation, possible CHF with steroids, diurtics. I am not sure of whether or not she received antibiotics   Past Medical History  Diagnosis Date  . Hypertension   . COPD (chronic obstructive pulmonary disease) (HCC)   . Anxiety   . Shortness of breath   . Pneumonia 04/2013  . Headache(784.0)   . Arthritis   . MAI (mycobacterium avium-intracellulare) infection (HCC) 2014  . Bronchiectasis (HCC) 2014  . IBS (irritable bowel syndrome)   . Gout   .  DVT (deep venous thrombosis) (HCC) 2008    LLE  . Mold exposure 06/20/2015    Past Surgical History  Procedure Laterality Date  . Abdominal hysterectomy    . Cholecystectomy    . Fracture surgery    . Cataract extraction      Family History  Problem Relation Age of Onset  . Diabetes Father   . Hypertension Father       Social History   Social History  . Marital Status: Married    Spouse Name: N/A  . Number of Children: N/A  . Years of Education: N/A   Social History Main Topics  . Smoking status: Never Smoker   . Smokeless tobacco:  Never Used  . Alcohol Use: No  . Drug Use: No  . Sexual Activity: Not Asked   Other Topics Concern  . None   Social History Narrative    Allergies  Allergen Reactions  . Amoxicillin Anaphylaxis and Rash  . Penicillins Anaphylaxis and Rash    Has patient had a PCN reaction causing immediate rash, facial/tongue/throat swelling, SOB or lightheadedness with hypotension: Yes Has patient had a PCN reaction causing severe rash involving mucus membranes or skin necrosis: No Has patient had a PCN reaction that required hospitalization No Has patient had a PCN reaction occurring within the last 10 years: No If all of the above answers are "NO", then may proceed with Cephalosporin use.   . Sulfa Antibiotics Anaphylaxis and Rash  . Darvocet [Propoxyphene N-Acetaminophen]     hallucinations     Current outpatient prescriptions:  .  albuterol (PROVENTIL) (2.5 MG/3ML) 0.083% nebulizer solution, Take 3 mLs (2.5 mg total) by nebulization every 6 (six) hours as needed for wheezing or shortness of breath., Disp: 75 mL, Rfl: 12 .  allopurinol (ZYLOPRIM) 300 MG tablet, Take 300 mg by mouth daily., Disp: , Rfl:  .  aspirin EC 81 MG tablet, Take 81 mg by mouth daily., Disp: , Rfl:  .  atenolol (TENORMIN) 50 MG tablet, Take 50 mg by mouth daily., Disp: , Rfl:  .  azithromycin (ZITHROMAX) 500 MG tablet, Take 1 tablet (500 mg total) by mouth 3 (three) times a week., Disp: 12 tablet, Rfl: 11 .  beta carotene w/minerals (OCUVITE) tablet, Take 1 tablet by mouth daily., Disp: , Rfl:  .  brimonidine-timolol (COMBIGAN) 0.2-0.5 % ophthalmic solution, Place 1 drop into both eyes every 12 (twelve) hours., Disp: , Rfl:  .  dorzolamide (TRUSOPT) 2 % ophthalmic solution, Place 1 drop into both eyes 2 (two) times daily., Disp: , Rfl:  .  ethambutol (MYAMBUTOL) 400 MG tablet, Take 4 tablets (1,600 mg total) by mouth 3 (three) times a week., Disp: 48 tablet, Rfl: 11 .  guaiFENesin-codeine 100-10 MG/5ML syrup, Take 10  mLs by mouth every 6 (six) hours as needed for cough., Disp: 120 mL, Rfl: 0 .  hyoscyamine (LEVBID) 0.375 MG 12 hr tablet, Take 0.1875 mg by mouth every morning. , Disp: , Rfl:  .  latanoprost (XALATAN) 0.005 % ophthalmic solution, Place 1 drop into both eyes at bedtime., Disp: , Rfl:  .  levofloxacin (LEVAQUIN) 500 MG tablet, Take 1 tablet (500 mg total) by mouth daily., Disp: 6 tablet, Rfl: 0 .  PARoxetine (PAXIL) 20 MG tablet, Take 20 mg by mouth every morning., Disp: , Rfl:  .  predniSONE (DELTASONE) 20 MG tablet, Take 2 tablets (40 mg total) by mouth daily., Disp: 10 tablet, Rfl: 0 .  rifampin (RIFADIN) 300 MG capsule, Take  2 capsules (600 mg total) by mouth 3 (three) times a week., Disp: 30 capsule, Rfl: 11 .  triamterene-hydrochlorothiazide (MAXZIDE-25) 37.5-25 MG per tablet, Take 1 tablet by mouth every other day., Disp: , Rfl:      Review of Systems  Constitutional: Positive for appetite change and fatigue. Negative for fever, chills, diaphoresis, activity change and unexpected weight change.  HENT: Positive for sore throat. Negative for congestion, rhinorrhea, sinus pressure, sneezing and trouble swallowing.   Eyes: Negative for photophobia and visual disturbance.  Respiratory: Positive for cough. Negative for chest tightness, shortness of breath, wheezing and stridor.   Cardiovascular: Negative for chest pain, palpitations and leg swelling.  Gastrointestinal: Negative for vomiting, abdominal pain, diarrhea, constipation, blood in stool, abdominal distention and anal bleeding.  Genitourinary: Negative for dysuria, hematuria, flank pain and difficulty urinating.  Musculoskeletal: Negative for myalgias, back pain, joint swelling, arthralgias and gait problem.  Skin: Negative for color change, pallor and rash.  Neurological: Negative for dizziness, tremors, weakness and light-headedness.  Hematological: Negative for adenopathy. Does not bruise/bleed easily.  Psychiatric/Behavioral:  Negative for behavioral problems, confusion, sleep disturbance, dysphoric mood, decreased concentration and agitation.       Objective:   Physical Exam  Constitutional: She is oriented to person, place, and time. No distress.  HENT:  Head: Normocephalic and atraumatic.  Mouth/Throat: Oropharynx is clear and moist. No oropharyngeal exudate.  Eyes: Conjunctivae and EOM are normal. Pupils are equal, round, and reactive to light. No scleral icterus.  Neck: Normal range of motion. Neck supple. No JVD present.  Cardiovascular: Normal rate, regular rhythm and normal heart sounds.  Exam reveals no gallop and no friction rub.   No murmur heard. Pulmonary/Chest: Effort normal. No respiratory distress. She has decreased breath sounds in the right lower field and the left lower field. She has rhonchi in the right lower field and the left lower field. She has rales. She exhibits no tenderness.  Abdominal: She exhibits no distension and no mass. There is no tenderness. There is no rebound and no guarding.  Musculoskeletal: She exhibits no edema or tenderness.  Lymphadenopathy:    She has no cervical adenopathy.  Neurological: She is alert and oriented to person, place, and time. She has normal reflexes. She exhibits normal muscle tone. Coordination normal.  Skin: Skin is warm and dry. She is not diaphoretic. There is erythema. No pallor.  Psychiatric: She has a normal mood and affect. She is slowed. She is not agitated. Cognition and memory are impaired.          Assessment & Plan:   #1 Mavium intracellulare with bronchiectasis : I will check on the cultures from November again. 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  #3 COPD: patient still suffering from this a great deal  #4 Dementia: I would encourage goals of care discussion given her age, dementia and multiple  comorbidities  I spent greater than 25 minutes with the patient including greater than 50% of time in face to face counsel of the patient and her husband re her M avium infection, bronchiectasis and in coordination of her care.

## 2015-10-10 ENCOUNTER — Emergency Department (HOSPITAL_COMMUNITY): Payer: Medicare HMO

## 2015-10-10 ENCOUNTER — Inpatient Hospital Stay (HOSPITAL_COMMUNITY)
Admission: EM | Admit: 2015-10-10 | Discharge: 2015-10-12 | DRG: 177 | Disposition: A | Discharge status: Home / Self Care | Payer: Medicare HMO | Attending: Internal Medicine | Admitting: Internal Medicine

## 2015-10-10 ENCOUNTER — Encounter (HOSPITAL_COMMUNITY): Payer: Self-pay | Admitting: Emergency Medicine

## 2015-10-10 DIAGNOSIS — J441 Chronic obstructive pulmonary disease with (acute) exacerbation: Secondary | ICD-10-CM | POA: Diagnosis present

## 2015-10-10 DIAGNOSIS — J9601 Acute respiratory failure with hypoxia: Secondary | ICD-10-CM | POA: Diagnosis present

## 2015-10-10 DIAGNOSIS — J471 Bronchiectasis with (acute) exacerbation: Secondary | ICD-10-CM | POA: Diagnosis present

## 2015-10-10 DIAGNOSIS — I10 Essential (primary) hypertension: Secondary | ICD-10-CM | POA: Diagnosis present

## 2015-10-10 DIAGNOSIS — A310 Pulmonary mycobacterial infection: Secondary | ICD-10-CM | POA: Diagnosis not present

## 2015-10-10 DIAGNOSIS — G309 Alzheimer's disease, unspecified: Secondary | ICD-10-CM | POA: Diagnosis present

## 2015-10-10 DIAGNOSIS — R0602 Shortness of breath: Secondary | ICD-10-CM | POA: Diagnosis not present

## 2015-10-10 DIAGNOSIS — E876 Hypokalemia: Secondary | ICD-10-CM | POA: Diagnosis present

## 2015-10-10 DIAGNOSIS — F0280 Dementia in other diseases classified elsewhere without behavioral disturbance: Secondary | ICD-10-CM | POA: Diagnosis present

## 2015-10-10 DIAGNOSIS — Z8249 Family history of ischemic heart disease and other diseases of the circulatory system: Secondary | ICD-10-CM

## 2015-10-10 DIAGNOSIS — J479 Bronchiectasis, uncomplicated: Secondary | ICD-10-CM

## 2015-10-10 DIAGNOSIS — Z833 Family history of diabetes mellitus: Secondary | ICD-10-CM

## 2015-10-10 LAB — CBC WITH DIFFERENTIAL/PLATELET
BASOS ABS: 0 10*3/uL (ref 0.0–0.1)
Basophils Relative: 0 %
EOS ABS: 0.3 10*3/uL (ref 0.0–0.7)
Eosinophils Relative: 2 %
HCT: 40.7 % (ref 36.0–46.0)
HEMOGLOBIN: 13.5 g/dL (ref 12.0–15.0)
LYMPHS ABS: 1.8 10*3/uL (ref 0.7–4.0)
LYMPHS PCT: 11 %
MCH: 32.4 pg (ref 26.0–34.0)
MCHC: 33.2 g/dL (ref 30.0–36.0)
MCV: 97.6 fL (ref 78.0–100.0)
Monocytes Absolute: 1.7 10*3/uL — ABNORMAL HIGH (ref 0.1–1.0)
Monocytes Relative: 10 %
NEUTROS PCT: 77 %
Neutro Abs: 13.2 10*3/uL — ABNORMAL HIGH (ref 1.7–7.7)
PLATELETS: 257 10*3/uL (ref 150–400)
RBC: 4.17 MIL/uL (ref 3.87–5.11)
RDW: 13.5 % (ref 11.5–15.5)
WBC: 17 10*3/uL — AB (ref 4.0–10.5)

## 2015-10-10 LAB — COMPREHENSIVE METABOLIC PANEL
ALK PHOS: 77 U/L (ref 38–126)
ALT: 16 U/L (ref 14–54)
AST: 26 U/L (ref 15–41)
Albumin: 3.6 g/dL (ref 3.5–5.0)
Anion gap: 14 (ref 5–15)
BUN: 28 mg/dL — AB (ref 6–20)
CALCIUM: 9.1 mg/dL (ref 8.9–10.3)
CHLORIDE: 93 mmol/L — AB (ref 101–111)
CO2: 29 mmol/L (ref 22–32)
CREATININE: 1.26 mg/dL — AB (ref 0.44–1.00)
GFR calc non Af Amer: 38 mL/min — ABNORMAL LOW (ref >60)
GFR, EST AFRICAN AMERICAN: 44 mL/min — AB (ref >60)
Glucose, Bld: 130 mg/dL — ABNORMAL HIGH (ref 65–99)
Potassium: 3.2 mmol/L — ABNORMAL LOW (ref 3.5–5.1)
SODIUM: 136 mmol/L (ref 135–145)
Total Bilirubin: 1.6 mg/dL — ABNORMAL HIGH (ref 0.3–1.2)
Total Protein: 7.3 g/dL (ref 6.5–8.1)

## 2015-10-10 LAB — BRAIN NATRIURETIC PEPTIDE: B NATRIURETIC PEPTIDE 5: 92 pg/mL (ref 0.0–100.0)

## 2015-10-10 LAB — LACTIC ACID, PLASMA: Lactic Acid, Venous: 2.3 mmol/L (ref 0.5–2.0)

## 2015-10-10 LAB — TROPONIN I

## 2015-10-10 MED ORDER — ALBUTEROL SULFATE (2.5 MG/3ML) 0.083% IN NEBU
5.0000 mg | INHALATION_SOLUTION | Freq: Once | RESPIRATORY_TRACT | Status: DC
  Filled 2015-10-10: qty 6

## 2015-10-10 MED ORDER — ALBUTEROL SULFATE (2.5 MG/3ML) 0.083% IN NEBU
2.5000 mg | INHALATION_SOLUTION | Freq: Once | RESPIRATORY_TRACT | Status: AC
  Administered 2015-10-10: 2.5 mg via RESPIRATORY_TRACT

## 2015-10-10 MED ORDER — IPRATROPIUM-ALBUTEROL 0.5-2.5 (3) MG/3ML IN SOLN
3.0000 mL | Freq: Once | RESPIRATORY_TRACT | Status: AC
  Administered 2015-10-10: 3 mL via RESPIRATORY_TRACT
  Filled 2015-10-10: qty 3

## 2015-10-10 MED ORDER — BENZONATATE 100 MG PO CAPS
200.0000 mg | ORAL_CAPSULE | Freq: Once | ORAL | Status: AC
  Administered 2015-10-10: 200 mg via ORAL
  Filled 2015-10-10: qty 2

## 2015-10-10 NOTE — ED Notes (Signed)
Pt c/o increased cough and sob.

## 2015-10-10 NOTE — ED Provider Notes (Signed)
By signing my name below, I, Iona Beard, attest that this documentation has been prepared under the direction and in the presence of Devoria Albe, MD.   Electronically Signed: Iona Beard, ED Scribe. 10/10/2015. 11:41 PM  HPI Comments: Stacie Wright is a 82 y.o. female who presents to the Emergency Department complaining of gradual onset, constant, cough onset "years", worsening about one week ago. Pt states subjective fever and shortness of breath. Pt is currently followed by ID, Dr. Daiva Eves and was prescribed antibiotics recently. She is on antibiotics (azithromycin, Ethambutol and rifampin) on Monday, Wednesday and Friday.  PHYSICAL EXAM:  CONSTITUTIONAL: Elderly female appears short of breath and seems confused,  PULMONARY: Breath sounds diminshed diffusely. Coughing frequently, sounds dry. Neuro seems confused  Medical screening examination/treatment/procedure(s) were conducted as a shared visit with non-physician practitioner(s) and myself.  I personally evaluated the patient during the encounter.   EKG Interpretation   Date/Time:  Wednesday October 10 2015 22:41:20 EST Ventricular Rate:  100 PR Interval:  148 QRS Duration: 106 QT Interval:  342 QTC Calculation: 441 R Axis:   74 Text Interpretation:  Sinus tachycardia Biatrial enlargement RSR' in V1 or  V2, right VCD or RVH Nonspecific ST abnormality No significant change was  found Confirmed by Manus Gunning  MD, STEPHEN [phone]) on 10/10/2015 10:57:46 PM       Devoria Albe, MD, FACEP   I personally performed the services described in this documentation, which was scribed in my presence. The recorded information has been reviewed and considered.  Devoria Albe, MD, Concha Pyo, MD 10/11/15 0001

## 2015-10-10 NOTE — ED Notes (Signed)
Escorted patient to restroom via wheelchair, patient tolerated w/o problems.

## 2015-10-10 NOTE — ED Notes (Signed)
CRITICAL VALUE ALERT  Critical value received:  Lactic acid 2.3  Date of notification:  10/10/15  Time of notification:  2325  Critical value read back:Yes.    Nurse who received alert:  Bronson Curb, RN  MD notified (1st page):  Idol, pa  Time of first page:  Idol, pa  MD notified (2nd page):  Time of second page:  Responding MD: idol, pa  Time MD responded:  2325

## 2015-10-10 NOTE — ED Provider Notes (Addendum)
CSN: [id number]     Arrival date & time 10/10/15  2109 History   First MD Initiated Contact with Patient 10/10/15 2240     Chief Complaint  Patient presents with  . Cough     (Consider location/radiation/quality/duration/timing/severity/associated sxs/prior Treatment) The history is provided by the patient, the spouse and a friend.   Stacie Wright is a 82 y.o. female with a history of bronchiectasis and has been treated also for Mycobacterium Avium -intracellulare,  presenting with increased coughing and shortness of breath along with subjective fever which has been worsened over the past week.  She was seen by her ID Dr. Daiva Eves and was started on azithromycin, ethambutol and rifampin one week ago.  She has also started on Mucinex which has not improved her coughing, although husband states she has not been drinking increased fluids which she was sized to do with this medication.  This evening she developed severe coughing which she's been unable to control making her increasingly short of breath.  She denies chest pain, has had no peripheral edema.  Her cough has been nonproductive mostly, but has been spitting clear saliva with cough episodes since arriving here.  She has had occasional intermittent wheezing.   Past Medical History  Diagnosis Date  . Hypertension   . COPD (chronic obstructive pulmonary disease) (HCC)   . Anxiety   . Shortness of breath   . Pneumonia 04/2013  . Headache(784.0)   . Arthritis   . MAI (mycobacterium avium-intracellulare) infection (HCC) 2014  . Bronchiectasis (HCC) 2014  . IBS (irritable bowel syndrome)   . Gout   . DVT (deep venous thrombosis) (HCC) 2008    LLE  . Mold exposure 06/20/2015   Past Surgical History  Procedure Laterality Date  . Abdominal hysterectomy    . Cholecystectomy    . Fracture surgery    . Cataract extraction     Family History  Problem Relation Age of Onset  . Diabetes Father   . Hypertension Father    Social History   Substance Use Topics  . Smoking status: Never Smoker   . Smokeless tobacco: Never Used  . Alcohol Use: No   OB History    No data available     Review of Systems  Constitutional: Positive for fever.  HENT: Negative for congestion and sore throat.   Eyes: Negative.   Respiratory: Positive for cough, shortness of breath and wheezing. Negative for chest tightness.   Cardiovascular: Negative for chest pain.  Gastrointestinal: Negative for nausea and abdominal pain.  Genitourinary: Negative.   Musculoskeletal: Negative for joint swelling, arthralgias and neck pain.  Skin: Negative.  Negative for rash and wound.  Neurological: Negative for dizziness, weakness, light-headedness, numbness and headaches.  Psychiatric/Behavioral: Negative.       Allergies  Amoxicillin; Penicillins; Sulfa antibiotics; and Darvocet  Home Medications   Prior to Admission medications   Medication Sig Start Date End Date Taking? Authorizing Provider  albuterol (PROVENTIL) (2.5 MG/3ML) 0.083% nebulizer solution Take 3 mLs (2.5 mg total) by nebulization every 6 (six) hours as needed for wheezing or shortness of breath. 01/19/15   Kari Baars, MD  allopurinol (ZYLOPRIM) 300 MG tablet Take 300 mg by mouth daily.    Historical Provider, MD  aspirin EC 81 MG tablet Take 81 mg by mouth daily.    Historical Provider, MD  atenolol (TENORMIN) 50 MG tablet Take 50 mg by mouth daily.    Historical Provider, MD  azithromycin (ZITHROMAX) 500 MG  tablet Take 1 tablet (500 mg total) by mouth 3 (three) times a week. 10/03/15   Randall Hiss, MD  beta carotene w/minerals (OCUVITE) tablet Take 1 tablet by mouth daily.    Historical Provider, MD  brimonidine-timolol (COMBIGAN) 0.2-0.5 % ophthalmic solution Place 1 drop into both eyes every 12 (twelve) hours.    Historical Provider, MD  dorzolamide (TRUSOPT) 2 % ophthalmic solution Place 1 drop into both eyes 2 (two) times daily.    Historical Provider, MD  ethambutol  (MYAMBUTOL) 400 MG tablet Take 4 tablets (1,600 mg total) by mouth 3 (three) times a week. 10/03/15   Randall Hiss, MD  guaiFENesin-codeine 100-10 MG/5ML syrup Take 10 mLs by mouth every 6 (six) hours as needed for cough. 01/19/15   Kari Baars, MD  hyoscyamine (LEVBID) 0.375 MG 12 hr tablet Take 0.1875 mg by mouth every morning.     Historical Provider, MD  latanoprost (XALATAN) 0.005 % ophthalmic solution Place 1 drop into both eyes at bedtime.    Historical Provider, MD  levofloxacin (LEVAQUIN) 500 MG tablet Take 1 tablet (500 mg total) by mouth daily. 09/10/15   Raeford Razor, MD  PARoxetine (PAXIL) 20 MG tablet Take 20 mg by mouth every morning.    Historical Provider, MD  predniSONE (DELTASONE) 20 MG tablet Take 2 tablets (40 mg total) by mouth daily. 09/10/15   Raeford Razor, MD  rifampin (RIFADIN) 300 MG capsule Take 2 capsules (600 mg total) by mouth 3 (three) times a week. 10/03/15   Randall Hiss, MD  triamterene-hydrochlorothiazide (MAXZIDE-25) 37.5-25 MG per tablet Take 1 tablet by mouth every other day.    Historical Provider, MD   BP 121/45 mmHg  Pulse 94  Temp(Src) 98.3 F (36.8 C) (Oral)  Resp 13  Ht  (1.727 m)  Wt 71.668 kg  BMI 24.03 kg/m2  SpO2 92% Physical Exam  Constitutional: She appears well-developed and well-nourished.  HENT:  Head: Normocephalic and atraumatic.  Eyes: Conjunctivae are normal.  Neck: Normal range of motion.  Cardiovascular: Normal rate, regular rhythm, normal heart sounds and intact distal pulses.   Pulmonary/Chest: Effort normal and breath sounds normal. No respiratory distress. She has no wheezes. She has no rales.  Frequent wet sounding cough with clear saliva expectoration.  Bilateral lower lung field crackles.  Abdominal: Soft. Bowel sounds are normal. There is no tenderness.  Musculoskeletal: Normal range of motion. She exhibits no edema.  Neurological: She is alert.  Skin: Skin is warm and dry.  Psychiatric: She has a  normal mood and affect.  Nursing note and vitals reviewed.   ED Course  Procedures (including critical care time) Labs Review Labs Reviewed  CBC WITH DIFFERENTIAL/PLATELET - Abnormal; Notable for the following:    WBC 17.0 (*)    Neutro Abs 13.2 (*)    Monocytes Absolute 1.7 (*)    All other components within normal limits  COMPREHENSIVE METABOLIC PANEL - Abnormal; Notable for the following:    Potassium 3.2 (*)    Chloride 93 (*)    Glucose, Bld 130 (*)    BUN 28 (*)    Creatinine, Ser 1.26 (*)    Total Bilirubin 1.6 (*)    GFR calc non Af Amer 38 (*)    GFR calc Af Amer 44 (*)    All other components within normal limits  LACTIC ACID, PLASMA - Abnormal; Notable for the following:    Lactic Acid, Venous 2.3 (*)  All other components within normal limits  CULTURE, BLOOD (ROUTINE X 2)  CULTURE, BLOOD (ROUTINE X 2)  BRAIN NATRIURETIC PEPTIDE  TROPONIN I    Imaging Review Dg Chest Portable 1 View  10/10/2015  CLINICAL DATA:  Productive cough for several weeks which worsened tonight. Initial encounter. EXAM: PORTABLE CHEST 1 VIEW COMPARISON:  Plain films of the chest 09/10/2015, 08/13/2015 and 01/15/2015. CT chest 04/25/2013. FINDINGS: Lung volumes are lower than on the comparison plain films of the chest. Chronic prominence of pulmonary interstitium with nodularity identified and bronchiectasis best seen in lingula are again seen. Given differences in lung volumes, the appearance of the chest is not markedly changed. Heart size is upper normal. No pneumothorax. IMPRESSION: No acute disease. Chronic reticulonodular pattern and bronchiectasis do not appear changed. Electronically Signed   By: Drusilla Kanner M.D.   On: 10/10/2015 23:12   I have personally reviewed and evaluated these images and lab results as part of my medical decision-making.   EKG Interpretation   Date/Time:  Wednesday October 10 2015 22:41:20 EST Ventricular Rate:  100 PR Interval:  148 QRS Duration:  106 QT Interval:  342 QTC Calculation: 441 R Axis:   74 Text Interpretation:  Sinus tachycardia Biatrial enlargement RSR' in V1 or  V2, right VCD or RVH Nonspecific ST abnormality No significant change was  found Confirmed by Manus Gunning  MD, STEPHEN [phone]) on 10/10/2015 10:57:46 PM      MDM   Final diagnoses:  None    Patients  labs reviewed.  Radiological studies were viewed, interpreted and considered during the medical decision making and disposition process. I agree with radiologists reading.  Results were also discussed with patient.  Patient with mild dehydration, borderline lactic acid which should improve with IV fluids, patient was given 1 L IV prior to discharge home.  Her breathing is stable, pulse ox stays 93-94% on room air.  She was encouraged to continue using her home nebulizer treatments every 4 hours as needed.  She was prescribed Tessalon pearls, guaifenesin with codeine cough syrup.  Advised increase fluid intake with this medication.  When necessary follow-up here for any worsening symptoms, otherwise follow-up with PCP and/or Dr. Algis Liming as needed.  Patient was seen by Dr. Lynelle Doctor during this visit.  Patient was given the option of an overnight observation admission given her age, low-grade fever and dehydration.  She was not interested in staying in here, and with her mild dementia, husband states that she would not be able to stay here by herself, however he would be willing to stay with her.  Patient was still insistent on going home.  They are capable of making this decision, advised returning here for any worsening symptoms.     Burgess Amor, PA-C 10/11/15 8119  Devoria Albe, MD 10/11/15 0202  At re-exam, pt off oxygen with O2 sats of 84-88 %.  Denies sob but pt has mild dementia motivated to go home, no obvious work of breathing, no tachypnea. Pt would be best served with observation admission.    Burgess Amor, PA-C 10/11/15 0218  Burgess Amor, PA-C 10/11/15  1478  Devoria Albe, MD 10/11/15 [phone]

## 2015-10-11 ENCOUNTER — Encounter (HOSPITAL_COMMUNITY): Payer: Self-pay | Admitting: *Deleted

## 2015-10-11 DIAGNOSIS — Z8249 Family history of ischemic heart disease and other diseases of the circulatory system: Secondary | ICD-10-CM | POA: Diagnosis not present

## 2015-10-11 DIAGNOSIS — F0280 Dementia in other diseases classified elsewhere without behavioral disturbance: Secondary | ICD-10-CM

## 2015-10-11 DIAGNOSIS — J479 Bronchiectasis, uncomplicated: Secondary | ICD-10-CM

## 2015-10-11 DIAGNOSIS — I10 Essential (primary) hypertension: Secondary | ICD-10-CM

## 2015-10-11 DIAGNOSIS — J9601 Acute respiratory failure with hypoxia: Secondary | ICD-10-CM | POA: Diagnosis present

## 2015-10-11 DIAGNOSIS — E876 Hypokalemia: Secondary | ICD-10-CM | POA: Diagnosis present

## 2015-10-11 DIAGNOSIS — J471 Bronchiectasis with (acute) exacerbation: Secondary | ICD-10-CM | POA: Diagnosis not present

## 2015-10-11 DIAGNOSIS — A310 Pulmonary mycobacterial infection: Secondary | ICD-10-CM | POA: Diagnosis present

## 2015-10-11 DIAGNOSIS — Z833 Family history of diabetes mellitus: Secondary | ICD-10-CM | POA: Diagnosis not present

## 2015-10-11 DIAGNOSIS — G309 Alzheimer's disease, unspecified: Secondary | ICD-10-CM

## 2015-10-11 DIAGNOSIS — J441 Chronic obstructive pulmonary disease with (acute) exacerbation: Secondary | ICD-10-CM

## 2015-10-11 DIAGNOSIS — R0602 Shortness of breath: Secondary | ICD-10-CM | POA: Diagnosis present

## 2015-10-11 LAB — BASIC METABOLIC PANEL
ANION GAP: 12 (ref 5–15)
BUN: 25 mg/dL — ABNORMAL HIGH (ref 6–20)
CALCIUM: 8.4 mg/dL — AB (ref 8.9–10.3)
CO2: 28 mmol/L (ref 22–32)
CREATININE: 1.09 mg/dL — AB (ref 0.44–1.00)
Chloride: 97 mmol/L — ABNORMAL LOW (ref 101–111)
GFR calc Af Amer: 52 mL/min — ABNORMAL LOW (ref >60)
GFR, EST NON AFRICAN AMERICAN: 45 mL/min — AB (ref >60)
GLUCOSE: 135 mg/dL — AB (ref 65–99)
Potassium: 2.9 mmol/L — ABNORMAL LOW (ref 3.5–5.1)
Sodium: 137 mmol/L (ref 135–145)

## 2015-10-11 LAB — CBC
HCT: 36.4 % (ref 36.0–46.0)
HEMOGLOBIN: 11.9 g/dL — AB (ref 12.0–15.0)
MCH: 31.9 pg (ref 26.0–34.0)
MCHC: 32.7 g/dL (ref 30.0–36.0)
MCV: 97.6 fL (ref 78.0–100.0)
PLATELETS: 217 10*3/uL (ref 150–400)
RBC: 3.73 MIL/uL — ABNORMAL LOW (ref 3.87–5.11)
RDW: 13.7 % (ref 11.5–15.5)
WBC: 13.9 10*3/uL — ABNORMAL HIGH (ref 4.0–10.5)

## 2015-10-11 LAB — MAGNESIUM: Magnesium: 1.6 mg/dL — ABNORMAL LOW (ref 1.7–2.4)

## 2015-10-11 MED ORDER — TIMOLOL MALEATE 0.5 % OP SOLN
1.0000 [drp] | Freq: Two times a day (BID) | OPHTHALMIC | Status: DC
  Administered 2015-10-11 – 2015-10-12 (×3): 1 [drp] via OPHTHALMIC
  Filled 2015-10-11: qty 5

## 2015-10-11 MED ORDER — PAROXETINE HCL 20 MG PO TABS
20.0000 mg | ORAL_TABLET | Freq: Every day | ORAL | Status: DC
  Administered 2015-10-11 – 2015-10-12 (×2): 20 mg via ORAL
  Filled 2015-10-11 (×2): qty 1

## 2015-10-11 MED ORDER — SODIUM CHLORIDE 0.9% FLUSH
3.0000 mL | Freq: Two times a day (BID) | INTRAVENOUS | Status: DC
  Administered 2015-10-12: 3 mL via INTRAVENOUS

## 2015-10-11 MED ORDER — BRIMONIDINE TARTRATE 0.2 % OP SOLN
1.0000 [drp] | Freq: Two times a day (BID) | OPHTHALMIC | Status: DC
  Administered 2015-10-11 – 2015-10-12 (×3): 1 [drp] via OPHTHALMIC
  Filled 2015-10-11: qty 5

## 2015-10-11 MED ORDER — ACETAMINOPHEN 650 MG RE SUPP: 650.0000 mg | Freq: Four times a day (QID) | RECTAL | Status: DC | PRN

## 2015-10-11 MED ORDER — ETHAMBUTOL HCL 400 MG PO TABS
1600.0000 mg | ORAL_TABLET | ORAL | Status: DC
  Administered 2015-10-12: 1600 mg via ORAL
  Filled 2015-10-11 (×2): qty 4

## 2015-10-11 MED ORDER — OXYCODONE HCL 5 MG PO TABS
5.0000 mg | ORAL_TABLET | ORAL | Status: DC | PRN
Start: 2015-10-11 — End: 2015-10-12

## 2015-10-11 MED ORDER — OCUVITE PO TABS
1.0000 | ORAL_TABLET | Freq: Every day | ORAL | Status: DC
Start: 2015-10-11 — End: 2015-10-12
  Administered 2015-10-11 – 2015-10-12 (×2): 1 via ORAL
  Filled 2015-10-11 (×5): qty 1

## 2015-10-11 MED ORDER — CETYLPYRIDINIUM CHLORIDE 0.05 % MT LIQD
7.0000 mL | Freq: Two times a day (BID) | OROMUCOSAL | Status: DC
  Administered 2015-10-11 – 2015-10-12 (×3): 7 mL via OROMUCOSAL

## 2015-10-11 MED ORDER — GUAIFENESIN-CODEINE 100-10 MG/5ML PO SOLN
5.0000 mL | Freq: Once | ORAL | Status: AC
  Administered 2015-10-11: 5 mL via ORAL
  Filled 2015-10-11: qty 5

## 2015-10-11 MED ORDER — BENZONATATE 200 MG PO CAPS: 200.0000 mg | ORAL_CAPSULE | Freq: Three times a day (TID) | ORAL | Status: DC | PRN

## 2015-10-11 MED ORDER — HYOSCYAMINE SULFATE ER 0.375 MG PO TB12: 0.1875 mg | ORAL_TABLET | Freq: Every morning | ORAL | Status: DC

## 2015-10-11 MED ORDER — ONDANSETRON HCL 4 MG PO TABS
4.0000 mg | ORAL_TABLET | Freq: Four times a day (QID) | ORAL | Status: DC | PRN
Start: 2015-10-11 — End: 2015-10-12

## 2015-10-11 MED ORDER — ACETAMINOPHEN 325 MG PO TABS: 650.0000 mg | ORAL_TABLET | Freq: Four times a day (QID) | ORAL | Status: DC | PRN

## 2015-10-11 MED ORDER — POTASSIUM CHLORIDE CRYS ER 20 MEQ PO TBCR
40.0000 meq | EXTENDED_RELEASE_TABLET | ORAL | Status: AC
  Administered 2015-10-11 (×3): 40 meq via ORAL
  Filled 2015-10-11 (×3): qty 2

## 2015-10-11 MED ORDER — BRIMONIDINE TARTRATE-TIMOLOL 0.2-0.5 % OP SOLN: 1.0000 [drp] | Freq: Two times a day (BID) | OPHTHALMIC | Status: DC

## 2015-10-11 MED ORDER — HYDROMORPHONE HCL 1 MG/ML IJ SOLN
0.5000 mg | INTRAMUSCULAR | Status: DC | PRN
  Administered 2015-10-11: 1 mg via INTRAVENOUS
  Filled 2015-10-11: qty 1

## 2015-10-11 MED ORDER — SODIUM CHLORIDE 0.9 % IV BOLUS (SEPSIS)
1000.0000 mL | Freq: Once | INTRAVENOUS | Status: AC
  Administered 2015-10-11: 1000 mL via INTRAVENOUS

## 2015-10-11 MED ORDER — TRIAMTERENE-HCTZ 37.5-25 MG PO TABS
1.0000 | ORAL_TABLET | ORAL | Status: DC
  Administered 2015-10-11: 1 via ORAL
  Filled 2015-10-11 (×2): qty 1

## 2015-10-11 MED ORDER — ASPIRIN EC 81 MG PO TBEC
81.0000 mg | DELAYED_RELEASE_TABLET | Freq: Every day | ORAL | Status: DC
  Administered 2015-10-11 – 2015-10-12 (×2): 81 mg via ORAL
  Filled 2015-10-11 (×2): qty 1

## 2015-10-11 MED ORDER — GUAIFENESIN-CODEINE 100-10 MG/5ML PO SOLN
10.0000 mL | Freq: Four times a day (QID) | ORAL | Status: DC | PRN
  Administered 2015-10-11 (×2): 10 mL via ORAL
  Filled 2015-10-11 (×2): qty 10

## 2015-10-11 MED ORDER — ONDANSETRON HCL 4 MG/2ML IJ SOLN: 4.0000 mg | Freq: Four times a day (QID) | INTRAMUSCULAR | Status: DC | PRN

## 2015-10-11 MED ORDER — AZITHROMYCIN 250 MG PO TABS
500.0000 mg | ORAL_TABLET | ORAL | Status: DC
  Administered 2015-10-12: 500 mg via ORAL
  Filled 2015-10-11: qty 2

## 2015-10-11 MED ORDER — PREDNISONE 20 MG PO TABS
40.0000 mg | ORAL_TABLET | Freq: Every day | ORAL | Status: DC
  Administered 2015-10-11 – 2015-10-12 (×2): 40 mg via ORAL
  Filled 2015-10-11 (×2): qty 2

## 2015-10-11 MED ORDER — ENOXAPARIN SODIUM 40 MG/0.4ML ~~LOC~~ SOLN
40.0000 mg | SUBCUTANEOUS | Status: DC
  Administered 2015-10-11 – 2015-10-12 (×2): 40 mg via SUBCUTANEOUS
  Filled 2015-10-11 (×2): qty 0.4

## 2015-10-11 MED ORDER — ATENOLOL 25 MG PO TABS
50.0000 mg | ORAL_TABLET | Freq: Every day | ORAL | Status: DC
  Administered 2015-10-11 – 2015-10-12 (×2): 50 mg via ORAL
  Filled 2015-10-11 (×2): qty 2

## 2015-10-11 MED ORDER — SODIUM CHLORIDE 0.9 % IV SOLN
INTRAVENOUS | Status: DC
  Administered 2015-10-11: 05:00:00 via INTRAVENOUS

## 2015-10-11 MED ORDER — ALUM & MAG HYDROXIDE-SIMETH 200-200-20 MG/5ML PO SUSP: 30.0000 mL | Freq: Four times a day (QID) | ORAL | Status: DC | PRN

## 2015-10-11 MED ORDER — LATANOPROST 0.005 % OP SOLN
1.0000 [drp] | Freq: Every day | OPHTHALMIC | Status: DC
  Administered 2015-10-11: 1 [drp] via OPHTHALMIC
  Filled 2015-10-11: qty 2.5

## 2015-10-11 MED ORDER — RIFAMPIN 300 MG PO CAPS
600.0000 mg | ORAL_CAPSULE | ORAL | Status: DC
  Administered 2015-10-12: 600 mg via ORAL
  Filled 2015-10-11 (×2): qty 2

## 2015-10-11 MED ORDER — ALLOPURINOL 300 MG PO TABS
300.0000 mg | ORAL_TABLET | Freq: Every day | ORAL | Status: DC
  Administered 2015-10-11 – 2015-10-12 (×2): 300 mg via ORAL
  Filled 2015-10-11 (×2): qty 1

## 2015-10-11 NOTE — H&P (Signed)
[facility] Admission History and Physical       Stacie Wright ZOX:[id number] DOB: 1928/09/11 DOA: 10/10/2015  Referring physician: EDP PCP: Dwana Melena, MD  Specialists:   Chief Complaint: Worsening SOB and Cough  HPI: Stacie Wright is a 82 y.o. female with a history of Bronchiectasis, Recent diagnosis of MAI, HTN, Gout, and Dementia who presented to the ED with complaints of worsening SOB and Cough.  Her husband given the history and reports that she had had paroxysms of coughing so bad that she can not catch her breath.   She has also been coughing up more greenish mucus.  She was found to have decreased O2 sats into the mid-80's and was placed on 3 liters of NCO2.     Of Note: She had been diagnosed with MAI and was placed on Rx of Azithromycin, Ethambutol, and Rifampin by Infectious Diseases Specialist Dr Daiva Eves 1 week ago.      Review of Systems: Unable to Obtain from the Patient   Past Medical History  Diagnosis Date  . Hypertension   . COPD (chronic obstructive pulmonary disease) (HCC)   . Anxiety   . Shortness of breath   . Pneumonia 04/2013  . Headache(784.0)   . Arthritis   . MAI (mycobacterium avium-intracellulare) infection (HCC) 2014  . Bronchiectasis (HCC) 2014  . IBS (irritable bowel syndrome)   . Gout   . DVT (deep venous thrombosis) (HCC) 2008    LLE  . Mold exposure 06/20/2015     Past Surgical History  Procedure Laterality Date  . Abdominal hysterectomy    . Cholecystectomy    . Fracture surgery    . Cataract extraction        Prior to Admission medications   Medication Sig Start Date End Date Taking? Authorizing Provider  albuterol (PROVENTIL) (2.5 MG/3ML) 0.083% nebulizer solution Take 3 mLs (2.5 mg total) by nebulization every 6 (six) hours as needed for wheezing or shortness of breath. 01/19/15   Kari Baars, MD  allopurinol (ZYLOPRIM) 300 MG tablet Take 300 mg by mouth daily.    Historical Provider, MD  aspirin EC 81 MG tablet  Take 81 mg by mouth daily.    Historical Provider, MD  atenolol (TENORMIN) 50 MG tablet Take 50 mg by mouth daily.    Historical Provider, MD  azithromycin (ZITHROMAX) 500 MG tablet Take 1 tablet (500 mg total) by mouth 3 (three) times a week. 10/03/15   Randall Hiss, MD  benzonatate (TESSALON) 200 MG capsule Take 1 capsule (200 mg total) by mouth 3 (three) times daily as needed. 10/11/15   Burgess Amor, PA-C  beta carotene w/minerals (OCUVITE) tablet Take 1 tablet by mouth daily.    Historical Provider, MD  brimonidine-timolol (COMBIGAN) 0.2-0.5 % ophthalmic solution Place 1 drop into both eyes every 12 (twelve) hours.    Historical Provider, MD  dorzolamide (TRUSOPT) 2 % ophthalmic solution Place 1 drop into both eyes 2 (two) times daily.    Historical Provider, MD  ethambutol (MYAMBUTOL) 400 MG tablet Take 4 tablets (1,600 mg total) by mouth 3 (three) times a week. 10/03/15   Randall Hiss, MD  guaiFENesin-codeine 100-10 MG/5ML syrup Take 10 mLs by mouth every 6 (six) hours as needed for cough. 01/19/15   Kari Baars, MD  hyoscyamine (LEVBID) 0.375 MG 12 hr tablet Take 0.1875 mg by mouth every morning.     Historical Provider, MD  latanoprost (XALATAN) 0.005 % ophthalmic solution Place 1  drop into both eyes at bedtime.    Historical Provider, MD  levofloxacin (LEVAQUIN) 500 MG tablet Take 1 tablet (500 mg total) by mouth daily. 09/10/15   Raeford Razor, MD  PARoxetine (PAXIL) 20 MG tablet Take 20 mg by mouth every morning.    Historical Provider, MD  predniSONE (DELTASONE) 20 MG tablet Take 2 tablets (40 mg total) by mouth daily. 09/10/15   Raeford Razor, MD  rifampin (RIFADIN) 300 MG capsule Take 2 capsules (600 mg total) by mouth 3 (three) times a week. 10/03/15   Randall Hiss, MD  triamterene-hydrochlorothiazide (MAXZIDE-25) 37.5-25 MG per tablet Take 1 tablet by mouth every other day.    Historical Provider, MD     Allergies  Allergen Reactions  . Amoxicillin Anaphylaxis  and Rash  . Penicillins Anaphylaxis and Rash    Has patient had a PCN reaction causing immediate rash, facial/tongue/throat swelling, SOB or lightheadedness with hypotension: Yes Has patient had a PCN reaction causing severe rash involving mucus membranes or skin necrosis: No Has patient had a PCN reaction that required hospitalization No Has patient had a PCN reaction occurring within the last 10 years: No If all of the above answers are "NO", then may proceed with Cephalosporin use.   . Sulfa Antibiotics Anaphylaxis and Rash  . Darvocet [Propoxyphene N-Acetaminophen]     hallucinations    Social History:  reports that she has never smoked. She has never used smokeless tobacco. She reports that she does not drink alcohol or use illicit drugs.    Family History  Problem Relation Age of Onset  . Diabetes Father   . Hypertension Father        Physical Exam:  GEN:  Pleasant Elderly Obese  83 y.o. Caucasian female examined and in no acute distress; cooperative with exam Filed Vitals:   10/11/15 0130 10/11/15 0200 10/11/15 0230 10/11/15 0300  BP: 121/45 129/45 126/51 124/50  Pulse: 94 96 95 89  Temp:      TempSrc:      Resp: Height:      Weight:      SpO2: 92% 91% 87% 92%   Blood pressure 124/50, pulse 89, temperature 98.3 F (36.8 C), temperature source Oral, resp. rate 17, height  (1.727 m), weight 71.668 kg (158 lb), SpO2 92 %. PSYCH: He is alert and oriented x 1; does not appear anxious does not appear depressed; affect is normal HEENT: Normocephalic and Atraumatic, Mucous membranes pink; PERRLA; EOM intact; Fundi:  Benign;  No scleral icterus, Nares: Patent, Oropharynx: Clear, Sparse Dentition,     Neck:  FROM, No Cervical Lymphadenopathy nor Thyromegaly or Carotid Bruit; No JVD; Breasts:: Not examined CHEST WALL: No tenderness CHEST: Normal respiration, clear to auscultation bilaterally HEART: Regular rate and rhythm; no murmurs rubs or gallops BACK: No  kyphosis or scoliosis; No CVA tenderness ABDOMEN: Positive Bowel Sounds, Obese, Soft Non-Tender, No Rebound or Guarding; No Masses, No Organomegaly. Rectal Exam: Not done EXTREMITIES: No Cyanosis, Clubbing, or Edema; No Ulcerations. Genitalia: not examined PULSES: 2+ and symmetric SKIN: Normal hydration no rash or ulceration CNS:  Alert and Oriented x 1, No Focal Deficits Vascular: pulses palpable throughout    Labs on Admission:  Basic Metabolic Panel:  Recent Labs Lab 10/10/15 2240  NA 136  K 3.2*  CL 93*  CO2 29  GLUCOSE 130*  BUN 28*  CREATININE 1.26*  CALCIUM 9.1   Liver Function Tests:  Recent Labs Lab 10/10/15  2240  AST 26  ALT 16  ALKPHOS 77  BILITOT 1.6*  PROT 7.3  ALBUMIN 3.6   No results for input(s): LIPASE, AMYLASE in the last 168 hours. No results for input(s): AMMONIA in the last 168 hours. CBC:  Recent Labs Lab 10/10/15 2240  WBC 17.0*  NEUTROABS 13.2*  HGB 13.5  HCT 40.7  MCV 97.6  PLT 257   Cardiac Enzymes:  Recent Labs Lab 10/10/15 2240  TROPONINI <0.03    BNP (last 3 results)  Recent Labs  10/10/15 2240  BNP 92.0    ProBNP (last 3 results) No results for input(s): PROBNP in the last 8760 hours.  CBG: No results for input(s): GLUCAP in the last 168 hours.  Radiological Exams on Admission: Dg Chest Portable 1 View  10/10/2015  CLINICAL DATA:  Productive cough for several weeks which worsened tonight. Initial encounter. EXAM: PORTABLE CHEST 1 VIEW COMPARISON:  Plain films of the chest 09/10/2015, 08/13/2015 and 01/15/2015. CT chest 04/25/2013. FINDINGS: Lung volumes are lower than on the comparison plain films of the chest. Chronic prominence of pulmonary interstitium with nodularity identified and bronchiectasis best seen in lingula are again seen. Given differences in lung volumes, the appearance of the chest is not markedly changed. Heart size is upper normal. No pneumothorax. IMPRESSION: No acute disease. Chronic  reticulonodular pattern and bronchiectasis do not appear changed. Electronically Signed   By: Drusilla Kanner M.D.   On: 10/10/2015 23:12     EKG: Independently reviewed. Sinus Tachycardia rate = 100    Assessment/Plan:   82 y.o. female with  Principal Problem:   1.    COPD exacerbation (HCC)/ Bronchiectasis (HCC)   Duonebs   O2 PRN      Active Problems:   2.    Acute respiratory failure with hypoxia (HCC)   NCO2 PRN   Monitor O2 Sats       3.    Mycobacterium avium-intracellulare infection (HCC)   Continue Azithromycin, Ethambutol, and Rifampin Rx   Consult ID in AM     4.    Hypertension   Continue Atenolol Rx      5.    Hypokalemia   Oral KCl replacement   Check Magnesium     5.    Dementia in Alzheimer's disease   Chronic     6.    DVT Prophylaxis   Lovenox     Code Status:     FULL CODE        Family Communication:   Husband at Bedside     Disposition Plan:    Inpatient Status        Time spent:  75 Minutes      Ron Parker [facility] Pager [phone]   If 7AM -7PM Please Contact the Day Rounding Team MD for [facility]  If 7PM-7AM, Please Contact Night-Floor Coverage  [email or web address] Password [email or web address] 10/11/2015, 3:22 AM     ADDENDUM:   Patient was seen and examined on 10/11/2015

## 2015-10-11 NOTE — Discharge Instructions (Signed)
Bronchiectasis Bronchiectasis is a condition in which the airways (bronchi) are damaged and widened. This makes it difficult for the lungs to get rid of mucus. As a result, mucus gathers in the airways, and this often leads to lung infections. Infection can cause inflammation in the airways, which may further weaken and damage the bronchi.  CAUSES  Bronchiectasis may be present at birth (congenital) or may develop later in life. Sometimes there is no apparent cause. Some common causes include:  Cystic fibrosis.   Recurrent lung infections (such as pneumonia, tuberculosis, or fungal infections).  Foreign bodies or other blockages in the lungs.  Breathing in fluid, food, or other foreign objects (aspiration). SIGNS AND SYMPTOMS  Common symptoms include:  A daily cough that brings up mucus and lasts for more than 3 weeks.  Frequent lung infections (such as pneumonia, tuberculosis, or fungal infections).  Shortness of breath and wheezing.   Weakness and fatigue. DIAGNOSIS  Various tests may be done to help diagnose bronchiectasis. Tests may include:  Chest X-rays or CT scans.   Breathing tests to help determine how your lungs are working.   Sputum cultures to check for infection.   Blood tests and other tests to check for related diseases or causes, such as cystic fibrosis. TREATMENT  Treatment varies depending on the severity of the condition. Medicines may be given to loosen the mucus to be coughed up (expectorants), to relax the muscles of the air passages (bronchodilators), or to prevent or treat infections (antibiotics). Physical therapy methods may be recommended to help clear mucus from the lungs. For severe cases, surgery may be done to remove the affected part of the lung. HOME CARE INSTRUCTIONS   Get plenty of rest.   Only take over-the-counter or prescription medicines as directed by your health care provider. If antibiotic medicines were prescribed, take them as  directed. Finish them even if you start to feel better.  Avoid sedatives and antihistamines unless otherwise directed by your health care provider. These medicines tend to thicken the mucus in the lungs.   Perform any breathing exercises or techniques to clear the lungs as directed by your health care provider.  Drink enough fluids to keep your urine clear or pale yellow.  Consider using a cold steam vaporizer or humidifier in your room or home to help loosen secretions.   If the cough is worse at night, try sleeping in a semi-upright position in a recliner or using a couple of pillows.   Avoid cigarette smoke and lung irritants. If you smoke, quit.  Stay inside when pollution and ozone levels are high.   Stay current with vaccinations and immunizations.   Follow up with your health care provider as directed.  SEEK MEDICAL CARE IF:  You cough up more thick, discolored mucus (sputum) that is yellow to green in color.  You have a fever or persistent symptoms for more than 2-3 days.  You cannot control your cough and are losing sleep. SEEK IMMEDIATE MEDICAL CARE IF:   You cough up blood.   You have chest pain or increasing shortness of breath.   You have pain that is getting worse or is uncontrolled with medicines.   You have a fever and your symptoms suddenly get worse. MAKE SURE YOU:  Understand these instructions.   Will watch your condition.   Will get help right away if you are not doing well or get worse.    This information is not intended to replace advice given to   you by your health care provider. Make sure you discuss any questions you have with your health care provider.   Document Released: 06/29/2007 Document Revised: 09/06/2013 Document Reviewed: 03/09/2013 Elsevier Interactive Patient Education 2016 [employer]  

## 2015-10-11 NOTE — Care Management Note (Signed)
Case Management Note  Patient Details  Name: Stacie Wright MRN: [id number] Date of Birth: January 04, 1928  Subjective/Objective:        Discussed discharge plan with patient and spouse. From home independent.  Uses no DME or O2.  Denies issues with medications or transportation to appointments.          Action/Plan: No CM needs identified but will monitor case for additional needs.  Expected Discharge Date:                  Expected Discharge Plan:  Home/Self Care  In-House Referral:     Discharge planning Services  CM Consult  Post Acute Care Choice:    Choice offered to:  Spouse, Patient  DME Arranged:    DME Agency:     HH Arranged:    HH Agency:  [facility]  Status of Service:  In process, will continue to follow  Medicare Important Message Given:    Date Medicare IM Given:    Medicare IM give by:    Date Additional Medicare IM Given:    Additional Medicare Important Message give by:     If discussed at Long Length of Stay Meetings, dates discussed:    Additional Comments:  Adonis Huguenin, RN 10/11/2015, 4:23 PM

## 2015-10-11 NOTE — Progress Notes (Signed)
Patient seen and examined. Chart review. Patient admitted earlier today for worsening shortness of breath and cough. She has a history of bronchiectasis and MAI. Chest x-ray does not show acute disease, however has her chronic bronchiectasis changes. She remains afebrile. Not entirely certain that she has an acute infection and as such will not order new antibiotics. We'll continue her azithromycin, ethambutol and rifampin as prescribed by infectious disease for treatment of her MAI. Her potassium is 2.9 today, will replete and check a magnesium level to see if this also needs to be replaced. Likely discharge home within 24 hours.  Peggye Pitt, MD [facility] Pager: [phone]

## 2015-10-12 LAB — BASIC METABOLIC PANEL
Anion gap: 5 (ref 5–15)
BUN: 16 mg/dL (ref 6–20)
CALCIUM: 8.4 mg/dL — AB (ref 8.9–10.3)
CO2: 30 mmol/L (ref 22–32)
CREATININE: 0.93 mg/dL (ref 0.44–1.00)
Chloride: 107 mmol/L (ref 101–111)
GFR calc Af Amer: >60 mL/min (ref >60)
GFR, EST NON AFRICAN AMERICAN: 54 mL/min — AB (ref >60)
GLUCOSE: 93 mg/dL (ref 65–99)
Potassium: 3.9 mmol/L (ref 3.5–5.1)
SODIUM: 142 mmol/L (ref 135–145)

## 2015-10-12 LAB — CBC
HEMATOCRIT: 35.9 % — AB (ref 36.0–46.0)
Hemoglobin: 11.5 g/dL — ABNORMAL LOW (ref 12.0–15.0)
MCH: 31.9 pg (ref 26.0–34.0)
MCHC: 32 g/dL (ref 30.0–36.0)
MCV: 99.7 fL (ref 78.0–100.0)
PLATELETS: 201 10*3/uL (ref 150–400)
RBC: 3.6 MIL/uL — ABNORMAL LOW (ref 3.87–5.11)
RDW: 13.8 % (ref 11.5–15.5)
WBC: 9.3 10*3/uL (ref 4.0–10.5)

## 2015-10-12 MED ORDER — MAGNESIUM OXIDE 400 (241.3 MG) MG PO TABS
400.0000 mg | ORAL_TABLET | Freq: Three times a day (TID) | ORAL | Status: DC
  Administered 2015-10-12 (×2): 400 mg via ORAL
  Filled 2015-10-12 (×2): qty 1

## 2015-10-12 MED ORDER — MAGNESIUM OXIDE 400 (241.3 MG) MG PO TABS: 400.0000 mg | ORAL_TABLET | Freq: Three times a day (TID) | ORAL | Status: DC

## 2015-10-12 NOTE — Discharge Summary (Signed)
Physician Discharge Summary  CYNDI MONTEJANO WUJ:[id number] DOB: May 20, 1928 DOA: 10/10/2015  PCP: Dwana Melena, MD  Admit date: 10/10/2015 Discharge date: 10/12/2015  Time spent: 45 minutes  Recommendations for Outpatient Follow-up:  -We discharge him today. -Advised to follow-up with primary care provider in 2 weeks.   Discharge Diagnoses:  Principal Problem:   Bronchiectasis (HCC) Active Problems:   Hypertension   COPD exacerbation (HCC)   Mycobacterium avium-intracellulare infection (HCC)   Dementia in Alzheimer's disease   Acute respiratory failure with hypoxia (HCC)   Acute exacerbation of bronchiectasis (HCC)   Discharge Condition: Stable and improved  Filed Weights   10/10/15 2117 10/11/15 0524  Weight: 71.668 kg (158 lb) 70.943 kg (156 lb 6.4 oz)    History of present illness:  As per Dr. Lovell Sheehan on 1/26: SARIA HARAN is a 82 y.o. female with a history of Bronchiectasis, Recent diagnosis of MAI, HTN, Gout, and Dementia who presented to the ED with complaints of worsening SOB and Cough. Her husband given the history and reports that she had had paroxysms of coughing so bad that she can not catch her breath. She has also been coughing up more greenish mucus. She was found to have decreased O2 sats into the mid-80's and was placed on 3 liters of NCO2.   Of Note: She had been diagnosed with MAI and was placed on Rx of Azithromycin, Ethambutol, and Rifampin by Infectious Diseases Specialist Dr Daiva Eves 1 week ago.   Hospital Course:   Cough, shortness of breath -Improved, likely related to her diagnosis of pulmonary MAI. -Do not believe further antibiotics are required at this point.  -We'll continue triple therapy for MAI with azithromycin, ethambutol and rifampin.  Rest of chronic medical issues remain stable and home medications have not changed.  Procedures:  None   Consultations:  None  Discharge Instructions  Discharge Instructions    Increase activity slowly    Complete by:  As directed             Medication List    STOP taking these medications        furosemide 40 MG tablet  Commonly known as:  LASIX     levofloxacin 500 MG tablet  Commonly known as:  LEVAQUIN      TAKE these medications        albuterol (2.5 MG/3ML) 0.083% nebulizer solution  Commonly known as:  PROVENTIL  Take 3 mLs (2.5 mg total) by nebulization every 6 (six) hours as needed for wheezing or shortness of breath.     allopurinol 300 MG tablet  Commonly known as:  ZYLOPRIM  Take 300 mg by mouth daily.     aspirin EC 81 MG tablet  Take 81 mg by mouth daily.     atenolol 50 MG tablet  Commonly known as:  TENORMIN  Take 50 mg by mouth daily.     azithromycin 500 MG tablet  Commonly known as:  ZITHROMAX  Take 1 tablet (500 mg total) by mouth 3 (three) times a week.     benzonatate 200 MG capsule  Commonly known as:  TESSALON  Take 1 capsule (200 mg total) by mouth 3 (three) times daily as needed.     beta carotene w/minerals tablet  Take 1 tablet by mouth daily.     COMBIGAN 0.2-0.5 % ophthalmic solution  Generic drug:  brimonidine-timolol  Place 1 drop into both eyes every 12 (twelve) hours.     dorzolamide 2 %  ophthalmic solution  Commonly known as:  TRUSOPT  Place 1 drop into both eyes 2 (two) times daily.     ethambutol 400 MG tablet  Commonly known as:  MYAMBUTOL  Take 4 tablets (1,600 mg total) by mouth 3 (three) times a week.     guaiFENesin-codeine 100-10 MG/5ML syrup  Take 10 mLs by mouth every 6 (six) hours as needed for cough.     hyoscyamine 0.375 MG 12 hr tablet  Commonly known as:  LEVBID  Take 0.1875 mg by mouth every morning.     latanoprost 0.005 % ophthalmic solution  Commonly known as:  XALATAN  Place 1 drop into both eyes at bedtime.     magnesium oxide 400 (241.3 Mg) MG tablet  Commonly known as:  MAG-OX  Take 1 tablet (400 mg total) by mouth 3 (three) times daily.     PARoxetine 20 MG  tablet  Commonly known as:  PAXIL  Take 20 mg by mouth every morning.     predniSONE 20 MG tablet  Commonly known as:  DELTASONE  Take 2 tablets (40 mg total) by mouth daily.     rifampin 300 MG capsule  Commonly known as:  RIFADIN  Take 2 capsules (600 mg total) by mouth 3 (three) times a week.     triamterene-hydrochlorothiazide 37.5-25 MG tablet  Commonly known as:  MAXZIDE-25  Take 1 tablet by mouth every other day.       Allergies  Allergen Reactions  . Amoxicillin Anaphylaxis and Rash  . Penicillins Anaphylaxis and Rash    Has patient had a PCN reaction causing immediate rash, facial/tongue/throat swelling, SOB or lightheadedness with hypotension: Yes Has patient had a PCN reaction causing severe rash involving mucus membranes or skin necrosis: No Has patient had a PCN reaction that required hospitalization No Has patient had a PCN reaction occurring within the last 10 years: No If all of the above answers are "NO", then may proceed with Cephalosporin use.   . Sulfa Antibiotics Anaphylaxis and Rash  . Darvocet [Propoxyphene N-Acetaminophen]     hallucinations       Follow-up Information    Follow up with Dwana Melena, MD.   Specialty:  Internal Medicine   Why:  As needed   Contact information:   [address] [place] [address] [phone]       Follow up with [facility] EMERGENCY DEPARTMENT.   Specialty:  Emergency Medicine   Why:  If symptoms worsen   Contact information:   [address] [id number] [place] [address] [phone]       The results of significant diagnostics from this hospitalization (including imaging, microbiology, ancillary and laboratory) are listed below for reference.    Significant Diagnostic Studies: Dg Chest Portable 1 View  10/10/2015  CLINICAL DATA:  Productive cough for several weeks which worsened tonight. Initial encounter. EXAM: PORTABLE CHEST 1 VIEW COMPARISON:  Plain films of the chest  09/10/2015, 08/13/2015 and 01/15/2015. CT chest 04/25/2013. FINDINGS: Lung volumes are lower than on the comparison plain films of the chest. Chronic prominence of pulmonary interstitium with nodularity identified and bronchiectasis best seen in lingula are again seen. Given differences in lung volumes, the appearance of the chest is not markedly changed. Heart size is upper normal. No pneumothorax. IMPRESSION: No acute disease. Chronic reticulonodular pattern and bronchiectasis do not appear changed. Electronically Signed   By: Drusilla Kanner M.D.   On: 10/10/2015 23:12    Microbiology: Recent Results (  from the past 240 hour(s))  Blood culture (routine x 2)     Status: None (Preliminary result)   Collection Time: 10/10/15 10:45 PM  Result Value Ref Range Status   Specimen Description BLOOD RIGHT FOREARM  Final   Special Requests   Final    BOTTLES DRAWN AEROBIC AND ANAEROBIC AEB=10CC ANA=4CC   Culture NO GROWTH 2 DAYS  Final   Report Status PENDING  Incomplete  Blood culture (routine x 2)     Status: None (Preliminary result)   Collection Time: 10/10/15 11:00 PM  Result Value Ref Range Status   Specimen Description BLOOD LEFT FOREARM  Final   Special Requests   Final    BOTTLES DRAWN AEROBIC AND ANAEROBIC AEB=10CC ANA=7CC   Culture NO GROWTH 2 DAYS  Final   Report Status PENDING  Incomplete     Labs: Basic Metabolic Panel:  Recent Labs Lab 10/10/15 2240 10/11/15 0540 10/11/15 1100 10/12/15 0627  NA 136 137  --  142  K 3.2* 2.9*  --  3.9  CL 93* 97*  --  107  CO2 29 28  --  30  GLUCOSE 130* 135*  --  93  BUN 28* 25*  --  16  CREATININE 1.26* 1.09*  --  0.93  CALCIUM 9.1 8.4*  --  8.4*  MG  --   --  1.6*  --    Liver Function Tests:  Recent Labs Lab 10/10/15 2240  AST 26  ALT 16  ALKPHOS 77  BILITOT 1.6*  PROT 7.3  ALBUMIN 3.6   No results for input(s): LIPASE, AMYLASE in the last 168 hours. No results for input(s): AMMONIA in the last 168  hours. CBC:  Recent Labs Lab 10/10/15 2240 10/11/15 0540 10/12/15 0627  WBC 17.0* 13.9* 9.3  NEUTROABS 13.2*  --   --   HGB 13.5 11.9* 11.5*  HCT 40.7 36.4 35.9*  MCV 97.6 97.6 99.7  PLT 257 217 201   Cardiac Enzymes:  Recent Labs Lab 10/10/15 2240  TROPONINI <0.03   BNP: BNP (last 3 results)  Recent Labs  10/10/15 2240  BNP 92.0    ProBNP (last 3 results) No results for input(s): PROBNP in the last 8760 hours.  CBG: No results for input(s): GLUCAP in the last 168 hours.     SignedChaya Jan  [facility] Pager: [phone] 10/12/2015, 3:31 PM

## 2015-10-12 NOTE — Care Management Important Message (Signed)
Important Message  Patient Details  Name: Stacie Wright MRN: [id number] Date of Birth: [birth year]   Medicare Important Message Given:  Yes    Adonis Huguenin, RN 10/12/2015, 11:06 AM

## 2015-10-13 NOTE — Progress Notes (Signed)
Late Entry for Discharge note: 10/12/2015  Patient discharged with instructions, prescription, and care notes.  Verbalized understanding via teach back.  IV was removed and the site was WNL. Patient voiced no further complaints or concerns at the time of discharge.  Appointments scheduled per instructions.  Patient left the floor via w/c with staff and family in stable condition.

## 2015-10-15 LAB — CULTURE, BLOOD (ROUTINE X 2)
CULTURE: NO GROWTH
CULTURE: NO GROWTH

## 2015-10-25 ENCOUNTER — Telehealth: Payer: Self-pay | Admitting: *Deleted

## 2015-10-25 NOTE — Telephone Encounter (Signed)
Nannette from [facility] called. The lab is unable to get S/S on AFB collected 06/22/15, they are unable to get the sample to grow.  Nannette will contact the lab on Monday for more information, will update [place].  [phone]. Andree Coss, RN

## 2015-10-26 NOTE — Telephone Encounter (Signed)
Very good 

## 2015-11-01 ENCOUNTER — Ambulatory Visit (INDEPENDENT_AMBULATORY_CARE_PROVIDER_SITE_OTHER): Payer: Medicare HMO | Admitting: Internal Medicine

## 2015-11-01 ENCOUNTER — Encounter: Payer: Self-pay | Admitting: Internal Medicine

## 2015-11-01 VITALS — BP 160/78 | HR 74 | Ht 68.0 in | Wt 157.8 lb

## 2015-11-01 DIAGNOSIS — J479 Bronchiectasis, uncomplicated: Secondary | ICD-10-CM | POA: Insufficient documentation

## 2015-11-01 DIAGNOSIS — J471 Bronchiectasis with (acute) exacerbation: Secondary | ICD-10-CM | POA: Diagnosis not present

## 2015-11-01 MED ORDER — FLUTTER DEVI: Status: DC

## 2015-11-01 MED ORDER — BISOPROLOL FUMARATE 5 MG PO TABS: 5.0000 mg | ORAL_TABLET | Freq: Every day | ORAL | Status: DC

## 2015-11-01 NOTE — Progress Notes (Signed)
Subjective:    Patient ID: Stacie Wright, female    DOB: 12/07/1927,    MRN: [id number]  HPI  89 yowm never smoker with new onset sob earlier 2000s much worse x 2015 >   Admit date: 10/10/2015 Discharge date: 10/12/2015  Time spent: 45 minutes  Recommendations for Outpatient Follow-up:  -We discharge him today. -Advised to follow-up with primary care provider in 2 weeks.  Discharge Diagnoses:  Principal Problem:  Bronchiectasis (HCC) Active Problems:  Hypertension  COPD exacerbation (HCC)  Mycobacterium avium-intracellulare infection (HCC)  Dementia in Alzheimer's disease  Acute respiratory failure with hypoxia (HCC)  Acute exacerbation of bronchiectasis (HCC)   Discharge Condition: Stable and improved  Filed Weights   10/10/15 2117 10/11/15 0524  Weight: 71.668 kg (158 lb) 70.943 kg (156 lb 6.4 oz)    History of present illness:  As per Dr. Lovell Sheehan on 1/26: Stacie Wright is a 82 y.o. female with a history of Bronchiectasis, Recent diagnosis of MAI, HTN, Gout, and Dementia who presented to the ED with complaints of worsening SOB and Cough. Her husband given the history and reports that she had had paroxysms of coughing so bad that she can not catch her breath. She has also been coughing up more greenish mucus. She was found to have decreased O2 sats into the mid-80's and was placed on 3 liters of NCO2.   Of Note: She had been diagnosed with MAI and was placed on Rx of Azithromycin, Ethambutol, and Rifampin by Infectious Diseases Specialist Dr Daiva Eves 1 week ago.   Hospital Course:   Cough, shortness of breath -Improved, likely related to her diagnosis of pulmonary MAI. -Do not believe further antibiotics are required at this point.  -We'll continue triple therapy for MAI with azithromycin, ethambutol and rifampin.  Rest of chronic medical issues remain stable and home medications have not changed.       11/01/2015  f/u ov/Wert re:  doe x across the room / noct cough / wheeze on brovana 2 amps at lunch  Chief Complaint  Patient presents with  . Pulmonary Consult    Referred by Dr. Catalina Pizza. Pt states dxed with COPD and MAC 2 yrs ago. Pt c/o cough x 2 wks- prod with large amounts of green sputum.    has improved since last admit and rx by [facility] for MAI and prev not able to lie back comfortably now sleeps most nights ok on 2 pillows Pt very shaky on details of care and pt's husband not much better, confused at to dosing with brovana and also using saba neb bid not prn.  No obvious other patterns in day to day or daytime variabilty or assoc cp or chest tightness, subjective wheeze overt sinus or hb symptoms. No unusual exp hx or h/o childhood pna/ asthma or knowledge of premature birth.  Sleeping ok without nocturnal  or early am exacerbation  of respiratory  c/o's or need for noct saba. Also denies any obvious fluctuation of symptoms with weather or environmental changes or other aggravating or alleviating factors except as outlined above   Current Medications, Allergies, Complete Past Medical History, Past Surgical History, Family History, and Social History were reviewed in [employer] record.           Review of Systems  Constitutional: Negative for fever, chills and unexpected weight change.  HENT: Positive for dental problem and trouble swallowing. Negative for congestion, ear pain, nosebleeds, postnasal drip, rhinorrhea, sinus pressure, sneezing,  sore throat and voice change.   Eyes: Negative for visual disturbance.  Respiratory: Positive for cough and shortness of breath. Negative for choking.   Cardiovascular: Negative for chest pain and leg swelling.  Gastrointestinal: Negative for vomiting, abdominal pain and diarrhea.  Genitourinary: Negative for difficulty urinating.  Musculoskeletal: Positive for arthralgias.  Skin: Negative for rash.  Neurological: Negative for tremors, syncope and  headaches.  Hematological: Does not bruise/bleed easily.       Objective:   Physical Exam  Frail elderly amb wf nad/ harsh cough / lets fm do her talking   Wt Readings from Last 3 Encounters:  11/01/15 157 lb 12.8 oz (71.578 kg)  10/11/15 156 lb 6.4 oz (70.943 kg)  10/03/15 156 lb 4 oz (70.875 kg)    Vital signs reviewed  HEENT:  Very poor dentition, turbinates, and oropharynx. Nl external ear canals without cough reflex   NECK :  without JVD/Nodes/TM/ nl carotid upstrokes bilaterally   LUNGS: no acc muscle use,  Nl contour chest with bilateral junky exp ronchi    CV:  RRR  no s3 or murmur or increase in P2, no edema   ABD:  soft and nontender with nl inspiratory excursion in the supine position. No bruits or organomegaly, bowel sounds nl  MS:  Nl gait/ ext warm without deformities, calf tenderness, cyanosis or clubbing No obvious joint restrictions   SKIN: warm and dry without lesions    NEURO:  alert, approp, nl sensorium with  no motor deficits     I personally reviewed images and agree with radiology impression as follows:  CXR:  10/10/15 No acute disease. Chronic reticulonodular pattern and bronchiectasis do not appear changed.           Assessment & Plan:

## 2015-11-01 NOTE — Patient Instructions (Addendum)
Brovana one each am and then again about 12 hours later until you see Dr Margo Aye to discuss alternatives   Only use your albuterol as a rescue medication to be used if you can't catch your breath by resting or doing a relaxed purse lip breathing pattern.  - The less you use it, the better it will work when you need it. - ok to use up to every 4 hours as needed but don't use it unless you really need it   Stop tenormin(atenolol)  and start bisoprolol 5 mg daily.  Any time you cough, cough into the flutter valve   I will share with Dr Margo Aye further recommendations about your eye medications and your inhalers (grote)

## 2015-11-02 ENCOUNTER — Encounter: Payer: Self-pay | Admitting: Internal Medicine

## 2015-11-02 ENCOUNTER — Institutional Professional Consult (permissible substitution): Payer: Self-pay | Admitting: Internal Medicine

## 2015-11-02 NOTE — Assessment & Plan Note (Signed)
CT a chest 04/25/13 1. Negative for pulmonary embolism. 2. Overall findings worrisome for multifocal infection, worse within the lingula and right middle lobe, with associated consolidative opacities and areas of bronchiectasis - as such, atypical etiologies (such as MAC) should be considered. 3. Extensive basilar predominant tree in bud opacities are worrisome for airways disseminated infection and/or aspiration - DgEsophagram  11/20/14  Neg asp  - 11/01/2015  Walked RA x 3 laps @ 185 ft each stopped due to  End of study, slow pace Sat still 89% at end   The MAI has been adequately addressed by MAI and she is doing better but her airflow and mucociliary function may benefit from more aggressive rx   1)  Ideally bovana plus ICS in low doses eg budesonide 0.25 mg bid would be a good chronic rx but apparently they don't use a dme company for the neb equipment and both of these need to be purchased through part B medicare which is typically done thru the dme  2) If can't afford plan #1 than BREO 100 one puff each am would do   3) would not use LAMA here  4) instructed on Flutter Valve rx  5) no need for ambulatory 02 at this time   6) pulmonary f/u can be prn   Total time devoted to counseling  = 35/78m review case with pt/ discussion of options/alternatives/ personally creating in presence of pt  then going over specific  Instructions directly with the pt including how to use all of the meds but in particular covering each new medication in detail (see avs)

## 2015-11-06 ENCOUNTER — Other Ambulatory Visit: Payer: Self-pay | Admitting: *Deleted

## 2015-11-06 MED ORDER — AZITHROMYCIN 500 MG PO TABS: 500.0000 mg | ORAL_TABLET | ORAL | Status: DC

## 2015-11-16 ENCOUNTER — Institutional Professional Consult (permissible substitution): Payer: Self-pay | Admitting: Internal Medicine

## 2015-11-20 ENCOUNTER — Encounter: Payer: Self-pay | Admitting: *Deleted

## 2015-11-20 NOTE — Telephone Encounter (Signed)
Error

## 2015-11-23 LAB — AFB CULTURE WITH SMEAR (NOT AT ARMC): Acid Fast Smear: NONE SEEN

## 2015-11-26 ENCOUNTER — Encounter (HOSPITAL_COMMUNITY): Payer: Self-pay | Admitting: Emergency Medicine

## 2015-11-26 ENCOUNTER — Emergency Department (HOSPITAL_COMMUNITY)
Admission: EM | Admit: 2015-11-26 | Discharge: 2015-11-26 | Disposition: A | Discharge status: Home / Self Care | Payer: Medicare HMO | Attending: Emergency Medicine | Admitting: Emergency Medicine

## 2015-11-26 ENCOUNTER — Emergency Department (HOSPITAL_COMMUNITY): Payer: Medicare HMO

## 2015-11-26 DIAGNOSIS — G309 Alzheimer's disease, unspecified: Secondary | ICD-10-CM | POA: Insufficient documentation

## 2015-11-26 DIAGNOSIS — Z792 Long term (current) use of antibiotics: Secondary | ICD-10-CM | POA: Diagnosis not present

## 2015-11-26 DIAGNOSIS — J449 Chronic obstructive pulmonary disease, unspecified: Secondary | ICD-10-CM | POA: Diagnosis not present

## 2015-11-26 DIAGNOSIS — J189 Pneumonia, unspecified organism: Secondary | ICD-10-CM | POA: Insufficient documentation

## 2015-11-26 DIAGNOSIS — R0602 Shortness of breath: Secondary | ICD-10-CM | POA: Diagnosis present

## 2015-11-26 DIAGNOSIS — I10 Essential (primary) hypertension: Secondary | ICD-10-CM | POA: Insufficient documentation

## 2015-11-26 DIAGNOSIS — Z79899 Other long term (current) drug therapy: Secondary | ICD-10-CM | POA: Insufficient documentation

## 2015-11-26 LAB — CBC WITH DIFFERENTIAL/PLATELET
BASOS PCT: 0 %
Basophils Absolute: 0 10*3/uL (ref 0.0–0.1)
EOS ABS: 0 10*3/uL (ref 0.0–0.7)
Eosinophils Relative: 0 %
HCT: 35.9 % — ABNORMAL LOW (ref 36.0–46.0)
HEMOGLOBIN: 11.7 g/dL — AB (ref 12.0–15.0)
Lymphocytes Relative: 10 %
Lymphs Abs: 1.3 10*3/uL (ref 0.7–4.0)
MCH: 32 pg (ref 26.0–34.0)
MCHC: 32.6 g/dL (ref 30.0–36.0)
MCV: 98.1 fL (ref 78.0–100.0)
MONOS PCT: 9 %
Monocytes Absolute: 1.2 10*3/uL — ABNORMAL HIGH (ref 0.1–1.0)
NEUTROS PCT: 81 %
Neutro Abs: 10.6 10*3/uL — ABNORMAL HIGH (ref 1.7–7.7)
PLATELETS: 196 10*3/uL (ref 150–400)
RBC: 3.66 MIL/uL — ABNORMAL LOW (ref 3.87–5.11)
RDW: 14.3 % (ref 11.5–15.5)
WBC: 13.1 10*3/uL — ABNORMAL HIGH (ref 4.0–10.5)

## 2015-11-26 LAB — BASIC METABOLIC PANEL
Anion gap: 7 (ref 5–15)
BUN: 19 mg/dL (ref 6–20)
CALCIUM: 8.4 mg/dL — AB (ref 8.9–10.3)
CO2: 28 mmol/L (ref 22–32)
CREATININE: 0.91 mg/dL (ref 0.44–1.00)
Chloride: 104 mmol/L (ref 101–111)
GFR calc non Af Amer: 56 mL/min — ABNORMAL LOW (ref >60)
Glucose, Bld: 122 mg/dL — ABNORMAL HIGH (ref 65–99)
Potassium: 3.1 mmol/L — ABNORMAL LOW (ref 3.5–5.1)
SODIUM: 139 mmol/L (ref 135–145)

## 2015-11-26 MED ORDER — IPRATROPIUM BROMIDE 0.02 % IN SOLN
0.5000 mg | Freq: Once | RESPIRATORY_TRACT | Status: AC
  Administered 2015-11-26: 0.5 mg via RESPIRATORY_TRACT
  Filled 2015-11-26: qty 2.5

## 2015-11-26 MED ORDER — ALBUTEROL SULFATE (2.5 MG/3ML) 0.083% IN NEBU
5.0000 mg | INHALATION_SOLUTION | Freq: Once | RESPIRATORY_TRACT | Status: AC
  Administered 2015-11-26: 5 mg via RESPIRATORY_TRACT
  Filled 2015-11-26: qty 6

## 2015-11-26 MED ORDER — LEVOFLOXACIN IN D5W 750 MG/150ML IV SOLN
750.0000 mg | Freq: Once | INTRAVENOUS | Status: AC
  Administered 2015-11-26: 750 mg via INTRAVENOUS
  Filled 2015-11-26: qty 150

## 2015-11-26 MED ORDER — POTASSIUM CHLORIDE 20 MEQ/15ML (10%) PO SOLN
40.0000 meq | Freq: Once | ORAL | Status: AC
  Administered 2015-11-26: 40 meq via ORAL
  Filled 2015-11-26: qty 30

## 2015-11-26 MED ORDER — LEVOFLOXACIN 500 MG PO TABS: 500.0000 mg | ORAL_TABLET | Freq: Every day | ORAL | Status: DC

## 2015-11-26 MED ORDER — SODIUM CHLORIDE 0.9 % IV BOLUS (SEPSIS)
500.0000 mL | Freq: Once | INTRAVENOUS | Status: AC
  Administered 2015-11-26: 500 mL via INTRAVENOUS

## 2015-11-26 NOTE — ED Notes (Signed)
EMS called to house by pt's husband for shortness of breath and coughing. Pt here and in no distress. Pt with Alzheimer's Dementia and is mad to be here.

## 2015-11-26 NOTE — ED Notes (Signed)
Pt tolerated ambulation with steady gait and o2 sats at 93% on room air.

## 2015-11-26 NOTE — ED Provider Notes (Signed)
CSN: [id number]     Arrival date & time 11/26/15  0346 History   First MD Initiated Contact with Patient 11/26/15 0415     Chief Complaint  Patient presents with  . Shortness of Breath   Level V caveat for dementia  (Consider location/radiation/quality/duration/timing/severity/associated sxs/prior Treatment) HPI  patient states she has had a cough for a little while. She denies any chest pain but states she feels short of breath at times. She states that her husband gave her a nebulizer treatment at home today because she was wheezing.  Past Medical History  Diagnosis Date  . Hypertension   . COPD (chronic obstructive pulmonary disease) (HCC)   . Anxiety   . Shortness of breath   . Pneumonia 04/2013  . Headache(784.0)   . Arthritis   . MAI (mycobacterium avium-intracellulare) infection (HCC) 2014  . Bronchiectasis (HCC) 2014  . IBS (irritable bowel syndrome)   . Gout   . DVT (deep venous thrombosis) (HCC) 2008    LLE  . Mold exposure 06/20/2015   Past Surgical History  Procedure Laterality Date  . Abdominal hysterectomy    . Cholecystectomy    . Fracture surgery    . Cataract extraction     Family History  Problem Relation Age of Onset  . Diabetes Father   . Hypertension Father   . Lung cancer Sister     smoked  . Rheum arthritis Father    Social History  Substance Use Topics  . Smoking status: Never Smoker   . Smokeless tobacco: Never Used  . Alcohol Use: No   Lives with spouse  OB History    No data available     Review of Systems  All other systems reviewed and are negative.     Allergies  Amoxicillin; Penicillins; Sulfa antibiotics; and Darvocet  Home Medications   Prior to Admission medications   Medication Sig Start Date End Date Taking? Authorizing Provider  albuterol (PROVENTIL) (2.5 MG/3ML) 0.083% nebulizer solution Take 2.5 mg by nebulization 2 (two) times daily.    Historical Provider, MD  allopurinol (ZYLOPRIM) 300 MG tablet Take 300 mg  by mouth daily.    Historical Provider, MD  arformoterol (BROVANA) 15 MCG/2ML NEBU Take 15 mcg by nebulization daily.    Historical Provider, MD  aspirin EC 81 MG tablet Take 81 mg by mouth daily.    Historical Provider, MD  azithromycin (ZITHROMAX) 500 MG tablet Take 1 tablet (500 mg total) by mouth 3 (three) times a week. 11/06/15   Randall Hiss, MD  beta carotene w/minerals (OCUVITE) tablet Take 1 tablet by mouth daily.    Historical Provider, MD  bisoprolol (ZEBETA) 5 MG tablet Take 1 tablet (5 mg total) by mouth daily. 11/01/15   Nyoka Cowden, MD  brimonidine-timolol (COMBIGAN) 0.2-0.5 % ophthalmic solution Place 1 drop into both eyes every 12 (twelve) hours.    Historical Provider, MD  dorzolamide (TRUSOPT) 2 % ophthalmic solution Place 1 drop into both eyes 2 (two) times daily.    Historical Provider, MD  ethambutol (MYAMBUTOL) 400 MG tablet Take 4 tablets (1,600 mg total) by mouth 3 (three) times a week. 10/03/15   Randall Hiss, MD  guaiFENesin-codeine 100-10 MG/5ML syrup Take 10 mLs by mouth every 6 (six) hours as needed for cough. 01/19/15   Kari Baars, MD  hyoscyamine (LEVBID) 0.375 MG 12 hr tablet Take 0.1875 mg by mouth every morning.     Historical Provider, MD  latanoprost Harrel Lemon)  0.005 % ophthalmic solution Place 1 drop into both eyes at bedtime.    Historical Provider, MD  levofloxacin (LEVAQUIN) 500 MG tablet Take 1 tablet (500 mg total) by mouth daily. 11/26/15   Devoria AlbeIva Rever Pichette, MD  PARoxetine (PAXIL) 20 MG tablet Take 20 mg by mouth every morning.    Historical Provider, MD  Respiratory Therapy Supplies (FLUTTER) DEVI Use as directed 11/01/15   Nyoka CowdenMichael B Wert, MD  rifampin (RIFADIN) 300 MG capsule Take 2 capsules (600 mg total) by mouth 3 (three) times a week. 10/03/15   Randall Hissornelius N Van Dam, MD  triamterene-hydrochlorothiazide (MAXZIDE-25) 37.5-25 MG per tablet Take 1 tablet by mouth every other day.    Historical Provider, MD   BP 166/65 mmHg  Pulse 102  Temp(Src)  100.9 F (38.3 C) (Oral)  Resp 20  SpO2 90%  Vital signs normal except for tachycardia, fever, and borderline oxygen   Physical Exam  Constitutional: She appears well-developed and well-nourished.  Non-toxic appearance. She does not appear ill. No distress.  HENT:  Head: Normocephalic and atraumatic.  Right Ear: External ear normal.  Left Ear: External ear normal.  Nose: Nose normal. No mucosal edema or rhinorrhea.  Mouth/Throat: Oropharynx is clear and moist and mucous membranes are normal. No dental abscesses or uvula swelling.  Eyes: Conjunctivae and EOM are normal. Pupils are equal, round, and reactive to light.  Neck: Normal range of motion and full passive range of motion without pain. Neck supple.  Cardiovascular: Normal rate, regular rhythm and normal heart sounds.  Exam reveals no gallop and no friction rub.   No murmur heard. Pulmonary/Chest: Effort normal and breath sounds normal. No respiratory distress. She has no wheezes. She has no rhonchi. She has no rales. She exhibits no tenderness and no crepitus.  Coughing at times, she has rales at the bases bilaterally and very faint rare late end expiratory wheezing  Abdominal: Soft. Normal appearance and bowel sounds are normal. She exhibits no distension. There is no tenderness. There is no rebound and no guarding.  Musculoskeletal: Normal range of motion. She exhibits no edema or tenderness.  Moves all extremities well.   Neurological: She is alert. She has normal strength. No cranial nerve deficit.  Skin: Skin is warm, dry and intact. No rash noted. No erythema. No pallor.  Psychiatric: She has a normal mood and affect. Her speech is normal and behavior is normal. Her mood appears not anxious.  Nursing note and vitals reviewed.   ED Course  Procedures (including critical care time)  Medications  levofloxacin (LEVAQUIN) IVPB 750 mg (750 mg Intravenous New Bag/Given 11/26/15 0650)  potassium chloride 20 MEQ/15ML (10%)  solution 40 mEq (not administered)  albuterol (PROVENTIL) (2.5 MG/3ML) 0.083% nebulizer solution 5 mg (5 mg Nebulization Given 11/26/15 0439)  ipratropium (ATROVENT) nebulizer solution 0.5 mg (0.5 mg Nebulization Given 11/26/15 0439)  sodium chloride 0.9 % bolus 500 mL (500 mLs Intravenous New Bag/Given 11/26/15 [id number])    Husband states she awakened him tonight and he could hear her breathing across the house. He states she was gasping for air and wheezing. He states he turned on a humidifier which did not seem to help. At that point he called EMS. He reports she's been getting both verbally aggressive to him, however in the course of our conversation husband confronts patient about her being wrong in what she thinks is happening. We discussed that this will only make her get angry which he agrees it does. He was advised just  to agree with her and just postpone doing something that doesn't make sense. He states he's going to follow-up with her PCP, Dr. Margo Aye today to discuss placing her in a memory care unit. He was offered to him to have her evaluated and possibly admitted to [facility] to adjust her medications however he states that is too far away and he prefers to follow-up with Dr. Margo Aye.  Patient has a history of MAI with Mycobacterium avium-intracellulare infection. He states since she started on the azithromycin, ethambutol, and rifampin 3 times a week her coughing has improved. He states however the past week her cough seems to be getting worse at night. She is being followed by Dr. Zenaida Niece dam of infectious disease and Dr. Sherene Sires of pulmonary. She was last seen by Dr. Sherene Sires about 3 weeks ago. At that time he ambulated her not in his office and her pulse ox dropped to 89% on room air however he did not feel like she needed oxygen at home.  7:45 AM patient was ambulated by nursing staff and she was able to walk with a steady gait and her pulse ox was 93% on room air. At this point the patient can be  discharged home on oral antibiotics which will make her happy.  7:50 AM patient was given oral potassium for her hypokalemia.  Labs Review Results for orders placed or performed during the hospital encounter of 11/26/15  Basic metabolic panel  Result Value Ref Range   Sodium 139 135 - 145 mmol/L   Potassium 3.1 (L) 3.5 - 5.1 mmol/L   Chloride 104 101 - 111 mmol/L   CO2 28 22 - 32 mmol/L   Glucose, Bld 122 (H) 65 - 99 mg/dL   BUN 19 6 - 20 mg/dL   Creatinine, Ser 1.61 0.44 - 1.00 mg/dL   Calcium 8.4 (L) 8.9 - 10.3 mg/dL   GFR calc non Af Amer 56 (L) >60 mL/min   GFR calc Af Amer >60 >60 mL/min   Anion gap 7 5 - 15  CBC with Differential  Result Value Ref Range   WBC 13.1 (H) 4.0 - 10.5 K/uL   RBC 3.66 (L) 3.87 - 5.11 MIL/uL   Hemoglobin 11.7 (L) 12.0 - 15.0 g/dL   HCT 09.6 (L) 04.5 - 40.9 %   MCV 98.1 78.0 - 100.0 fL   MCH 32.0 26.0 - 34.0 pg   MCHC 32.6 30.0 - 36.0 g/dL   RDW 81.1 91.4 - 78.2 %   Platelets 196 150 - 400 K/uL   Neutrophils Relative % 81 %   Neutro Abs 10.6 (H) 1.7 - 7.7 K/uL   Lymphocytes Relative 10 %   Lymphs Abs 1.3 0.7 - 4.0 K/uL   Monocytes Relative 9 %   Monocytes Absolute 1.2 (H) 0.1 - 1.0 K/uL   Eosinophils Relative 0 %   Eosinophils Absolute 0.0 0.0 - 0.7 K/uL   Basophils Relative 0 %   Basophils Absolute 0.0 0.0 - 0.1 K/uL   Laboratory interpretation all normal except leukocytosis and mild anemia, hypokalemia    Imaging Review Dg Chest 2 View  11/26/2015  CLINICAL DATA:  Shortness of breath and cough.  MAC infection EXAM: CHEST  2 VIEW COMPARISON:  10/10/2015 FINDINGS: Chronic lung disease with bronchiectasis, scarring, and nodular airspace disease. The overall pattern is similar to priors but interstitial coarsening and opacity is increased. No edema, effusion, or air leak. Normal heart size and aortic contours. Chronic blunting of the posterior left costophrenic sulcus.  IMPRESSION: Chronic lung disease related to nontuberculous mycobacterium.  Interstitial coarsening is progressed suggesting acute bronchitis or bronchopneumonia. Electronically Signed   By: Marnee Spring M.D.   On: 11/26/2015 05:48   I have personally reviewed and evaluated these images and lab results as part of my medical decision-making.    MDM   Final diagnoses:  CAP (community acquired pneumonia)    New Prescriptions   LEVOFLOXACIN (LEVAQUIN) 500 MG TABLET    Take 1 tablet (500 mg total) by mouth daily.    Plan discharge  Devoria Albe, MD, Concha Pyo, MD 11/26/15 [phone]

## 2015-11-26 NOTE — Discharge Instructions (Signed)
Take Mucinex DM over-the-counter for cough. Take antibiotic until gone. Monitor her for fever, give her acetaminophen 650 mg every 6 hours as needed for fever. Please follow through with Dr. Margo AyeHall about your concerns about her living situation. You should also let Dr. Daiva EvesVan Dam know about her worsening cough and possible pneumonia on her x-ray.  Return to the ER she seems worse.

## 2015-11-30 ENCOUNTER — Emergency Department (HOSPITAL_COMMUNITY)
Admission: EM | Admit: 2015-11-30 | Discharge: 2015-12-01 | Disposition: A | Discharge status: Other Acute Inpatient Hospital | Payer: Medicare HMO | Attending: Emergency Medicine | Admitting: Emergency Medicine

## 2015-11-30 ENCOUNTER — Encounter (HOSPITAL_COMMUNITY): Payer: Self-pay | Admitting: *Deleted

## 2015-11-30 ENCOUNTER — Emergency Department (HOSPITAL_COMMUNITY): Payer: Medicare HMO

## 2015-11-30 DIAGNOSIS — J449 Chronic obstructive pulmonary disease, unspecified: Secondary | ICD-10-CM | POA: Insufficient documentation

## 2015-11-30 DIAGNOSIS — Z8701 Personal history of pneumonia (recurrent): Secondary | ICD-10-CM | POA: Insufficient documentation

## 2015-11-30 DIAGNOSIS — F0281 Dementia in other diseases classified elsewhere with behavioral disturbance: Secondary | ICD-10-CM | POA: Insufficient documentation

## 2015-11-30 DIAGNOSIS — I10 Essential (primary) hypertension: Secondary | ICD-10-CM | POA: Diagnosis not present

## 2015-11-30 DIAGNOSIS — F99 Mental disorder, not otherwise specified: Secondary | ICD-10-CM | POA: Diagnosis present

## 2015-11-30 DIAGNOSIS — G309 Alzheimer's disease, unspecified: Secondary | ICD-10-CM | POA: Diagnosis not present

## 2015-11-30 HISTORY — DX: Alzheimer's disease, unspecified: G30.9

## 2015-11-30 HISTORY — DX: Dementia in other diseases classified elsewhere, unspecified severity, without behavioral disturbance, psychotic disturbance, mood disturbance, and anxiety: F02.80

## 2015-11-30 LAB — URINALYSIS, ROUTINE W REFLEX MICROSCOPIC
Bilirubin Urine: NEGATIVE
Glucose, UA: NEGATIVE mg/dL
Ketones, ur: NEGATIVE mg/dL
Leukocytes, UA: NEGATIVE
Nitrite: NEGATIVE
Specific Gravity, Urine: 1.015 (ref 1.005–1.030)
pH: 8 (ref 5.0–8.0)

## 2015-11-30 LAB — ETHANOL: Alcohol, Ethyl (B): <5 mg/dL (ref <5)

## 2015-11-30 LAB — URINE MICROSCOPIC-ADD ON

## 2015-11-30 LAB — COMPREHENSIVE METABOLIC PANEL
ALT: 31 U/L (ref 14–54)
AST: 35 U/L (ref 15–41)
Albumin: 3.7 g/dL (ref 3.5–5.0)
Alkaline Phosphatase: 58 U/L (ref 38–126)
Anion gap: 11 (ref 5–15)
BUN: 15 mg/dL (ref 6–20)
CHLORIDE: 100 mmol/L — AB (ref 101–111)
CO2: 26 mmol/L (ref 22–32)
CREATININE: 0.88 mg/dL (ref 0.44–1.00)
Calcium: 8.9 mg/dL (ref 8.9–10.3)
GFR calc Af Amer: >60 mL/min (ref >60)
GFR, EST NON AFRICAN AMERICAN: 58 mL/min — AB (ref >60)
Glucose, Bld: 117 mg/dL — ABNORMAL HIGH (ref 65–99)
Potassium: 3.3 mmol/L — ABNORMAL LOW (ref 3.5–5.1)
Sodium: 137 mmol/L (ref 135–145)
Total Bilirubin: 1 mg/dL (ref 0.3–1.2)
Total Protein: 8.1 g/dL (ref 6.5–8.1)

## 2015-11-30 LAB — CBC
HCT: 39.1 % (ref 36.0–46.0)
HEMOGLOBIN: 12.9 g/dL (ref 12.0–15.0)
MCH: 31.9 pg (ref 26.0–34.0)
MCHC: 33 g/dL (ref 30.0–36.0)
MCV: 96.8 fL (ref 78.0–100.0)
Platelets: 231 10*3/uL (ref 150–400)
RBC: 4.04 MIL/uL (ref 3.87–5.11)
RDW: 14.1 % (ref 11.5–15.5)
WBC: 9.8 10*3/uL (ref 4.0–10.5)

## 2015-11-30 LAB — RAPID URINE DRUG SCREEN, HOSP PERFORMED
AMPHETAMINES: NOT DETECTED
Barbiturates: NOT DETECTED
Benzodiazepines: NOT DETECTED
Cocaine: NOT DETECTED
Opiates: POSITIVE — AB
TETRAHYDROCANNABINOL: NOT DETECTED

## 2015-11-30 LAB — SALICYLATE LEVEL: Salicylate Lvl: <4 mg/dL (ref 2.8–30.0)

## 2015-11-30 LAB — ACETAMINOPHEN LEVEL: Acetaminophen (Tylenol), Serum: <10 ug/mL — ABNORMAL LOW (ref 10–30)

## 2015-11-30 LAB — TROPONIN I: Troponin I: <0.03 ng/mL (ref <0.031)

## 2015-11-30 MED ORDER — ALBUTEROL SULFATE (2.5 MG/3ML) 0.083% IN NEBU
2.5000 mg | INHALATION_SOLUTION | Freq: Two times a day (BID) | RESPIRATORY_TRACT | Status: DC
  Administered 2015-11-30: 2.5 mg via RESPIRATORY_TRACT
  Filled 2015-11-30 (×2): qty 3

## 2015-11-30 MED ORDER — ARFORMOTEROL TARTRATE 15 MCG/2ML IN NEBU
15.0000 ug | INHALATION_SOLUTION | Freq: Every day | RESPIRATORY_TRACT | Status: DC
  Administered 2015-11-30: 15 ug via RESPIRATORY_TRACT
  Filled 2015-11-30 (×4): qty 2

## 2015-11-30 MED ORDER — ETHAMBUTOL HCL 400 MG PO TABS
1600.0000 mg | ORAL_TABLET | ORAL | Status: DC
  Administered 2015-11-30: 1600 mg via ORAL
  Filled 2015-11-30 (×2): qty 4

## 2015-11-30 MED ORDER — BISOPROLOL FUMARATE 5 MG PO TABS
5.0000 mg | ORAL_TABLET | Freq: Every day | ORAL | Status: DC
  Administered 2015-11-30: 5 mg via ORAL
  Filled 2015-11-30 (×4): qty 1

## 2015-11-30 MED ORDER — POTASSIUM CHLORIDE CRYS ER 20 MEQ PO TBCR
40.0000 meq | EXTENDED_RELEASE_TABLET | Freq: Once | ORAL | Status: AC
  Administered 2015-11-30: 40 meq via ORAL
  Filled 2015-11-30: qty 2

## 2015-11-30 MED ORDER — ASPIRIN EC 81 MG PO TBEC
81.0000 mg | DELAYED_RELEASE_TABLET | Freq: Every day | ORAL | Status: DC
Start: 2015-11-30 — End: 2015-12-01
  Administered 2015-11-30: 81 mg via ORAL
  Filled 2015-11-30: qty 1

## 2015-11-30 MED ORDER — DORZOLAMIDE HCL 2 % OP SOLN
1.0000 [drp] | Freq: Two times a day (BID) | OPHTHALMIC | Status: DC
  Administered 2015-11-30: 1 [drp] via OPHTHALMIC
  Filled 2015-11-30: qty 10

## 2015-11-30 MED ORDER — TIMOLOL MALEATE 0.5 % OP SOLN
1.0000 [drp] | Freq: Two times a day (BID) | OPHTHALMIC | Status: DC
  Administered 2015-11-30: 1 [drp] via OPHTHALMIC
  Filled 2015-11-30: qty 5

## 2015-11-30 MED ORDER — PAROXETINE HCL 20 MG PO TABS
20.0000 mg | ORAL_TABLET | ORAL | Status: DC
  Filled 2015-11-30 (×3): qty 1

## 2015-11-30 MED ORDER — HYOSCYAMINE SULFATE ER 0.375 MG PO TB12: 0.1875 mg | ORAL_TABLET | Freq: Every morning | ORAL | Status: DC

## 2015-11-30 MED ORDER — ALLOPURINOL 300 MG PO TABS
300.0000 mg | ORAL_TABLET | Freq: Every day | ORAL | Status: DC
  Administered 2015-11-30: 300 mg via ORAL
  Filled 2015-11-30 (×4): qty 1

## 2015-11-30 MED ORDER — AZITHROMYCIN 250 MG PO TABS
500.0000 mg | ORAL_TABLET | ORAL | Status: DC
  Administered 2015-11-30: 500 mg via ORAL
  Filled 2015-11-30: qty 2

## 2015-11-30 MED ORDER — RIFAMPIN 300 MG PO CAPS
600.0000 mg | ORAL_CAPSULE | ORAL | Status: DC
  Administered 2015-11-30: 600 mg via ORAL
  Filled 2015-11-30 (×2): qty 2

## 2015-11-30 MED ORDER — HYOSCYAMINE SULFATE 0.125 MG SL SUBL
0.1250 mg | SUBLINGUAL_TABLET | Freq: Every day | SUBLINGUAL | Status: DC
  Administered 2015-11-30: 0.125 mg via SUBLINGUAL
  Filled 2015-11-30: qty 1

## 2015-11-30 MED ORDER — LORAZEPAM 0.5 MG PO TABS
0.5000 mg | ORAL_TABLET | Freq: Once | ORAL | Status: AC
  Administered 2015-11-30: 0.5 mg via ORAL
  Filled 2015-11-30: qty 1

## 2015-11-30 MED ORDER — BRIMONIDINE TARTRATE 0.2 % OP SOLN
1.0000 [drp] | Freq: Two times a day (BID) | OPHTHALMIC | Status: DC
  Administered 2015-11-30: 1 [drp] via OPHTHALMIC
  Filled 2015-11-30: qty 5

## 2015-11-30 MED ORDER — TRIAMTERENE-HCTZ 37.5-25 MG PO TABS
1.0000 | ORAL_TABLET | ORAL | Status: DC
Start: 2015-11-30 — End: 2015-12-01
  Administered 2015-11-30: 1 via ORAL
  Filled 2015-11-30 (×2): qty 1

## 2015-11-30 MED ORDER — BRIMONIDINE TARTRATE-TIMOLOL 0.2-0.5 % OP SOLN: 1.0000 [drp] | Freq: Two times a day (BID) | OPHTHALMIC | Status: DC

## 2015-11-30 MED ORDER — LATANOPROST 0.005 % OP SOLN
1.0000 [drp] | Freq: Every day | OPHTHALMIC | Status: DC
  Administered 2015-11-30: 1 [drp] via OPHTHALMIC
  Filled 2015-11-30: qty 2.5

## 2015-11-30 MED ORDER — OCUVITE PO TABS
1.0000 | ORAL_TABLET | Freq: Every day | ORAL | Status: DC
  Administered 2015-11-30: 1 via ORAL
  Filled 2015-11-30 (×4): qty 1

## 2015-11-30 NOTE — ED Notes (Signed)
Family took out IVC paperwork on patient today. States patient was wandering in traffic yesterday and behavior is becoming increasing erractic. States in triage "she doesn't care if she lives anymore". Patient suffers alzheimers.

## 2015-11-30 NOTE — BH Assessment (Signed)
Tele Assessment Note   Stacie Wright is a Caucasian 82 y.o. female with a history of Alzheimer's Disease who presented to the [facility] under IVC for erratic behavior (wandering into the street and into traffic), suicidal threats, and threats of harm against her husband.  IVC was filed by her husband Gene Bumgardner [phone]).  During assessment, Pt had good eye contact and was cooperative and pleasant.  Pt was calm, and there was no report of disruptive behavior at the ED.  Pt's mood was apprehensive and affect was blunted.  Pt was not oriented --- she did not know where she was, the reason for being at the hospital, or the year.  Pt thought she may be at the hospital because of feet issues.  She denied suicidal ideation, homicidal ideation, or hallucination.  She also denied making any threatening statements against her husband or wandering into the street.  There was no evidence of delusion.  Speech was soft and sometimes non-responsive to the question asked.  Pt's memory and concentration were poor.  Insight, judgment and impulsivity were poor (per report).  Pt said that she wanted to go home and could not understand why her husband has not yet come to get her.  Chartered loss adjuster spoke with husband and in-home assistant Pt uses during weekdays.  Husband and assistant reported that Pt's behavior has become increasingly erratic over the last week; that Pt tries to leave the home in the middle of the night and that husband has to sleep in front of the front-door to bar her exit; that yesterday and today, Pt wandered out of the home and into traffic, yelling that she wanted to kill herself and her husband and that her husband wanted to kill her; that husband contacted the sheriff's department today and that they had to force Pt back into the family home to avoid her being hit by traffic.  Consulted with T. Starkes.  Pt appropriate [facility] inpatient treatment at [facility].    Diagnosis: Alzheimer's Disease  Past Medical  History:  Past Medical History  Diagnosis Date  . Hypertension   . COPD (chronic obstructive pulmonary disease) (HCC)   . Anxiety   . Shortness of breath   . Pneumonia 04/2013  . Headache(784.0)   . Arthritis   . MAI (mycobacterium avium-intracellulare) infection (HCC) 2014  . Bronchiectasis (HCC) 2014  . IBS (irritable bowel syndrome)   . Gout   . DVT (deep venous thrombosis) (HCC) 2008    LLE  . Mold exposure 06/20/2015  . Alzheimer's dementia     Past Surgical History  Procedure Laterality Date  . Abdominal hysterectomy    . Cholecystectomy    . Fracture surgery    . Cataract extraction      Family History:  Family History  Problem Relation Age of Onset  . Diabetes Father   . Hypertension Father   . Lung cancer Sister     smoked  . Rheum arthritis Father     Social History:  reports that she has never smoked. She has never used smokeless tobacco. She reports that she does not drink alcohol or use illicit drugs.  Additional Social History:  Alcohol / Drug Use Pain Medications: See PTA Prescriptions: See PTA Over the Counter: See PTA History of alcohol / drug use?: No history of alcohol / drug abuse  CIWA: CIWA-Ar BP: 177/74 mmHg Pulse Rate: 86 COWS:    PATIENT STRENGTHS: (choose at least two) Average or above average intelligence Motivation for  treatment/growth Supportive family/friends  Allergies:  Allergies  Allergen Reactions  . Amoxicillin Anaphylaxis and Rash  . Penicillins Anaphylaxis and Rash    Has patient had a PCN reaction causing immediate rash, facial/tongue/throat swelling, SOB or lightheadedness with hypotension: Yes Has patient had a PCN reaction causing severe rash involving mucus membranes or skin necrosis: No Has patient had a PCN reaction that required hospitalization No Has patient had a PCN reaction occurring within the last 10 years: No If all of the above answers are "NO", then may proceed with Cephalosporin use.   . Sulfa  Antibiotics Anaphylaxis and Rash  . Darvocet [Propoxyphene N-Acetaminophen]     hallucinations    Home Medications:  (Not in a hospital admission)  OB/GYN Status:  No LMP recorded. Patient has had a hysterectomy.  General Assessment Data Location of Assessment: [facility] TTS Assessment: In system Is this a Tele or Face-to-Face Assessment?: Tele Assessment Is this an Initial Assessment or a Re-assessment for this encounter?: Initial Assessment Marital status: Married Is patient pregnant?: No Pregnancy Status: No Living Arrangements: Spouse/significant other (Gene Mcclenton) Can pt return to current living arrangement?: Yes Admission Status: Involuntary Is patient capable of signing voluntary admission?: Yes Referral Source: MD Insurance type: Community education officer Medicare  Medical Screening Exam [facility] Walk-in ONLY) Medical Exam completed: Yes  Crisis Care Plan Living Arrangements: Spouse/significant other (Gene Osier) Name of Psychiatrist: NA  Education Status Is patient currently in school?: No  Risk to self with the past 6 months Suicidal Ideation: No (Pt denied; per report, Pt has expressed desire to die) Has patient been a risk to self within the past 6 months prior to admission? : Other (comment) (Pt denied; per collaterals, Pt has expressed desire to die) Suicidal Intent: No (Pt denied; per collaterals, Pt has expressed intent) Has patient had any suicidal intent within the past 6 months prior to admission? : Other (comment) Is patient at risk for suicide?: Yes (Per collaterals, Pt has threatened to kill self) Suicidal Plan?: No Has patient had any suicidal plan within the past 6 months prior to admission? : No Access to Means: No Previous Attempts/Gestures: No Intentional Self Injurious Behavior: None Family Suicide History: Unknown Persecutory voices/beliefs?: No Depression: No (Pt denied) Depression Symptoms: Feeling angry/irritable Substance abuse history and/or treatment for  substance abuse?: No Suicide prevention information given to non-admitted patients: Not applicable  Risk to Others within the past 6 months Homicidal Ideation: No (Pt denied; collaterals report Pt has threatened kill husband) Does patient have any lifetime risk of violence toward others beyond the six months prior to admission? : No Thoughts of Harm to Others: No (Pt denies; collaterals report Pt threatened kill husband) Current Homicidal Intent: No Current Homicidal Plan: No Access to Homicidal Means: No History of harm to others?: Yes (Pt denied; collaterals indicate Pt threatened kill husband) Assessment of Violence: None Noted Does patient have access to weapons?: No Criminal Charges Pending?: No Does patient have a court date: No Is patient on probation?: No  Psychosis Hallucinations: None noted Delusions: None noted  Mental Status Report Appearance/Hygiene: In scrubs, Unremarkable Eye Contact: Good Motor Activity: Unremarkable Speech: Unremarkable, Soft Level of Consciousness: Quiet/awake Mood: Apprehensive Affect: Blunted Anxiety Level: None Thought Processes: Irrelevant Judgement: Impaired Orientation: Not oriented Obsessive Compulsive Thoughts/Behaviors: None  Cognitive Functioning Concentration: Fair Memory: Recent Impaired, Remote Impaired IQ: Average Insight: Poor Impulse Control: Poor Appetite: Good Sleep: No Change Vegetative Symptoms: None  ADLScreening [facility] Assessment Services) Patient's cognitive ability adequate to safely complete  daily activities?: No Patient able to express need for assistance with ADLs?: Yes Independently performs ADLs?: Yes (appropriate for developmental age)  Prior Inpatient Therapy Prior Inpatient Therapy: No  Prior Outpatient Therapy Prior Outpatient Therapy: No  ADL Screening (condition at time of admission) Patient's cognitive ability adequate to safely complete daily activities?: No Is the patient deaf or have  difficulty hearing?: No Does the patient have difficulty seeing, even when wearing glasses/contacts?: No Does the patient have difficulty concentrating, remembering, or making decisions?: Yes Patient able to express need for assistance with ADLs?: Yes Does the patient have difficulty dressing or bathing?: No Independently performs ADLs?: Yes (appropriate for developmental age)       Abuse/Neglect Assessment (Assessment to be complete while patient is alone) Physical Abuse: Denies Verbal Abuse: Denies Sexual Abuse: Denies Exploitation of patient/patient's resources: Denies Self-Neglect: Yes, present (Comment) (Per report, Pt has Alzheimer's -- receives assistance) Values / Beliefs Cultural Requests During Hospitalization: None Spiritual Requests During Hospitalization: None Consults Spiritual Care Consult Needed: No Social Work Consult Needed: No Merchant navy officerAdvance Directives (For Healthcare) Does patient have an advance directive?: No Would patient like information on creating an advanced directive?: No - patient declined information    Additional Information 1:1 In Past 12 Months?: No CIRT Risk: No Elopement Risk: Yes Does patient have medical clearance?: Yes     Disposition:  Disposition Initial Assessment Completed for this Encounter: Yes Disposition of Patient: Inpatient treatment program Type of inpatient treatment program: Adult (Per T. Starkes, Pt approp. for geri psych)  Dennard NipEugene T Oriel Ojo 11/30/2015 4:11 PM

## 2015-11-30 NOTE — ED Notes (Signed)
Husband called. Updated on pt's status. Pt has aide that will be coming with him in the morning to visit.

## 2015-11-30 NOTE — ED Notes (Signed)
Pt's husband left phone number [phone]. Step daughter's number [phone].

## 2015-11-30 NOTE — Progress Notes (Addendum)
NP T. Starkes reccommended gero-psych inpatient treatment on 3/17.  Patient has [facility] geriatric psych [facility] per Delorise ShinerGrace, beds open. Per Delorise ShinerGrace, please re-fax. [facility] - per [place], geriatric beds open. [facility] - left voicemail and call back number. [facility] - [facility]child [facility]. [facility], [facility], and [facility] - will look at referral. [facility] - accepting referrals. [facility] - per [place], beds open.  At capacity: [facility] - [facility] - per [place]  CSW will continue to seek placement.  Melbourne Abtsatia Emmett Arntz, LCSWA Disposition staff 11/30/2015 4:26 PM

## 2015-11-30 NOTE — ED Provider Notes (Signed)
CSN: [id number]     Arrival date & time 11/30/15  1240 History   First MD Initiated Contact with Patient 11/30/15 1329     Chief Complaint  Patient presents with  . Suicidal      The history is provided by the patient, the spouse and the police. The history is limited by the condition of the patient (hx dementia).  Pt was seen at 1345.  Per Police, pt's husband, IVC paperwork: Pt left home and was wandering around in the street with traffic yesterday. Sheriff was called due to pt sating her husband was trying to kill her, making comments about suicide ("doesn't care if she lives anymore"), as well as threatening to kill her husband. Pt has hx of dementia and "has become increasingly erratic over the past week," per pt's husband. Pt's husband took out IVC paperwork because he feels pt will harm herself or others.   Past Medical History  Diagnosis Date  . Hypertension   . COPD (chronic obstructive pulmonary disease) (HCC)   . Anxiety   . Shortness of breath   . Pneumonia 04/2013  . Headache(784.0)   . Arthritis   . MAI (mycobacterium avium-intracellulare) infection (HCC) 2014  . Bronchiectasis (HCC) 2014  . IBS (irritable bowel syndrome)   . Gout   . DVT (deep venous thrombosis) (HCC) 2008    LLE  . Mold exposure 06/20/2015  . Alzheimer's dementia    Past Surgical History  Procedure Laterality Date  . Abdominal hysterectomy    . Cholecystectomy    . Fracture surgery    . Cataract extraction     Family History  Problem Relation Age of Onset  . Diabetes Father   . Hypertension Father   . Lung cancer Sister     smoked  . Rheum arthritis Father    Social History  Substance Use Topics  . Smoking status: Never Smoker   . Smokeless tobacco: Never Used  . Alcohol Use: No    Review of Systems  Unable to perform ROS: Dementia      Allergies  Amoxicillin; Penicillins; Sulfa antibiotics; and Darvocet  Home Medications   Prior to Admission medications   Medication Sig  Start Date End Date Taking? Authorizing Provider  albuterol (PROVENTIL) (2.5 MG/3ML) 0.083% nebulizer solution Take 2.5 mg by nebulization 2 (two) times daily.    Historical Provider, MD  allopurinol (ZYLOPRIM) 300 MG tablet Take 300 mg by mouth daily.    Historical Provider, MD  arformoterol (BROVANA) 15 MCG/2ML NEBU Take 15 mcg by nebulization daily.    Historical Provider, MD  aspirin EC 81 MG tablet Take 81 mg by mouth daily.    Historical Provider, MD  azithromycin (ZITHROMAX) 500 MG tablet Take 1 tablet (500 mg total) by mouth 3 (three) times a week. 11/06/15   Randall Hiss, MD  beta carotene w/minerals (OCUVITE) tablet Take 1 tablet by mouth daily.    Historical Provider, MD  bisoprolol (ZEBETA) 5 MG tablet Take 1 tablet (5 mg total) by mouth daily. 11/01/15   Nyoka Cowden, MD  brimonidine-timolol (COMBIGAN) 0.2-0.5 % ophthalmic solution Place 1 drop into both eyes every 12 (twelve) hours.    Historical Provider, MD  dorzolamide (TRUSOPT) 2 % ophthalmic solution Place 1 drop into both eyes 2 (two) times daily.    Historical Provider, MD  ethambutol (MYAMBUTOL) 400 MG tablet Take 4 tablets (1,600 mg total) by mouth 3 (three) times a week. 10/03/15   Rich Reining  Dam, MD  guaiFENesin-codeine 100-10 MG/5ML syrup Take 10 mLs by mouth every 6 (six) hours as needed for cough. 01/19/15   Kari Baars, MD  hyoscyamine (LEVBID) 0.375 MG 12 hr tablet Take 0.1875 mg by mouth every morning.     Historical Provider, MD  latanoprost (XALATAN) 0.005 % ophthalmic solution Place 1 drop into both eyes at bedtime.    Historical Provider, MD  levofloxacin (LEVAQUIN) 500 MG tablet Take 1 tablet (500 mg total) by mouth daily. 11/26/15   Devoria Albe, MD  PARoxetine (PAXIL) 20 MG tablet Take 20 mg by mouth every morning.    Historical Provider, MD  Respiratory Therapy Supplies (FLUTTER) DEVI Use as directed 11/01/15   Nyoka Cowden, MD  rifampin (RIFADIN) 300 MG capsule Take 2 capsules (600 mg total) by mouth  3 (three) times a week. 10/03/15   Randall Hiss, MD  triamterene-hydrochlorothiazide (MAXZIDE-25) 37.5-25 MG per tablet Take 1 tablet by mouth every other day.    Historical Provider, MD   BP 177/74 mmHg  Pulse 86  Temp(Src) 98.9 F (37.2 C) (Tympanic)  Resp 20  SpO2 97%   Physical Exam  1345: Physical examination:  Nursing notes reviewed; Vital signs and O2 SAT reviewed;  Constitutional: Well developed, Well nourished, Well hydrated, In no acute distress; Head:  Normocephalic, atraumatic; Eyes: EOMI, PERRL, No scleral icterus; ENMT: Mouth and pharynx normal, Mucous membranes moist; Neck: Supple, Full range of motion, No lymphadenopathy; Cardiovascular: Regular rate and rhythm, No gallop; Respiratory: Breath sounds clear & equal bilaterally, No wheezes.  Speaking full sentences with ease, Normal respiratory effort/excursion; Chest: Nontender, Movement normal; Abdomen: Soft, Nontender, Nondistended, Normal bowel sounds;; Extremities: Pulses normal, No tenderness, No edema, No calf edema or asymmetry.; Neuro: Awake, alert, confused re: time, events. No facial droop. Speech clear. No gross focal motor deficits in extremities.; Skin: Color normal, Warm, Dry.; Psych:  Agitated, yelling at ED staff.   ED Course  Procedures (including critical care time) Labs Review   Imaging Review  I have personally reviewed and evaluated these images and lab results as part of my medical decision-making.   EKG Interpretation   Date/Time:  Friday November 30 2015 14:18:13 EDT Ventricular Rate:  79 PR Interval:  152 QRS Duration: 106 QT Interval:  390 QTC Calculation: 447 R Axis:   71 Text Interpretation:  Normal sinus rhythm Right atrial enlargement  Incomplete right bundle branch block When compared with ECG of 10/10/2015  No significant change was found Confirmed by [facility]  MD, Nicholos Johns [phone])  on 11/30/2015 3:22:45 PM      MDM  MDM Reviewed: previous chart, nursing note and  vitals Reviewed previous: labs and ECG Interpretation: labs, ECG and CT scan     Results for orders placed or performed during the hospital encounter of 11/30/15  Comprehensive metabolic panel  Result Value Ref Range   Sodium 137 135 - 145 mmol/L   Potassium 3.3 (L) 3.5 - 5.1 mmol/L   Chloride 100 (L) 101 - 111 mmol/L   CO2 26 22 - 32 mmol/L   Glucose, Bld 117 (H) 65 - 99 mg/dL   BUN 15 6 - 20 mg/dL   Creatinine, Ser 1.91 0.44 - 1.00 mg/dL   Calcium 8.9 8.9 - 47.8 mg/dL   Total Protein 8.1 6.5 - 8.1 g/dL   Albumin 3.7 3.5 - 5.0 g/dL   AST 35 15 - 41 U/L   ALT 31 14 - 54 U/L   Alkaline Phosphatase 58 38 -  126 U/L   Total Bilirubin 1.0 0.3 - 1.2 mg/dL   GFR calc non Af Amer 58 (L) >60 mL/min   GFR calc Af Amer >60 >60 mL/min   Anion gap 11 5 - 15  Ethanol (ETOH)  Result Value Ref Range   Alcohol, Ethyl (B) <5 <5 mg/dL  Salicylate level  Result Value Ref Range   Salicylate Lvl <4.0 2.8 - 30.0 mg/dL  Acetaminophen level  Result Value Ref Range   Acetaminophen (Tylenol), Serum <10 (L) 10 - 30 ug/mL  CBC  Result Value Ref Range   WBC 9.8 4.0 - 10.5 K/uL   RBC 4.04 3.87 - 5.11 MIL/uL   Hemoglobin 12.9 12.0 - 15.0 g/dL   HCT 56.239.1 13.036.0 - 86.546.0 %   MCV 96.8 78.0 - 100.0 fL   MCH 31.9 26.0 - 34.0 pg   MCHC 33.0 30.0 - 36.0 g/dL   RDW 78.414.1 69.611.5 - 29.515.5 %   Platelets 231 150 - 400 K/uL  Urine rapid drug screen (hosp performed) ([facility])  Result Value Ref Range   Opiates POSITIVE (A) NONE DETECTED   Cocaine NONE DETECTED NONE DETECTED   Benzodiazepines NONE DETECTED NONE DETECTED   Amphetamines NONE DETECTED NONE DETECTED   Tetrahydrocannabinol NONE DETECTED NONE DETECTED   Barbiturates NONE DETECTED NONE DETECTED  Troponin I  Result Value Ref Range   Troponin I <0.03 <0.031 ng/mL  Urinalysis, Routine w reflex microscopic  Result Value Ref Range   Color, Urine YELLOW YELLOW   APPearance CLEAR CLEAR   Specific Gravity, Urine 1.015 1.005 - 1.030   pH 8.0 5.0 - 8.0    Glucose, UA NEGATIVE NEGATIVE mg/dL   Hgb urine dipstick TRACE (A) NEGATIVE   Bilirubin Urine NEGATIVE NEGATIVE   Ketones, ur NEGATIVE NEGATIVE mg/dL   Protein, ur TRACE (A) NEGATIVE mg/dL   Nitrite NEGATIVE NEGATIVE   Leukocytes, UA NEGATIVE NEGATIVE  Urine microscopic-add on  Result Value Ref Range   Squamous Epithelial / LPF 0-5 (A) NONE SEEN   WBC, UA 0-5 0 - 5 WBC/hpf   RBC / HPF 0-5 0 - 5 RBC/hpf   Bacteria, UA RARE (A) NONE SEEN   Ct Head Wo Contrast 11/30/2015  CLINICAL DATA:  Erratic behavior with altered mental status. Alzheimer's dementia EXAM: CT HEAD WITHOUT CONTRAST TECHNIQUE: Contiguous axial images were obtained from the base of the skull through the vertex without intravenous contrast. COMPARISON:  None. FINDINGS: There is mild diffuse atrophy. There is no intracranial mass, hemorrhage, extra-axial fluid collection, or midline shift. There is mild small vessel disease in the centra semiovale bilaterally. Elsewhere, gray-white compartments appear normal. No acute infarct evident. Bony calvarium appears intact. The mastoid air cells are clear. No intraorbital lesions are identified. IMPRESSION: Mild atrophy with mild periventricular small vessel disease. No intracranial mass, hemorrhage, or evidence of acute infarct. Electronically Signed   By: Bretta BangWilliam  Woodruff III M.D.   On: 11/30/2015 15:01    1555:  Pt currently being tx for pneumonia (seen in ED 2 days ago and rx antibiotics). WBC count normal today, pt is afebrile with stable VS and is not hypoxic. No clear indication for medical admission at this time. Potassium repleted PO. TTS has evaluated pt: states they will talk with husband and call back. Dispo is pending. Holding orders written.     Samuel JesterKathleen Caylon Saine, DO 11/30/15 1558

## 2015-12-01 LAB — CULTURE, BLOOD (ROUTINE X 2)
Culture: NO GROWTH
Culture: NO GROWTH

## 2015-12-02 LAB — URINE CULTURE: CULTURE: NO GROWTH

## 2016-01-02 ENCOUNTER — Ambulatory Visit: Payer: Self-pay | Admitting: Infectious Disease

## 2016-01-11 ENCOUNTER — Encounter (HOSPITAL_COMMUNITY): Payer: Self-pay

## 2016-01-11 ENCOUNTER — Emergency Department (HOSPITAL_COMMUNITY)
Admission: EM | Admit: 2016-01-11 | Discharge: 2016-01-12 | Disposition: A | Discharge status: Home / Self Care | Payer: Medicare HMO | Attending: Emergency Medicine | Admitting: Emergency Medicine

## 2016-01-11 ENCOUNTER — Emergency Department (HOSPITAL_COMMUNITY): Payer: Medicare HMO

## 2016-01-11 DIAGNOSIS — Z880 Allergy status to penicillin: Secondary | ICD-10-CM | POA: Diagnosis not present

## 2016-01-11 DIAGNOSIS — Z8701 Personal history of pneumonia (recurrent): Secondary | ICD-10-CM | POA: Insufficient documentation

## 2016-01-11 DIAGNOSIS — L03116 Cellulitis of left lower limb: Secondary | ICD-10-CM | POA: Insufficient documentation

## 2016-01-11 DIAGNOSIS — M7989 Other specified soft tissue disorders: Secondary | ICD-10-CM | POA: Diagnosis present

## 2016-01-11 DIAGNOSIS — M1990 Unspecified osteoarthritis, unspecified site: Secondary | ICD-10-CM | POA: Insufficient documentation

## 2016-01-11 DIAGNOSIS — J449 Chronic obstructive pulmonary disease, unspecified: Secondary | ICD-10-CM | POA: Diagnosis not present

## 2016-01-11 DIAGNOSIS — M109 Gout, unspecified: Secondary | ICD-10-CM | POA: Insufficient documentation

## 2016-01-11 DIAGNOSIS — I82402 Acute embolism and thrombosis of unspecified deep veins of left lower extremity: Secondary | ICD-10-CM | POA: Insufficient documentation

## 2016-01-11 DIAGNOSIS — F0280 Dementia in other diseases classified elsewhere without behavioral disturbance: Secondary | ICD-10-CM | POA: Diagnosis not present

## 2016-01-11 DIAGNOSIS — Z792 Long term (current) use of antibiotics: Secondary | ICD-10-CM | POA: Diagnosis not present

## 2016-01-11 DIAGNOSIS — Z8619 Personal history of other infectious and parasitic diseases: Secondary | ICD-10-CM | POA: Insufficient documentation

## 2016-01-11 DIAGNOSIS — Z7982 Long term (current) use of aspirin: Secondary | ICD-10-CM | POA: Insufficient documentation

## 2016-01-11 DIAGNOSIS — Z79899 Other long term (current) drug therapy: Secondary | ICD-10-CM | POA: Insufficient documentation

## 2016-01-11 DIAGNOSIS — I10 Essential (primary) hypertension: Secondary | ICD-10-CM | POA: Insufficient documentation

## 2016-01-11 DIAGNOSIS — F419 Anxiety disorder, unspecified: Secondary | ICD-10-CM | POA: Diagnosis not present

## 2016-01-11 DIAGNOSIS — G309 Alzheimer's disease, unspecified: Secondary | ICD-10-CM | POA: Diagnosis not present

## 2016-01-11 LAB — CBC
HEMATOCRIT: 34 % — AB (ref 36.0–46.0)
HEMOGLOBIN: 10.9 g/dL — AB (ref 12.0–15.0)
MCH: 31.2 pg (ref 26.0–34.0)
MCHC: 32.1 g/dL (ref 30.0–36.0)
MCV: 97.4 fL (ref 78.0–100.0)
Platelets: 196 10*3/uL (ref 150–400)
RBC: 3.49 MIL/uL — AB (ref 3.87–5.11)
RDW: 13.7 % (ref 11.5–15.5)
WBC: 5.8 10*3/uL (ref 4.0–10.5)

## 2016-01-11 LAB — URINALYSIS, ROUTINE W REFLEX MICROSCOPIC
Bilirubin Urine: NEGATIVE
GLUCOSE, UA: NEGATIVE mg/dL
Hgb urine dipstick: NEGATIVE
Ketones, ur: NEGATIVE mg/dL
Nitrite: NEGATIVE
PH: 6 (ref 5.0–8.0)
Protein, ur: NEGATIVE mg/dL
SPECIFIC GRAVITY, URINE: 1.019 (ref 1.005–1.030)

## 2016-01-11 LAB — COMPREHENSIVE METABOLIC PANEL
ALBUMIN: 3.1 g/dL — AB (ref 3.5–5.0)
ALT: 23 U/L (ref 14–54)
ANION GAP: 10 (ref 5–15)
AST: 28 U/L (ref 15–41)
Alkaline Phosphatase: 50 U/L (ref 38–126)
BILIRUBIN TOTAL: 0.6 mg/dL (ref 0.3–1.2)
BUN: 16 mg/dL (ref 6–20)
CO2: 29 mmol/L (ref 22–32)
Calcium: 8.9 mg/dL (ref 8.9–10.3)
Chloride: 104 mmol/L (ref 101–111)
Creatinine, Ser: 1.12 mg/dL — ABNORMAL HIGH (ref 0.44–1.00)
GFR calc non Af Amer: 44 mL/min — ABNORMAL LOW (ref >60)
GFR, EST AFRICAN AMERICAN: 50 mL/min — AB (ref >60)
GLUCOSE: 137 mg/dL — AB (ref 65–99)
POTASSIUM: 3.5 mmol/L (ref 3.5–5.1)
Sodium: 143 mmol/L (ref 135–145)
TOTAL PROTEIN: 6.7 g/dL (ref 6.5–8.1)

## 2016-01-11 LAB — TROPONIN I

## 2016-01-11 LAB — URINE MICROSCOPIC-ADD ON: RBC / HPF: NONE SEEN RBC/hpf (ref 0–5)

## 2016-01-11 LAB — PROTIME-INR
INR: 1.11 (ref 0.00–1.49)
PROTHROMBIN TIME: 14.5 s (ref 11.6–15.2)

## 2016-01-11 LAB — LACTIC ACID, PLASMA: Lactic Acid, Venous: 1.8 mmol/L (ref 0.5–2.0)

## 2016-01-11 MED ORDER — APIXABAN 2.5 MG PO TABS: 2.5000 mg | ORAL_TABLET | Freq: Two times a day (BID) | ORAL | Status: DC

## 2016-01-11 MED ORDER — DOXYCYCLINE HYCLATE 100 MG PO CAPS: 100.0000 mg | ORAL_CAPSULE | Freq: Two times a day (BID) | ORAL | Status: DC

## 2016-01-11 MED ORDER — VANCOMYCIN HCL IN DEXTROSE 1-5 GM/200ML-% IV SOLN
1000.0000 mg | Freq: Once | INTRAVENOUS | Status: AC
  Administered 2016-01-11: 1000 mg via INTRAVENOUS
  Filled 2016-01-11: qty 200

## 2016-01-11 MED ORDER — APIXABAN 5 MG PO TABS
10.0000 mg | ORAL_TABLET | Freq: Once | ORAL | Status: AC
  Administered 2016-01-12: 10 mg via ORAL
  Filled 2016-01-11: qty 2

## 2016-01-11 MED ORDER — IOPAMIDOL (ISOVUE-370) INJECTION 76%
INTRAVENOUS | Status: AC
  Administered 2016-01-11: 100 mL
  Filled 2016-01-11: qty 100

## 2016-01-11 MED ORDER — APIXABAN 5 MG PO TABS
10.0000 mg | ORAL_TABLET | Freq: Two times a day (BID) | ORAL | Status: DC
Start: 2016-01-11 — End: 2016-11-18

## 2016-01-11 NOTE — ED Provider Notes (Signed)
CSN: [id number]     Arrival date & time 01/11/16  1759 History   First MD Initiated Contact with Patient 01/11/16 1814     Chief Complaint  Patient presents with  . DVT   PT WAS BROUGHT HERE VIA EMS DUE TO A + [facility].  THE STUDY WAS DONE AT [facility] AND SHOWED A NONOCCLUSIVE DVT LEFT COMMON FEMORAL VEIN.  PT IS ALSO NOTED TO HAVE LEFT LEG REDNESS AND SWELLING TO LEFT LEG.  PT DID HAVE A LLE DVT IN 2008.  SHE IS NOT CURRENTLY ON ANY ANTICOAGULANTS.  PT HAS DEMENTIA AND UNABLE TO GIVE ANY SIGNIFICANT HX, BUT SAYS THAT HER LEG DOES NOT HURT.  (Consider location/radiation/quality/duration/timing/severity/associated sxs/prior Treatment) The history is provided by the patient, the EMS personnel and a friend. The history is limited by the condition of the patient.    Past Medical History  Diagnosis Date  . Hypertension   . COPD (chronic obstructive pulmonary disease) (HCC)   . Anxiety   . Shortness of breath   . Pneumonia 04/2013  . Headache(784.0)   . Arthritis   . MAI (mycobacterium avium-intracellulare) infection (HCC) 2014  . Bronchiectasis (HCC) 2014  . IBS (irritable bowel syndrome)   . Gout   . DVT (deep venous thrombosis) (HCC) 2008    LLE  . Mold exposure 06/20/2015  . Alzheimer's dementia    Past Surgical History  Procedure Laterality Date  . Abdominal hysterectomy    . Cholecystectomy    . Fracture surgery    . Cataract extraction     Family History  Problem Relation Age of Onset  . Diabetes Father   . Hypertension Father   . Lung cancer Sister     smoked  . Rheum arthritis Father    Social History  Substance Use Topics  . Smoking status: Never Smoker   . Smokeless tobacco: Never Used  . Alcohol Use: No   OB History    No data available     Review of Systems  Skin: Positive for rash.  All other systems reviewed and are negative.     Allergies  Amoxicillin; Darvocet; Penicillins; Sulfa  antibiotics; and Propoxyphene  Home Medications   Prior to Admission medications   Medication Sig Start Date End Date Taking? Authorizing Provider  albuterol (PROVENTIL) (2.5 MG/3ML) 0.083% nebulizer solution Take 2.5 mg by nebulization 2 (two) times daily.   Yes Historical Provider, MD  allopurinol (ZYLOPRIM) 300 MG tablet Take 300 mg by mouth daily.   Yes Historical Provider, MD  arformoterol (BROVANA) 15 MCG/2ML NEBU Take 15 mcg by nebulization daily.   Yes Historical Provider, MD  aspirin EC 81 MG tablet Take 81 mg by mouth daily.   Yes Historical Provider, MD  azithromycin (ZITHROMAX) 500 MG tablet Take 1 tablet (500 mg total) by mouth 3 (three) times a week. Patient taking differently: Take 500 mg by mouth every Monday, Wednesday, and Friday.  11/06/15  Yes Randall Hissornelius N Van Dam, MD  bisoprolol (ZEBETA) 5 MG tablet Take 1 tablet (5 mg total) by mouth daily. 11/01/15  Yes Nyoka CowdenMichael B Wert, MD  dorzolamide (TRUSOPT) 2 % ophthalmic solution Place 1 drop into both eyes 2 (two) times daily.   Yes Historical Provider, MD  ethambutol (MYAMBUTOL) 400 MG tablet Take 4 tablets (1,600 mg total) by mouth 3 (three) times a week. Patient taking differently: Take 1,600 mg by mouth every Monday, Wednesday, and Friday.  10/03/15  Yes Randall Hiss, MD  guaiFENesin-codeine 100-10 MG/5ML syrup Take 10 mLs by mouth every 6 (six) hours as needed for cough. 01/19/15  Yes Kari Baars, MD  latanoprost (XALATAN) 0.005 % ophthalmic solution Place 1 drop into both eyes at bedtime.   Yes Historical Provider, MD  magnesium oxide (MAG-OX) 400 MG tablet Take 400 mg by mouth daily.   Yes Historical Provider, MD  METHSCOPOLAMINE BROMIDE PO Take 1 tablet by mouth 3 (three) times daily as needed (for secretions).   Yes Historical Provider, MD  metoprolol succinate (TOPROL-XL) 25 MG 24 hr tablet Take 25 mg by mouth daily.   Yes Historical Provider, MD  mirtazapine (REMERON) 7.5 MG tablet Take 7.5 mg by mouth at bedtime.    Yes Historical Provider, MD  Multiple Vitamin (DAILY VITE) TABS Take 1 tablet by mouth daily.   Yes Historical Provider, MD  rifampin (RIFADIN) 300 MG capsule Take 2 capsules (600 mg total) by mouth 3 (three) times a week. Patient taking differently: Take 600 mg by mouth every Monday, Wednesday, and Friday.  10/03/15  Yes Randall Hiss, MD  triamterene-hydrochlorothiazide (MAXZIDE-25) 37.5-25 MG per tablet Take 1 tablet by mouth every other day.   Yes Historical Provider, MD  apixaban (ELIQUIS) 2.5 MG TABS tablet Take 1 tablet (2.5 mg total) by mouth 2 (two) times daily. 01/11/16   Jacalyn Lefevre, MD  brimonidine-timolol (COMBIGAN) 0.2-0.5 % ophthalmic solution Place 1 drop into both eyes every 12 (twelve) hours.    Historical Provider, MD  doxycycline (VIBRAMYCIN) 100 MG capsule Take 1 capsule (100 mg total) by mouth 2 (two) times daily. 01/11/16   Jacalyn Lefevre, MD  levofloxacin (LEVAQUIN) 500 MG tablet Take 1 tablet (500 mg total) by mouth daily. 11/26/15   Devoria Albe, MD  Respiratory Therapy Supplies (FLUTTER) DEVI Use as directed 11/01/15   Nyoka Cowden, MD   BP 158/60 mmHg  Pulse 73  Temp(Src) 97.9 F (36.6 C) (Oral)  Resp 18  SpO2 95% Physical Exam  Constitutional: She appears well-developed and well-nourished.  HENT:  Head: Normocephalic and atraumatic.  Right Ear: External ear normal.  Left Ear: External ear normal.  Mouth/Throat: Oropharynx is clear and moist.  Eyes: Conjunctivae are normal. Pupils are equal, round, and reactive to light.  Neck: Normal range of motion. Neck supple.  Cardiovascular: Normal rate, regular rhythm, normal heart sounds and intact distal pulses.   Pulmonary/Chest: Effort normal and breath sounds normal.  Abdominal: Soft. Bowel sounds are normal.  Musculoskeletal: Normal range of motion.  Neurological: She is alert.  Skin: There is erythema.  Psychiatric: She has a normal mood and affect.  Nursing note and vitals reviewed.   ED Course   Procedures (including critical care time) Labs Review Labs Reviewed  CBC - Abnormal; Notable for the following:    RBC 3.49 (*)    Hemoglobin 10.9 (*)    HCT 34.0 (*)    All other components within normal limits  COMPREHENSIVE METABOLIC PANEL - Abnormal; Notable for the following:    Glucose, Bld 137 (*)    Creatinine, Ser 1.12 (*)    Albumin 3.1 (*)    GFR calc non Af Amer 44 (*)    GFR calc Af Amer 50 (*)    All other components within normal limits  URINALYSIS, ROUTINE W REFLEX MICROSCOPIC (NOT AT [facility]) - Abnormal; Notable for the following:    Color, Urine AMBER (*)    Leukocytes, UA SMALL (*)  All other components within normal limits  URINE MICROSCOPIC-ADD ON - Abnormal; Notable for the following:    Squamous Epithelial / LPF 0-5 (*)    Bacteria, UA RARE (*)    Casts HYALINE CASTS (*)    All other components within normal limits  CULTURE, BLOOD (ROUTINE X 2)  CULTURE, BLOOD (ROUTINE X 2)  PROTIME-INR  TROPONIN I  LACTIC ACID, PLASMA  LACTIC ACID, PLASMA    Imaging Review Ct Angio Chest Pe W/cm &/or Wo Cm  01/11/2016  CLINICAL DATA:  Acute onset of shortness of breath and wheezing. Recently diagnosed with deep venous thrombosis. Initial encounter. EXAM: CT ANGIOGRAPHY CHEST WITH CONTRAST TECHNIQUE: Multidetector CT imaging of the chest was performed using the standard protocol during bolus administration of intravenous contrast. Multiplanar CT image reconstructions and MIPs were obtained to evaluate the vascular anatomy. CONTRAST:  100 mL of Isovue 370 IV contrast COMPARISON:  Chest radiograph performed 11/26/2015, and CTA of the chest performed 04/25/2013 FINDINGS: There is no evidence of pulmonary embolus. Scattered fibrotic change is noted at the left lingula, right middle lobe and right lung base, and along the right major fissure. Evaluation of the lung fields is suboptimal due to motion artifact. Underlying emphysematous change is suggested. No pleural effusion or  pneumothorax is seen. No masses are identified; no abnormal focal contrast enhancement is seen. A 1.6 cm subcarinal node is noted. Remaining visualized mediastinal nodes are normal in size. No pericardial effusion is identified. The great vessels are grossly unremarkable in appearance. No axillary lymphadenopathy is seen. The thyroid gland is unremarkable in appearance. The visualized portions of the liver and spleen are unremarkable. The visualized portions of the pancreas, adrenal glands and kidneys are grossly unremarkable. No acute osseous abnormalities are seen. Anterior osteophytes are noted along the lower cervical spine. Review of the MIP images confirms the above findings. IMPRESSION: 1. No evidence of pulmonary embolus. 2. Scattered bilateral fibrotic change noted, with underlying emphysematous change. 3. 1.6 cm subcarinal node is nonspecific. Electronically Signed   By: Roanna Raider M.D.   On: 01/11/2016 22:53   I have personally reviewed and evaluated these images and lab results as part of my medical decision-making.   EKG Interpretation   Date/Time:  Friday January 11 2016 19:15:01 EDT Ventricular Rate:  72 PR Interval:  161 QRS Duration: 119 QT Interval:  392 QTC Calculation: 429 R Axis:   74 Text Interpretation:  Sinus rhythm Probable left atrial enlargement  Incomplete right bundle branch block Confirmed by Satya Bohall MD, Navayah Sok  ([id number]) on 01/11/2016 7:26:12 PM      MDM  PT WILL BE STARTED ON ELIQUIS FOR HER DVT.  NO EVIDENCE OF PE.  OK FOR D/C.  Final diagnoses:  DVT (deep venous thrombosis), left  Cellulitis of left lower extremity        Jacalyn Lefevre, MD 01/11/16 2308

## 2016-01-11 NOTE — ED Notes (Signed)
Pt. Refusing IV until her husband arrives.

## 2016-01-11 NOTE — Discharge Instructions (Signed)
Deep Vein Thrombosis °A deep vein thrombosis (DVT) is a blood clot (thrombus) that usually occurs in a deep, larger vein of the lower leg or the pelvis, or in an upper extremity such as the arm. These are dangerous and can lead to serious and even life-threatening complications if the clot travels to the lungs. °A DVT can damage the valves in your leg veins so that instead of flowing upward, the blood pools in the lower leg. This is called post-thrombotic syndrome, and it can result in pain, swelling, discoloration, and sores on the leg. °CAUSES °A DVT is caused by the formation of a blood clot in your leg, pelvis, or arm. Usually, several things contribute to the formation of blood clots. A clot may develop when: °· Your blood flow slows down. °· Your vein becomes damaged in some way. °· You have a condition that makes your blood clot more easily. °RISK FACTORS °A DVT is more likely to develop in: °· People who are older, especially over 60 years of age. °· People who are overweight (obese). °· People who sit or lie still for a long time, such as during long-distance travel (over 4 hours), bed rest, hospitalization, or during recovery from certain medical conditions like a stroke. °· People who do not engage in much physical activity (sedentary lifestyle). °· People who have chronic breathing disorders. °· People who have a personal or family history of blood clots or blood clotting disease. °· People who have peripheral vascular disease (PVD), diabetes, or some types of cancer. °· People who have heart disease, especially if the person had a recent heart attack or has congestive heart failure. °· People who have neurological diseases that affect the legs (leg paresis). °· People who have had a traumatic injury, such as breaking a hip or leg. °· People who have recently had major or lengthy surgery, especially on the hip, knee, or abdomen. °· People who have had a central line placed inside a large vein. °· People  who take medicines that contain the hormone estrogen. These include birth control pills and hormone replacement therapy. °· Pregnancy or during childbirth or the postpartum period. °· Long plane flights (over 8 hours). °SIGNS AND SYMPTOMS °Symptoms of a DVT can include:  °· Swelling of your leg or arm, especially if one side is much worse. °· Warmth and redness of your leg or arm, especially if one side is much worse. °· Pain in your arm or leg. If the clot is in your leg, symptoms may be more noticeable or worse when you stand or walk. °· A feeling of pins and needles, if the clot is in the arm. °The symptoms of a DVT that has traveled to the lungs (pulmonary embolism, PE) usually start suddenly and include: °· Shortness of breath while active or at rest. °· Coughing or coughing up blood or blood-tinged mucus. °· Chest pain that is often worse with deep breaths. °· Rapid or irregular heartbeat. °· Feeling light-headed or dizzy. °· Fainting. °· Feeling anxious. °· Sweating. °There may also be pain and swelling in a leg if that is where the blood clot started. °These symptoms may represent a serious problem that is an emergency. Do not wait to see if the symptoms will go away. Get medical help right away. Call your local emergency services (911 in the [place] Do not drive yourself to the hospital. °DIAGNOSIS °Your health care provider will take a medical history and perform a physical exam. You may also   have other tests, including: °· Blood tests to assess the clotting properties of your blood. °· Imaging tests, such as CT, ultrasound, MRI, X-ray, and other tests to see if you have clots anywhere in your body. °TREATMENT °After a DVT is identified, it can be treated. The type of treatment that you receive depends on many factors, such as the cause of your DVT, your risk for bleeding or developing more clots, and other medical conditions that you have. Sometimes, a combination of treatments is necessary. Treatment  options may be combined and include: °· Monitoring the blood clot with ultrasound. °· Taking medicines by mouth, such as newer blood thinners (anticoagulants), thrombolytics, or warfarin. °· Taking anticoagulant medicine by injection or through an IV tube. °· Wearing compression stockings or using different types of devices. °· Surgery (rare) to remove the blood clot or to place a filter in your abdomen to stop the blood clot from traveling to your lungs. °Treatments for a DVT are often divided into immediate treatment and long-term treatment (up to 3 months after DVT). You can work with your health care provider to choose the treatment program that is best for you. °HOME CARE INSTRUCTIONS °If you are taking a newer oral anticoagulant: °· Take the medicine every single day at the same time each day. °· Understand what foods and drugs interact with this medicine. °· Understand that there are no regular blood tests required when using this medicine. °· Understand the side effects of this medicine, including excessive bruising or bleeding. Ask your health care provider or pharmacist about other possible side effects. °If you are taking warfarin: °· Understand how to take warfarin and know which foods can affect how warfarin works in your body. °· Understand that it is dangerous to take too much or too little warfarin. Too much warfarin increases the risk of bleeding. Too little warfarin continues to allow the risk for blood clots. °· Follow your PT and INR blood testing schedule. The PT and INR results allow your health care provider to adjust your dose of warfarin. It is very important that you have your PT and INR tested as often as told by your health care provider. °· Avoid major changes in your diet, or tell your health care provider before you change your diet. Arrange a visit with a registered dietitian to answer your questions. Many foods, especially foods that are high in vitamin K, can interfere with warfarin  and affect the PT and INR results. Eat a consistent amount of foods that are high in vitamin K, such as: °¨ Spinach, kale, broccoli, cabbage, collard greens, turnip greens, Brussels sprouts, peas, cauliflower, seaweed, and parsley. °¨ Beef liver and pork liver. °¨ Green tea. °¨ Soybean oil. °· Tell your health care provider about any and all medicines, vitamins, and supplements that you take, including aspirin and other over-the-counter anti-inflammatory medicines. Be especially cautious with aspirin and anti-inflammatory medicines. Do not take those before you ask your health care provider if it is safe to do so. This is important because many medicines can interfere with warfarin and affect the PT and INR results. °· Do not start or stop taking any over-the-counter or prescription medicine unless your health care provider or pharmacist tells you to do so. °If you take warfarin, you will also need to do these things: °· Hold pressure over cuts for longer than usual. °· Tell your dentist and other health care providers that you are taking warfarin before you have any procedures in which   bleeding may occur. °· Avoid alcohol or drink very small amounts. Tell your health care provider if you change your alcohol intake. °· Do not use tobacco products, including cigarettes, chewing tobacco, and e-cigarettes. If you need help quitting, ask your health care provider. °· Avoid contact sports. °General Instructions °· Take over-the-counter and prescription medicines only as told by your health care provider. Anticoagulant medicines can have side effects, including easy bruising and difficulty stopping bleeding. If you are prescribed an anticoagulant, you will also need to do these things: °¨ Hold pressure over cuts for longer than usual. °¨ Tell your dentist and other health care providers that you are taking anticoagulants before you have any procedures in which bleeding may occur. °¨ Avoid contact sports. °· Wear a medical  alert bracelet or carry a medical alert card that says you have had a PE. °· Ask your health care provider how soon you can go back to your normal activities. Stay active to prevent new blood clots from forming. °· Make sure to exercise while traveling or when you have been sitting or standing for a long period of time. It is very important to exercise. Exercise your legs by walking or by tightening and relaxing your leg muscles often. Take frequent walks. °· Wear compression stockings as told by your health care provider to help prevent more blood clots from forming. °· Do not use tobacco products, including cigarettes, chewing tobacco, and e-cigarettes. If you need help quitting, ask your health care provider. °· Keep all follow-up appointments with your health care provider. This is important. °PREVENTION °Take these actions to decrease your risk of developing another DVT: °· Exercise regularly. For at least 30 minutes every day, engage in: °¨ Activity that involves moving your arms and legs. °¨ Activity that encourages good blood flow through your body by increasing your heart rate. °· Exercise your arms and legs every hour during long-distance travel (over 4 hours). Drink plenty of water and avoid drinking alcohol while traveling. °· Avoid sitting or lying in bed for long periods of time without moving your legs. °· Maintain a weight that is appropriate for your height. Ask your health care provider what weight is healthy for you. °· If you are a woman who is over 35 years of age, avoid unnecessary use of medicines that contain estrogen. These include birth control pills. °· Do not smoke, especially if you take estrogen medicines. If you need help quitting, ask your health care provider. °If you are hospitalized, prevention measures may include: °· Early walking after surgery, as soon as your health care provider says that it is safe. °· Receiving anticoagulants to prevent blood clots. If you cannot take  anticoagulants, other options may be available, such as wearing compression stockings or using different types of devices. °SEEK IMMEDIATE MEDICAL CARE IF: °· You have new or increased pain, swelling, or redness in an arm or leg. °· You have numbness or tingling in an arm or leg. °· You have shortness of breath while active or at rest. °· You have chest pain. °· You have a rapid or irregular heartbeat. °· You feel light-headed or dizzy. °· You cough up blood. °· You notice blood in your vomit, bowel movement, or urine. °These symptoms may represent a serious problem that is an emergency. Do not wait to see if the symptoms will go away. Get medical help right away. Call your local emergency services (911 in the [place] Do not drive yourself to the hospital. °  °  This information is not intended to replace advice given to you by your health care provider. Make sure you discuss any questions you have with your health care provider.   Document Released: 09/01/2005 Document Revised: 05/23/2015 Document Reviewed: 12/27/2014 Elsevier Interactive Patient Education 2016 [employer]  Cellulitis Cellulitis is an infection of the skin and the tissue under the skin. The infected area is usually red and tender. This happens most often in the arms and lower legs. HOME CARE   Take your antibiotic medicine as told. Finish the medicine even if you start to feel better.  Keep the infected arm or leg raised (elevated).  Put a warm cloth on the area up to 4 times per day.  Only take medicines as told by your doctor.  Keep all doctor visits as told. GET HELP IF:  You see red streaks on the skin coming from the infected area.  Your red area gets bigger or turns a dark color.  Your bone or joint under the infected area is painful after the skin heals.  Your infection comes back in the same area or different area.  You have a puffy (swollen) bump in the infected area.  You have new symptoms.  You have a  fever. GET HELP RIGHT AWAY IF:   You feel very sleepy.  You throw up (vomit) or have watery poop (diarrhea).  You feel sick and have muscle aches and pains.   This information is not intended to replace advice given to you by your health care provider. Make sure you discuss any questions you have with your health care provider.   Document Released: 02/18/2008 Document Revised: 05/23/2015 Document Reviewed: 11/17/2011 Elsevier Interactive Patient Education [employer]

## 2016-01-11 NOTE — ED Notes (Addendum)
Pt. Coming from [facility] via [facility]. Pt. Here for DVT that was diagnosed today through outside imaging company. Copies of those results given to EDP at this time. Pt. Noted to have redness, 2+ edema, and warmth to left lower extremity. Pt. Hx of alzheimers. Pastor at bedside and husband on his way. Pt. Is not on blood thinners per EMS.

## 2016-01-12 NOTE — ED Notes (Addendum)
Attempted to call pt facility multiple times in the last hr. No answer. [facility] contacted.

## 2016-01-16 LAB — CULTURE, BLOOD (ROUTINE X 2)
CULTURE: NO GROWTH
CULTURE: NO GROWTH

## 2016-05-12 ENCOUNTER — Ambulatory Visit (INDEPENDENT_AMBULATORY_CARE_PROVIDER_SITE_OTHER): Payer: Medicare HMO | Admitting: Infectious Disease

## 2016-05-12 ENCOUNTER — Encounter: Payer: Self-pay | Admitting: Infectious Disease

## 2016-05-12 VITALS — BP 154/74 | HR 63 | Temp 97.6°F

## 2016-05-12 DIAGNOSIS — J441 Chronic obstructive pulmonary disease with (acute) exacerbation: Secondary | ICD-10-CM | POA: Diagnosis not present

## 2016-05-12 DIAGNOSIS — F0280 Dementia in other diseases classified elsewhere without behavioral disturbance: Secondary | ICD-10-CM

## 2016-05-12 DIAGNOSIS — A310 Pulmonary mycobacterial infection: Secondary | ICD-10-CM

## 2016-05-12 DIAGNOSIS — G309 Alzheimer's disease, unspecified: Secondary | ICD-10-CM | POA: Diagnosis not present

## 2016-05-12 LAB — HEPATIC FUNCTION PANEL
ALBUMIN: 3.8 g/dL (ref 3.6–5.1)
ALT: 10 U/L (ref 6–29)
AST: 17 U/L (ref 10–35)
Alkaline Phosphatase: 47 U/L (ref 33–130)
BILIRUBIN TOTAL: 0.5 mg/dL (ref 0.2–1.2)
Bilirubin, Direct: 0.1 mg/dL (ref <=0.2)
Indirect Bilirubin: 0.4 mg/dL (ref 0.2–1.2)
Total Protein: 6.9 g/dL (ref 6.1–8.1)

## 2016-05-12 NOTE — Progress Notes (Signed)
[facility]   Chief complaint: no new complaints, followup for M avium    Subjective:    Patient ID: Stacie Wright, female    DOB: 09-22-1927, 82 y.o.   MRN: [id number]  HPI  82 y.o. female with  COPD but not a smoker and claims to not have had asthma. We saw her in the hospital for HCAP workup and she had CT showing:  " multifocal infection, worse  within the lingula and right middle lobe, with associated  consolidative opacities and areas of bronchiectasis - as such, atypical etiologies (such as MAC) should be considered. And  Extensive basilar predominant tree in bud opacities are  worrisome for airways disseminated infection and/or aspiration"   WE obtained sputum for AFB, bacterial cx adn Fungal cx. Resp cultures were with OPF. Her B. 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 She finished a course of levaquin for her pna with bronchiectasis type duration (21days).    Along the way she injured her left lower extremity where she has hardware in place and had a draining area.. She was seen by Dr. Eulah Pont who apparently applied a type of "glue" to this area. There was still substantial erythema which has progressed despite her continued use of levofloxacin when I saw her 2 visits ago (> 2 years ago)  I changed her to doxycyline and got MRI of the leg which showed:  IMPRESSION:  1. Distal fibula lateral plate and screw fixation. Artifact obscures  the distal lateral leg and portions of the ankle.  2. Left leg swelling and subcutaneous edema which may represent  dependent edema or cellulitis in the appropriate clinical setting.  There is no abscess. No visible osteomyelitis is present.  3. Because of the fixation hardware, CT without infusion may be  useful to assess for changes of chronic osteomyelitis.   We considered treating her  Mycobacterium avium infection but  because AT THAT TIME  she denied weight loss or fevers she does have a nightly cough although is not as productive as it has been in the past, and she was without rx drug plan we opted not to start her. I then did not see her for next 2 years. In the interim she apparently has been treated for M avium with biaxin MONOTHERAPY, which I do not understand since this seems like a sure fire way to risk macrolide resistance.  When I then saw her subsequently   she felt poorly with poor appetite, malaise and cough throughout the day. We stopped her biaxin and obtained AFB sputum cultures which were NGTD at 4 weeks though she had recently been on biaxin. She did not grow M avium this time but MYCOBACTERIUM COLOREGONIUM . The November culture does not appear to have ever been finalized and released--when I last saw her.   At that visit we decided to start empiric M avium therapy with 3 drugs TIW and she has responded nicely with cough dramatically improved and  No side effects.   Her sputum from November never grew organism.. She is about to be admitted to Alzheimer's respite unit while husband is in the hospital with MI and likely to remain there.    Past Medical History:  Diagnosis Date  . Alzheimer's dementia   . Anxiety   . Arthritis   . Bronchiectasis (HCC) 2014  . COPD (chronic obstructive pulmonary disease) (HCC)   . DVT (  deep venous thrombosis) (HCC) 2008   LLE  . Gout   . Headache(784.0)   . Hypertension   . IBS (irritable bowel syndrome)   . MAI (mycobacterium avium-intracellulare) infection (HCC) 2014  . Mold exposure 06/20/2015  . Pneumonia 04/2013  . Shortness of breath     Past Surgical History:  Procedure Laterality Date  . ABDOMINAL HYSTERECTOMY    . CATARACT EXTRACTION    . CHOLECYSTECTOMY    . FRACTURE SURGERY      Family History  Problem Relation Age of Onset  . Diabetes Father   . Hypertension Father   . Lung cancer Sister     smoked  . Rheum arthritis Father        Social History   Social History  . Marital status: Married    Spouse name: N/A  . Number of children: N/A  . Years of education: N/A   Occupational History  . Retired    Social History Main Topics  . Smoking status: Never Smoker  . Smokeless tobacco: Never Used  . Alcohol use No  . Drug use: No  . Sexual activity: Not Currently   Other Topics Concern  . None   Social History Narrative  . None    Allergies  Allergen Reactions  . Amoxicillin Anaphylaxis and Rash  . Darvocet [Propoxyphene N-Acetaminophen] Other (See Comments)    hallucinations  . Penicillins Anaphylaxis and Rash    Has patient had a PCN reaction causing immediate rash, facial/tongue/throat swelling, SOB or lightheadedness with hypotension: Yes Has patient had a PCN reaction causing severe rash involving mucus membranes or skin necrosis: No Has patient had a PCN reaction that required hospitalization No Has patient had a PCN reaction occurring within the last 10 years: No If all of the above answers are "NO", then may proceed with Cephalosporin use.   . Sulfa Antibiotics Anaphylaxis and Rash  . Propoxyphene Other (See Comments)    hallucinations     Current Outpatient Prescriptions:  .  albuterol (PROVENTIL) (2.5 MG/3ML) 0.083% nebulizer solution, Take 2.5 mg by nebulization 2 (two) times daily., Disp: , Rfl:  .  allopurinol (ZYLOPRIM) 300 MG tablet, Take 300 mg by mouth daily., Disp: , Rfl:  .  apixaban (ELIQUIS) 5 MG TABS tablet, Take 2 tablets (10 mg total) by mouth 2 (two) times daily., Disp: 14 tablet, Rfl: 0 .  arformoterol (BROVANA) 15 MCG/2ML NEBU, Take 15 mcg by nebulization daily., Disp: , Rfl:  .  aspirin EC 81 MG tablet, Take 81 mg by mouth daily., Disp: , Rfl:  .  azithromycin (ZITHROMAX) 500 MG tablet, Take 1 tablet (500 mg total) by mouth 3 (three) times a week. (Patient taking differently: Take 500 mg by mouth every Monday, Wednesday, and Friday. ), Disp: 12 tablet, Rfl: 10 .   bisoprolol (ZEBETA) 5 MG tablet, Take 1 tablet (5 mg total) by mouth daily., Disp: 30 tablet, Rfl: 2 .  brimonidine-timolol (COMBIGAN) 0.2-0.5 % ophthalmic solution, Place 1 drop into both eyes every 12 (twelve) hours., Disp: , Rfl:  .  dorzolamide (TRUSOPT) 2 % ophthalmic solution, Place 1 drop into both eyes 2 (two) times daily., Disp: , Rfl:  .  ethambutol (MYAMBUTOL) 400 MG tablet, Take 4 tablets (1,600 mg total) by mouth 3 (three) times a week. (Patient taking differently: Take 1,600 mg by mouth every Monday, Wednesday, and Friday. ), Disp: 48 tablet, Rfl: 11 .  guaiFENesin-codeine 100-10 MG/5ML syrup, Take 10 mLs by mouth every 6 (six) hours  as needed for cough., Disp: 120 mL, Rfl: 0 .  latanoprost (XALATAN) 0.005 % ophthalmic solution, Place 1 drop into both eyes at bedtime., Disp: , Rfl:  .  levofloxacin (LEVAQUIN) 500 MG tablet, Take 1 tablet (500 mg total) by mouth daily., Disp: 7 tablet, Rfl: 0 .  magnesium oxide (MAG-OX) 400 MG tablet, Take 400 mg by mouth daily., Disp: , Rfl:  .  METHSCOPOLAMINE BROMIDE PO, Take 1 tablet by mouth 3 (three) times daily as needed (for secretions)., Disp: , Rfl:  .  metoprolol succinate (TOPROL-XL) 25 MG 24 hr tablet, Take 25 mg by mouth daily., Disp: , Rfl:  .  mirtazapine (REMERON) 7.5 MG tablet, Take 7.5 mg by mouth at bedtime., Disp: , Rfl:  .  Multiple Vitamin (DAILY VITE) TABS, Take 1 tablet by mouth daily., Disp: , Rfl:  .  Respiratory Therapy Supplies (FLUTTER) DEVI, Use as directed, Disp: 1 each, Rfl: 0 .  rifampin (RIFADIN) 300 MG capsule, Take 2 capsules (600 mg total) by mouth 3 (three) times a week. (Patient taking differently: Take 600 mg by mouth every Monday, Wednesday, and Friday. ), Disp: 30 capsule, Rfl: 11 .  triamterene-hydrochlorothiazide (MAXZIDE-25) 37.5-25 MG per tablet, Take 1 tablet by mouth every other day., Disp: , Rfl:      Review of Systems  Unable to perform ROS: Dementia  Constitutional: Positive for fatigue. Negative  for appetite change, chills, diaphoresis and unexpected weight change.  HENT: Negative for congestion, rhinorrhea, sinus pressure, sneezing and trouble swallowing.   Eyes: Negative for photophobia and visual disturbance.  Respiratory: Positive for cough. Negative for chest tightness, shortness of breath, wheezing and stridor.   Cardiovascular: Negative for chest pain, palpitations and leg swelling.  Gastrointestinal: Negative for abdominal distention, abdominal pain, anal bleeding, blood in stool, constipation, diarrhea and vomiting.  Genitourinary: Negative for difficulty urinating, dysuria, flank pain and hematuria.  Musculoskeletal: Negative for arthralgias, back pain, gait problem, joint swelling and myalgias.  Skin: Negative for color change, pallor and rash.  Neurological: Negative for dizziness, tremors, weakness and light-headedness.  Hematological: Negative for adenopathy. Does not bruise/bleed easily.  Psychiatric/Behavioral: Negative for agitation, behavioral problems, confusion, decreased concentration, dysphoric mood and sleep disturbance.       Objective:   Physical Exam  Constitutional: She is oriented to person, place, and time. No distress.  HENT:  Head: Normocephalic and atraumatic.  Mouth/Throat: Oropharynx is clear and moist. No oropharyngeal exudate.  Eyes: Conjunctivae and EOM are normal. Pupils are equal, round, and reactive to light. No scleral icterus.  Neck: Normal range of motion. Neck supple. No JVD present.  Cardiovascular: Normal rate, regular rhythm and normal heart sounds.  Exam reveals no gallop and no friction rub.   No murmur heard. Pulmonary/Chest: Effort normal. No respiratory distress. She has decreased breath sounds in the right lower field and the left lower field. She has rhonchi in the right lower field and the left lower field. She has rales. She exhibits no tenderness.  Abdominal: She exhibits no distension and no mass. There is no tenderness.  There is no rebound and no guarding.  Musculoskeletal: She exhibits no edema or tenderness.  Lymphadenopathy:    She has no cervical adenopathy.  Neurological: She is alert and oriented to person, place, and time. She has normal reflexes. She exhibits normal muscle tone. Coordination normal.  Skin: Skin is warm and dry. She is not diaphoretic. There is erythema. No pallor.  Psychiatric: She has a normal mood and affect. She  is slowed. She is not agitated. Cognition and memory are impaired.          Assessment & Plan:   #1 Mavium intracellulare with bronchiectasis : She did NOT grow M avium in NOvember but perhaps because she was on abx. She has had imporovement on therapeutic trial of Azitho, ETH, Rifampin MWF  We will continue this through January.  I DO feel they will continue to improve her QUALITY OF LIFE re of longterm prognosis   #2 Dementia: I would encourage goals of care discussion given her age, dementia and multiple comorbidities  I spent greater than 25 minutes with the patient including greater than 50% of time in face to face counsel of the patient and her husband re her M avium infection, bronchiectasis and in coordination of her care as she is headed to Alzheimers unit

## 2016-05-28 IMAGING — DX DG CHEST 2V
3 series · 3 of 3 positions shown · non-contrast
Comparison: 08/13/2015 and 01/15/2015

CLINICAL DATA: Productive cough

EXAM:
CHEST  2 VIEW

[chest pa]
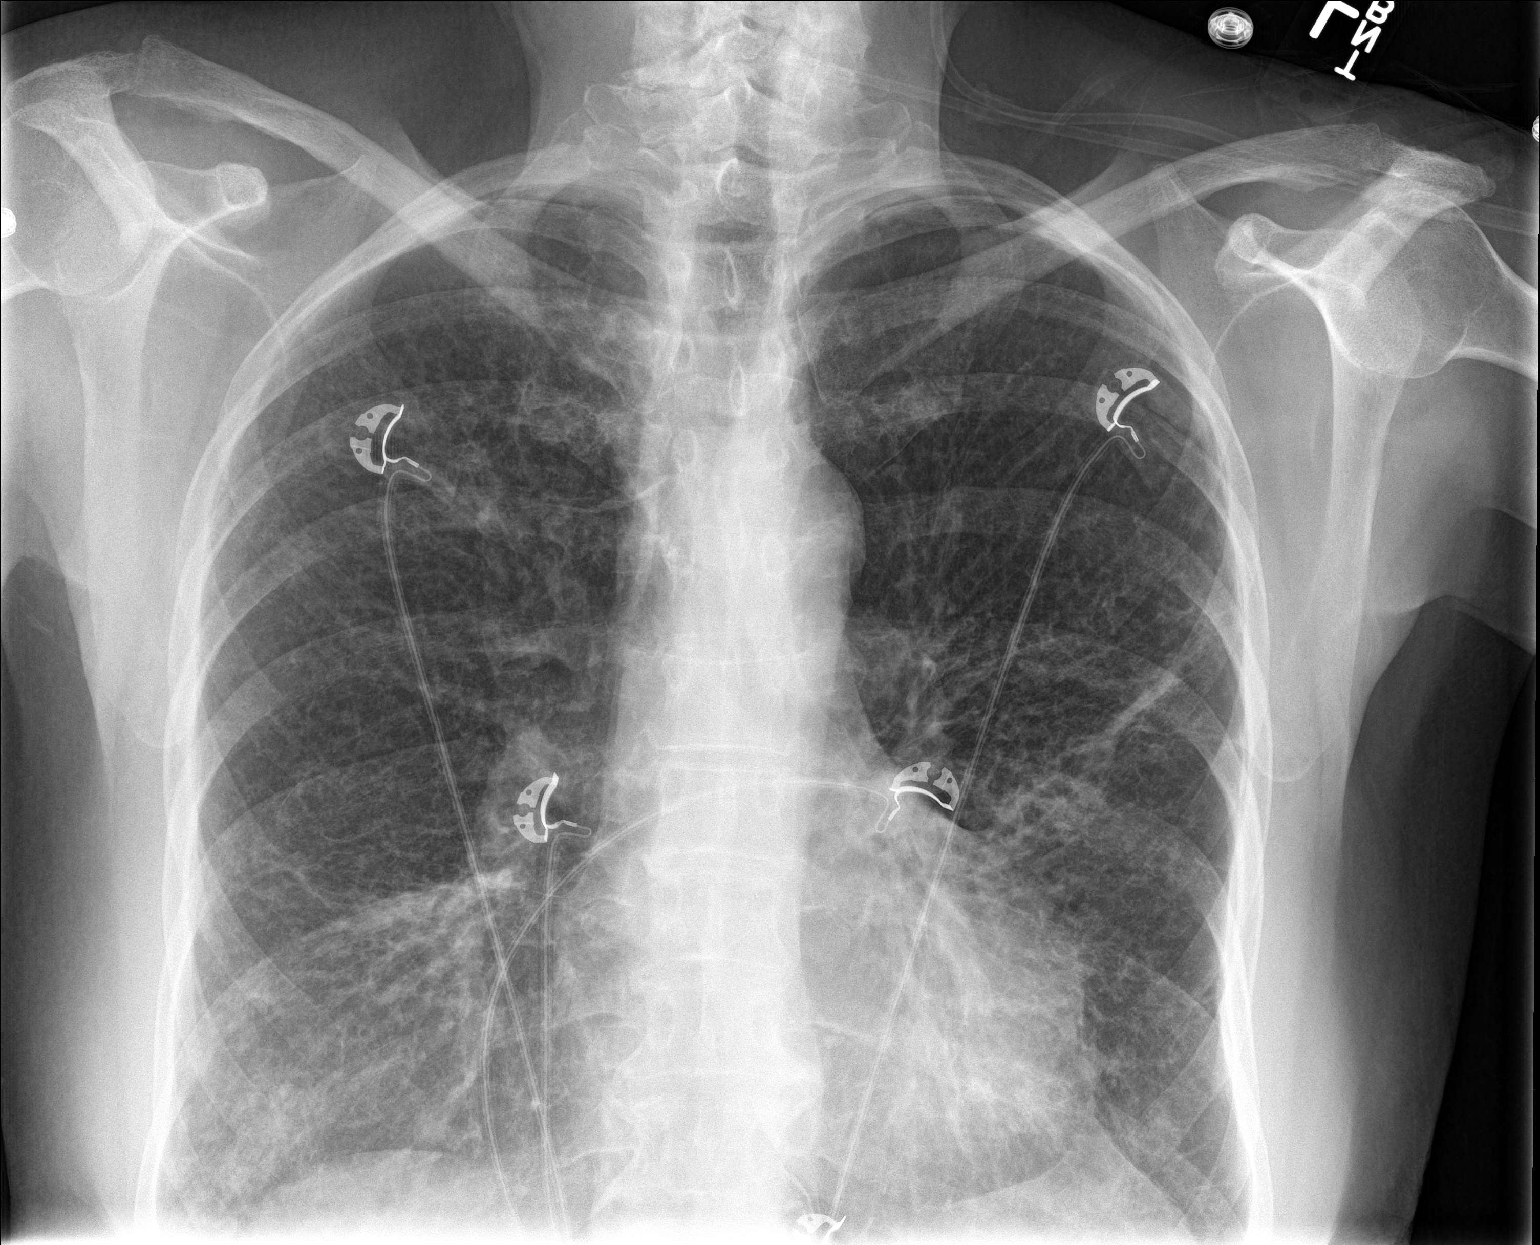

[chest lat]
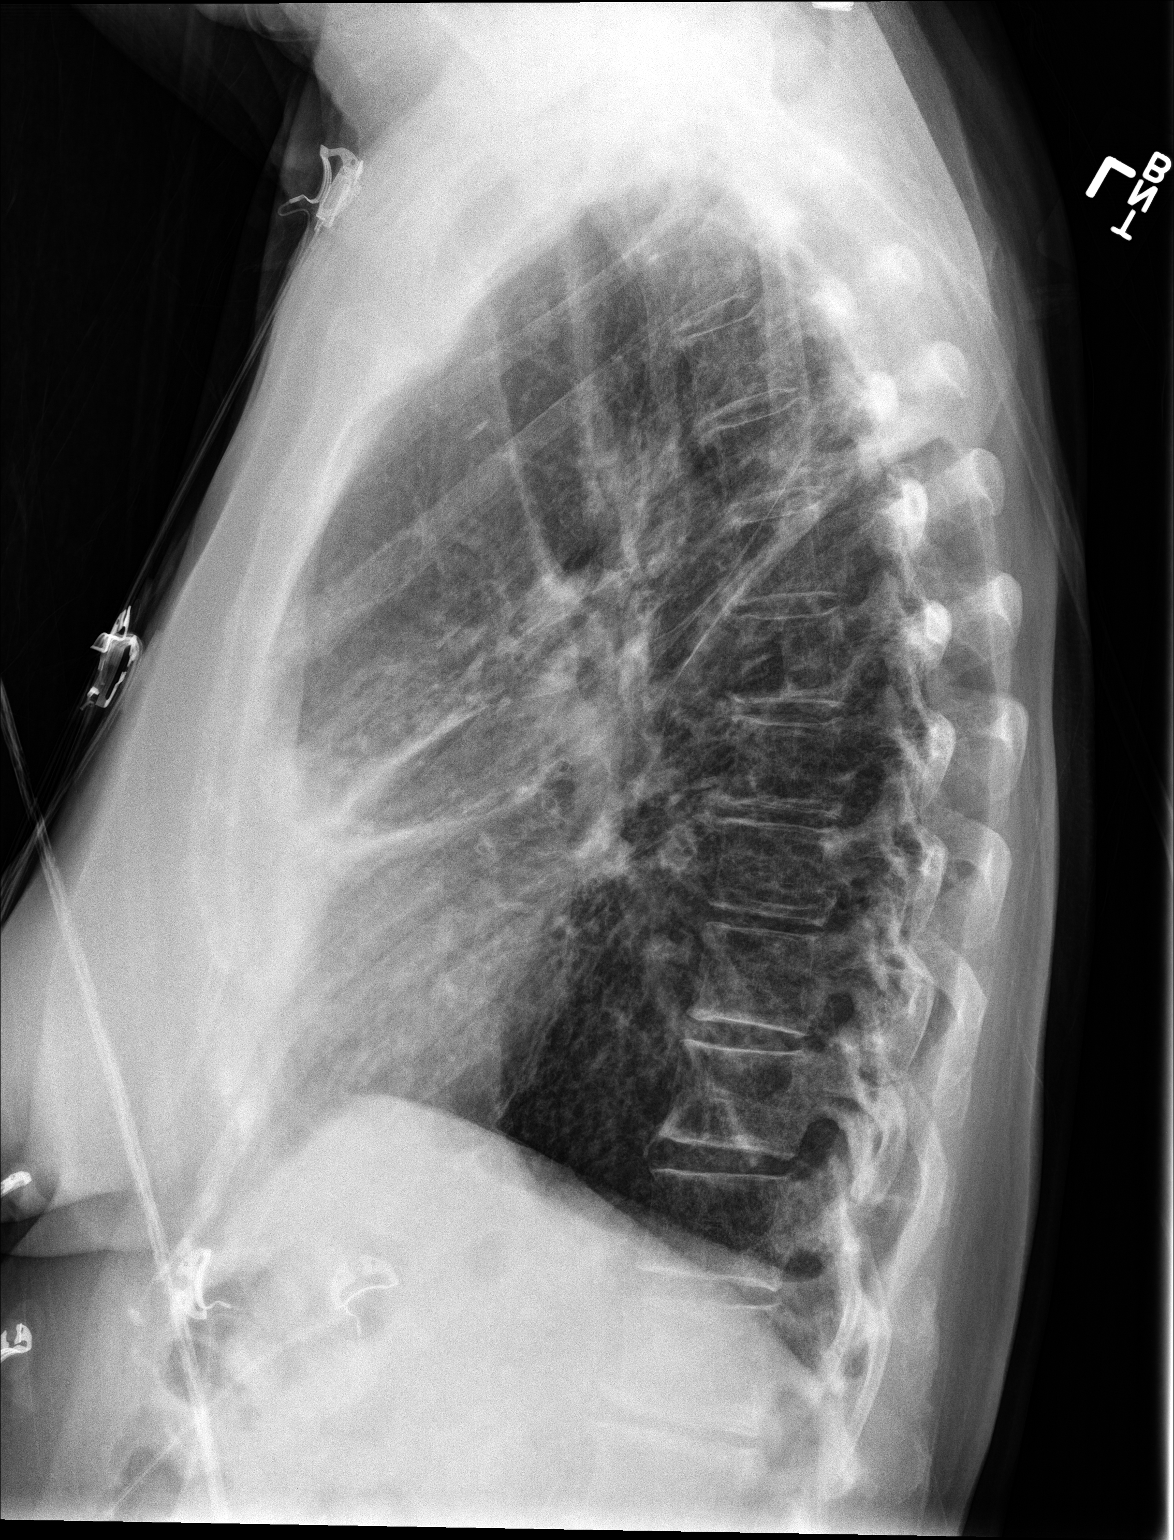

[chest ap]
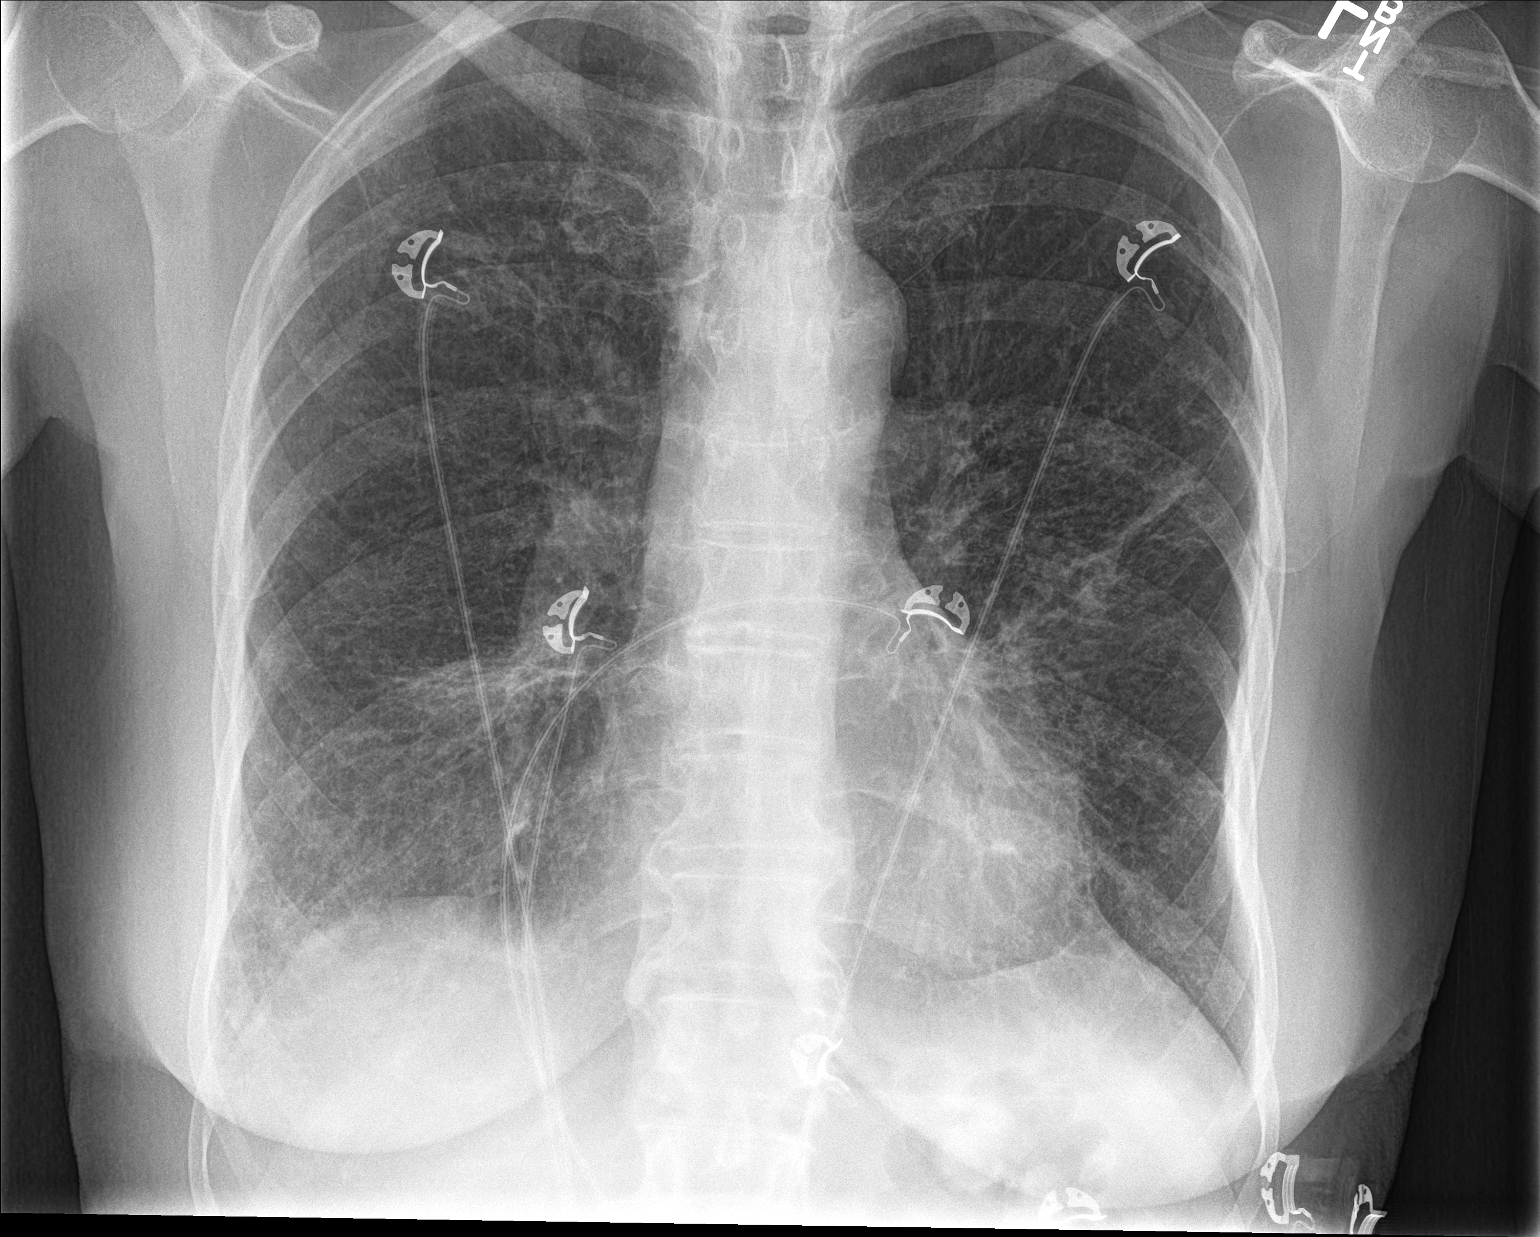

[3 of 3 positions shown; findings below may reference images not displayed]

FINDINGS: Again noted hyperinflation. Stable reticular nodular opacities right
lower lobe in lingula with bronchiectasis and scarring. Again noted
bandlike scarring superior segment of right lower lobe. No definite
superimposed infiltrate or pulmonary edema. Mild thoracic spine
osteopenia.
IMPRESSION: Again noted hyperinflation and chronic lung disease with chronic
reticular nodular interstitial prominence and bronchiectasis
especially in right lower lobe and lingula. No definite superimposed
infiltrate or pulmonary edema

## 2016-08-17 IMAGING — CT CT HEAD W/O CM
1 series · 16 of 30 positions shown, 20 images · non-contrast
Comparison: None.

CLINICAL DATA: Erratic behavior with altered mental status.
Alzheimer's dementia

EXAM:
CT HEAD WITHOUT CONTRAST
TECHNIQUE: Contiguous axial images were obtained from the base of the skull
through the vertex without intravenous contrast.

[Series 2: headseq 4.8 h37s · axial · 0.43mm/px · z∈[+1320,+1477]mm · 16 of 35 slices shown, 20 images]
[im 2/35  brain]
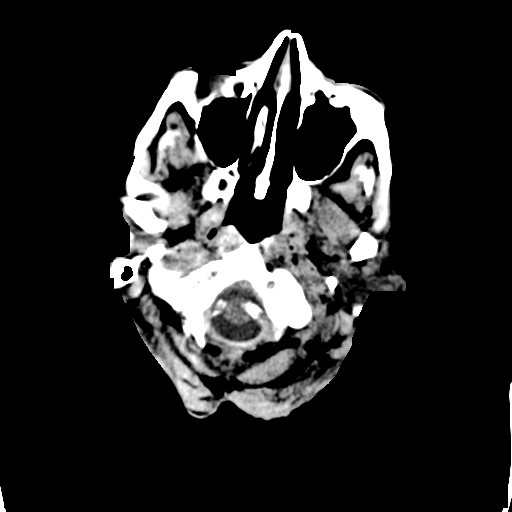
[im 2/35  bone]
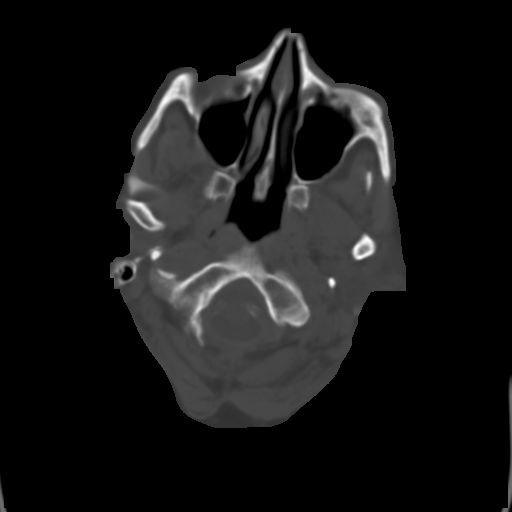
[im 4/35  brain]
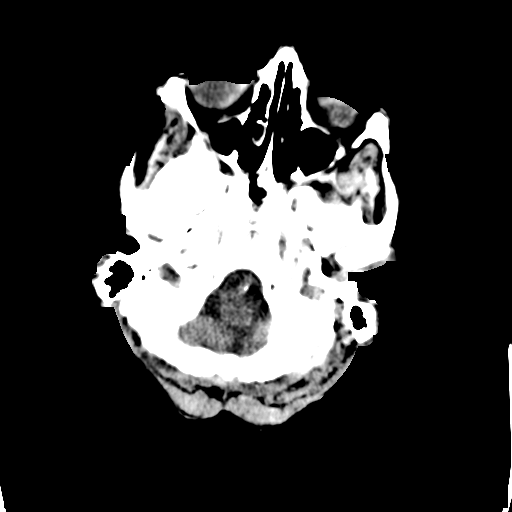
[im 6/35  brain]
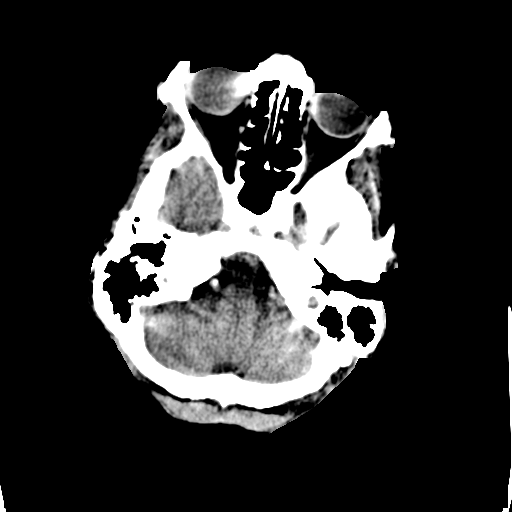
[im 9/35  brain]
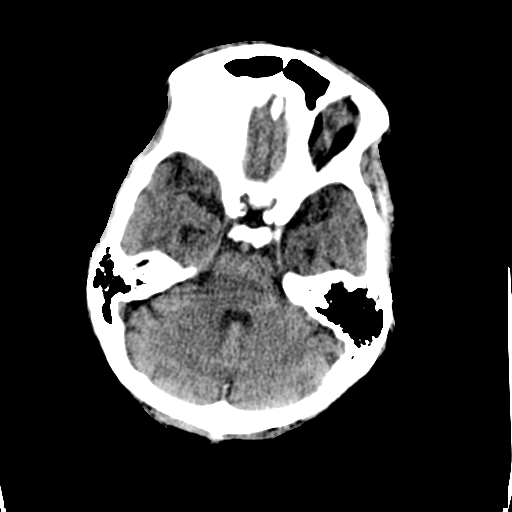
[im 10/35  brain]
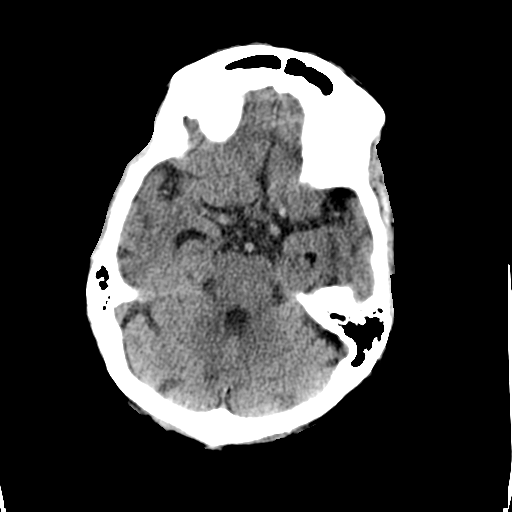
[im 10/35  bone]
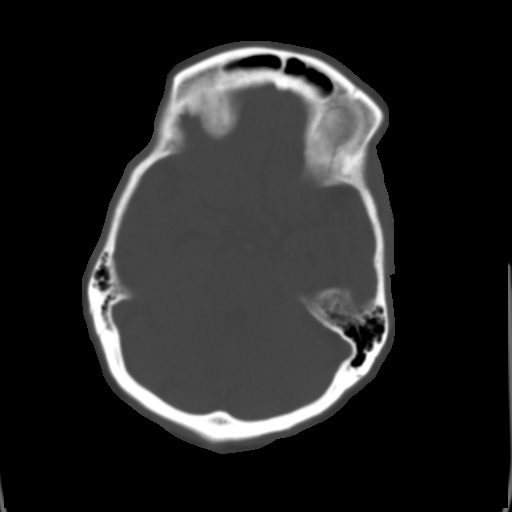
[im 12/35  brain]
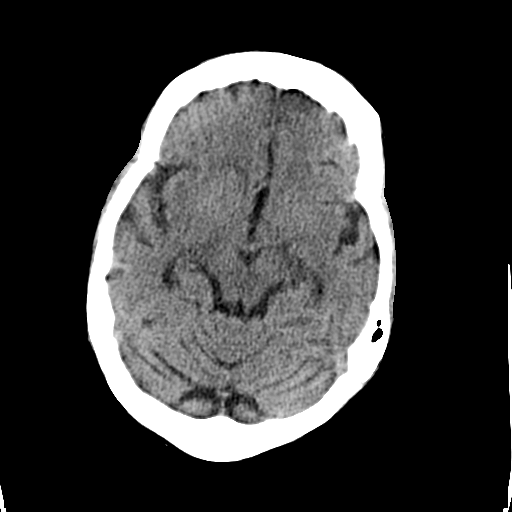
[im 15/35  brain]
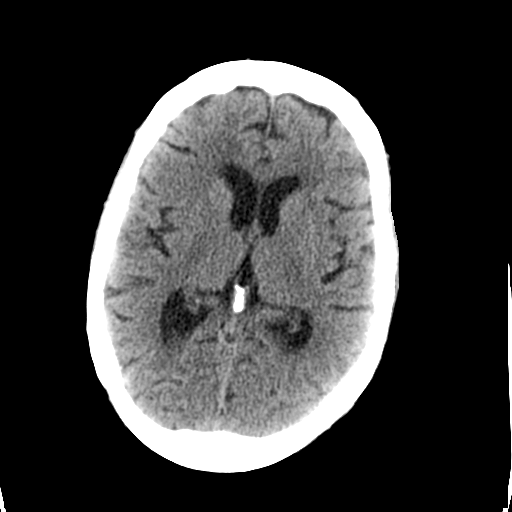
[im 17/35  brain]
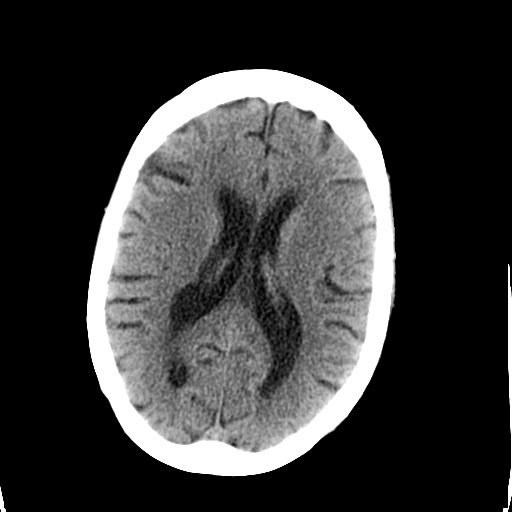
[im 18/35  brain]
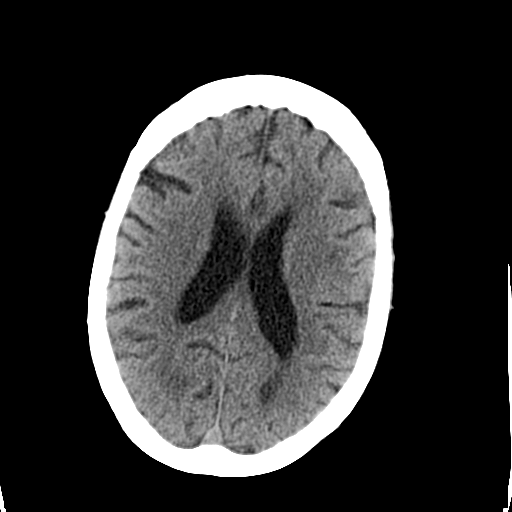
[im 18/35  bone]
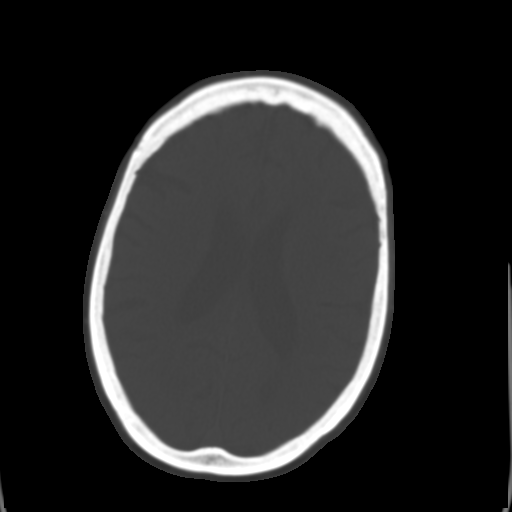
[im 20/35  brain]
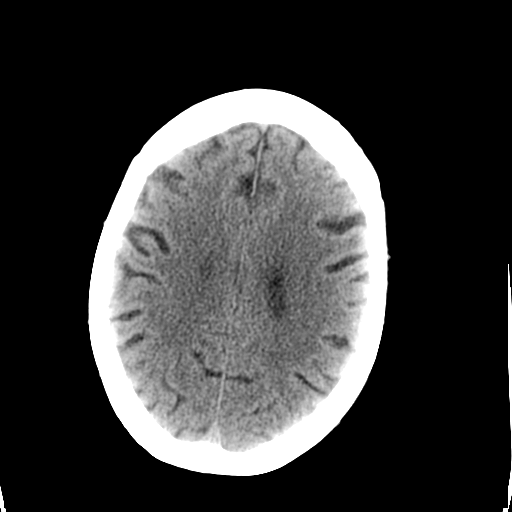
[im 23/35  brain]
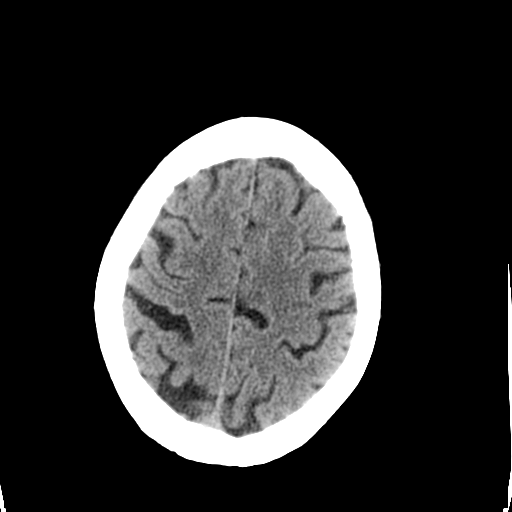
[im 25/35  brain]
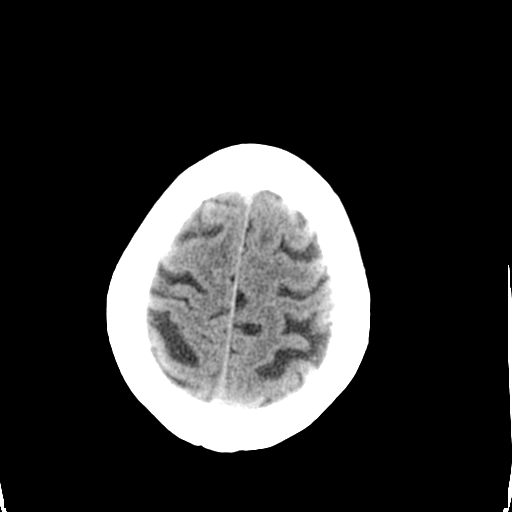
[im 26/35  brain]
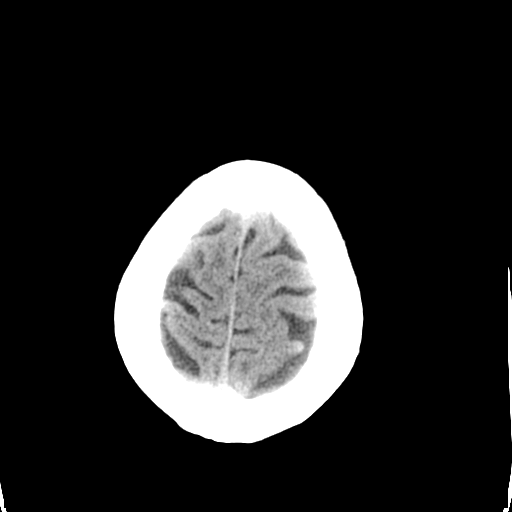
[im 26/35  bone]
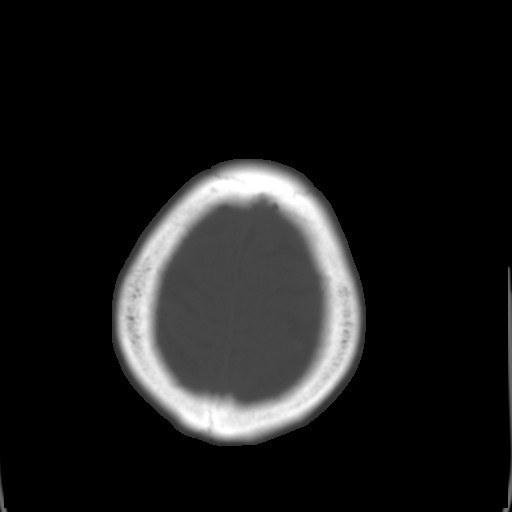
[im 29/35  brain]
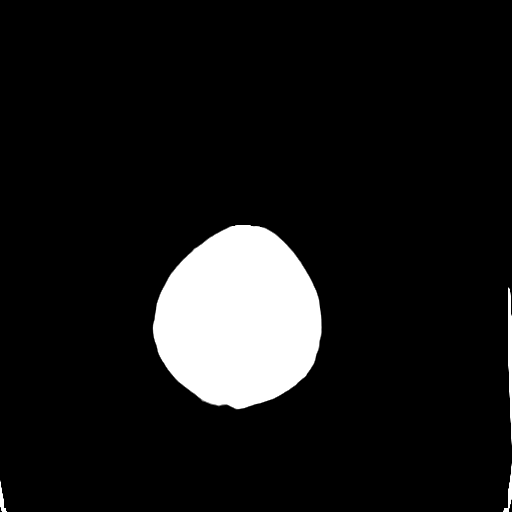
[im 31/35  brain]
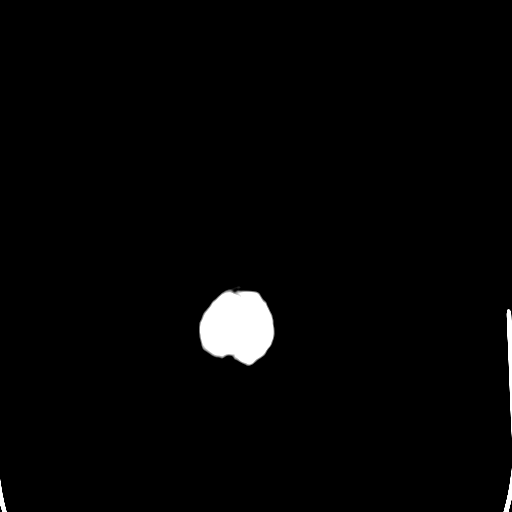
[im 33/35  brain]
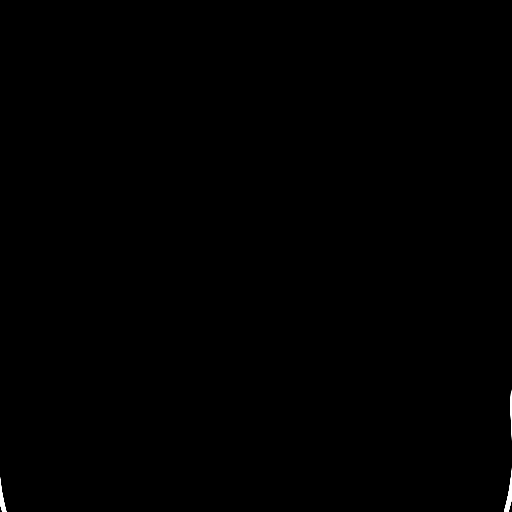

[16 of 30 positions shown; findings below may reference images not displayed]

FINDINGS: There is mild diffuse atrophy. There is no intracranial mass,
hemorrhage, extra-axial fluid collection, or midline shift. There is
mild small vessel disease in the centra semiovale bilaterally.
Elsewhere, gray-white compartments appear normal. No acute infarct
evident. Bony calvarium appears intact. The mastoid air cells are
clear. No intraorbital lesions are identified.
IMPRESSION: Mild atrophy with mild periventricular small vessel disease. No
intracranial mass, hemorrhage, or evidence of acute infarct.

## 2016-10-06 ENCOUNTER — Encounter (HOSPITAL_COMMUNITY): Payer: Self-pay

## 2016-10-06 ENCOUNTER — Emergency Department (HOSPITAL_COMMUNITY): Payer: PPO

## 2016-10-06 ENCOUNTER — Emergency Department (HOSPITAL_COMMUNITY)
Admission: EM | Admit: 2016-10-06 | Discharge: 2016-10-07 | Disposition: A | Discharge status: Home / Self Care | Payer: PPO | Attending: Emergency Medicine | Admitting: Emergency Medicine

## 2016-10-06 DIAGNOSIS — F0390 Unspecified dementia without behavioral disturbance: Secondary | ICD-10-CM | POA: Diagnosis not present

## 2016-10-06 DIAGNOSIS — Z7901 Long term (current) use of anticoagulants: Secondary | ICD-10-CM | POA: Insufficient documentation

## 2016-10-06 DIAGNOSIS — G309 Alzheimer's disease, unspecified: Secondary | ICD-10-CM | POA: Insufficient documentation

## 2016-10-06 DIAGNOSIS — J449 Chronic obstructive pulmonary disease, unspecified: Secondary | ICD-10-CM | POA: Diagnosis not present

## 2016-10-06 DIAGNOSIS — R079 Chest pain, unspecified: Secondary | ICD-10-CM | POA: Diagnosis not present

## 2016-10-06 DIAGNOSIS — F0281 Dementia in other diseases classified elsewhere with behavioral disturbance: Secondary | ICD-10-CM | POA: Insufficient documentation

## 2016-10-06 DIAGNOSIS — Z79899 Other long term (current) drug therapy: Secondary | ICD-10-CM | POA: Insufficient documentation

## 2016-10-06 DIAGNOSIS — R7989 Other specified abnormal findings of blood chemistry: Secondary | ICD-10-CM | POA: Diagnosis not present

## 2016-10-06 DIAGNOSIS — I10 Essential (primary) hypertension: Secondary | ICD-10-CM | POA: Diagnosis not present

## 2016-10-06 DIAGNOSIS — R0789 Other chest pain: Secondary | ICD-10-CM | POA: Diagnosis not present

## 2016-10-06 LAB — CBC WITH DIFFERENTIAL/PLATELET
BASOS ABS: 0 10*3/uL (ref 0.0–0.1)
Basophils Relative: 0 %
EOS PCT: 5 %
Eosinophils Absolute: 0.4 10*3/uL (ref 0.0–0.7)
HEMATOCRIT: 36.4 % (ref 36.0–46.0)
Hemoglobin: 12.2 g/dL (ref 12.0–15.0)
LYMPHS ABS: 1.4 10*3/uL (ref 0.7–4.0)
LYMPHS PCT: 21 %
MCH: 32.9 pg (ref 26.0–34.0)
MCHC: 33.5 g/dL (ref 30.0–36.0)
MCV: 98.1 fL (ref 78.0–100.0)
Monocytes Absolute: 0.6 10*3/uL (ref 0.1–1.0)
Monocytes Relative: 9 %
NEUTROS ABS: 4.3 10*3/uL (ref 1.7–7.7)
Neutrophils Relative %: 65 %
PLATELETS: 167 10*3/uL (ref 150–400)
RBC: 3.71 MIL/uL — AB (ref 3.87–5.11)
RDW: 14.4 % (ref 11.5–15.5)
WBC: 6.6 10*3/uL (ref 4.0–10.5)

## 2016-10-06 LAB — COMPREHENSIVE METABOLIC PANEL
ALT: 15 U/L (ref 14–54)
AST: 21 U/L (ref 15–41)
Albumin: 3.3 g/dL — ABNORMAL LOW (ref 3.5–5.0)
Alkaline Phosphatase: 44 U/L (ref 38–126)
Anion gap: 11 (ref 5–15)
BILIRUBIN TOTAL: 0.6 mg/dL (ref 0.3–1.2)
BUN: 21 mg/dL — AB (ref 6–20)
CHLORIDE: 105 mmol/L (ref 101–111)
CO2: 24 mmol/L (ref 22–32)
CREATININE: 1.13 mg/dL — AB (ref 0.44–1.00)
Calcium: 9 mg/dL (ref 8.9–10.3)
GFR, EST AFRICAN AMERICAN: 50 mL/min — AB (ref >60)
GFR, EST NON AFRICAN AMERICAN: 43 mL/min — AB (ref >60)
Glucose, Bld: 102 mg/dL — ABNORMAL HIGH (ref 65–99)
Potassium: 3.9 mmol/L (ref 3.5–5.1)
Sodium: 140 mmol/L (ref 135–145)
TOTAL PROTEIN: 6 g/dL — AB (ref 6.5–8.1)

## 2016-10-06 LAB — I-STAT TROPONIN, ED: TROPONIN I, POC: 0 ng/mL (ref 0.00–0.08)

## 2016-10-06 MED ORDER — SODIUM CHLORIDE 0.9 % IV BOLUS (SEPSIS)
500.0000 mL | Freq: Once | INTRAVENOUS | Status: AC
  Administered 2016-10-06: 500 mL via INTRAVENOUS

## 2016-10-06 NOTE — ED Triage Notes (Signed)
[facility] of CP. Per EMS, pt was in the middle of a routine breathing treatment when she began c/o Cp. Per EMS, pt denied CP en route, refused 12-lead EKG, and stated "she did not want to come to the hospital". Pt reports central chest tightness at this time with movement but denies N/V, SOB, or diaphoresis. BP 122/64, HR 82 normal sinus, 96% on RA. Pt alert and oriented to self.

## 2016-10-06 NOTE — ED Notes (Signed)
Pt. returned from XR. 

## 2016-10-06 NOTE — ED Notes (Signed)
Pt initially refused 12-lead but was redirected and agreed

## 2016-10-06 NOTE — ED Notes (Addendum)
Pt repeatedly confused, stating she isn't sure where her husband is, and asking "when her surgery will be". Pt easily redirected and oriented to current situation.

## 2016-10-06 NOTE — ED Provider Notes (Signed)
[facility]-EMERGENCY DEPT Provider Note   CSN: [id number] Arrival date & time: 10/06/16  2143     History   Chief Complaint Chief Complaint  Patient presents with  . Chest Pain    HPI Stacie Wright is a 83 y.o. female with a hx of dementia, anxiety, COPD, DVT (on Eliquis), HTN, IBS presents to the Emergency Department complaining of gradual, persistent, progressively worsening cental chest pain onset earlier tonight while receiving Treatment. Patient describes the pain as burning in nature. She denies radiation. Patient states she has no chest pain now. No known associated symptoms. She denies nausea, vomiting or diaphoresis. No known aggregate waiting or alleviating factors. No treatments prior to arrival.  Level V caveat for dementia.  The history is provided by the patient, the EMS personnel and medical records. No language interpreter was used.    Past Medical History:  Diagnosis Date  . Alzheimer's dementia   . Anxiety   . Arthritis   . Bronchiectasis (HCC) 2014  . COPD (chronic obstructive pulmonary disease) (HCC)   . DVT (deep venous thrombosis) (HCC) 2008   LLE  . Gout   . Headache(784.0)   . Hypertension   . IBS (irritable bowel syndrome)   . MAI (mycobacterium avium-intracellulare) infection (HCC) 2014  . Mold exposure 06/20/2015  . Pneumonia 04/2013  . Shortness of breath     Patient Active Problem List   Diagnosis Date Noted  . Obstructive bronchiectasis (HCC) 11/01/2015  . Bronchiectasis (HCC) 10/11/2015  . Acute respiratory failure with hypoxia (HCC) 10/11/2015  . Acute exacerbation of bronchiectasis (HCC) 10/11/2015  . Mold exposure 06/20/2015  . Generalized anxiety disorder 01/19/2015  . Depression 01/19/2015  . Dementia in Alzheimer's disease 01/19/2015  . Mycobacterium avium-intracellulare infection (HCC) 01/18/2015  . Pneumonia 01/15/2015  . Cellulitis 06/09/2013  . Infected hardware in left leg (HCC) 06/09/2013  . Constipation 04/27/2013  .  Pneumonia, organism unspecified(486) 04/26/2013  . COPD exacerbation (HCC) 04/26/2013  . Bronchiectasis without acute exacerbation (HCC) 04/26/2013  . Pulmonary nodule 04/26/2013  . Acute bronchitis 04/25/2013  . Abnormal chest x-ray 04/25/2013  . Generalized weakness 04/25/2013  . Hypoxia 04/25/2013  . Hypertension     Past Surgical History:  Procedure Laterality Date  . ABDOMINAL HYSTERECTOMY    . CATARACT EXTRACTION    . CHOLECYSTECTOMY    . FRACTURE SURGERY      OB History    No data available       Home Medications    Prior to Admission medications   Medication Sig Start Date End Date Taking? Authorizing Provider  allopurinol (ZYLOPRIM) 300 MG tablet Take 300 mg by mouth daily.   Yes Historical Provider, MD  ALPRAZolam Prudy Feeler) 0.5 MG tablet Take 0.5 mg by mouth every 6 (six) hours as needed for anxiety.   Yes Historical Provider, MD  apixaban (ELIQUIS) 5 MG TABS tablet Take 2 tablets (10 mg total) by mouth 2 (two) times daily. Patient taking differently: Take 2.5 mg by mouth 2 (two) times daily.  01/11/16  Yes Jacalyn Lefevre, MD  arformoterol (BROVANA) 15 MCG/2ML NEBU Take 15 mcg by nebulization daily.   Yes Historical Provider, MD  azithromycin (ZITHROMAX) 500 MG tablet Take 1 tablet (500 mg total) by mouth 3 (three) times a week. Patient taking differently: Take 500 mg by mouth every Monday, Wednesday, and Friday.  11/06/15  Yes Randall Hiss, MD  dorzolamide (TRUSOPT) 2 % ophthalmic solution Place 1 drop into both eyes 2 (two) times daily.  Yes Historical Provider, MD  ethambutol (MYAMBUTOL) 400 MG tablet Take 4 tablets (1,600 mg total) by mouth 3 (three) times a week. Patient taking differently: Take 1,600 mg by mouth every Monday, Wednesday, and Friday.  10/03/15  Yes Randall Hiss, MD  latanoprost (XALATAN) 0.005 % ophthalmic solution Place 1 drop into both eyes at bedtime.   Yes Historical Provider, MD  metoprolol tartrate (LOPRESSOR) 25 MG tablet Take 25  mg by mouth daily.   Yes Historical Provider, MD  PARoxetine (PAXIL) 20 MG tablet Take 20 mg by mouth daily.   Yes Historical Provider, MD  rifampin (RIFADIN) 300 MG capsule Take 2 capsules (600 mg total) by mouth 3 (three) times a week. Patient taking differently: Take 600 mg by mouth every Monday, Wednesday, and Friday.  10/03/15  Yes Randall Hiss, MD  risperiDONE (RISPERDAL) 1 MG tablet Take 1 mg by mouth at bedtime.   Yes Historical Provider, MD  triamterene-hydrochlorothiazide (MAXZIDE-25) 37.5-25 MG per tablet Take 1 tablet by mouth daily.    Yes Historical Provider, MD    Family History Family History  Problem Relation Age of Onset  . Diabetes Father   . Hypertension Father   . Rheum arthritis Father   . Lung cancer Sister     smoked    Social History Social History  Substance Use Topics  . Smoking status: Never Smoker  . Smokeless tobacco: Never Used  . Alcohol use No     Allergies   Amoxicillin; Darvocet [propoxyphene n-acetaminophen]; Penicillins; Sulfa antibiotics; and Propoxyphene   Review of Systems Review of Systems  Unable to perform ROS: Dementia  Cardiovascular: Positive for chest pain.     Physical Exam Updated Vital Signs BP 164/72 (BP Location: Right Arm)   Pulse 76   Temp 98.5 F (36.9 C) (Oral)   Resp 16   Ht 5\' 8"  (1.727 m)   Wt 65.8 kg   SpO2 98%   BMI 22.05 kg/m   Physical Exam  Constitutional: She appears well-developed and well-nourished. No distress.  Awake, alert, nontoxic appearance  HENT:  Head: Normocephalic and atraumatic.  Mouth/Throat: Oropharynx is clear and moist. No oropharyngeal exudate.  Eyes: Conjunctivae are normal. No scleral icterus.  Neck: Normal range of motion. Neck supple.  Cardiovascular: Normal rate, regular rhythm and intact distal pulses.   Pulmonary/Chest: Effort normal and breath sounds normal. No respiratory distress. She has no wheezes.  Equal chest expansion  Abdominal: Soft. Bowel sounds are  normal. She exhibits no mass. There is no tenderness. There is no rebound and no guarding.  Musculoskeletal: Normal range of motion. She exhibits no edema.  Neurological: She is alert.  Speech is clear  Confused and requiring constant redirection Oriented to person only  Moves extremities without ataxia  Skin: Skin is warm and dry. She is not diaphoretic.  Psychiatric: She has a normal mood and affect.  Nursing note and vitals reviewed.    ED Treatments / Results  Labs (all labs ordered are listed, but only abnormal results are displayed) Labs Reviewed  CBC WITH DIFFERENTIAL/PLATELET - Abnormal; Notable for the following:       Result Value   RBC 3.71 (*)    All other components within normal limits  COMPREHENSIVE METABOLIC PANEL - Abnormal; Notable for the following:    Glucose, Bld 102 (*)    BUN 21 (*)    Creatinine, Ser 1.13 (*)    Total Protein 6.0 (*)    Albumin 3.3 (*)  GFR calc non Af Amer 43 (*)    GFR calc Af Amer 50 (*)    All other components within normal limits  I-STAT TROPOININ, ED  I-STAT TROPOININ, ED  I-STAT TROPOININ, ED    EKG  EKG Interpretation  Date/Time:  Monday October 06 2016 22:01:41 EST Ventricular Rate:  71 PR Interval:    QRS Duration: 110 QT Interval:  379 QTC Calculation: 412 R Axis:   86 Text Interpretation:  Sinus rhythm Consider right ventricular hypertrophy Confirmed by YAO  MD, DAVID ([id number]) on 10/06/2016 10:11:23 PM       EKG Interpretation  Date/Time:  Monday October 06 2016 22:01:41 EST Ventricular Rate:  71 PR Interval:    QRS Duration: 110 QT Interval:  379 QTC Calculation: 412 R Axis:   86 Text Interpretation:  Sinus rhythm Consider right ventricular hypertrophy Confirmed by YAO  MD, DAVID ([id number]) on 10/06/2016 10:11:23 PM        Radiology Dg Chest 2 View  Result Date: 10/06/2016 CLINICAL DATA:  83 year old female with cough and congestion. EXAM: CHEST  2 VIEW COMPARISON:  Chest CT dated 01/11/2016 FINDINGS:  Areas of bronchiectatic changes and scarring noted involving the right middle lobe and lingula as seen on the prior CT. There is no focal consolidation, pleural effusion, or pneumothorax. The cardiac silhouette is within normal limits. No acute osseous pathology identified. IMPRESSION: No active cardiopulmonary disease. Bronchiectatic changes and scarring involving the right middle lobe and lingula as seen on the prior CT. Electronically Signed   By: Elgie CollardArash  Radparvar M.D.   On: 10/06/2016 22:33    Procedures Procedures (including critical care time)  Medications Ordered in ED Medications  sodium chloride 0.9 % bolus 500 mL (0 mLs Intravenous Stopped 10/07/16 0107)     Initial Impression / Assessment and Plan / ED Course  I have reviewed the triage vital signs and the nursing notes.  Pertinent labs & imaging results that were available during my care of the patient were reviewed by me and considered in my medical decision making (see chart for details).  Clinical Course as of Oct 07 444  Tue Oct 07, 2016  0411 Slightly elevated, fluid bolus given Creatinine: (!) 1.13 [HM]  0411 VSS BP: 109/57 [HM]  0412 Initial trop 0.00, repeat 0.01 Troponin i, poc: 0.01 [HM]  0415 Bronchiectatic changes and scarring involving the right middle lobe and lingula as seen on the prior CT.   DG Chest 2 View [HM]    Clinical Course User Index [HM] Dierdre ForthHannah Herminia Warren, PA-C    Patient presents with chest pain which began while receiving nebulizer treatment this evening. She has significant dementia and is a very poor historian. She requires consistent redirection. Her time in the emergency department she has continued to state that she is chest pain-free. Vital signs and stable. Question EKG changes in V2 alone. Initial and repeat EKG without worsening or worse apical changes. Initial and repeat troponin are negative.  3rd troponin also negative.  Pt is sleeping soundly.  Pt will need recheck of labs  (creatinine) in 1 week.  Pt to be d/c back to her facility.    The patient was discussed with and seen by Dr. Silverio LayYao who agrees with the treatment plan.   Final Clinical Impressions(s) / ED Diagnoses   Final diagnoses:  Chest pain, unspecified type  Dementia without behavioral disturbance, unspecified dementia type  Elevated serum creatinine    New Prescriptions New Prescriptions   No medications on file  Dahlia Client Meagan Ancona, PA-C 10/07/16 0446    Charlynne Pander, MD 10/07/16 1045

## 2016-10-06 NOTE — ED Notes (Signed)
Patient transported to X-ray 

## 2016-10-07 DIAGNOSIS — R079 Chest pain, unspecified: Secondary | ICD-10-CM | POA: Diagnosis not present

## 2016-10-07 DIAGNOSIS — R4182 Altered mental status, unspecified: Secondary | ICD-10-CM | POA: Diagnosis not present

## 2016-10-07 LAB — I-STAT TROPONIN, ED
TROPONIN I, POC: 0.01 ng/mL (ref 0.00–0.08)
TROPONIN I, POC: 0.01 ng/mL (ref 0.00–0.08)

## 2016-10-07 NOTE — ED Notes (Addendum)
Per Dahlia ClientHannah, [place], will repeat I-stat troponin at 0400. Will continue to monitor pt.

## 2016-10-07 NOTE — Discharge Instructions (Signed)
1. Medications: usual home medications 2. Treatment: rest, drink plenty of fluids,  3. Follow Up: Please followup with your primary doctor in 1-2 days for discussion of your diagnoses AND RECHECK OF LABS and further evaluation after today's visit; if you do not have a primary care doctor use the resource guide provided to find one; Please return to the ER for return of chest pain, fevers, vomiting or other concerns

## 2016-10-13 ENCOUNTER — Ambulatory Visit: Payer: Self-pay | Admitting: Infectious Disease

## 2016-11-10 ENCOUNTER — Inpatient Hospital Stay (HOSPITAL_COMMUNITY)
Admission: EM | Admit: 2016-11-10 | Discharge: 2016-11-17 | DRG: 871 | Disposition: A | Discharge status: Skilled Nursing Facility | Payer: PPO | Attending: Family Medicine | Admitting: Family Medicine

## 2016-11-10 ENCOUNTER — Encounter (HOSPITAL_COMMUNITY): Payer: Self-pay | Admitting: *Deleted

## 2016-11-10 ENCOUNTER — Emergency Department (HOSPITAL_COMMUNITY): Payer: PPO

## 2016-11-10 DIAGNOSIS — E876 Hypokalemia: Secondary | ICD-10-CM | POA: Diagnosis not present

## 2016-11-10 DIAGNOSIS — I82502 Chronic embolism and thrombosis of unspecified deep veins of left lower extremity: Secondary | ICD-10-CM | POA: Diagnosis not present

## 2016-11-10 DIAGNOSIS — Z86718 Personal history of other venous thrombosis and embolism: Secondary | ICD-10-CM | POA: Diagnosis not present

## 2016-11-10 DIAGNOSIS — J9601 Acute respiratory failure with hypoxia: Secondary | ICD-10-CM | POA: Diagnosis not present

## 2016-11-10 DIAGNOSIS — Y95 Nosocomial condition: Secondary | ICD-10-CM | POA: Diagnosis present

## 2016-11-10 DIAGNOSIS — J1000 Influenza due to other identified influenza virus with unspecified type of pneumonia: Secondary | ICD-10-CM | POA: Diagnosis present

## 2016-11-10 DIAGNOSIS — R059 Cough, unspecified: Secondary | ICD-10-CM

## 2016-11-10 DIAGNOSIS — R6521 Severe sepsis with septic shock: Secondary | ICD-10-CM | POA: Diagnosis not present

## 2016-11-10 DIAGNOSIS — M109 Gout, unspecified: Secondary | ICD-10-CM | POA: Diagnosis not present

## 2016-11-10 DIAGNOSIS — R05 Cough: Secondary | ICD-10-CM | POA: Diagnosis not present

## 2016-11-10 DIAGNOSIS — Z7901 Long term (current) use of anticoagulants: Secondary | ICD-10-CM

## 2016-11-10 DIAGNOSIS — Z888 Allergy status to other drugs, medicaments and biological substances status: Secondary | ICD-10-CM

## 2016-11-10 DIAGNOSIS — J811 Chronic pulmonary edema: Secondary | ICD-10-CM | POA: Diagnosis not present

## 2016-11-10 DIAGNOSIS — M6281 Muscle weakness (generalized): Secondary | ICD-10-CM | POA: Diagnosis not present

## 2016-11-10 DIAGNOSIS — E872 Acidosis, unspecified: Secondary | ICD-10-CM | POA: Insufficient documentation

## 2016-11-10 DIAGNOSIS — R1312 Dysphagia, oropharyngeal phase: Secondary | ICD-10-CM | POA: Diagnosis not present

## 2016-11-10 DIAGNOSIS — J470 Bronchiectasis with acute lower respiratory infection: Secondary | ICD-10-CM | POA: Diagnosis not present

## 2016-11-10 DIAGNOSIS — J1100 Influenza due to unidentified influenza virus with unspecified type of pneumonia: Secondary | ICD-10-CM | POA: Diagnosis not present

## 2016-11-10 DIAGNOSIS — Z801 Family history of malignant neoplasm of trachea, bronchus and lung: Secondary | ICD-10-CM

## 2016-11-10 DIAGNOSIS — J471 Bronchiectasis with (acute) exacerbation: Secondary | ICD-10-CM

## 2016-11-10 DIAGNOSIS — N179 Acute kidney failure, unspecified: Secondary | ICD-10-CM | POA: Diagnosis not present

## 2016-11-10 DIAGNOSIS — Z8261 Family history of arthritis: Secondary | ICD-10-CM | POA: Diagnosis not present

## 2016-11-10 DIAGNOSIS — F411 Generalized anxiety disorder: Secondary | ICD-10-CM | POA: Diagnosis present

## 2016-11-10 DIAGNOSIS — J9690 Respiratory failure, unspecified, unspecified whether with hypoxia or hypercapnia: Secondary | ICD-10-CM

## 2016-11-10 DIAGNOSIS — Z882 Allergy status to sulfonamides status: Secondary | ICD-10-CM

## 2016-11-10 DIAGNOSIS — G309 Alzheimer's disease, unspecified: Secondary | ICD-10-CM | POA: Diagnosis present

## 2016-11-10 DIAGNOSIS — J189 Pneumonia, unspecified organism: Secondary | ICD-10-CM | POA: Diagnosis not present

## 2016-11-10 DIAGNOSIS — Z7189 Other specified counseling: Secondary | ICD-10-CM | POA: Diagnosis not present

## 2016-11-10 DIAGNOSIS — Z66 Do not resuscitate: Secondary | ICD-10-CM | POA: Diagnosis not present

## 2016-11-10 DIAGNOSIS — R069 Unspecified abnormalities of breathing: Secondary | ICD-10-CM | POA: Diagnosis not present

## 2016-11-10 DIAGNOSIS — Z79899 Other long term (current) drug therapy: Secondary | ICD-10-CM | POA: Diagnosis not present

## 2016-11-10 DIAGNOSIS — A419 Sepsis, unspecified organism: Secondary | ICD-10-CM | POA: Diagnosis not present

## 2016-11-10 DIAGNOSIS — Z833 Family history of diabetes mellitus: Secondary | ICD-10-CM | POA: Diagnosis not present

## 2016-11-10 DIAGNOSIS — J449 Chronic obstructive pulmonary disease, unspecified: Secondary | ICD-10-CM | POA: Diagnosis not present

## 2016-11-10 DIAGNOSIS — Z8249 Family history of ischemic heart disease and other diseases of the circulatory system: Secondary | ICD-10-CM

## 2016-11-10 DIAGNOSIS — A4189 Other specified sepsis: Secondary | ICD-10-CM | POA: Diagnosis not present

## 2016-11-10 DIAGNOSIS — I248 Other forms of acute ischemic heart disease: Secondary | ICD-10-CM | POA: Diagnosis present

## 2016-11-10 DIAGNOSIS — R488 Other symbolic dysfunctions: Secondary | ICD-10-CM | POA: Diagnosis not present

## 2016-11-10 DIAGNOSIS — R531 Weakness: Secondary | ICD-10-CM | POA: Diagnosis not present

## 2016-11-10 DIAGNOSIS — Z880 Allergy status to penicillin: Secondary | ICD-10-CM

## 2016-11-10 DIAGNOSIS — F0280 Dementia in other diseases classified elsewhere without behavioral disturbance: Secondary | ICD-10-CM | POA: Diagnosis not present

## 2016-11-10 DIAGNOSIS — I10 Essential (primary) hypertension: Secondary | ICD-10-CM | POA: Diagnosis not present

## 2016-11-10 DIAGNOSIS — J9600 Acute respiratory failure, unspecified whether with hypoxia or hypercapnia: Secondary | ICD-10-CM | POA: Diagnosis not present

## 2016-11-10 DIAGNOSIS — J90 Pleural effusion, not elsewhere classified: Secondary | ICD-10-CM | POA: Diagnosis not present

## 2016-11-10 DIAGNOSIS — R0602 Shortness of breath: Secondary | ICD-10-CM | POA: Diagnosis not present

## 2016-11-10 DIAGNOSIS — J479 Bronchiectasis, uncomplicated: Secondary | ICD-10-CM | POA: Diagnosis not present

## 2016-11-10 LAB — CBC WITH DIFFERENTIAL/PLATELET
BASOS ABS: 0 10*3/uL (ref 0.0–0.1)
Basophils Relative: 0 %
Eosinophils Absolute: 0 10*3/uL (ref 0.0–0.7)
Eosinophils Relative: 0 %
HEMATOCRIT: 38.4 % (ref 36.0–46.0)
HEMOGLOBIN: 12.5 g/dL (ref 12.0–15.0)
LYMPHS PCT: 4 %
Lymphs Abs: 0.8 10*3/uL (ref 0.7–4.0)
MCH: 32.7 pg (ref 26.0–34.0)
MCHC: 32.6 g/dL (ref 30.0–36.0)
MCV: 100.5 fL — AB (ref 78.0–100.0)
Monocytes Absolute: 1.1 10*3/uL — ABNORMAL HIGH (ref 0.1–1.0)
Monocytes Relative: 6 %
NEUTROS ABS: 17.3 10*3/uL — AB (ref 1.7–7.7)
NEUTROS PCT: 90 %
Platelets: 146 10*3/uL — ABNORMAL LOW (ref 150–400)
RBC: 3.82 MIL/uL — ABNORMAL LOW (ref 3.87–5.11)
RDW: 14.5 % (ref 11.5–15.5)
WBC: 19.2 10*3/uL — AB (ref 4.0–10.5)

## 2016-11-10 LAB — BASIC METABOLIC PANEL
ANION GAP: 10 (ref 5–15)
BUN: 29 mg/dL — ABNORMAL HIGH (ref 6–20)
CO2: 22 mmol/L (ref 22–32)
Calcium: 8 mg/dL — ABNORMAL LOW (ref 8.9–10.3)
Chloride: 106 mmol/L (ref 101–111)
Creatinine, Ser: 1.4 mg/dL — ABNORMAL HIGH (ref 0.44–1.00)
GFR, EST AFRICAN AMERICAN: 38 mL/min — AB (ref >60)
GFR, EST NON AFRICAN AMERICAN: 33 mL/min — AB (ref >60)
Glucose, Bld: 185 mg/dL — ABNORMAL HIGH (ref 65–99)
POTASSIUM: 4.8 mmol/L (ref 3.5–5.1)
SODIUM: 138 mmol/L (ref 135–145)

## 2016-11-10 LAB — URINALYSIS, ROUTINE W REFLEX MICROSCOPIC
BACTERIA UA: NONE SEEN
BILIRUBIN URINE: NEGATIVE
Glucose, UA: 50 mg/dL — AB
Hgb urine dipstick: NEGATIVE
Ketones, ur: NEGATIVE mg/dL
NITRITE: NEGATIVE
Protein, ur: NEGATIVE mg/dL
Specific Gravity, Urine: 1.017 (ref 1.005–1.030)
pH: 5 (ref 5.0–8.0)

## 2016-11-10 LAB — COMPREHENSIVE METABOLIC PANEL
ALT: 10 U/L — AB (ref 14–54)
ANION GAP: 13 (ref 5–15)
AST: 45 U/L — ABNORMAL HIGH (ref 15–41)
Albumin: 3.6 g/dL (ref 3.5–5.0)
Alkaline Phosphatase: 33 U/L — ABNORMAL LOW (ref 38–126)
BUN: 29 mg/dL — ABNORMAL HIGH (ref 6–20)
CO2: 22 mmol/L (ref 22–32)
CREATININE: 1.61 mg/dL — AB (ref 0.44–1.00)
Calcium: 8.7 mg/dL — ABNORMAL LOW (ref 8.9–10.3)
Chloride: 101 mmol/L (ref 101–111)
GFR calc non Af Amer: 28 mL/min — ABNORMAL LOW (ref >60)
GFR, EST AFRICAN AMERICAN: 32 mL/min — AB (ref >60)
Glucose, Bld: 159 mg/dL — ABNORMAL HIGH (ref 65–99)
POTASSIUM: 5.6 mmol/L — AB (ref 3.5–5.1)
SODIUM: 136 mmol/L (ref 135–145)
Total Bilirubin: 1.6 mg/dL — ABNORMAL HIGH (ref 0.3–1.2)
Total Protein: 6.3 g/dL — ABNORMAL LOW (ref 6.5–8.1)

## 2016-11-10 LAB — I-STAT VENOUS BLOOD GAS, ED
ACID-BASE DEFICIT: 3 mmol/L — AB (ref 0.0–2.0)
Bicarbonate: 26 mmol/L (ref 20.0–28.0)
O2 Saturation: 30 %
TCO2: 28 mmol/L (ref 0–100)
pCO2, Ven: 64.6 mmHg — ABNORMAL HIGH (ref 44.0–60.0)
pH, Ven: 7.212 — ABNORMAL LOW (ref 7.250–7.430)
pO2, Ven: 24 mmHg — CL (ref 32.0–45.0)

## 2016-11-10 LAB — POCT I-STAT 3, ART BLOOD GAS (G3+)
ACID-BASE EXCESS: 1 mmol/L (ref 0.0–2.0)
BICARBONATE: 25.9 mmol/L (ref 20.0–28.0)
O2 SAT: 100 %
PH ART: 7.408 (ref 7.350–7.450)
PO2 ART: 310 mmHg — AB (ref 83.0–108.0)
TCO2: 27 mmol/L (ref 0–100)
pCO2 arterial: 41 mmHg (ref 32.0–48.0)

## 2016-11-10 LAB — MRSA PCR SCREENING: MRSA by PCR: NEGATIVE

## 2016-11-10 LAB — I-STAT TROPONIN, ED: Troponin i, poc: 0.32 ng/mL (ref 0.00–0.08)

## 2016-11-10 LAB — INFLUENZA PANEL BY PCR (TYPE A & B)
INFLBPCR: NEGATIVE
Influenza A By PCR: POSITIVE — AB

## 2016-11-10 LAB — TROPONIN I
TROPONIN I: 0.33 ng/mL — AB (ref <0.03)
TROPONIN I: 0.31 ng/mL — AB (ref <0.03)

## 2016-11-10 LAB — LIPASE, BLOOD: Lipase: 26 U/L (ref 11–51)

## 2016-11-10 LAB — I-STAT CG4 LACTIC ACID, ED: LACTIC ACID, VENOUS: 5.53 mmol/L — AB (ref 0.5–1.9)

## 2016-11-10 LAB — BRAIN NATRIURETIC PEPTIDE: B NATRIURETIC PEPTIDE 5: 299.1 pg/mL — AB (ref 0.0–100.0)

## 2016-11-10 LAB — LACTIC ACID, PLASMA: LACTIC ACID, VENOUS: 2.9 mmol/L — AB (ref 0.5–1.9)

## 2016-11-10 MED ORDER — ALPRAZOLAM 0.25 MG PO TABS
0.2500 mg | ORAL_TABLET | Freq: Once | ORAL | Status: AC
  Administered 2016-11-10: 0.25 mg via ORAL
  Filled 2016-11-10: qty 1

## 2016-11-10 MED ORDER — DEXTROSE 5 % IV SOLN
1.0000 g | INTRAVENOUS | Status: DC
  Administered 2016-11-10 – 2016-11-13 (×4): 1 g via INTRAVENOUS
  Filled 2016-11-10 (×4): qty 1

## 2016-11-10 MED ORDER — SODIUM CHLORIDE 0.9 % IV SOLN
1500.0000 mg | Freq: Once | INTRAVENOUS | Status: AC
  Administered 2016-11-10: 1500 mg via INTRAVENOUS
  Filled 2016-11-10: qty 1500

## 2016-11-10 MED ORDER — PAROXETINE HCL 20 MG PO TABS
20.0000 mg | ORAL_TABLET | Freq: Every day | ORAL | Status: DC
  Administered 2016-11-11 – 2016-11-17 (×7): 20 mg via ORAL
  Filled 2016-11-10 (×7): qty 1

## 2016-11-10 MED ORDER — ARFORMOTEROL TARTRATE 15 MCG/2ML IN NEBU
15.0000 ug | INHALATION_SOLUTION | Freq: Every day | RESPIRATORY_TRACT | Status: DC
  Administered 2016-11-11 – 2016-11-17 (×7): 15 ug via RESPIRATORY_TRACT
  Filled 2016-11-10 (×7): qty 2

## 2016-11-10 MED ORDER — SODIUM CHLORIDE 0.9 % IV BOLUS (SEPSIS)
1000.0000 mL | Freq: Once | INTRAVENOUS | Status: AC
  Administered 2016-11-10: 1000 mL via INTRAVENOUS

## 2016-11-10 MED ORDER — VANCOMYCIN HCL IN DEXTROSE 1-5 GM/200ML-% IV SOLN: 1000.0000 mg | Freq: Once | INTRAVENOUS | Status: DC

## 2016-11-10 MED ORDER — APIXABAN 2.5 MG PO TABS
2.5000 mg | ORAL_TABLET | Freq: Two times a day (BID) | ORAL | Status: DC
  Administered 2016-11-10 – 2016-11-17 (×14): 2.5 mg via ORAL
  Filled 2016-11-10 (×15): qty 1

## 2016-11-10 MED ORDER — VANCOMYCIN HCL IN DEXTROSE 750-5 MG/150ML-% IV SOLN
750.0000 mg | INTRAVENOUS | Status: DC
  Administered 2016-11-11 – 2016-11-12 (×2): 750 mg via INTRAVENOUS
  Filled 2016-11-10 (×2): qty 150

## 2016-11-10 MED ORDER — ACETAMINOPHEN 650 MG RE SUPP: 650.0000 mg | Freq: Four times a day (QID) | RECTAL | Status: DC | PRN

## 2016-11-10 MED ORDER — ACETAMINOPHEN 325 MG PO TABS
650.0000 mg | ORAL_TABLET | Freq: Four times a day (QID) | ORAL | Status: DC | PRN
  Administered 2016-11-10 – 2016-11-16 (×7): 650 mg via ORAL
  Filled 2016-11-10 (×8): qty 2

## 2016-11-10 MED ORDER — SODIUM CHLORIDE 0.9 % IV SOLN
INTRAVENOUS | Status: DC
  Administered 2016-11-10 – 2016-11-13 (×4): via INTRAVENOUS

## 2016-11-10 MED ORDER — LATANOPROST 0.005 % OP SOLN
1.0000 [drp] | Freq: Every day | OPHTHALMIC | Status: DC
  Administered 2016-11-10 – 2016-11-15 (×6): 1 [drp] via OPHTHALMIC
  Filled 2016-11-10: qty 2.5

## 2016-11-10 MED ORDER — DORZOLAMIDE HCL 2 % OP SOLN
1.0000 [drp] | Freq: Two times a day (BID) | OPHTHALMIC | Status: DC
  Administered 2016-11-10 – 2016-11-17 (×13): 1 [drp] via OPHTHALMIC
  Filled 2016-11-10: qty 10

## 2016-11-10 MED ORDER — SODIUM CHLORIDE 0.9 % IV BOLUS (SEPSIS)
500.0000 mL | Freq: Once | INTRAVENOUS | Status: AC
  Administered 2016-11-10: 500 mL via INTRAVENOUS

## 2016-11-10 MED ORDER — ALPRAZOLAM 0.25 MG PO TABS
0.2500 mg | ORAL_TABLET | Freq: Four times a day (QID) | ORAL | Status: DC | PRN
  Administered 2016-11-10 – 2016-11-16 (×11): 0.25 mg via ORAL
  Filled 2016-11-10 (×11): qty 1

## 2016-11-10 MED ORDER — ALLOPURINOL 300 MG PO TABS
300.0000 mg | ORAL_TABLET | Freq: Every day | ORAL | Status: DC
  Administered 2016-11-11 – 2016-11-17 (×7): 300 mg via ORAL
  Filled 2016-11-10 (×7): qty 1

## 2016-11-10 MED ORDER — BENZONATATE 100 MG PO CAPS
100.0000 mg | ORAL_CAPSULE | Freq: Three times a day (TID) | ORAL | Status: DC
  Administered 2016-11-10 – 2016-11-16 (×16): 100 mg via ORAL
  Filled 2016-11-10 (×18): qty 1

## 2016-11-10 MED ORDER — GUAIFENESIN ER 600 MG PO TB12
600.0000 mg | ORAL_TABLET | Freq: Two times a day (BID) | ORAL | Status: DC
  Administered 2016-11-10 – 2016-11-11 (×2): 600 mg via ORAL
  Filled 2016-11-10 (×3): qty 1

## 2016-11-10 MED ORDER — RISPERIDONE 1 MG PO TABS
1.0000 mg | ORAL_TABLET | Freq: Every day | ORAL | Status: DC
  Administered 2016-11-10 – 2016-11-16 (×7): 1 mg via ORAL
  Filled 2016-11-10: qty 2
  Filled 2016-11-10: qty 1
  Filled 2016-11-10: qty 2
  Filled 2016-11-10 (×6): qty 1

## 2016-11-10 MED ORDER — PANTOPRAZOLE SODIUM 40 MG PO TBEC
40.0000 mg | DELAYED_RELEASE_TABLET | Freq: Every day | ORAL | Status: DC
  Administered 2016-11-10 – 2016-11-13 (×4): 40 mg via ORAL
  Filled 2016-11-10 (×4): qty 1

## 2016-11-10 MED ORDER — LEVOFLOXACIN IN D5W 750 MG/150ML IV SOLN: 750.0000 mg | Freq: Once | INTRAVENOUS | Status: DC

## 2016-11-10 MED ORDER — SODIUM CHLORIDE 0.9% FLUSH
3.0000 mL | Freq: Two times a day (BID) | INTRAVENOUS | Status: DC
  Administered 2016-11-10 – 2016-11-17 (×11): 3 mL via INTRAVENOUS

## 2016-11-10 MED ORDER — DEXTROSE 5 % IV SOLN: 2.0000 g | Freq: Once | INTRAVENOUS | Status: DC

## 2016-11-10 NOTE — Progress Notes (Signed)
Pharmacy Antibiotic Note  Stacie Wright is a 83 y.o. female admitted on 11/10/2016 with sepsis.  Pharmacy has been consulted for Vancomycin and Cefepime dosing. CrCl calculated to be ~26 ml/min.  WBC 19.2. Febrile Tmax 100.6. Penicillin allergy noted but patient has tolerated cephalosporins many times in the past.    Plan: Vancomycin 1500 mg IV load x 1 Vancomycin 750 mg  IV every 24 hours.  Goal trough 15-20 mcg/mL.  Cefepime 1 gram every 24 hours.  Monitor temp, WBCs, cultures, renal function Vancomycin trough at SS    Temp (24hrs), Avg:100.6 F (38.1 C), Min:100.6 F (38.1 C), Max:100.6 F (38.1 C)   Recent Labs Lab 11/10/16 0846 11/10/16 0859  WBC 19.2*  --   CREATININE 1.61*  --   LATICACIDVEN  --  5.53*      Allergies  Allergen Reactions  . Amoxicillin Anaphylaxis and Rash  . Darvocet [Propoxyphene N-Acetaminophen] Other (See Comments)    hallucinations  . Penicillins Anaphylaxis and Rash    Has patient had a PCN reaction causing immediate rash, facial/tongue/throat swelling, SOB or lightheadedness with hypotension: Yes Has patient had a PCN reaction causing severe rash involving mucus membranes or skin necrosis: No Has patient had a PCN reaction that required hospitalization No Has patient had a PCN reaction occurring within the last 10 years: No If all of the above answers are "NO", then may proceed with Cephalosporin use.   . Sulfa Antibiotics Anaphylaxis and Rash  . Propoxyphene Other (See Comments)    hallucinations     Thank you for allowing pharmacy to be a part of this patient's care.  Dwain SarnaMeredith Wilma Michaelson 11/10/2016 9:31 AM

## 2016-11-10 NOTE — H&P (Signed)
[facility] Admission History and Physical Service Pager: [phone]  Patient name: Stacie Wright Medical record number: [id number] Date of birth: 05-15-1928 Age: 83 y.o. Gender: female  Primary Care Provider: Wende Neighbors, MD Consultants: CCM Code Status: DO NOT RESUSCITATE  Chief Complaint: Respiratory failure  Assessment and Plan: Stacie Wright is a 83 y.o. female presenting with respiratory failure. PMH is significant for COPD, MAC, bronchiectasis, DVT x 2, and dementia.   Acute hypoxic respiratory failure with severe sepsis: History of bronchiectasis, COPD and recently finished prolonged abx course for MAC infection. Reportedly sating in the 70s at her memory unit and required BiPAP enroute and has diffuse rhonchi worse L>R and satting 100% with bipap. Patient is very alert and does not require ABG at this time. Lactic acid to 5.5 upon presentation with leukocytosis to 19.2 and BP to 72/53 with improvement to 84/57 after 3L boluses meeting severe sepsis. CXR showing increasing density in the right upper lobe. Resp failure likely 2/2 PNA given history of Alzheimers with dementia and significant risk for aspiration PNA or HCAP due to her living in memory unit. Patient has DNR/DNI status. Discussed plan with her son and POA, Stacie Wright, who would like [place] to proceed with plan for continued bipap.  - Admit to SDU with telemetry under attending Garner per floor protocol - Wean to non-rebreather as appropriate - Treating broadly for HCAP s/p aztreonam now on vanc and cefepime - ABG if acute change in mental status - Continuous pulse ox - Narrow abx as patient improves - tessalon perles, guaifenesin  - Swallow eval - Obtain 2V CXR in am  COPD: Taking brovana daily.  - continue brovana - 75m prednisone starting 2/27 AM - consider scheduled duonebs - s/p solumedrol in ED  History of DVT: Multiple DVTs. Taking eliquis  - continue eliquis 2.5 mg  BID  Hypertension: Takes lopressor 263mdaily - holding home regimen for hypotension in setting of sepsis  GAD: Takes paxil 2066maily and xanax 0.5mg42mhrs PRN anxiety.  - Will reduce xanax dose to 0.25 mg daily in setting of tenuous respiratory status  Alzheimers dementia: Resides at memory unit. Taking resperidol 1mg 24mly and paxil 20mg 10my and xanax 0.5mg q613m PRN anxiety. - continue home regimen but with reduced dose of xanax  FEN/GI: s/p 3L boluses/maintenance IV fluids/protonix Prophylaxis: eliquis  Disposition: Admit to SDU with telemetry under attending Hensel  History of Present Illness:  Stacie MEUSER8 y.o.77emale presenting with respiratory failure. Patient resides at a memory unit and was reportedly sating down into the 70s on room air. [facility] and required BiPAP. She states that she has had 2 days of cough and increased WOB. Denies increased sputum. History limited due to patient's dementia. She denies any nausea, vomiting, dysuria, CP, fevers or chills.   Review Of Systems: Per HPI   ROS Level 5 caveat for dementia  Patient Active Problem List   Diagnosis Date Noted  . Obstructive bronchiectasis (HCC) 02[place]/2017  . Bronchiectasis (HCC) 01[place]/2017  . Acute respiratory failure with hypoxia (HCC) 01[place]/2017  . Acute exacerbation of bronchiectasis (HCC) 01[place]/2017  . Mold exposure 06/20/2015  . Generalized anxiety disorder 01/19/2015  . Depression 01/19/2015  . Dementia in Alzheimer's disease 01/19/2015  . Mycobacterium avium-intracellulare infection (HCC) 05[place]/2016  . Pneumonia 01/15/2015  . Cellulitis 06/09/2013  . Infected hardware in left leg (HCC) 09[place]/2014  . Constipation 04/27/2013  .  Pneumonia, organism unspecified(486) 04/26/2013  . COPD exacerbation ([place]) 04/26/2013  . Bronchiectasis without acute exacerbation ([place]) 04/26/2013  . Pulmonary nodule 04/26/2013  . Acute bronchitis 04/25/2013  .  Abnormal chest x-ray 04/25/2013  . Generalized weakness 04/25/2013  . Hypoxia 04/25/2013  . Hypertension     Past Medical History: Past Medical History:  Diagnosis Date  . Alzheimer's dementia   . Anxiety   . Arthritis   . Bronchiectasis ([place]) 2014  . COPD (chronic obstructive pulmonary disease) ([place])   . DVT (deep venous thrombosis) ([place]) 2008   LLE  . Gout   . Headache(784.0)   . Hypertension   . IBS (irritable bowel syndrome)   . MAI (mycobacterium avium-intracellulare) infection ([place]) 2014  . Mold exposure 06/20/2015  . Pneumonia 04/2013  . Shortness of breath     Past Surgical History: Past Surgical History:  Procedure Laterality Date  . ABDOMINAL HYSTERECTOMY    . CATARACT EXTRACTION    . CHOLECYSTECTOMY    . FRACTURE SURGERY      Social History: Social History  Substance Use Topics  . Smoking status: Never Smoker  . Smokeless tobacco: Never Used  . Alcohol use No    Family History: Family History  Problem Relation Age of Onset  . Diabetes Father   . Hypertension Father   . Rheum arthritis Father   . Lung cancer Sister     smoked    Allergies and Medications: Allergies  Allergen Reactions  . Amoxicillin Anaphylaxis and Rash  . Darvocet [Propoxyphene N-Acetaminophen] Other (See Comments)    hallucinations  . Penicillins Anaphylaxis and Rash    Has patient had a PCN reaction causing immediate rash, facial/tongue/throat swelling, SOB or lightheadedness with hypotension: Yes Has patient had a PCN reaction causing severe rash involving mucus membranes or skin necrosis: No Has patient had a PCN reaction that required hospitalization No Has patient had a PCN reaction occurring within the last 10 years: No If all of the above answers are "NO", then may proceed with Cephalosporin use.   . Sulfa Antibiotics Anaphylaxis and Rash  . Propoxyphene Other (See Comments)    hallucinations   No current facility-administered medications on file prior to  encounter.    Current Outpatient Prescriptions on File Prior to Encounter  Medication Sig Dispense Refill  . allopurinol (ZYLOPRIM) 300 MG tablet Take 300 mg by mouth daily.    Marland Kitchen ALPRAZolam (XANAX) 0.5 MG tablet Take 0.5 mg by mouth every 6 (six) hours as needed for anxiety.    Marland Kitchen apixaban (ELIQUIS) 5 MG TABS tablet Take 2 tablets (10 mg total) by mouth 2 (two) times daily. (Patient taking differently: Take 2.5 mg by mouth 2 (two) times daily. ) 14 tablet 0  . arformoterol (BROVANA) 15 MCG/2ML NEBU Take 15 mcg by nebulization daily.    Marland Kitchen azithromycin (ZITHROMAX) 500 MG tablet Take 1 tablet (500 mg total) by mouth 3 (three) times a week. (Patient taking differently: Take 500 mg by mouth every Monday, Wednesday, and Friday. ) 12 tablet 10  . dorzolamide (TRUSOPT) 2 % ophthalmic solution Place 1 drop into both eyes 2 (two) times daily.    Marland Kitchen ethambutol (MYAMBUTOL) 400 MG tablet Take 4 tablets (1,600 mg total) by mouth 3 (three) times a week. (Patient taking differently: Take 1,600 mg by mouth every Monday, Wednesday, and Friday. ) 48 tablet 11  . latanoprost (XALATAN) 0.005 % ophthalmic solution Place 1 drop into both eyes at bedtime.    . metoprolol tartrate (  LOPRESSOR) 25 MG tablet Take 25 mg by mouth daily.    Marland Kitchen PARoxetine (PAXIL) 20 MG tablet Take 20 mg by mouth daily.    . rifampin (RIFADIN) 300 MG capsule Take 2 capsules (600 mg total) by mouth 3 (three) times a week. (Patient taking differently: Take 600 mg by mouth every Monday, Wednesday, and Friday. ) 30 capsule 11  . risperiDONE (RISPERDAL) 1 MG tablet Take 1 mg by mouth at bedtime.    . triamterene-hydrochlorothiazide (MAXZIDE-25) 37.5-25 MG per tablet Take 1 tablet by mouth daily.       Objective: BP (!) 84/57   Pulse 86   Temp 100.6 F (38.1 C)   Resp 26   SpO2 100%  Exam: General: elderly female sitting up in bed in mod distress on bipap  Eyes: EOMI, PERRL, non-injected, anicteric ENTM: no nasal drainage, clear oropharynx, poor  dentition and tachy mucous membranes Neck: supple, no LAD Cardiovascular: RRR, no MRG, 2+ palpable pulses, no edema Respiratory: satting 100% on bipap, with diffuse rhonchi throughout L>R, no wheezing or crackles apparent Gastrointestinal: soft, non-tender, non-distended, no palpable masses MSK: no gross deformities or edema, FROM Derm: warm and dry, no new rashes Neuro: Alert to herself   Labs and Imaging: CBC BMET   Recent Labs Lab 11/10/16 0846  WBC 19.2*  HGB 12.5  HCT 38.4  PLT 146*    Recent Labs Lab 11/10/16 0846  NA 136  K 5.6*  CL 101  CO2 22  BUN 29*  CREATININE 1.61*  GLUCOSE 159*  CALCIUM 8.7*     Dg Chest Portable 1 View  Result Date: 11/10/2016 CLINICAL DATA:  Shortness of breath, weakness EXAM: PORTABLE CHEST 1 VIEW COMPARISON:  10/06/2016 FINDINGS: Patchy airspace disease noted in the right upper lobe and both lung bases. This is similar to prior study and prior CT in the lung bases, likely related to bronchiectasis seen on prior CT. This is increased slightly in the right upper lobe. Heart is normal size. No effusions or acute bony abnormality. IMPRESSION: Bronchiectatic changes in the lung bases. Increasing density in the right upper lobe. Cannot exclude superimposed pneumonia. Electronically Signed   By: Rolm Baptise M.D.   On: 11/10/2016 09:11    Rosana Berger, MD 11/10/2016, 1:10 PM PGY-1, [place] Intern pager: [phone], text pages welcome  Upper Level Addendum:  I have seen and evaluated this patient along with Dr. Rosalyn Gess and reviewed the above note, making necessary revisions in pink.  Rogue Bussing, MD PGY-2,  [facility] 11/10/2016 7:16 PM

## 2016-11-10 NOTE — ED Triage Notes (Signed)
Per EM- pt is from nursing facility. Pt was reported to have increasing cough for several days. Pt was found by EMS to have room air spo2 of 71%. Pt was given duoneb and placed on CPAP. spo2 increased to 90%. Pt received 125mg  solumedrol and 2g of mag. Pt was reported to have rales/rhonchi/wheezing.

## 2016-11-10 NOTE — ED Notes (Signed)
CCM at bedside.  Pt is now a full DNR.  Stated to slow fluid to 100/hr.

## 2016-11-10 NOTE — ED Notes (Signed)
Shown sofla pa.pt;s I stat tropi.0.32

## 2016-11-10 NOTE — ED Provider Notes (Signed)
Emergency Department Provider Note   I have reviewed the triage vital signs and the nursing notes.   HISTORY  Chief Complaint Respiratory Distress   HPI Stacie Wright is a 83 y.o. female with PMH of dementia, COPD, DVT on Eliquis, and HTN presents to the emergency department by EMS for evaluation of respiratory distress and hypoxemia. Patient has had 2 days of cough per EMS. When the medics arrived on scene the patient had an oxygen saturation of 71% on room air and had increased work of breathing. Transition the patient to CPAP and gave a DuoNeb along with Solu-Medrol and magnesium. The patient denies any chest pain but the history and review of systems are limited by her underlying dementia. EMS report that she seems to be improving clinically en route. No report of fever or AMS per facility.   Level 5 caveat: Dementia   Past Medical History:  Diagnosis Date  . Alzheimer's dementia   . Anxiety   . Arthritis   . Bronchiectasis (HCC) 2014  . COPD (chronic obstructive pulmonary disease) (HCC)   . DVT (deep venous thrombosis) (HCC) 2008   LLE  . Gout   . Headache(784.0)   . Hypertension   . IBS (irritable bowel syndrome)   . MAI (mycobacterium avium-intracellulare) infection (HCC) 2014  . Mold exposure 06/20/2015  . Pneumonia 04/2013  . Shortness of breath     Patient Active Problem List   Diagnosis Date Noted  . Respiratory failure (HCC) 11/10/2016  . HCAP (healthcare-associated pneumonia)   . Lactic acidosis   . Sepsis (HCC)   . Obstructive bronchiectasis (HCC) 11/01/2015  . Bronchiectasis (HCC) 10/11/2015  . Acute respiratory failure with hypoxia (HCC) 10/11/2015  . Acute exacerbation of bronchiectasis (HCC) 10/11/2015  . Mold exposure 06/20/2015  . Generalized anxiety disorder 01/19/2015  . Depression 01/19/2015  . Dementia in Alzheimer's disease 01/19/2015  . Mycobacterium avium-intracellulare infection (HCC) 01/18/2015  . Pneumonia 01/15/2015  . Cellulitis  06/09/2013  . Infected hardware in left leg (HCC) 06/09/2013  . Constipation 04/27/2013  . Pneumonia, organism unspecified(486) 04/26/2013  . COPD exacerbation (HCC) 04/26/2013  . Bronchiectasis without acute exacerbation (HCC) 04/26/2013  . Pulmonary nodule 04/26/2013  . Acute bronchitis 04/25/2013  . Abnormal chest x-ray 04/25/2013  . Generalized weakness 04/25/2013  . Hypoxia 04/25/2013  . Hypertension     Past Surgical History:  Procedure Laterality Date  . ABDOMINAL HYSTERECTOMY    . CATARACT EXTRACTION    . CHOLECYSTECTOMY    . FRACTURE SURGERY        Allergies Amoxicillin; Darvocet [propoxyphene n-acetaminophen]; Penicillins; Sulfa antibiotics; and Propoxyphene  Family History  Problem Relation Age of Onset  . Diabetes Father   . Hypertension Father   . Rheum arthritis Father   . Lung cancer Sister     smoked    Social History Social History  Substance Use Topics  . Smoking status: Never Smoker  . Smokeless tobacco: Never Used  . Alcohol use No    Review of Systems  Level 5 caveat: dementia.   ____________________________________________   PHYSICAL EXAM:  VITAL SIGNS: Temp: 100.6 F SpO2: 100% Pulse: 99 BP: 76/58 RR: 30  Constitutional: Alert with baseline confusion. Patient with some moderate respiratory distress.  Eyes: Conjunctivae are normal.  Head: Atraumatic. Nose: No congestion/rhinnorhea. Mouth/Throat: Mucous membranes are moist.  Neck: No stridor.  Cardiovascular: Normal rate, regular rhythm. Good peripheral circulation. Grossly normal heart sounds.   Respiratory: Normal respiratory effort.  No retractions. Lungs with  rhonchi at the bilateral bases and faint end-expiratory wheezing.  Gastrointestinal: Soft and nontender. No distention.  Musculoskeletal: No lower extremity tenderness nor edema. No gross deformities of extremities. Neurologic:  No gross focal neurologic deficits are appreciated.  Skin:  Skin is warm, dry and intact.  No rash noted.   ____________________________________________   LABS (all labs ordered are listed, but only abnormal results are displayed)  Labs Reviewed  COMPREHENSIVE METABOLIC PANEL - Abnormal; Notable for the following:       Result Value   Potassium 5.6 (*)    Glucose, Bld 159 (*)    BUN 29 (*)    Creatinine, Ser 1.61 (*)    Calcium 8.7 (*)    Total Protein 6.3 (*)    AST 45 (*)    ALT 10 (*)    Alkaline Phosphatase 33 (*)    Total Bilirubin 1.6 (*)    GFR calc non Af Amer 28 (*)    GFR calc Af Amer 32 (*)    All other components within normal limits  BRAIN NATRIURETIC PEPTIDE - Abnormal; Notable for the following:    B Natriuretic Peptide 299.1 (*)    All other components within normal limits  CBC WITH DIFFERENTIAL/PLATELET - Abnormal; Notable for the following:    WBC 19.2 (*)    RBC 3.82 (*)    MCV 100.5 (*)    Platelets 146 (*)    Neutro Abs 17.3 (*)    Monocytes Absolute 1.1 (*)    All other components within normal limits  INFLUENZA PANEL BY PCR (TYPE A & B) - Abnormal; Notable for the following:    Influenza A By PCR POSITIVE (*)    All other components within normal limits  URINALYSIS, ROUTINE W REFLEX MICROSCOPIC - Abnormal; Notable for the following:    Glucose, UA 50 (*)    Leukocytes, UA TRACE (*)    Squamous Epithelial / LPF 0-5 (*)    All other components within normal limits  LACTIC ACID, PLASMA - Abnormal; Notable for the following:    Lactic Acid, Venous 2.9 (*)    All other components within normal limits  BASIC METABOLIC PANEL - Abnormal; Notable for the following:    Glucose, Bld 185 (*)    BUN 29 (*)    Creatinine, Ser 1.40 (*)    Calcium 8.0 (*)    GFR calc non Af Amer 33 (*)    GFR calc Af Amer 38 (*)    All other components within normal limits  TROPONIN I - Abnormal; Notable for the following:    Troponin I 0.33 (*)    All other components within normal limits  I-STAT TROPOININ, ED - Abnormal; Notable for the following:     Troponin i, poc 0.32 (*)    All other components within normal limits  I-STAT CG4 LACTIC ACID, ED - Abnormal; Notable for the following:    Lactic Acid, Venous 5.53 (*)    All other components within normal limits  I-STAT VENOUS BLOOD GAS, ED - Abnormal; Notable for the following:    pH, Ven 7.212 (*)    pCO2, Ven 64.6 (*)    pO2, Ven 24.0 (*)    Acid-base deficit 3.0 (*)    All other components within normal limits  CULTURE, BLOOD (ROUTINE X 2)  CULTURE, BLOOD (ROUTINE X 2)  MRSA PCR SCREENING  LIPASE, BLOOD  TROPONIN I  TROPONIN I  COMPREHENSIVE METABOLIC PANEL  CBC   ____________________________________________  EKG  EKG Interpretation  Date/Time:  Monday November 10 2016 08:42:14 EST Ventricular Rate:  98 PR Interval:    QRS Duration: 109 QT Interval:  339 QTC Calculation: 433 R Axis:   87 Text Interpretation:  Sinus rhythm Consider right ventricular hypertrophy No STEMI.  Confirmed by Lachae Hohler MD, Mauriana Dann [phone]) on 11/10/2016 8:45:48 AM       ____________________________________________  RADIOLOGY  Dg Chest Portable 1 View  Result Date: 11/10/2016 CLINICAL DATA:  Shortness of breath, weakness EXAM: PORTABLE CHEST 1 VIEW COMPARISON:  10/06/2016 FINDINGS: Patchy airspace disease noted in the right upper lobe and both lung bases. This is similar to prior study and prior CT in the lung bases, likely related to bronchiectasis seen on prior CT. This is increased slightly in the right upper lobe. Heart is normal size. No effusions or acute bony abnormality. IMPRESSION: Bronchiectatic changes in the lung bases. Increasing density in the right upper lobe. Cannot exclude superimposed pneumonia. Electronically Signed   By: Charlett Nose M.D.   On: 11/10/2016 09:11    ____________________________________________   PROCEDURES  Procedure(s) performed:   Procedures  CRITICAL CARE Performed by: Maia Plan Total critical care time: 60 minutes Critical care time was  exclusive of separately billable procedures and treating other patients. Critical care was necessary to treat or prevent imminent or life-threatening deterioration. Critical care was time spent personally by me on the following activities: development of treatment plan with patient and/or surrogate as well as nursing, discussions with consultants, evaluation of patient's response to treatment, examination of patient, obtaining history from patient or surrogate, ordering and performing treatments and interventions, ordering and review of laboratory studies, ordering and review of radiographic studies, pulse oximetry and re-evaluation of patient's condition.  Alona Bene, MD Emergency Medicine  ____________________________________________   INITIAL IMPRESSION / ASSESSMENT AND PLAN / ED COURSE  Pertinent labs & imaging results that were available during my care of the patient were reviewed by me and considered in my medical decision making (see chart for details).  Patient resents to the emergency room in for evaluation of her straight distress and hypoxemia on scene. She has a history of COPD. She was given DuoNeb, slight Medrol, magnesium in route with EMS. Patient appears awake and alert but has some baseline confusion from underlying dementia. Transitioned to BiPAP on arrival. Plan for portable chest x-ray and labs including troponin, VBG, and flu testing. No evidence of acute volume overload at this time.   09:10 AM Activated CODE SEPSIS with elevated lactate, leukocytosis, and hypotension. Ordered additional fluid bolus to make 30 ml/kg and started empiric abx. Patient has a listed Amoxicillin allergy. Despite hypotension the patient is awake and alert. She is answering questions and is in no acute respiratory distress.   09:45 PM BP up-trending slightly. Patient remains awake and alert with no acute distress. Appears well-perfused.   Patient seen by ICU who confirmed DNR status. Patient is  well-appearing clinically and appears to be improving with IVF. Discussed case with Family Medicine team.  ____________________________________________  FINAL CLINICAL IMPRESSION(S) / ED DIAGNOSES  Final diagnoses:  Acute respiratory failure with hypoxia (HCC)  Respiratory failure (HCC)     MEDICATIONS GIVEN DURING THIS VISIT:  Medications  vancomycin (VANCOCIN) IVPB 750 mg/150 ml premix (not administered)  ceFEPIme (MAXIPIME) 1 g in dextrose 5 % 50 mL IVPB (0 g Intravenous Stopped 11/10/16 1127)  PARoxetine (PAXIL) tablet 20 mg (20 mg Oral Not Given 11/10/16 1753)  risperiDONE (RISPERDAL) tablet 1 mg (not administered)  arformoterol (BROVANA) nebulizer solution 15 mcg (15 mcg Nebulization Not Given 11/10/16 1828)  allopurinol (ZYLOPRIM) tablet 300 mg (300 mg Oral Not Given 11/10/16 1752)  dorzolamide (TRUSOPT) 2 % ophthalmic solution 1 drop (not administered)  latanoprost (XALATAN) 0.005 % ophthalmic solution 1 drop (not administered)  ALPRAZolam (XANAX) tablet 0.25 mg (not administered)  sodium chloride flush (NS) 0.9 % injection 3 mL (not administered)  acetaminophen (TYLENOL) tablet 650 mg (not administered)    Or  acetaminophen (TYLENOL) suppository 650 mg (not administered)  0.9 %  sodium chloride infusion ( Intravenous New Bag/Given 11/10/16 1755)  guaiFENesin (MUCINEX) 12 hr tablet 600 mg (not administered)  benzonatate (TESSALON) capsule 100 mg (100 mg Oral Not Given 11/10/16 1753)  apixaban (ELIQUIS) tablet 2.5 mg (not administered)  pantoprazole (PROTONIX) EC tablet 40 mg (not administered)  sodium chloride 0.9 % bolus 500 mL (0 mLs Intravenous Stopped 11/10/16 1004)  sodium chloride 0.9 % bolus 500 mL (0 mLs Intravenous Stopped 11/10/16 1121)    And  sodium chloride 0.9 % bolus 1,000 mL (0 mLs Intravenous Stopped 11/10/16 1122)  vancomycin (VANCOCIN) 1,500 mg in sodium chloride 0.9 % 500 mL IVPB (0 mg Intravenous Stopped 11/10/16 1235)  sodium chloride 0.9 % bolus 1,000 mL  (1,000 mLs Intravenous Rate/Dose Change 11/10/16 1312)     NEW OUTPATIENT MEDICATIONS STARTED DURING THIS VISIT:  None   Note:  This document was prepared using Dragon voice recognition software and may include unintentional dictation errors.  Alona Bene, MD Emergency Medicine   Maia Plan, MD 11/10/16 [phone]

## 2016-11-10 NOTE — ED Notes (Signed)
Shown to sofla pa pt's labs resault cg4 lactic acid 5.53

## 2016-11-10 NOTE — Consult Note (Signed)
Name: Scotty CourtDorothy H Burgin MRN: [id number] DOB: [birth year]    ADMISSION DATE:  11/10/2016 CONSULTATION DATE:  11/10/2016  REFERRING MD :  EDP - Long  CHIEF COMPLAINT:  Respiratory failure and septic shock  BRIEF PATIENT DESCRIPTION: 83 year old female with PMH of dementia and bronchiectasis with COPD and concern for MAI who presents from SNF with SOB and hypotension.  Patient had 2 days of cough and increase WOB.  Found to have a sat of 71% on RA on presentation.  Patient is on BiPAP and has severe dementia and unable to provide further history.  Son is HCPOA.  SIGNIFICANT EVENTS  2/26 hospital admission  STUDIES:  CXR that I reviewed myself with RUL in filtrate and diffuse bronchiectasis.  HISTORY OF PRESENT ILLNESS:  83 year old female with PMH of dementia and bronchiectasis with COPD and concern for MAI who presents from SNF with SOB and hypotension.  Patient had 2 days of cough and increase WOB.  Found to have a sat of 71% on RA on presentation.  Patient is on BiPAP and has severe dementia and unable to provide further history.  Son is HCPOA.  PAST MEDICAL HISTORY :   has a past medical history of Alzheimer's dementia; Anxiety; Arthritis; Bronchiectasis (HCC) (2014); COPD (chronic obstructive pulmonary disease) (HCC); DVT (deep venous thrombosis) (HCC) (2008); Gout; Headache(784.0); Hypertension; IBS (irritable bowel syndrome); MAI (mycobacterium avium-intracellulare) infection (HCC) (2014); Mold exposure (06/20/2015); Pneumonia (04/2013); and Shortness of breath.  has a past surgical history that includes Abdominal hysterectomy; Cholecystectomy; Fracture surgery; and Cataract extraction. Prior to Admission medications   Medication Sig Start Date End Date Taking? Authorizing Provider  allopurinol (ZYLOPRIM) 300 MG tablet Take 300 mg by mouth daily.    Historical Provider, MD  ALPRAZolam Prudy Feeler(XANAX) 0.5 MG tablet Take 0.5 mg by mouth every 6 (six) hours as needed for anxiety.    Historical  Provider, MD  apixaban (ELIQUIS) 5 MG TABS tablet Take 2 tablets (10 mg total) by mouth 2 (two) times daily. Patient taking differently: Take 2.5 mg by mouth 2 (two) times daily.  01/11/16   Jacalyn LefevreJulie Haviland, MD  arformoterol (BROVANA) 15 MCG/2ML NEBU Take 15 mcg by nebulization daily.    Historical Provider, MD  azithromycin (ZITHROMAX) 500 MG tablet Take 1 tablet (500 mg total) by mouth 3 (three) times a week. Patient taking differently: Take 500 mg by mouth every Monday, Wednesday, and Friday.  11/06/15   Randall Hissornelius N Van Dam, MD  dorzolamide (TRUSOPT) 2 % ophthalmic solution Place 1 drop into both eyes 2 (two) times daily.    Historical Provider, MD  ethambutol (MYAMBUTOL) 400 MG tablet Take 4 tablets (1,600 mg total) by mouth 3 (three) times a week. Patient taking differently: Take 1,600 mg by mouth every Monday, Wednesday, and Friday.  10/03/15   Randall Hissornelius N Van Dam, MD  latanoprost (XALATAN) 0.005 % ophthalmic solution Place 1 drop into both eyes at bedtime.    Historical Provider, MD  metoprolol tartrate (LOPRESSOR) 25 MG tablet Take 25 mg by mouth daily.    Historical Provider, MD  PARoxetine (PAXIL) 20 MG tablet Take 20 mg by mouth daily.    Historical Provider, MD  rifampin (RIFADIN) 300 MG capsule Take 2 capsules (600 mg total) by mouth 3 (three) times a week. Patient taking differently: Take 600 mg by mouth every Monday, Wednesday, and Friday.  10/03/15   Randall Hissornelius N Van Dam, MD  risperiDONE (RISPERDAL) 1 MG tablet Take 1 mg by mouth at  bedtime.    Historical Provider, MD  triamterene-hydrochlorothiazide (MAXZIDE-25) 37.5-25 MG per tablet Take 1 tablet by mouth daily.     Historical Provider, MD   Allergies  Allergen Reactions  . Amoxicillin Anaphylaxis and Rash  . Darvocet [Propoxyphene N-Acetaminophen] Other (See Comments)    hallucinations  . Penicillins Anaphylaxis and Rash    Has patient had a PCN reaction causing immediate rash, facial/tongue/throat swelling, SOB or lightheadedness  with hypotension: Yes Has patient had a PCN reaction causing severe rash involving mucus membranes or skin necrosis: No Has patient had a PCN reaction that required hospitalization No Has patient had a PCN reaction occurring within the last 10 years: No If all of the above answers are "NO", then may proceed with Cephalosporin use.   . Sulfa Antibiotics Anaphylaxis and Rash  . Propoxyphene Other (See Comments)    hallucinations    FAMILY HISTORY:  family history includes Diabetes in her father; Hypertension in her father; Lung cancer in her sister; Rheum arthritis in her father. SOCIAL HISTORY:  reports that she has never smoked. She has never used smokeless tobacco. She reports that she does not drink alcohol or use drugs.  REVIEW OF SYSTEMS:   Unattainble as patient is on BiPAP and with advance dementia  SUBJECTIVE: Unattainable as above  VITAL SIGNS: Temp:  [100.6 F (38.1 C)] 100.6 F (38.1 C) (02/26 0910) Pulse Rate:  [84-99] 86 (02/26 1245) Resp:  [22-32] 26 (02/26 1245) BP: (72-93)/(38-69) 84/57 (02/26 1230) SpO2:  [100 %] 100 % (02/26 1245) FiO2 (%):  [80 %-100 %] 80 % (02/26 1105)  PHYSICAL EXAMINATION: General:  Chronically ill appearing female, moderate respiratory distress on BiPAP Neuro:  Awake and interactive but unable to speak coherently HEENT:  [place]/AT, PERRL, EOM-I and DMM Cardiovascular:  RRR, Nl S1/S2, -M/R/G. Lungs:  Coarse BS diffusely. Abdomen:  Soft, NT, ND and +BS. Musculoskeletal:  -edema and -tenderness Skin:  Intact   Recent Labs Lab 11/10/16 0846  NA 136  K 5.6*  CL 101  CO2 22  BUN 29*  CREATININE 1.61*  GLUCOSE 159*    Recent Labs Lab 11/10/16 0846  HGB 12.5  HCT 38.4  WBC 19.2*  PLT 146*   Dg Chest Portable 1 View  Result Date: 11/10/2016 CLINICAL DATA:  Shortness of breath, weakness EXAM: PORTABLE CHEST 1 VIEW COMPARISON:  10/06/2016 FINDINGS: Patchy airspace disease noted in the right upper lobe and both lung bases. This  is similar to prior study and prior CT in the lung bases, likely related to bronchiectasis seen on prior CT. This is increased slightly in the right upper lobe. Heart is normal size. No effusions or acute bony abnormality. IMPRESSION: Bronchiectatic changes in the lung bases. Increasing density in the right upper lobe. Cannot exclude superimposed pneumonia. Electronically Signed   By: Charlett Nose M.D.   On: 11/10/2016 09:11    ASSESSMENT / PLAN:  83 year old female with dementia presenting with RUL HCAP and likely an exacerbation of her bronchiectasis.    Acute respiratory failure:  - BiPAP  - DNI  HCAP:  - Vanc/zosyn  - Pan culture  - PCT protocol  Bronchiectasis exacerbation:  - Pan culture  - Anti-pseudomonal coverage (one agent ok).  MAI:   - Ignore for now.  Very unlikely to be an acute infection.  GOC: spoke with son Harvie Heck, patient has a living will that says full DNR.  Not a candidate for intubation or pressor support given advanced dementia and over  all health.  Will make a full DNR.  PCCM will sign off, please call back if needed.  The patient is critically ill with multiple organ systems failure and requires high complexity decision making for assessment and support, frequent evaluation and titration of therapies, application of advanced monitoring technologies and extensive interpretation of multiple databases.   Critical Care Time devoted to patient care services described in this note is  35  Minutes. This time reflects time of care of this signee Dr Koren Bound. This critical care time does not reflect procedure time, or teaching time or supervisory time of PA/NP/Med student/Med Resident etc but could involve care discussion time.  Alyson Reedy, M.D. [facility] Pulmonary/Critical Care Medicine. Pager: [phone]. After hours pager: [phone].  11/10/2016, 1:18 PM

## 2016-11-10 NOTE — ED Notes (Signed)
ED Provider at bedside. 

## 2016-11-10 NOTE — ED Notes (Signed)
Pt removed from bi-pap per admitting MD.  Tolerated well on 6L.  Reduced to 4L.  Will continue to assess.

## 2016-11-10 NOTE — ED Notes (Signed)
ED Provider at bedside. Aware of bp

## 2016-11-10 NOTE — ED Notes (Signed)
Shown toNIKKI S.RN PT.I STAT VENOUS BLOOD GAS  RESAULT PO2 

## 2016-11-10 NOTE — ED Notes (Signed)
Will continue to monitor pts BP. NS bolus running. Pt alert and verbal at this time.

## 2016-11-11 ENCOUNTER — Inpatient Hospital Stay (HOSPITAL_COMMUNITY): Payer: PPO

## 2016-11-11 DIAGNOSIS — J1100 Influenza due to unidentified influenza virus with unspecified type of pneumonia: Secondary | ICD-10-CM

## 2016-11-11 LAB — COMPREHENSIVE METABOLIC PANEL
ALK PHOS: 35 U/L — AB (ref 38–126)
ALT: 25 U/L (ref 14–54)
ANION GAP: 9 (ref 5–15)
AST: 49 U/L — ABNORMAL HIGH (ref 15–41)
Albumin: 3.1 g/dL — ABNORMAL LOW (ref 3.5–5.0)
BILIRUBIN TOTAL: 1 mg/dL (ref 0.3–1.2)
BUN: 25 mg/dL — ABNORMAL HIGH (ref 6–20)
CALCIUM: 8.5 mg/dL — AB (ref 8.9–10.3)
CO2: 25 mmol/L (ref 22–32)
Chloride: 106 mmol/L (ref 101–111)
Creatinine, Ser: 1.27 mg/dL — ABNORMAL HIGH (ref 0.44–1.00)
GFR, EST AFRICAN AMERICAN: 43 mL/min — AB (ref >60)
GFR, EST NON AFRICAN AMERICAN: 37 mL/min — AB (ref >60)
Glucose, Bld: 92 mg/dL (ref 65–99)
Potassium: 4.1 mmol/L (ref 3.5–5.1)
Sodium: 140 mmol/L (ref 135–145)
TOTAL PROTEIN: 6.1 g/dL — AB (ref 6.5–8.1)

## 2016-11-11 LAB — TROPONIN I: Troponin I: 0.3 ng/mL (ref <0.03)

## 2016-11-11 LAB — CBC
HCT: 35.4 % — ABNORMAL LOW (ref 36.0–46.0)
HEMOGLOBIN: 11.5 g/dL — AB (ref 12.0–15.0)
MCH: 32.9 pg (ref 26.0–34.0)
MCHC: 32.5 g/dL (ref 30.0–36.0)
MCV: 101.1 fL — ABNORMAL HIGH (ref 78.0–100.0)
Platelets: 127 10*3/uL — ABNORMAL LOW (ref 150–400)
RBC: 3.5 MIL/uL — AB (ref 3.87–5.11)
RDW: 14.3 % (ref 11.5–15.5)
WBC: 15.2 10*3/uL — ABNORMAL HIGH (ref 4.0–10.5)

## 2016-11-11 LAB — LACTIC ACID, PLASMA: LACTIC ACID, VENOUS: 2.3 mmol/L — AB (ref 0.5–1.9)

## 2016-11-11 MED ORDER — OSELTAMIVIR PHOSPHATE 30 MG PO CAPS
30.0000 mg | ORAL_CAPSULE | Freq: Two times a day (BID) | ORAL | Status: AC
  Administered 2016-11-11 – 2016-11-15 (×9): 30 mg via ORAL
  Filled 2016-11-11 (×11): qty 1

## 2016-11-11 MED ORDER — POLYETHYLENE GLYCOL 3350 17 G PO PACK
17.0000 g | PACK | Freq: Every day | ORAL | Status: DC
  Administered 2016-11-11 – 2016-11-17 (×7): 17 g via ORAL
  Filled 2016-11-11 (×7): qty 1

## 2016-11-11 MED ORDER — GUAIFENESIN 100 MG/5ML PO SOLN
10.0000 mL | ORAL | Status: DC | PRN
  Administered 2016-11-11 – 2016-11-16 (×5): 200 mg via ORAL
  Filled 2016-11-11 (×4): qty 10
  Filled 2016-11-11: qty 5
  Filled 2016-11-11: qty 50

## 2016-11-11 MED ORDER — OSELTAMIVIR PHOSPHATE 75 MG PO CAPS
75.0000 mg | ORAL_CAPSULE | Freq: Two times a day (BID) | ORAL | Status: DC
  Administered 2016-11-11: 75 mg via ORAL
  Filled 2016-11-11: qty 1

## 2016-11-11 NOTE — Progress Notes (Signed)
Family Medicine Teaching Service Daily Progress Note Intern Pager: [phone]  Patient name: Stacie Wright Medical record number: [id number] Date of birth: 1928/01/10 Age: 83 y.o. Gender: female  Primary Care Provider: Wende Neighbors, MD Consultants: CCM Code Status: DO NOT RESUSCITATE  Pt Overview and Major Events to Date:  2/26 Admit to FPTS  Assessment and Plan: Stacie Wright is a 83 y.o. female presenting with respiratory failure. PMH is significant for COPD, MAC, bronchiectasis, DVT x 2, and dementia.   Acute hypoxic respiratory failure with severe sepsis, improving:  COPD and recent completed abx course for MAC infection. Improving. S/p Bipap weaned to 4L by [place] overnight.  Low blood pressures improved with 3L boluses in ED, stable at 137/55 this AM.  CXR with increasing density in RUL. S/p aztreonam, now on vanc and cefepime (2/26 - ) for HCAP, though flu PNA also possible - repeat CXR - worsened RML/RLL density - Cont vanc and cefepime (2/26 - ) - Vitals per floor protocol - Wean O2  - ABG if acute change in mental status - Continuous pulse ox - Narrow abx as patient improves - tessalon perles, guaifenesin  - Swallow eval - Obtain 2V CXR in am - PT/OT consult  Influenza - Flu A positive - Started Tamiflu (2/27 - )  COPD: Taking brovana daily. S/p solumedrol in ED. Troponin 0.33>0.31>0.30 likely demand ischemia. - continue brovana - seems to be more of a PNA picture, will DC steroids - consider scheduled duonebs  History of DVT: Multiple DVTs. Continue eliquis 2.5 mg BID  AKI, Cr baseline ~0.88, elevated at 1.61 on admission, improving to 1.27 this AM.  Hypertension: Takes lopressor 98m daily - hold home medication w/presentation of hypotension  GAD: Takes paroxetine 252mdaily and xanax 0.89m40m6hrs PRN anxiety at home. - Will reduce xanax dose to 0.25 mg daily in setting of tenuous respiratory status - patient got extra PRN 0.25 o/n 2/26 for agitation - continue  paroxetine 20 mg Qd  Alzheimers dementia: Resides at memory unit. Taking resperidol 1mg64mily and paxil 20mg54mly and xanax 0.89mg q36ms PRN anxiety. - continue home regimen but with reduced dose of xanax  FEN/GI: s/p 3L boluses/maintenance IV fluids/protonix Prophylaxis: eliquis  Disposition: Consider transfer to med-surg  Subjective:  No acute events overnight. Febrile to 101.2. Oxygen was weaned from BiPAP down to 4 L by nasal cannula, she is satting well this morning. She denies nausea vomiting diarrhea or constipation. She feels her breathing has improved. She denies chest pain.  Objective: Temp:  [97.5 F (36.4 C)-101.2 F (38.4 C)] 100.1 F (37.8 C) (02/27 1205) Pulse Rate:  [78-107] 92 (02/27 1205) Resp:  [21-32] 32 (02/27 1205) BP: (88-138)/(48-94) 101/48 (02/27 1205) SpO2:  [91 %-100 %] 91 % (02/27 1205) Weight:  [70.3 kg (155 lb)] 70.3 kg (155 lb) (02/26 1700) Physical Exam: General: NAD, rests comfortably in bed, conversant, pleasant Cardiovascular: RRR, no murmurs rubs or gallops Respiratory: +diffuse rhonchi throughout all lung fields, worse in right upper and lung lower lungs, comfortable work of breathing, speaks in full sentences, good effort inspiration Abdomen: Soft and nontender, nondistended, no hepatosplenomegaly Extremities: Moves 4 extremities equally, no lower extremity edema  Laboratory:  Recent Labs Lab 11/10/16 0846 11/11/16 0338  WBC 19.2* 15.2*  HGB 12.5 11.5*  HCT 38.4 35.4*  PLT 146* 127*    Recent Labs Lab 11/10/16 0846 11/10/16 1108 11/11/16 0338  NA 136 138 140  K 5.6* 4.8 4.1  CL 101 106 106  CO2 _0 BUN 29* 29* 25*  CREATININE 1.61* 1.40* 1.27*  CALCIUM 8.7* 8.0* 8.5*  PROT 6.3*  --  6.1*  BILITOT 1.6*  --  1.0  ALKPHOS 33*  --  35*  ALT 10*  --  25  AST 45*  --  49*  GLUCOSE 159* 185* 92    Imaging/Diagnostic Tests: Dg Chest Port 1 View 11/11/2016 FINDINGS: Cardiomegaly with normal pulmonary vascularity. Right  upper lobe and bilateral lower lobe pulmonary infiltrates. Interim significant progression from prior exam. Small right pleural effusion. No pneumothorax.  IMPRESSION: Right upper lobe and bilateral lower lobe infiltrates noted consistent with pneumonia. Findings have progressed significantly from prior exam. Small right pleural effusion Electronically Signed   By: Marcello Moores  Register   On: 11/11/2016 07:35   Dg Chest Portable 1 View 11/10/2016 FINDINGS: Patchy airspace disease noted in the right upper lobe and both lung bases. This is similar to prior study and prior CT in the lung bases, likely related to bronchiectasis seen on prior CT. This is increased slightly in the right upper lobe. Heart is normal size. No effusions or acute bony abnormality.  IMPRESSION: Bronchiectatic changes in the lung bases. Increasing density in the right upper lobe. Cannot exclude superimposed pneumonia.     Everrett Coombe, MD 11/11/2016, 1:24 PM PGY-1, [place] Intern pager: [phone], text pages welcome

## 2016-11-11 NOTE — Evaluation (Signed)
Clinical/Bedside Swallow Evaluation Patient Details  Name: Stacie Wright Porta MRN: [id number] Date of Birth: [birth year]  Today's Date: 11/11/2016 Time: SLP Start Time (ACUTE ONLY): 78290904 SLP Stop Time (ACUTE ONLY): 0925 SLP Time Calculation (min) (ACUTE ONLY): 21 min  Past Medical History:  Past Medical History:  Diagnosis Date  . Alzheimer's dementia   . Anxiety   . Arthritis   . Bronchiectasis (HCC) 2014  . COPD (chronic obstructive pulmonary disease) (HCC)   . DVT (deep venous thrombosis) (HCC) 2008   LLE  . Gout   . Headache(784.0)   . Hypertension   . IBS (irritable bowel syndrome)   . MAI (mycobacterium avium-intracellulare) infection (HCC) 2014  . Mold exposure 06/20/2015  . Pneumonia 04/2013  . Shortness of breath    Past Surgical History:  Past Surgical History:  Procedure Laterality Date  . ABDOMINAL HYSTERECTOMY    . CATARACT EXTRACTION    . CHOLECYSTECTOMY    . FRACTURE SURGERY     HPI:  Pt is a 83 year old female with PMH of dementia and bronchiectasis with COPD and concern for MAI who presented from SNF with SOB and hypotension. Patient had 2 days of cough and increase WOB. Found to have a sat of 71% on RA on presentation. Patient is on BiPAP and has severe dementia and unable to provide further history. Son is HCPOA. CXR showed right upper lobe and bilateral lower lobe infiltrates noted consistent with pneumonia. Findings have progressed significantly from prior exam. Small right pleural effusion. Barium esophagram on 11/20/2014 showed diffuse age-related esophageal dysmotility. Patient unable to swallow a 12.5 mm barium tablet. Vallecular residuals. No prior ST notes.   Assessment / Plan / Recommendation Clinical Impression  Pleasant pt with mildly increased work of breathing and cough at baseline. Delayed coughing throughout assessment with ice chip and teaspoon water. Brisk swallow with puree/pudding however given current pna and mildly increased dyspnea recommend  instrumental study to fully assess oropharyngeal swallow. Recommend continue NPO except for crushed in applesauce with FEES vs MBS tomorrow (likely FEES).     SLP Visit Diagnosis: Dysphagia, unspecified (R13.10)    Aspiration Risk  Moderate aspiration risk    Diet Recommendation NPO except meds   Medication Administration: Crushed with puree    Other  Recommendations Oral Care Recommendations: Oral care QID   Follow up Recommendations  (TBD)      Frequency and Duration            Prognosis        Swallow Study   General HPI: Pt is a 83 year old female with PMH of dementia and bronchiectasis with COPD and concern for MAI who presented from SNF with SOB and hypotension. Patient had 2 days of cough and increase WOB. Found to have a sat of 71% on RA on presentation. Patient is on BiPAP and has severe dementia and unable to provide further history. Son is HCPOA. CXR showed right upper lobe and bilateral lower lobe infiltrates noted consistent with pneumonia. Findings have progressed significantly from prior exam. Small right pleural effusion. Barium esophagram on 11/20/2014 showed diffuse age-related esophageal dysmotility. Patient unable to swallow a 12.5 mm barium tablet. Vallecular residuals. No prior ST notes. Type of Study: Bedside Swallow Evaluation Previous Swallow Assessment: none Diet Prior to this Study: NPO Temperature Spikes Noted: Yes Respiratory Status: Nasal cannula History of Recent Intubation: No Behavior/Cognition: Alert;Cooperative;Pleasant mood;Requires cueing Oral Cavity Assessment: Dry Oral Care Completed by SLP: Yes Oral Cavity - Dentition: Adequate  natural dentition Vision: Functional for self-feeding Self-Feeding Abilities: Able to feed self Patient Positioning: Upright in bed Baseline Vocal Quality: Low vocal intensity Volitional Cough: Strong Volitional Swallow: Able to elicit    Oral/Motor/Sensory Function Overall Oral Motor/Sensory Function: Within  functional limits   Ice Chips Ice chips: Impaired Presentation: Spoon Pharyngeal Phase Impairments: Suspected delayed Swallow   Thin Liquid Thin Liquid: Impaired Presentation: Spoon;Cup Pharyngeal  Phase Impairments: Suspected delayed Swallow;Cough - Delayed    Nectar Thick Nectar Thick Liquid: Not tested   Honey Thick Honey Thick Liquid: Not tested   Puree Puree: Impaired Pharyngeal Phase Impairments: Suspected delayed Swallow   Solid   GO   Solid: Not tested        Royce Macadamia 11/11/2016,12:08 PM  Breck Coons Lonell Face.Ed [employer] [phone]

## 2016-11-11 NOTE — Progress Notes (Signed)
PHARMACY NOTE:  ANTIMICROBIAL RENAL DOSAGE ADJUSTMENT  Current antimicrobial regimen includes a mismatch between antimicrobial dosage and estimated renal function.  As per policy approved by the Pharmacy & Therapeutics and Medical Executive Committees, the antimicrobial dosage will be adjusted accordingly.  Current antimicrobial dosage:  tamiflu 75 mg BID Indication: Flu +  Renal Function:  Estimated Creatinine Clearance: 32.1 mL/min (by C-G formula based on SCr of 1.27 mg/dL (H)). []      On intermittent HD, scheduled: []      On CRRT    Antimicrobial dosage has been changed to:  tamiflu 30 mg BID   Thank you for allowing pharmacy to be a part of this patient's care.  Allena KatzCaroline E Welles, Brandywine HospitalRPH 11/11/2016 11:20 AM

## 2016-11-12 LAB — BASIC METABOLIC PANEL
Anion gap: 7 (ref 5–15)
BUN: 18 mg/dL (ref 6–20)
CHLORIDE: 106 mmol/L (ref 101–111)
CO2: 24 mmol/L (ref 22–32)
CREATININE: 0.91 mg/dL (ref 0.44–1.00)
Calcium: 7.9 mg/dL — ABNORMAL LOW (ref 8.9–10.3)
GFR calc Af Amer: >60 mL/min (ref >60)
GFR calc non Af Amer: 56 mL/min — ABNORMAL LOW (ref >60)
Glucose, Bld: 87 mg/dL (ref 65–99)
Potassium: 4 mmol/L (ref 3.5–5.1)
SODIUM: 137 mmol/L (ref 135–145)

## 2016-11-12 LAB — CBC
HCT: 32.2 % — ABNORMAL LOW (ref 36.0–46.0)
HEMOGLOBIN: 10.3 g/dL — AB (ref 12.0–15.0)
MCH: 32.3 pg (ref 26.0–34.0)
MCHC: 32 g/dL (ref 30.0–36.0)
MCV: 100.9 fL — ABNORMAL HIGH (ref 78.0–100.0)
Platelets: 106 10*3/uL — ABNORMAL LOW (ref 150–400)
RBC: 3.19 MIL/uL — AB (ref 3.87–5.11)
RDW: 14.2 % (ref 11.5–15.5)
WBC: 7.3 10*3/uL (ref 4.0–10.5)

## 2016-11-12 MED ORDER — ORAL CARE MOUTH RINSE
15.0000 mL | Freq: Two times a day (BID) | OROMUCOSAL | Status: DC
  Administered 2016-11-12 – 2016-11-17 (×9): 15 mL via OROMUCOSAL

## 2016-11-12 NOTE — Evaluation (Signed)
Occupational Therapy Evaluation Patient Details Name: Stacie Wright MRN: [id number] DOB: November 12, 1927 Today's Date: 11/12/2016    History of Present Illness Pt adm with respiratory failure, sepsis, and PNA. PMH - dementia, COPD, respiratory failure   Clinical Impression   Pt admitted with above. She demonstrates the below listed deficits and will benefit from continued OT to maximize safety and independence with BADLs.  Pt presents to OT with generalized weakness, decreased activity tolerance, impaired cognition, impaired balance.  She requires mod - total A for ADLs.   Anticipate she will require SNF level rehab at discharge, but if she progresses to a level that she could return to ALF, feel this would be best option to allow familiarity and consistency.       Follow Up Recommendations  SNF;Other (comment)    Equipment Recommendations  None recommended by OT    Recommendations for Other Services       Precautions / Restrictions Precautions Precautions: Fall Restrictions Weight Bearing Restrictions: No      Mobility Bed Mobility Overal bed mobility: Needs Assistance Bed Mobility: Sit to Supine     Supine to sit: Mod assist;+2 for safety/equipment Sit to supine: Min assist   General bed mobility comments: assist to lift LEs onto bed   Transfers Overall transfer level: Needs assistance Equipment used: Rolling walker (2 wheeled) Transfers: Sit to/from UGI CorporationStand;Stand Pivot Transfers Sit to Stand: Min assist Stand pivot transfers: Min assist       General transfer comment: assist to lift hips and for balance.  Step by step instruction     Balance Overall balance assessment: Needs assistance Sitting-balance support: Bilateral upper extremity supported;Feet supported Sitting balance-Leahy Scale: Fair Sitting balance - Comments: UE support   Standing balance support: Bilateral upper extremity supported Standing balance-Leahy Scale: Poor Standing balance comment:  walker and min A for static standing                            ADL Overall ADL's : Needs assistance/impaired Eating/Feeding: NPO   Grooming: Wash/dry hands;Oral care;Wash/dry face;Brushing hair;Moderate assistance;Sitting   Upper Body Bathing: Maximal assistance;Sitting   Lower Body Bathing: Maximal assistance;Sit to/from stand   Upper Body Dressing : Maximal assistance;Sitting   Lower Body Dressing: Maximal assistance;Sit to/from stand   Toilet Transfer: Minimal assistance;Stand-pivot;BSC   Toileting- Clothing Manipulation and Hygiene: Total assistance;Sit to/from stand       Functional mobility during ADLs: Minimal assistance General ADL Comments: Pt very distractable with difficulty attending to task at hand      Vision         Perception     Praxis      Pertinent Vitals/Pain Pain Assessment: Faces Faces Pain Scale: No hurt     Hand Dominance Right   Extremity/Trunk Assessment Upper Extremity Assessment Upper Extremity Assessment: Generalized weakness   Lower Extremity Assessment Lower Extremity Assessment: Defer to PT evaluation   Cervical / Trunk Assessment Cervical / Trunk Assessment: Kyphotic   Communication Communication Communication: HOH   Cognition Arousal/Alertness: Awake/alert Behavior During Therapy: Anxious Overall Cognitive Status: No family/caregiver present to determine baseline cognitive functioning                 General Comments: Pt with h/o dementia    General Comments       Exercises       Shoulder Instructions      Home Living Family/patient expects to be discharged to:: Assisted living  Additional Comments: [facility] resides [facility]       Prior Functioning/Environment          Comments: pt unable to provide info         OT Problem List: Decreased strength;Decreased activity tolerance;Impaired balance (sitting and/or standing);Decreased  cognition;Decreased safety awareness;Decreased knowledge of use of DME or AE;Pain;Cardiopulmonary status limiting activity      OT Treatment/Interventions: Self-care/ADL training;Therapeutic exercise;Energy conservation;DME and/or AE instruction;Therapeutic activities;Cognitive remediation/compensation;Patient/family education;Balance training    OT Goals(Current goals can be found in the care plan section) Acute Rehab OT Goals Patient Stated Goal: unable to state OT Goal Formulation: Patient unable to participate in goal setting Time For Goal Achievement: 11/26/16 Potential to Achieve Goals: Fair ADL Goals Pt Will Perform Grooming: with min guard assist;standing Pt Will Perform Upper Body Bathing: with min assist;sitting Pt Will Perform Lower Body Bathing: with mod assist;sit to/from stand Pt Will Transfer to Toilet: with min guard assist;ambulating;bedside commode;grab bars;regular height toilet Pt Will Perform Toileting - Clothing Manipulation and hygiene: with min assist;sit to/from stand  OT Frequency: Min 2X/week   Barriers to D/C: Decreased caregiver support          Co-evaluation              End of Session Equipment Utilized During Treatment: Oxygen Nurse Communication: Mobility status  Activity Tolerance: Patient limited by fatigue Patient left: in bed;with call bell/phone within reach;with bed alarm set  OT Visit Diagnosis: Unsteadiness on feet (R26.81)                ADL either performed or assessed with clinical judgement  Time: 1147-1202 OT Time Calculation (min): 15 min Charges:  OT General Charges $OT Visit: 1 Procedure OT Evaluation $OT Eval Moderate Complexity: 1 Procedure G-Codes:     Stacie Wright, OTR/L [phone]   Stacie Wright 11/12/2016, 3:58 PM

## 2016-11-12 NOTE — Evaluation (Signed)
Physical Therapy Evaluation Patient Details Name: Stacie Wright MRN: [id number] DOB: 1928-01-17 Today's Date: 11/12/2016   History of Present Illness  Pt adm with respiratory failure, sepsis, and PNA. PMH - dementia, COPD, respiratory failure  Clinical Impression  Pt admitted with above diagnosis and presents to PT with functional limitations due to deficits listed below (See PT problem list). Pt needs skilled PT to maximize independence and safety to allow discharge to SNF. Pt very weak and with low activity tolerance. Recommend Palliative Medicine Consult.     Follow Up Recommendations SNF (unless ALF able to provide physical assist for all mobility)    Equipment Recommendations  Other (comment) (to be assessed)    Recommendations for Other Services Other (comment) (Palliative care)     Precautions / Restrictions Precautions Precautions: Fall Restrictions Weight Bearing Restrictions: No      Mobility  Bed Mobility Overal bed mobility: Needs Assistance Bed Mobility: Supine to Sit     Supine to sit: Mod assist;+2 for safety/equipment     General bed mobility comments: Assist to elevate trunk into sitting  Transfers Overall transfer level: Needs assistance Equipment used: Rolling walker (2 wheeled) Transfers: Sit to/from Stand Sit to Stand: Min assist;+2 safety/equipment         General transfer comment: Assist to bring hips up and for balance  Ambulation/Gait Ambulation/Gait assistance: Min assist;+2 safety/equipment Ambulation Distance (Feet): 20 Feet Assistive device: Rolling walker (2 wheeled) Gait Pattern/deviations: Step-through pattern;Decreased step length - right;Decreased step length - left;Shuffle;Trunk flexed Gait velocity: decr Gait velocity interpretation: <1.8 ft/sec, indicative of risk for recurrent falls General Gait Details: Assist for balance and support. Pt amb on 3L of O2 with SpO2 to 87%. Incr O2 to 4L.  Stairs             Wheelchair Mobility    Modified Rankin (Stroke Patients Only)       Balance Overall balance assessment: Needs assistance Sitting-balance support: Bilateral upper extremity supported;Feet supported Sitting balance-Leahy Scale: Poor Sitting balance - Comments: UE support   Standing balance support: Bilateral upper extremity supported Standing balance-Leahy Scale: Poor Standing balance comment: walker and min A for static standing                             Pertinent Vitals/Pain Pain Assessment: Faces Faces Pain Scale: No hurt    Home Living Family/patient expects to be discharged to:: Assisted living                      Prior Function           Comments: Unknown. Likely ambulatory     Hand Dominance        Extremity/Trunk Assessment   Upper Extremity Assessment Upper Extremity Assessment: Defer to OT evaluation    Lower Extremity Assessment Lower Extremity Assessment: Generalized weakness       Communication   Communication: HOH  Cognition Arousal/Alertness: Awake/alert Behavior During Therapy: Anxious Overall Cognitive Status: No family/caregiver present to determine baseline cognitive functioning                 General Comments: Pt with severe dementia    General Comments      Exercises     Assessment/Plan    PT Assessment Patient needs continued PT services  PT Problem List Decreased strength;Decreased activity tolerance;Decreased balance;Decreased mobility;Decreased knowledge of use of DME;Decreased safety awareness;Cardiopulmonary status limiting activity  PT Treatment Interventions DME instruction;Gait training;Functional mobility training;Therapeutic activities;Therapeutic exercise;Balance training;Patient/family education    PT Goals (Current goals can be found in the Care Plan section)  Acute Rehab PT Goals Patient Stated Goal: unable to state PT Goal Formulation: Patient unable to participate in  goal setting Time For Goal Achievement: 11/26/16 Potential to Achieve Goals: Fair    Frequency Min 3X/week   Barriers to discharge        Co-evaluation               End of Session Equipment Utilized During Treatment: Gait belt Activity Tolerance: Patient limited by fatigue Patient left: in chair;with call bell/phone within reach;with chair alarm set Nurse Communication: Mobility status PT Visit Diagnosis: Unsteadiness on feet (R26.81);Muscle weakness (generalized) (M62.81)         Time: [id number] PT Time Calculation (min) (ACUTE ONLY): 25 min   Charges:   PT Evaluation $PT Eval Moderate Complexity: 1 Procedure PT Treatments $Gait Training: 8-22 mins   PT G CodesAngelina Ok:         Shaylen Nephew W Maycok 11/12/2016, 3:05 PM [employer] CorporationCary Omara Alcon PT [phone]

## 2016-11-12 NOTE — Progress Notes (Signed)
Family Medicine Teaching Service Daily Progress Note Intern Pager: [phone]  Patient name: Stacie Wright Medical record number: [id number] Date of birth: [birth year] Age: 83 y.o. Gender: female  Primary Care Provider: Wende Neighbors, MD Consultants: CCM Code Status: DO NOT RESUSCITATE  Pt Overview and Major Events to Date:  2/26 Admit to FPTS  Assessment and Plan: Stacie Wright is a 83 y.o. female presenting with respiratory failure. PMH is significant for COPD, MAC, bronchiectasis, DVT x 2, and dementia.   Acute hypoxic respiratory failure with severe sepsis, improving:  COPD and recent completed abx course for MAC infection. Improving.CXR with increasing density in RUL.  - Cont cefepime (2/26 - ), DC vanc, narrow abx as cultures available - Vitals per floor protocol - Wean O2  - ABG if acute change in mental status - Swallow eval - NPO pending further eval today - PT/OT consult  Influenza - Flu A positive - Started Tamiflu (2/27 - )  COPD: Taking brovana daily. S/p solumedrol in ED. Troponin 0.33>0.31>0.30 likely demand ischemia. - continue brovana - seems to be more of a PNA picture, will DC steroids - consider scheduled duonebs  History of DVT: Multiple DVTs. Continue eliquis 2.5 mg BID  AKI, improved - Cr baseline ~0.88, elevated at 1.61>>0.91 this AM.  Hypertension: Takes lopressor 71m daily - hold home medication w/presentation of hypotension  GAD: Takes paroxetine 242mdaily and xanax 0.103m34m6hrs PRN anxiety at home. - Will reduce xanax dose to 0.25 mg daily in setting of tenuous respiratory status - patient got extra PRN 0.25 o/n 2/26 for agitation - continue paroxetine 20 mg Qd  Alzheimers dementia: Resides at memory unit. Taking resperidol 1mg43mily and paxil 20mg33mly and xanax 0.103mg q4103ms PRN anxiety. - continue home regimen but with reduced dose of xanax  FEN/GI: s/p 3L boluses/maintenance IV fluids/protonix Prophylaxis: eliquis  Disposition: transfer  to med surg once bed is available  Subjective:  Patient seen and examined at bedside. Per nursing at bedside patient was pulling pulse ox off all night. Patient pleasantly with dementia.   Objective: Temp:  [98.5 F (36.9 C)-100.1 F (37.8 C)] 98.9 F (37.2 C) (02/28 0400) Pulse Rate:  [86-102] 86 (02/28 0400) Resp:  [24-34] 24 (02/28 0400) BP: (101-150)/(48-65) 109/49 (02/28 0400) SpO2:  [91 %-100 %] 92 % (02/28 0400) Weight:  [71.8 kg (158 lb 4.8 oz)] 71.8 kg (158 lb 4.8 oz) (02/28 0400) Physical Exam General: NAD, rests comfortably in bed, conversant, pleasant Cardiovascular: RRR, no murmurs rubs or gallops Respiratory: +diffuse rhonchi throughout all lung fields, , comfortable work of breathing, speaks in full sentences, good effort inspiration Abdomen: Soft and nontender, nondistended, no hepatosplenomegaly Extremities: Moves 4 extremities equally, no lower extremity edema  Laboratory:  Recent Labs Lab 11/10/16 0846 11/11/16 0338 11/12/16 0430  WBC 19.2* 15.2* 7.3  HGB 12.5 11.5* 10.3*  HCT 38.4 35.4* 32.2*  PLT 146* 127* 106*    Recent Labs Lab 11/10/16 0846 11/10/16 1108 11/11/16 0338 11/12/16 0430  NA 136 138 140 137  K 5.6* 4.8 4.1 4.0  CL 101 106 106 106  CO2 _0 BUN 29* 29* 25* 18  CREATININE 1.61* 1.40* 1.27* 0.91  CALCIUM 8.7* 8.0* 8.5* 7.9*  PROT 6.3*  --  6.1*  --   BILITOT 1.6*  --  1.0  --   ALKPHOS 33*  --  35*  --   ALT 10*  --  25  --   AST 45*  --  49*  --   GLUCOSE 159* 185* 92 87    Imaging/Diagnostic Tests: Dg Chest Port 1 View 11/11/2016 FINDINGS: Cardiomegaly with normal pulmonary vascularity. Right upper lobe and bilateral lower lobe pulmonary infiltrates. Interim significant progression from prior exam. Small right pleural effusion. No pneumothorax.  IMPRESSION: Right upper lobe and bilateral lower lobe infiltrates noted consistent with pneumonia. Findings have progressed significantly from prior exam. Small right pleural  effusion Electronically Signed   By: Marcello Moores  Register   On: 11/11/2016 07:35   Dg Chest Portable 1 View 11/10/2016 FINDINGS: Patchy airspace disease noted in the right upper lobe and both lung bases. This is similar to prior study and prior CT in the lung bases, likely related to bronchiectasis seen on prior CT. This is increased slightly in the right upper lobe. Heart is normal size. No effusions or acute bony abnormality.  IMPRESSION: Bronchiectatic changes in the lung bases. Increasing density in the right upper lobe. Cannot exclude superimposed pneumonia.     Everrett Coombe, MD 11/12/2016, 8:00 AM PGY-1, [place] Intern pager: [phone], text pages welcome

## 2016-11-12 NOTE — Discharge Summary (Signed)
Stacie Wright Discharge Summary  Patient name: Stacie Wright Medical record number: [id number] Date of birth: 1927/12/12 Age: 83 y.o. Gender: female Date of Admission: 11/10/2016  Date of Discharge: 11/17/2016 Admitting Physician: Zenia Resides, MD   Primary Care Provider: Wende Neighbors, MD Consultants: Stacie Wright  Indication for Hospitalization: Acute hypoxic respiratory failure with severe sepsis  Discharge Diagnoses/Problem List:  Patient Active Problem List   Diagnosis Date Noted  . HCAP (healthcare-associated pneumonia)   . Respiratory failure ([place]) 11/10/2016  . Pneumonia of right lung due to infectious organism   . Lactic acidosis   . Sepsis ([place])   . Obstructive bronchiectasis ([place]) 11/01/2015  . Bronchiectasis ([place]) 10/11/2015  . Acute respiratory failure with hypoxia ([place]) 10/11/2015  . Acute exacerbation of bronchiectasis ([place]) 10/11/2015  . Mold exposure 06/20/2015  . Generalized anxiety disorder 01/19/2015  . Depression 01/19/2015  . Dementia in Alzheimer's disease 01/19/2015  . Mycobacterium avium-intracellulare infection ([place]) 01/18/2015  . Influenza with pneumonia 01/15/2015  . Cellulitis 06/09/2013  . Infected hardware in left leg ([place]) 06/09/2013  . Constipation 04/27/2013  . Pneumonia, organism unspecified([place]) 04/26/2013  . COPD exacerbation ([place]) 04/26/2013  . Bronchiectasis without acute exacerbation ([place]) 04/26/2013  . Pulmonary nodule 04/26/2013  . Acute bronchitis 04/25/2013  . Abnormal chest x-ray 04/25/2013  . Generalized weakness 04/25/2013  . Hypoxia 04/25/2013  . Hypertension     Disposition: SNF  Discharge Condition: Stable/Improved  Discharge Exam:  General: NAD, resting comfortably in bed, alert  Cardiovascular: RRR, no murmurs rubs or gallops Respiratory:  Mild expiratory wheezes bilaterally, comfortable work of breathing on 3L O2 by [place] Abdomen: Soft, NTND, +bs Extremities: Moves 4 extremities equally, no lower  extremity edema noted  Brief Wright Course:  83 year old presenting with respiratory failure. PMH is significant for COPD, MAC, bronchiectasis, DVT x 2, and dementia.  Sepsis and HCAP Patient admitted with severe sepsis due to HCAP and influenza A.  THe patient was initially treated with vancomycin and cefepime.  Once cultures became available, vancomycin was discontinued.  She was transitioned to Levaquin for a total of 10 days of antibiotics.  Blood cultures with no growth. For flu, Tamiflu was given and the course was completed. She continued to have increased secretions and congestion with an O2 requirement of 3L by [place]. Due to tenuous respiratory status and secretions, unable to assess ability to swallow.  SLP seen and recommended nectar thick diet.     On 3/1 with increased work of breathing and congestion.  Stat CXR was ordered which revealed  worsening pulm edema.  Fluids stopped and ordered IV Lasix 40 mg x1 and reassessed for fluid and respiratory status later in day. Scheduled duonebs and prn albuterol was also ordered.  Did not require further Lasix. Appears to be doing much better with no leg swelling, shortness of breath or increased work of breathing. She was evaluated daily for further need for diuresis.  She was given a steroid burst on 3/3 which will be continued for total course of 5 days which further improved her respiratory status.  Electrolytes were repleted as needed.  On the morning of discharge, K+ 3.2 and oral K+ solution was given and will need to be followed up on with BMET.  At time of discharge patient was stable.     Issues for Follow Up:  1. Hypokalemia, Check BMET  2. Respiratory status 3. Patient to complete steroid burst and Levaquin course.  4. Blood pressure  Significant Procedures: None  Significant Labs and Imaging:   Recent Labs Lab 11/12/16 0430 11/14/16 0525 11/15/16 0602  WBC 7.3 4.2 3.6*  HGB 10.3* 10.4* 10.4*  HCT 32.2* 32.4* 32.1*  PLT  106* 124* 130*    Recent Labs Lab 11/11/16 0338 11/12/16 0430 11/14/16 0525 11/15/16 0602 11/15/16 1038 11/16/16 0447 11/17/16 0422  NA 140 137 140 141  --  141 140  K 4.1 4.0 3.0* 3.0*  --  3.6 3.2*  CL 106 106 100* 103  --  101 99*  CO2 25 24 29 30   --  33* 33*  GLUCOSE 92 87 83 102*  --  164* 132*  BUN 25* 18 14 11   --  9 13  CREATININE 1.27* 0.91 1.00 0.80  --  0.75 0.85  CALCIUM 8.5* 7.9* 7.8* 8.0*  --  8.1* 8.4*  MG  --   --   --   --  1.2* 1.8  --   PHOS  --   --   --   --  2.3* 3.2  --   ALKPHOS 35*  --   --   --   --   --   --   AST 49*  --   --   --   --   --   --   ALT 25  --   --   --   --   --   --   ALBUMIN 3.1*  --   --   --   --  2.3*  --     Results/Tests Pending at Time of Discharge: None  Discharge Medications:  Allergies as of 11/17/2016      Reactions   Amoxicillin Anaphylaxis, Rash   Darvocet [propoxyphene N-acetaminophen] Other (See Comments)   hallucinations   Penicillins Anaphylaxis, Rash   Has patient had a PCN reaction causing immediate rash, facial/tongue/throat swelling, SOB or lightheadedness with hypotension: Yes Has patient had a PCN reaction causing severe rash involving mucus membranes or skin necrosis: No Has patient had a PCN reaction that required hospitalization No Has patient had a PCN reaction occurring within the last 10 years: No If all of the above answers are "NO", then may proceed with Cephalosporin use.   Sulfa Antibiotics Anaphylaxis, Rash   Propoxyphene Other (See Comments)   hallucinations      Medication List    TAKE these medications   acetaminophen 500 MG tablet Commonly known as:  TYLENOL Take 500 mg by mouth every 6 (six) hours as needed for moderate pain.   allopurinol 300 MG tablet Commonly known as:  ZYLOPRIM Take 300 mg by mouth daily.   ALPRAZolam 0.5 MG tablet Commonly known as:  XANAX Take 0.5 mg by mouth every 6 (six) hours as needed for anxiety.   apixaban 5 MG Tabs tablet Commonly known as:   ELIQUIS Take 2 tablets (10 mg total) by mouth 2 (two) times daily. What changed:  how much to take   azithromycin 500 MG tablet Commonly known as:  ZITHROMAX Take 1 tablet (500 mg total) by mouth 3 (three) times a week. What changed:  when to take this   BROVANA 15 MCG/2ML Nebu Generic drug:  arformoterol Take 15 mcg by nebulization daily.   dorzolamide 2 % ophthalmic solution Commonly known as:  TRUSOPT Place 1 drop into both eyes 2 (two) times daily.   ethambutol 400 MG tablet Commonly known as:  MYAMBUTOL Take 4 tablets (1,600 mg total) by mouth 3 (  three) times a week. What changed:  when to take this   latanoprost 0.005 % ophthalmic solution Commonly known as:  XALATAN Place 1 drop into both eyes at bedtime.   levofloxacin 750 MG tablet Commonly known as:  LEVAQUIN Take 1 tablet (750 mg total) by mouth every other day. Start taking on:  11/18/2016   metoprolol tartrate 25 MG tablet Commonly known as:  LOPRESSOR Take 25 mg by mouth daily.   PARoxetine 20 MG tablet Commonly known as:  PAXIL Take 20 mg by mouth daily.   predniSONE 50 MG tablet Commonly known as:  DELTASONE Take one tablet by mouth once a day   rifampin 300 MG capsule Commonly known as:  RIFADIN Take 2 capsules (600 mg total) by mouth 3 (three) times a week. What changed:  when to take this   risperiDONE 1 MG tablet Commonly known as:  RISPERDAL Take 1 mg by mouth at bedtime.   triamterene-hydrochlorothiazide 37.5-25 MG tablet Commonly known as:  MAXZIDE-25 Take 1 tablet by mouth daily.       Discharge Instructions: Please refer to Patient Instructions section of EMR for full details.  Patient was counseled important signs and symptoms that should prompt return to medical care, changes in medications, dietary instructions, activity restrictions, and follow up appointments.   Follow-Up Appointments: Contact information for after-discharge care    Destination    [facility] .   Specialty:  [place] information: [address] [place] [phone]              Lovenia Kim, MD 11/17/2016, 12:16 PM PGY-1, [place]

## 2016-11-12 NOTE — Progress Notes (Signed)
transferred to 6 North bed 26 per bed with belongings that were a few items of clothing Son called to let him know that his mother is being transferred to a new [address]

## 2016-11-12 NOTE — Progress Notes (Signed)
  Speech Language Pathology Treatment: Dysphagia  Patient Details Name: Scotty CourtDorothy H Mcnabb MRN: [id number] DOB: 14-Aug-1928 Today's Date: 11/12/2016 Time: [id number] SLP Time Calculation (min) (ACUTE ONLY): 17 min  Assessment / Plan / Recommendation Clinical Impression  Goal of treatment was to receive informed consent for FEES. Noted pt very congested with dry, mucous coated lips and pt with baseline cough and unable to expectorate phlegm. Pt given ice chip to loosen secretions with mild to moderate success. FEES planned for today at 11:00.   HPI HPI: Pt is a 83 year old female with PMH of dementia and bronchiectasis with COPD and concern for MAI who presented from SNF with SOB and hypotension. Patient had 2 days of cough and increase WOB. Found to have a sat of 71% on RA on presentation. Patient is on BiPAP and has severe dementia and unable to provide further history. Son is HCPOA. CXR showed right upper lobe and bilateral lower lobe infiltrates noted consistent with pneumonia. Findings have progressed significantly from prior exam. Small right pleural effusion. Barium esophagram on 11/20/2014 showed diffuse age-related esophageal dysmotility. Patient unable to swallow a 12.5 mm barium tablet. Vallecular residuals. No prior ST notes.      SLP Plan  New goals to be determined pending instrumental study       Recommendations  Diet recommendations: NPO                Oral Care Recommendations: Oral care QID Follow up Recommendations:  (TBD) SLP Visit Diagnosis: Dysphagia, unspecified (R13.10) Plan: New goals to be determined pending instrumental study       GO                [name], Nolyn Swab Willis 11/12/2016, 9:14 AM  Breck CoonsLisa Willis Lonell FaceLitaker M.Ed [employer]-SLP Pager [phone]

## 2016-11-12 NOTE — Progress Notes (Signed)
Called report to [address] [place] spoke to Constellation BrandsJoan  RN

## 2016-11-12 NOTE — Progress Notes (Signed)
  Speech Language Pathology Treatment:  Patient Details Name: Stacie Wright MRN: [id number] DOB: June 29, 1928 Today's Date: 11/12/2016 Time: [id number] SLP Time Calculation (min) (ACUTE ONLY): 17 min    Came to pt's room for FEES (swallow evaluation). Pt sitting in chair with significant congestion and coughing with SpO2 dropping to 85%. O2 turned up to 7 without significant improvement, then to 10 with reading of 93%. Pt very confused, stating "I'm not sure about this; I need to go to the hospital, help me." Did not proceed with FEES and do not think an objective swallow assessment is warranted if she continues to decline. Would she benefit from Palliative care? Family may wish for comfort feeds (thin liquids and chopped texture Dys 2?). ST will follow for decision and any family education needed.       11/12/2016, 11:50 AM   Breck CoonsLisa Willis Lonell FaceLitaker M.Ed [employer]-SLP Pager [phone]

## 2016-11-13 ENCOUNTER — Inpatient Hospital Stay (HOSPITAL_COMMUNITY): Payer: PPO

## 2016-11-13 DIAGNOSIS — J189 Pneumonia, unspecified organism: Secondary | ICD-10-CM

## 2016-11-13 MED ORDER — DEXTROSE 5 % IV SOLN
1.0000 g | INTRAVENOUS | Status: DC
  Administered 2016-11-14: 1 g via INTRAVENOUS
  Filled 2016-11-13: qty 1

## 2016-11-13 MED ORDER — DEXTROSE 5 % IV SOLN: 1.0000 g | INTRAVENOUS | Status: DC

## 2016-11-13 MED ORDER — FUROSEMIDE 10 MG/ML IJ SOLN
40.0000 mg | Freq: Once | INTRAMUSCULAR | Status: AC
  Administered 2016-11-13: 40 mg via INTRAVENOUS
  Filled 2016-11-13: qty 4

## 2016-11-13 NOTE — Progress Notes (Signed)
FPTS Interim Progress Note  Myself and Dr. Nancy MarusMayo saw patient at 1 PM.  Did appear to be in mild respiratory distress and stating she wants to go home.  Continues on 3L O2 by [place] with no leg swelling.  Ordered stat CXR given her worsening respiratory status which showed increased bilateral diffuse interstitial densities concerning for worsening pulmonary edema.  Stopped her fluids and administered 40 mg IV Lasix.    Dr. Nancy MarusMayo will evaluate later on this afternoon to decide if she needs further diuresis.    Stacie MarchYashika Goldye Tourangeau, MD 11/13/2016, 1:41 PM PGY-1, [facility] pager [phone]

## 2016-11-13 NOTE — Progress Notes (Signed)
    Patient Details Name: Stacie Wright MRN: [id number] DOB: Nov 13, 1927 Today's Date: 11/13/2016 Time:  -       Spoke with resident and discussed current status. Resident stated she will speak with family to determine desires for pt's care and nutrition. Palliative care recommended. SLP will follow up for decision and educate re: comfort feeds.   [name], Stacie Wright 11/13/2016, 3:20 PM   [name] M.Ed [employer] Pager [phone]

## 2016-11-13 NOTE — Progress Notes (Signed)
Late Progress Note: Evaluated patient around 6:00pm. Pt feels that her breathing is improved. She states she has been urinating a lot. On exam, her respiratory distress is improved from previous exam. She is moving air a little better. Still with diffuse rhonchi. Will monitor closely overnight. May need to give an additional dose of Lasix tonight if her work of breathing worsens.  Willadean CarolKaty Tyneshia Stivers, MD PGY-2

## 2016-11-13 NOTE — [place] FL2 (Signed)
[place] MEDICAID FL2 LEVEL OF CARE SCREENING TOOL     IDENTIFICATION  Patient Name: Stacie Wright Birthdate: July 30, 1928 Sex: female Admission Date (Current Location): 11/10/2016  [facility] and [place] Number:  Producer, television/film/video and Address:  [facility] [place] [place], [address] [place], [place] [address]      Provider Number: [id number]  Attending Physician Name and Address:  Moses Manners, MD  Relative Name and Phone Number:  Harvie Heck, son, [phone]    Current Level of Care: Hospital Recommended Level of Care: Skilled Nursing Facility Prior Approval Number:    Date Approved/Denied:   PASRR Number:    Discharge Plan: SNF    Current Diagnoses: Patient Active Problem List   Diagnosis Date Noted  . HCAP (healthcare-associated pneumonia)   . Respiratory failure (HCC) 11/10/2016  . Pneumonia of right lung due to infectious organism   . Lactic acidosis   . Sepsis (HCC)   . Obstructive bronchiectasis (HCC) 11/01/2015  . Bronchiectasis (HCC) 10/11/2015  . Acute respiratory failure with hypoxia (HCC) 10/11/2015  . Acute exacerbation of bronchiectasis (HCC) 10/11/2015  . Mold exposure 06/20/2015  . Generalized anxiety disorder 01/19/2015  . Depression 01/19/2015  . Dementia in Alzheimer's disease 01/19/2015  . Mycobacterium avium-intracellulare infection (HCC) 01/18/2015  . Influenza with pneumonia 01/15/2015  . Cellulitis 06/09/2013  . Infected hardware in left leg (HCC) 06/09/2013  . Constipation 04/27/2013  . Pneumonia, organism unspecified(486) 04/26/2013  . COPD exacerbation (HCC) 04/26/2013  . Bronchiectasis without acute exacerbation (HCC) 04/26/2013  . Pulmonary nodule 04/26/2013  . Acute bronchitis 04/25/2013  . Abnormal chest x-ray 04/25/2013  . Generalized weakness 04/25/2013  . Hypoxia 04/25/2013  . Hypertension     Orientation RESPIRATION BLADDER Height & Weight     Self  O2 (3L) Continent Weight: 158 lb 4.8 oz (71.8  kg) Height:  5\' 8"  (172.7 cm)  BEHAVIORAL SYMPTOMS/MOOD NEUROLOGICAL BOWEL NUTRITION STATUS      Continent  (NPO)  AMBULATORY STATUS COMMUNICATION OF NEEDS Skin   Extensive Assist Verbally Normal                       Personal Care Assistance Level of Assistance  Bathing, Feeding, Dressing Bathing Assistance: Maximum assistance Feeding assistance: Maximum assistance (NPO) Dressing Assistance: Maximum assistance     Functional Limitations Info  Sight, Hearing, Speech Sight Info: Adequate Hearing Info: Adequate Speech Info: Adequate    SPECIAL CARE FACTORS FREQUENCY  PT (By licensed PT), OT (By licensed OT)     PT Frequency: 5 OT Frequency: 5            Contractures Contractures Info: Not present    Additional Factors Info  Code Status, Allergies, Psychotropic, Isolation Precautions Code Status Info: DNR Allergies Info: Amoxicillin, Darvocet Propoxyphene N-acetaminophen, Penicillins, Sulfa Antibiotics, Propoxyphene Psychotropic Info: risperiDONE (RISPERDAL), PARoxetine (PAXIL); see med list   Isolation Precautions Info: Droplet Precautions     Current Medications (11/13/2016):  This is the current hospital active medication list Current Facility-Administered Medications  Medication Dose Route Frequency Provider Last Rate Last Dose  . acetaminophen (TYLENOL) tablet 650 mg  650 mg Oral Q6H PRN Casey Burkitt, MD   650 mg at 11/12/16 0225   Or  . acetaminophen (TYLENOL) suppository 650 mg  650 mg Rectal Q6H PRN Casey Burkitt, MD      . allopurinol (ZYLOPRIM) tablet 300 mg  300 mg Oral Daily Hillary Percell Boston, MD   300 mg  at 11/13/16 1006  . ALPRAZolam (XANAX) tablet 0.25 mg  0.25 mg Oral Q6H PRN Casey BurkittHillary Moen Fitzgerald, MD   0.25 mg at 11/13/16 1148  . apixaban (ELIQUIS) tablet 2.5 mg  2.5 mg Oral BID Casey BurkittHillary Moen Fitzgerald, MD   2.5 mg at 11/13/16 1000  . arformoterol (BROVANA) nebulizer solution 15 mcg  15 mcg Nebulization Daily Casey BurkittHillary  Moen Fitzgerald, MD   15 mcg at 11/13/16 [phone]  . benzonatate (TESSALON) capsule 100 mg  100 mg Oral TID Casey BurkittHillary Moen Fitzgerald, MD   100 mg at 11/13/16 1455  . [START ON 11/14/2016] ceFEPIme (MAXIPIME) 1 g in dextrose 5 % 50 mL IVPB  1 g Intravenous Q24H Freddrick MarchYashika Amin, MD      . dorzolamide (TRUSOPT) 2 % ophthalmic solution 1 drop  1 drop Both Eyes BID Casey BurkittHillary Moen Fitzgerald, MD   1 drop at 11/13/16 1000  . guaiFENesin (ROBITUSSIN) 100 MG/5ML solution 200 mg  10 mL Oral Q4H PRN Moses MannersWilliam A Hensel, MD   200 mg at 11/11/16 2205  . latanoprost (XALATAN) 0.005 % ophthalmic solution 1 drop  1 drop Both Eyes QHS Casey BurkittHillary Moen Fitzgerald, MD   1 drop at 11/12/16 2220  . MEDLINE mouth rinse  15 mL Mouth Rinse BID Moses MannersWilliam A Hensel, MD   15 mL at 11/13/16 1000  . oseltamivir (TAMIFLU) capsule 30 mg  30 mg Oral BID Allena Katzaroline E Welles, RPH   30 mg at 11/13/16 1100  . PARoxetine (PAXIL) tablet 20 mg  20 mg Oral Daily Casey BurkittHillary Moen Fitzgerald, MD   20 mg at 11/13/16 1006  . polyethylene glycol (MIRALAX / GLYCOLAX) packet 17 g  17 g Oral Daily Howard PouchLauren Feng, MD   17 g at 11/13/16 1000  . risperiDONE (RISPERDAL) tablet 1 mg  1 mg Oral QHS Casey BurkittHillary Moen Fitzgerald, MD   1 mg at 11/12/16 2219  . sodium chloride flush (NS) 0.9 % injection 3 mL  3 mL Intravenous Q12H Casey BurkittHillary Moen Fitzgerald, MD   3 mL at 11/13/16 1000     Discharge Medications: Please see discharge summary for a list of discharge medications.  Relevant Imaging Results:  Relevant Lab Results:   Additional Information SSN: [id number]  Dominic PeaJeneya G Darsha Zumstein, LCSW

## 2016-11-13 NOTE — Progress Notes (Signed)
Family Medicine Teaching Service Daily Progress Note Intern Pager: [phone]  Patient name: Stacie Wright Medical record number: [id number] Date of birth: [birth year] Age: 83 y.o. Gender: female  Primary Care Provider: Wende Neighbors, MD Consultants: CCM Code Status: DO NOT RESUSCITATE  Pt Overview and Major Events to Date:  2/26 Admit to FPTS   Assessment and Plan:  Stacie Wright is a 83 y.o. female presenting with respiratory failure. PMH is significant for COPD, MAC, bronchiectasis, DVT x 2, and dementia.   Acute hypoxic respiratory failure with severe sepsis, improving:  COPD and recent completed abx course for MAC infection. Improving. CXR with increasing density in RUL.   Has remained afebrile with VSS.  - Day 4 cefepime (2/26 - ), DC vanc, Bcx NG2D.  Plan to continue IV antibiotics for few more days then consider switching to po antibiotics.  - Vitals per floor protocol  - Currently on 3L [place], plan to wean once appearing better  - NPO pending swallow eval.  SLP unable to assess as patient continues to decline.  May need to discuss goals of care with family and see if they wish for comfort feeds.   - ABG if acute change in mental status  - PT/OT consult - recc SNF   Influenza - Flu A positive  - Started Tamiflu (2/27 - )   COPD: Taking brovana daily. S/p solumedrol in ED. Troponin 0.33>0.31>0.30 likely demand ischemia.  Significant congestion and secretions this AM.   - continue brovana - seems to be more of a PNA picture, will DC steroids - consider scheduled duonebs  History of DVT: Multiple DVTs. Continue eliquis 2.5 mg BID.   AKI, improved - Cr baseline ~0.88, elevated at 1.61>>0.91 this AM.       Hypertension: Takes lopressor 13m daily .  Normotensive this AM.  - hold home medication w/presentation of hypotension   GAD: Takes paroxetine 224mdaily and xanax 0.38m24m6hrs PRN anxiety at home  - Will reduce Xanax dose to 0.25 mg daily in setting of tenuous respiratory  status - patient got extra PRN 0.25 o/n 2/26 for agitation  - consider weaning off Xanax  - continue home paroxetine 20 mg Qd   Alzheimers dementia: Resides at memory unit. Taking resperidol 1mg39mily and paxil 20mg41mly and xanax 0.38mg q73ms PRN anxiety. - continue home regimen but with reduced dose of xanax in setting of respiratory distress  FEN/GI: NS @ 100 cc/hour /protonix Prophylaxis: eliquis  Disposition: dispo pending clinical improvement   Subjective:  Patient ill appearing but comfortable.  Reports she does not have trouble breathing. Patient pleasantly demented.   Objective: Temp:  [98.2 F (36.8 C)-98.6 F (37 C)] 98.6 F (37 C) (03/01 0509) Pulse Rate:  [94-99] 99 (03/01 0509) Resp:  [20-24] 20 (03/01 0509) BP: (105-142)/(59-72) 128/72 (03/01 0509) SpO2:  [92 %-96 %] 93 % (03/01 0835)   Physical Exam General: NAD, resting comfortably in bed, pleasant  Cardiovascular: RRR, no murmurs rubs or gallops Respiratory: +diffuse rhonchi throughout all lung fields, increased secretions with congestion, comfortable work of breathing on 3L O2 by [place] Abdomen: Soft, nontender, nondistended, no hepatosplenomegaly Extremities: Moves 4 extremities equally, no lower extremity edema noted  Laboratory:  Recent Labs Lab 11/10/16 0846 11/11/16 0338 11/12/16 0430  WBC 19.2* 15.2* 7.3  HGB 12.5 11.5* 10.3*  HCT 38.4 35.4* 32.2*  PLT 146* 127* 106*    Recent Labs Lab 11/10/16 0846 11/10/16 1108 11/11/16 0338 11/12/16 0430  NA 136 138  140 137  K 5.6* 4.8 4.1 4.0  CL 101 106 106 106  CO2 _0 BUN 29* 29* 25* 18  CREATININE 1.61* 1.40* 1.27* 0.91  CALCIUM 8.7* 8.0* 8.5* 7.9*  PROT 6.3*  --  6.1*  --   BILITOT 1.6*  --  1.0  --   ALKPHOS 33*  --  35*  --   ALT 10*  --  25  --   AST 45*  --  49*  --   GLUCOSE 159* 185* 92 87    Imaging/Diagnostic Tests: Dg Chest Port 1 View 11/11/2016 FINDINGS: Cardiomegaly with normal pulmonary vascularity. Right upper  lobe and bilateral lower lobe pulmonary infiltrates. Interim significant progression from prior exam. Small right pleural effusion. No pneumothorax.  IMPRESSION: Right upper lobe and bilateral lower lobe infiltrates noted consistent with pneumonia. Findings have progressed significantly from prior exam. Small right pleural effusion Electronically Signed   By: Marcello Moores  Register   On: 11/11/2016 07:35   Dg Chest Portable 1 View 11/10/2016 FINDINGS: Patchy airspace disease noted in the right upper lobe and both lung bases. This is similar to prior study and prior CT in the lung bases, likely related to bronchiectasis seen on prior CT. This is increased slightly in the right upper lobe. Heart is normal size. No effusions or acute bony abnormality.  IMPRESSION: Bronchiectatic changes in the lung bases. Increasing density in the right upper lobe. Cannot exclude superimposed pneumonia.    Lovenia Kim, MD 11/13/2016, 1:38 PM PGY-1, [place] Intern pager: [phone], text pages welcome

## 2016-11-14 LAB — BASIC METABOLIC PANEL
ANION GAP: 11 (ref 5–15)
BUN: 14 mg/dL (ref 6–20)
CALCIUM: 7.8 mg/dL — AB (ref 8.9–10.3)
CO2: 29 mmol/L (ref 22–32)
Chloride: 100 mmol/L — ABNORMAL LOW (ref 101–111)
Creatinine, Ser: 1 mg/dL (ref 0.44–1.00)
GFR calc Af Amer: 57 mL/min — ABNORMAL LOW (ref >60)
GFR, EST NON AFRICAN AMERICAN: 50 mL/min — AB (ref >60)
GLUCOSE: 83 mg/dL (ref 65–99)
Potassium: 3 mmol/L — ABNORMAL LOW (ref 3.5–5.1)
SODIUM: 140 mmol/L (ref 135–145)

## 2016-11-14 LAB — CBC
HCT: 32.4 % — ABNORMAL LOW (ref 36.0–46.0)
Hemoglobin: 10.4 g/dL — ABNORMAL LOW (ref 12.0–15.0)
MCH: 31.9 pg (ref 26.0–34.0)
MCHC: 32.1 g/dL (ref 30.0–36.0)
MCV: 99.4 fL (ref 78.0–100.0)
PLATELETS: 124 10*3/uL — AB (ref 150–400)
RBC: 3.26 MIL/uL — AB (ref 3.87–5.11)
RDW: 14.2 % (ref 11.5–15.5)
WBC: 4.2 10*3/uL (ref 4.0–10.5)

## 2016-11-14 MED ORDER — ALBUTEROL SULFATE (2.5 MG/3ML) 0.083% IN NEBU: 2.5000 mg | INHALATION_SOLUTION | RESPIRATORY_TRACT | Status: DC | PRN

## 2016-11-14 MED ORDER — RESOURCE THICKENUP CLEAR PO POWD
ORAL | Status: DC | PRN
  Filled 2016-11-14: qty 125

## 2016-11-14 MED ORDER — IPRATROPIUM-ALBUTEROL 0.5-2.5 (3) MG/3ML IN SOLN
3.0000 mL | Freq: Four times a day (QID) | RESPIRATORY_TRACT | Status: DC
  Administered 2016-11-14 (×2): 3 mL via RESPIRATORY_TRACT
  Filled 2016-11-14 (×2): qty 3

## 2016-11-14 MED ORDER — POTASSIUM CHLORIDE CRYS ER 20 MEQ PO TBCR: 40.0000 meq | EXTENDED_RELEASE_TABLET | Freq: Two times a day (BID) | ORAL | Status: DC

## 2016-11-14 MED ORDER — DEXTROSE 5 % IV SOLN
1.0000 g | INTRAVENOUS | Status: DC
  Administered 2016-11-14 – 2016-11-15 (×2): 1 g via INTRAVENOUS
  Filled 2016-11-14 (×4): qty 10

## 2016-11-14 MED ORDER — POTASSIUM CHLORIDE 20 MEQ/15ML (10%) PO SOLN
40.0000 meq | Freq: Once | ORAL | Status: AC
  Administered 2016-11-14: 40 meq via ORAL
  Filled 2016-11-14: qty 30

## 2016-11-14 NOTE — Progress Notes (Signed)
Physical Therapy Treatment Patient Details Name: Stacie Wright MRN: [id number] DOB: Feb 01, 1928 Today's Date: 11/14/2016    History of Present Illness Pt adm with respiratory failure, sepsis, and PNA. PMH - dementia, COPD, respiratory failure    PT Comments    Pt admitted with above diagnosis. Pt currently with functional limitations due to the deficits listed below (see PT Problem List). Pt was able to ambulate with min assist and cues with RW.  Limited distance as pt is self limiting.  Had max encouragement to get pt to ambulate distance she did. Pt refused to exercise.  Will follow as able.  Pt will benefit from skilled PT to increase their independence and safety with mobility to allow discharge to the venue listed below.     Follow Up Recommendations  SNF (unless ALF able to provide physical assist for all mobility)     Equipment Recommendations  Other (comment) (to be assessed)    Recommendations for Other Services Other (comment) (Palliative care)     Precautions / Restrictions Precautions Precautions: Fall Restrictions Weight Bearing Restrictions: No    Mobility  Bed Mobility Overal bed mobility: Needs Assistance Bed Mobility: Supine to Sit     Supine to sit: Min assist     General bed mobility comments: assist to lift LEs off bed and elevate trunk.  Transfers Overall transfer level: Needs assistance Equipment used: Rolling walker (2 wheeled) Transfers: Sit to/from Stand Sit to Stand: Min assist         General transfer comment: assist to lift hips and for balance.  Step by step instruction and cues for handplacement.  Ambulation/Gait Ambulation/Gait assistance: Min guard;Min assist Ambulation Distance (Feet): 25 Feet Assistive device: Rolling walker (2 wheeled) Gait Pattern/deviations: Step-through pattern;Decreased step length - right;Decreased step length - left;Shuffle;Trunk flexed Gait velocity: decr Gait velocity interpretation: <1.8 ft/sec,  indicative of risk for recurrent falls General Gait Details: Assist for balance and support. Pt amb on 3L of O2 with SpO2 to 86%.  Cues for pursed lip breathing.     Stairs            Wheelchair Mobility    Modified Rankin (Stroke Patients Only)       Balance Overall balance assessment: Needs assistance Sitting-balance support: Bilateral upper extremity supported;Feet supported Sitting balance-Leahy Scale: Fair     Standing balance support: Bilateral upper extremity supported Standing balance-Leahy Scale: Poor Standing balance comment: walker and min A for static standing                    Cognition Arousal/Alertness: Awake/alert Behavior During Therapy: Anxious Overall Cognitive Status: No family/caregiver present to determine baseline cognitive functioning                 General Comments: Pt with h/o dementia     Exercises      General Comments        Pertinent Vitals/Pain Pain Assessment: Faces Faces Pain Scale: Hurts little more Pain Location: generalized Pain Descriptors / Indicators: Aching;Sore Pain Intervention(s): Limited activity within patient's tolerance;Monitored during session;Repositioned  92% at rest on 3LO2..  86-90% with activity with 3LO2; 90-91% at rest after ambulation on 3LO2.   Home Living                      Prior Function            PT Goals (current goals can now be found in the care plan section) Progress towards  PT goals: Progressing toward goals    Frequency    Min 3X/week      PT Plan Current plan remains appropriate    Co-evaluation             End of Session Equipment Utilized During Treatment: Gait belt Activity Tolerance: Patient limited by fatigue Patient left: in chair;with call bell/phone within reach;with chair alarm set Nurse Communication: Mobility status PT Visit Diagnosis: Unsteadiness on feet (R26.81);Muscle weakness (generalized) (M62.81)     Time: [id number] PT  Time Calculation (min) (ACUTE ONLY): 17 min  Charges:  $Gait Training: 8-22 mins                    G Codes:       Amadeo GarnetDawn F Evee Liska 11/14/2016, 12:18 PM Lilyann Gravelle,PT Acute Rehabilitation [phone] (pager)

## 2016-11-14 NOTE — Progress Notes (Signed)
OT Cancellation Note  Patient Details Name: Scotty CourtDorothy H Stricker MRN: [id number] DOB: 06/06/1928   Cancelled Treatment:    Reason Eval/Treat Not Completed: Other (comment)  Pt was with speech when OT checked on her this day. Will check back later this day or as schedule allows.   Alba CoryREDDING, Montgomery Rothlisberger D 11/14/2016, 11:09 AM

## 2016-11-14 NOTE — Progress Notes (Signed)
Family Medicine Teaching Service Daily Progress Note Intern Pager: [phone]  Patient name: Stacie Wright Medical record number: [id number] Date of birth: Mar 29, 1928 Age: 83 y.o. Gender: female  Primary Care Provider: Wende Neighbors, MD Consultants: CCM Code Status: DO NOT RESUSCITATE   Pt Overview and Major Events to Date:  2/26 Admit to FPTS   Assessment and Plan:  Stacie Wright is a 83 y.o. female presenting with respiratory failure. PMH is significant for COPD, MAC, bronchiectasis, DVT x 2, and dementia.   Acute hypoxic respiratory failure with severe sepsis, improving:  COPD and recent completed abx course for MAC infection. Improving. CXR with increasing density in RUL.   Has remained afebrile with VSS.  - Day 5 cefepime (2/26 - ), DC vanc, Bcx NG2D.  Plan to switch to Rocephin once daily tomorrow for total of 7 days antibiotics.  -SLP reassessed her this AM and recommended nectar thick diet   Will discuss goals of care with family and see if they wish palliative care involvement/ comfort feeds  - Currently on 3L [place], plan to wean once appearing better  - PT/OT consult - recc SNF.  SW to find placement and FL2 form signed.  - Vitals per floor protocol   Worsening pulmonary edema 3/1 with increased work of breathing and congestion.  Stat CXR ordered which revealed worsening pulm edema.  Fluids stopped and ordered IV Lasix 40 mg x1 and reassessed for fluid and respiratory status later in day.  Appeared to be doing much better with no leg swelling, shortness of breath or increased work of breathing.  Did not give further Lasix.   -This AM respiratory status improved  Influenza - Flu A positive  - Continue Tamiflu (2/27 - )   Hypokalemia K+ 3.0 from 4.0 yesterday morning.  Gave 40 mEq Kdur x2 for 1 dose. -Check BMET tomorrow AM   COPD: Taking brovana daily. S/p solumedrol in ED. Troponin 0.33>0.31>0.30 likely demand ischemia.  Significant congestion and secretions this AM.   -  continue brovana - consider scheduled duonebs   History of DVT: Multiple DVTs. Continue eliquis 2.5 mg BID.   AKI, improved - Cr baseline ~0.88, elevated at 0.91>>1.00 this AM.       Hypertension: Takes lopressor 72m daily .  Normotensive this AM 127/53.  - hold home medication w/presentation of hypotension   GAD: Takes paroxetine 240mdaily and xanax 0.79m30m6hrs PRN anxiety at home  - Will reduce Xanax dose to 0.25 mg daily in setting of tenuous respiratory status - consider weaning off Xanax  - continue home paroxetine 20 mg Qd   Alzheimers dementia: Resides at memory unit. Taking resperidol 1mg73mily and paxil 20mg62mly and xanax 0.79mg q66ms PRN anxiety. - continue home regimen but with reduced dose of xanax in setting of respiratory distress  FEN/GI: NS @ 100 cc/hour /protonix Prophylaxis: eliquis  Disposition: dispo pending clinical improvement   Subjective:  Patient ill appearing but comfortable.  No increased work of breathing.   Objective: Temp:  [97.7 F (36.5 C)-98.5 F (36.9 C)] 98.5 F (36.9 C) (03/02 1245) Pulse Rate:  [72-94] 79 (03/02 1245) Resp:  [20] 20 (03/02 0600) BP: (127-151)/(53-65) 131/65 (03/02 1245) SpO2:  [92 %-95 %] 93 % (03/02 1245)   Physical Exam General: NAD, resting comfortably in bed Cardiovascular: RRR, no murmurs rubs or gallops Respiratory: +diffuse rhonchi throughout all lung fields, increased secretions with congestion, comfortable work of breathing on 3L O2 by [place] Abdomen: Soft, nontender, nondistended,  no hepatosplenomegaly Extremities: Moves 4 extremities equally, no lower extremity edema noted  Laboratory:  Recent Labs Lab 11/11/16 0338 11/12/16 0430 11/14/16 0525  WBC 15.2* 7.3 4.2  HGB 11.5* 10.3* 10.4*  HCT 35.4* 32.2* 32.4*  PLT 127* 106* 124*    Recent Labs Lab 11/10/16 0846  11/11/16 0338 11/12/16 0430 11/14/16 0525  NA 136  < > 140 137 140  K 5.6*  < > 4.1 4.0 3.0*  CL 101  < > 106 106 100*  CO2 22   < > _0 BUN 29*  < > 25* 18 14  CREATININE 1.61*  < > 1.27* 0.91 1.00  CALCIUM 8.7*  < > 8.5* 7.9* 7.8*  PROT 6.3*  --  6.1*  --   --   BILITOT 1.6*  --  1.0  --   --   ALKPHOS 33*  --  35*  --   --   ALT 10*  --  25  --   --   AST 45*  --  49*  --   --   GLUCOSE 159*  < > 92 87 83  < > = values in this interval not displayed.  Imaging/Diagnostic Tests: Dg Chest Port 1 View 11/11/2016 FINDINGS: Cardiomegaly with normal pulmonary vascularity. Right upper lobe and bilateral lower lobe pulmonary infiltrates. Interim significant progression from prior exam. Small right pleural effusion. No pneumothorax.  IMPRESSION: Right upper lobe and bilateral lower lobe infiltrates noted consistent with pneumonia. Findings have progressed significantly from prior exam. Small right pleural effusion Electronically Signed   By: Marcello Moores  Register   On: 11/11/2016 07:35   Dg Chest Portable 1 View 11/10/2016 FINDINGS: Patchy airspace disease noted in the right upper lobe and both lung bases. This is similar to prior study and prior CT in the lung bases, likely related to bronchiectasis seen on prior CT. This is increased slightly in the right upper lobe. Heart is normal size. No effusions or acute bony abnormality.  IMPRESSION: Bronchiectatic changes in the lung bases. Increasing density in the right upper lobe. Cannot exclude superimposed pneumonia.    Lovenia Kim, MD 11/14/2016, 4:36 PM PGY-1, [place] Intern pager: [phone], text pages welcome

## 2016-11-14 NOTE — Discharge Instructions (Signed)
Information on my medicine - ELIQUIS (apixaban)  This medication education was reviewed with me or my healthcare representative as part of my discharge preparation.  The pharmacist that spoke with me during my hospital stay was:  Lennon AlstromDang, May Manrique Dien, [facility] CenterRPH  Why was Eliquis prescribed for you? Eliquis was prescribed to treat blood clots that may have been found in the veins of your legs (deep vein thrombosis) or in your lungs (pulmonary embolism) and to reduce the risk of them occurring again.  What do You need to know about Eliquis ? The dose is 2.5 mg (one tablet) taken TWICE daily.  Eliquis may be taken with or without food.   Try to take the dose about the same time in the morning and in the evening. If you have difficulty swallowing the tablet whole please discuss with your pharmacist how to take the medication safely.  Take Eliquis exactly as prescribed and DO NOT stop taking Eliquis without talking to the doctor who prescribed the medication.  Stopping may increase your risk of developing a new blood clot.  Refill your prescription before you run out.  After discharge, you should have regular check-up appointments with your healthcare provider that is prescribing your Eliquis.    What do you do if you miss a dose? If a dose of ELIQUIS is not taken at the scheduled time, take it as soon as possible on the same day and twice-daily administration should be resumed. The dose should not be doubled to make up for a missed dose.  Important Safety Information A possible side effect of Eliquis is bleeding. You should call your healthcare provider right away if you experience any of the following: ? Bleeding from an injury or your nose that does not stop. ? Unusual colored urine (red or dark brown) or unusual colored stools (red or black). ? Unusual bruising for unknown reasons. ? A serious fall or if you hit your head (even if there is no bleeding).  Some medicines may interact with  Eliquis and might increase your risk of bleeding or clotting while on Eliquis. To help avoid this, consult your healthcare provider or pharmacist prior to using any new prescription or non-prescription medications, including herbals, vitamins, non-steroidal anti-inflammatory drugs (NSAIDs) and supplements.  This website has more information on Eliquis (apixaban): http://www.eliquis.com/eliquis/home

## 2016-11-14 NOTE — Progress Notes (Signed)
Pt is tolerating pureed diet with nectar thick liquids. Persistent productive cough noted this afternoon, Dr notified. Started pt on Douneb resp tx every 6 hrs.

## 2016-11-14 NOTE — Clinical Social Work Placement (Signed)
   CLINICAL SOCIAL WORK PLACEMENT  NOTE  Date:  11/14/2016  Patient Details  Name: Stacie Wright MRN: [id number] Date of Birth: 1928-04-04  Clinical Social Work is seeking post-discharge placement for this patient at the Skilled  Nursing Facility level of care (*CSW will initial, date and re-position this form in  chart as items are completed):  Yes   Patient/family provided with [place] Clinical Social Work Department's list of facilities offering this level of care within the geographic area requested by the patient (or if unable, by the patient's family).  Yes   Patient/family informed of their freedom to choose among providers that offer the needed level of care, that participate in Medicare, Medicaid or managed care program needed by the patient, have an available bed and are willing to accept the patient.  Yes   Patient/family informed of 's ownership interest in [facility] and [facility], as well as of the fact that they are under no obligation to receive care at these facilities.  PASRR submitted to EDS on 11/13/16     PASRR number received on       Existing PASRR number confirmed on       FL2 transmitted to all facilities in geographic area requested by pt/family on 11/13/16     FL2 transmitted to all facilities within larger geographic area on       Patient informed that his/her managed care company has contracts with or will negotiate with certain facilities, including the following:            Patient/family informed of bed offers received.  Patient chooses bed at       Physician recommends and patient chooses bed at      Patient to be transferred to   on  .  Patient to be transferred to facility by       Patient family notified on   of transfer.  Name of family member notified:        PHYSICIAN       Additional Comment:    _______________________________________________ Dominic PeaJeneya G Saman Umstead, LCSW 11/14/2016, 5:50 PM

## 2016-11-14 NOTE — Clinical Social Work Note (Signed)
Clinical Social Work Assessment  Patient Details  Name: Stacie Wright MRN: [id number] Date of Birth: Mar 27, 1928  Date of referral:  11/14/16               Reason for consult:  Discharge Planning                Permission sought to share information with:    Permission granted to share information::     Name::        Agency::     Relationship::     Contact Information:     Housing/Transportation Living arrangements for the past 2 months:  Assisted Living Facility Source of Information:  Adult Children, Medical Team Patient Interpreter Needed:  None Criminal Activity/Legal Involvement Pertinent to Current Situation/Hospitalization:  No - Comment as needed Significant Relationships:  Adult Children Lives with:  Facility Resident Do you feel safe going back to the place where you live?  Yes Need for family participation in patient care:  Yes (Comment) (Pt has dementia)  Care giving concerns:  No caregiving concerns identified.    Social Worker assessment / plan:  CSW consulted for SNF placement for STR. CSW attempted to speak with pt yesterday. However pt sleeping soundly. CSW called son Harvie HeckRandy yesterday. CSW introduced self and explained CSW responsibilities. Son asked that pt be placed in a [place] SNF to be closer to family. [facility]. Son expressed that pt spouse is deceased. [facility] Care unit.   CSW completed Fl2 and faxed out. Pasrr completed and additional info faxed to [facility]. CSW will continue to follow.   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   Patient/Family's Understanding of and Emotional Response to Diagnosis, Current Treatment, and Prognosis:  Son understanding of need for STR  at a SNF before returning to ALF.  Emotional Assessment Appearance:  Appears stated age Attitude/Demeanor/Rapport:  Unable to Assess Affect (typically observed):  Unable to Assess Orientation:  Oriented to Self Alcohol / Substance use:  Other Psych involvement (Current and /or in the community):  No (Comment)  Discharge Needs  Concerns to be addressed:  Care Coordination Readmission within the last 30 days:  No Current discharge risk:  Cognitively Impaired Barriers to Discharge:  Continued Medical Work up   Dominic PeaJeneya G Eily Louvier, LCSW 11/14/2016, 5:41 PM

## 2016-11-14 NOTE — Progress Notes (Signed)
  Speech Language Pathology Treatment: Dysphagia  Patient Details Name: Scotty CourtDorothy H Scheiderer MRN: [id number] DOB: 1927/10/04 Today's Date: 11/14/2016 Time: 0936-1000 SLP Time Calculation (min) (ACUTE ONLY): 24 min  Assessment / Plan / Recommendation Clinical Impression  Noticeable reduction in congestion without baseline cough or congested breath sounds present prior to po trials. Mental status improved back to baseline compared with this SLP's initial assessment 2/27. Delayed cough with puree texture and immediate and delayed cough with nectar thick juice. Suspect pt is intermittently aspirating. Objective assessment may not result in a significant change of po recommendations and most likely not beneficial although can perform if desired by family. Spoke with resident who will discuss with family. Initiated Dys 1 (puree) and nectar thick liquids, crush meds. If goal is comfort, diet texture and liquid consistency could be upgraded to regular and thin.   HPI HPI: Pt is a 83 year old female with PMH of dementia and bronchiectasis with COPD and concern for MAI who presented from SNF with SOB and hypotension. Patient had 2 days of cough and increase WOB. Found to have a sat of 71% on RA on presentation. Patient is on BiPAP and has severe dementia and unable to provide further history. Son is HCPOA. CXR showed right upper lobe and bilateral lower lobe infiltrates noted consistent with pneumonia. Findings have progressed significantly from prior exam. Small right pleural effusion. Barium esophagram on 11/20/2014 showed diffuse age-related esophageal dysmotility. Patient unable to swallow a 12.5 mm barium tablet. Vallecular residuals. No prior ST notes.      SLP Plan  Continue with current plan of care       Recommendations  Diet recommendations: Dysphagia 1 (puree);Nectar-thick liquid Liquids provided via: Cup;No straw Medication Administration: Crushed with puree Supervision: Patient able to self  feed;Full supervision/cueing for compensatory strategies Compensations: Slow rate;Small sips/bites Postural Changes and/or Swallow Maneuvers: Seated upright 90 degrees                General recommendations:  (TBD) Oral Care Recommendations: Oral care BID Follow up Recommendations:  (TBD) SLP Visit Diagnosis: Dysphagia, unspecified (R13.10) Plan: Continue with current plan of care       GO                [name], Pairlee Sawtell Willis 11/14/2016, 12:04 PM  Breck CoonsLisa Willis Lonell FaceLitaker M.Ed [employer] Pager [phone]

## 2016-11-15 ENCOUNTER — Inpatient Hospital Stay (HOSPITAL_COMMUNITY): Payer: PPO

## 2016-11-15 LAB — CBC
HCT: 32.1 % — ABNORMAL LOW (ref 36.0–46.0)
Hemoglobin: 10.4 g/dL — ABNORMAL LOW (ref 12.0–15.0)
MCH: 32.1 pg (ref 26.0–34.0)
MCHC: 32.4 g/dL (ref 30.0–36.0)
MCV: 99.1 fL (ref 78.0–100.0)
PLATELETS: 130 10*3/uL — AB (ref 150–400)
RBC: 3.24 MIL/uL — AB (ref 3.87–5.11)
RDW: 14.1 % (ref 11.5–15.5)
WBC: 3.6 10*3/uL — AB (ref 4.0–10.5)

## 2016-11-15 LAB — BASIC METABOLIC PANEL
ANION GAP: 8 (ref 5–15)
BUN: 11 mg/dL (ref 6–20)
CALCIUM: 8 mg/dL — AB (ref 8.9–10.3)
CO2: 30 mmol/L (ref 22–32)
Chloride: 103 mmol/L (ref 101–111)
Creatinine, Ser: 0.8 mg/dL (ref 0.44–1.00)
GFR calc Af Amer: >60 mL/min (ref >60)
GFR calc non Af Amer: >60 mL/min (ref >60)
GLUCOSE: 102 mg/dL — AB (ref 65–99)
Potassium: 3 mmol/L — ABNORMAL LOW (ref 3.5–5.1)
SODIUM: 141 mmol/L (ref 135–145)

## 2016-11-15 LAB — CULTURE, BLOOD (ROUTINE X 2)
CULTURE: NO GROWTH
Culture: NO GROWTH

## 2016-11-15 LAB — MAGNESIUM: MAGNESIUM: 1.2 mg/dL — AB (ref 1.7–2.4)

## 2016-11-15 LAB — PHOSPHORUS: Phosphorus: 2.3 mg/dL — ABNORMAL LOW (ref 2.5–4.6)

## 2016-11-15 MED ORDER — IPRATROPIUM-ALBUTEROL 0.5-2.5 (3) MG/3ML IN SOLN
3.0000 mL | Freq: Three times a day (TID) | RESPIRATORY_TRACT | Status: DC
  Administered 2016-11-15 – 2016-11-16 (×4): 3 mL via RESPIRATORY_TRACT
  Filled 2016-11-15 (×4): qty 3

## 2016-11-15 MED ORDER — POTASSIUM CHLORIDE 20 MEQ/15ML (10%) PO SOLN
80.0000 meq | Freq: Once | ORAL | Status: AC
  Administered 2016-11-15: 80 meq via ORAL
  Filled 2016-11-15: qty 60

## 2016-11-15 MED ORDER — MAGNESIUM SULFATE 2 GM/50ML IV SOLN
2.0000 g | Freq: Once | INTRAVENOUS | Status: AC
  Administered 2016-11-15: 2 g via INTRAVENOUS
  Filled 2016-11-15 (×2): qty 50

## 2016-11-15 MED ORDER — PREDNISONE 50 MG PO TABS
50.0000 mg | ORAL_TABLET | Freq: Every day | ORAL | Status: DC
  Administered 2016-11-15 – 2016-11-17 (×3): 50 mg via ORAL
  Filled 2016-11-15 (×3): qty 1

## 2016-11-15 MED ORDER — POTASSIUM PHOSPHATES 15 MMOLE/5ML IV SOLN
20.0000 meq | Freq: Once | INTRAVENOUS | Status: AC
  Administered 2016-11-15: 20 meq via INTRAVENOUS
  Filled 2016-11-15 (×2): qty 4.55

## 2016-11-15 NOTE — Progress Notes (Signed)
Family Medicine Teaching Service Daily Progress Note Intern Pager: [phone]  Patient name: Stacie Wright Medical record number: [id number] Date of birth: Sep 10, 1928 Age: 83 y.o. Gender: female  Primary Care Provider: Wende Neighbors, MD Consultants: CCM Code Status: DO NOT RESUSCITATE   Pt Overview and Major Events to Date:  2/26 Admit to FPTS   Assessment and Plan:  Stacie Wright is a 83 y.o. female presenting with respiratory failure. PMH is significant for COPD, MAC, bronchiectasis, DVT x 2, and dementia.   Acute hypoxic respiratory failure with severe sepsis, improving:  COPD and recent completed abx course for MAC infection. Improving. CXR with increasing density in RUL. Has remained afebrile with VSS. Sepsis resolved. Continues to have wheezing and hypoxia.  - Day 6 Abx ceftriaxone,  Bcx NG2D. Total of 7 days antibiotics.  - SLP recommended nectar thick diet with full supervision  - Will discuss goals of care with family and see if they wish palliative care involvement/ comfort feeds  - Currently on 3L [place], plan to wean once appearing better  - PT/OT consult - recc SNF.  SW to find placement and FL2 form signed.  - Vitals per floor protocol  -duonebs TID, and albuteral q2hrs prn -repeat CXR  Worsening pulmonary edema 3/1 with increased work of breathing and congestion.  Stat CXR ordered which revealed worsening pulm edema.  Fluids stopped and ordered IV Lasix 40 mg x1 and reassessed for fluid and respiratory status later in day.  Appeared to be doing much better with no leg swelling, shortness of breath or increased work of breathing.  Did not give further Lasix.   -f/u AM CXR -may need continued diuresis  -daily weights  Influenza - Flu A positive  - Continue Tamiflu (2/27 -); last day today   Hypokalemia K+ 3.0 this morning.  -check magnesium and phos -replete -repeat BMP in AM  COPD: Taking brovana daily. S/p solumedrol in ED. Troponin 0.33>0.31>0.30 likely demand  ischemia.  Significant congestion and secretions this AM.   - continue brovana - consider scheduled duonebs and prn albuteral - prn tessalon - would consider a steroid burst since respiratory status not improving much  History of DVT: Multiple DVTs. Continue eliquis 2.5 mg BID.   AKI, improved - Cr baseline ~0.88, .8 this AM.       Hypertension: Takes lopressor 29m daily . Normotensive.  - hold home medication w/presentation of hypotension  -add back meds as needed  GAD: Takes paroxetine 264mdaily and xanax 0.55m42m6hrs PRN anxiety at home  - Will reduce Xanax dose to 0.25 mg q6hrs prn in setting of tenuous respiratory status - consider weaning off Xanax  - continue home paroxetine 20 mg Qd   Alzheimers dementia: Resides at memory unit. Taking resperidol 1mg23mily and paxil 20mg31mly and xanax 0.55mg q69ms PRN anxiety. - continue home regimen but with reduced dose of xanax in setting of respiratory distress  FEN/GI: SLIV /protonix Prophylaxis: eliquis  Disposition: dispo pending clinical improvement   Subjective:  Patient ill appearing but comfortable. No increased work of breathing. Expressing depressive symptoms.   Objective: Temp:  [98.5 F (36.9 C)-99.3 F (37.4 C)] 98.6 F (37 C) (03/03 0628) Pulse Rate:  [79-93] 93 (03/03 0628) Resp:  [16] 16 (03/03 0628) BP: (131-142)/(65-66) 142/65 (03/03 0628) SpO2:  [92 %-95 %] 95 % (03/03 0628) 2458ysical Exam General: NAD, resting comfortably in bed, alert and interactive Cardiovascular: RRR, no murmurs rubs or gallops Respiratory: +diffuse rhonchi and  wheezes throughout all lung fields, increased secretions with congestion, comfortable work of breathing on 3L O2 by [place] Abdomen: Soft, nontender, nondistended, no hepatosplenomegaly Extremities: Moves 4 extremities equally, no lower extremity edema noted  Laboratory:  Recent Labs Lab 11/12/16 0430 11/14/16 0525 11/15/16 0602  WBC 7.3 4.2 3.6*  HGB 10.3* 10.4* 10.4*   HCT 32.2* 32.4* 32.1*  PLT 106* 124* 130*    Recent Labs Lab 11/10/16 0846  11/11/16 0338 11/12/16 0430 11/14/16 0525 11/15/16 0602  NA 136  < > 140 137 140 141  K 5.6*  < > 4.1 4.0 3.0* 3.0*  CL 101  < > 106 106 100* 103  CO2 22  < > _0 BUN 29*  < > 25* _1 CREATININE 1.61*  < > 1.27* 0.91 1.00 0.80  CALCIUM 8.7*  < > 8.5* 7.9* 7.8* 8.0*  PROT 6.3*  --  6.1*  --   --   --   BILITOT 1.6*  --  1.0  --   --   --   ALKPHOS 33*  --  35*  --   --   --   ALT 10*  --  25  --   --   --   AST 45*  --  49*  --   --   --   GLUCOSE 159*  < > 92 87 83 102*  < > = values in this interval not displayed.  Imaging/Diagnostic Tests: Dg Chest Port 1 View 11/11/2016 FINDINGS: Cardiomegaly with normal pulmonary vascularity. Right upper lobe and bilateral lower lobe pulmonary infiltrates. Interim significant progression from prior exam. Small right pleural effusion. No pneumothorax.  IMPRESSION: Right upper lobe and bilateral lower lobe infiltrates noted consistent with pneumonia. Findings have progressed significantly from prior exam. Small right pleural effusion Electronically Signed   By: Marcello Moores  Register   On: 11/11/2016 07:35   Dg Chest Portable 1 View 11/10/2016 FINDINGS: Patchy airspace disease noted in the right upper lobe and both lung bases. This is similar to prior study and prior CT in the lung bases, likely related to bronchiectasis seen on prior CT. This is increased slightly in the right upper lobe. Heart is normal size. No effusions or acute bony abnormality.  IMPRESSION: Bronchiectatic changes in the lung bases. Increasing density in the right upper lobe. Cannot exclude superimposed pneumonia.    Katheren Shams, DO 11/15/2016, 8:34 AM PGY-3, [place] Intern pager: [phone], text pages welcome

## 2016-11-15 NOTE — Progress Notes (Signed)
[facility]  States that she does not have any pain.  Patient enjoys staff in room having conversations with her.  States that she does not have appetite for breakfast.  Productive cough.

## 2016-11-16 LAB — RENAL FUNCTION PANEL
Albumin: 2.3 g/dL — ABNORMAL LOW (ref 3.5–5.0)
Anion gap: 7 (ref 5–15)
BUN: 9 mg/dL (ref 6–20)
CALCIUM: 8.1 mg/dL — AB (ref 8.9–10.3)
CO2: 33 mmol/L — AB (ref 22–32)
CREATININE: 0.75 mg/dL (ref 0.44–1.00)
Chloride: 101 mmol/L (ref 101–111)
Glucose, Bld: 164 mg/dL — ABNORMAL HIGH (ref 65–99)
Phosphorus: 3.2 mg/dL (ref 2.5–4.6)
Potassium: 3.6 mmol/L (ref 3.5–5.1)
SODIUM: 141 mmol/L (ref 135–145)

## 2016-11-16 LAB — URINALYSIS, ROUTINE W REFLEX MICROSCOPIC
BACTERIA UA: NONE SEEN
BILIRUBIN URINE: NEGATIVE
Glucose, UA: NEGATIVE mg/dL
HGB URINE DIPSTICK: NEGATIVE
Ketones, ur: NEGATIVE mg/dL
LEUKOCYTES UA: NEGATIVE
NITRITE: NEGATIVE
PROTEIN: 30 mg/dL — AB
Specific Gravity, Urine: 1.016 (ref 1.005–1.030)
pH: 6 (ref 5.0–8.0)

## 2016-11-16 LAB — MAGNESIUM: Magnesium: 1.8 mg/dL (ref 1.7–2.4)

## 2016-11-16 MED ORDER — LORAZEPAM 2 MG/ML IJ SOLN
0.5000 mg | Freq: Once | INTRAMUSCULAR | Status: AC
  Administered 2016-11-16: 0.5 mg via INTRAVENOUS
  Filled 2016-11-16: qty 1

## 2016-11-16 MED ORDER — LEVOFLOXACIN 750 MG PO TABS
750.0000 mg | ORAL_TABLET | Freq: Every day | ORAL | Status: DC
  Administered 2016-11-16: 750 mg via ORAL
  Filled 2016-11-16: qty 1

## 2016-11-16 MED ORDER — IPRATROPIUM-ALBUTEROL 0.5-2.5 (3) MG/3ML IN SOLN: 3.0000 mL | RESPIRATORY_TRACT | Status: DC | PRN

## 2016-11-16 NOTE — Progress Notes (Signed)
Patient assisted to telephone son, Harvie HeckRandy.

## 2016-11-16 NOTE — Plan of Care (Signed)
Problem: Physical Regulation: Goal: Ability to maintain clinical measurements within normal limits will improve Outcome: Not Progressing Patient becoming agitated. Assisted up to bsc several times with no results. Bladder scan showed 233 ml. Notified MD. Order to do in and out catheter. 300 ml clear yellow urine, however, pt increasingly agitated and c/o buring. MD notified. UA sent. Will continue to monitor. Bess KindsGWALTNEY, Megyn Leng B, RN

## 2016-11-16 NOTE — Progress Notes (Signed)
Received transferred call to bedside telephone from patient's son, Janene HarveyRandy Demore. Update given on patient's current status this assessment. Patient communicated over telephone with son. Patient's son request f/u from attending MD re: d/c plan Janene Harvey(Randy Awtrey: [phone]).

## 2016-11-16 NOTE — Progress Notes (Signed)
Attempted to contact son at the phone number listed in previous note, to update him of discharge plan as per his request.   Unable to reach him both times.  Patient is doing much better and work of breathing has improved.  Plan for patient is to return to SNF hopefully tomorrow.

## 2016-11-16 NOTE — Progress Notes (Signed)
Patient asleep, resting comfortably. Respirations even and unlabored, no acute distress noted.

## 2016-11-16 NOTE — Progress Notes (Signed)
Family Medicine Teaching Service Daily Progress Note Intern Pager: [phone]  Patient name: Stacie Wright Medical record number: [id number] Date of birth: [birth year] Age: 83 y.o. Gender: female  Primary Care Provider: Wende Neighbors, MD Consultants: CCM Code Status: DO NOT RESUSCITATE   Pt Overview and Major Events to Date:  2/26 Admit to FPTS   Assessment and Plan:  Stacie Wright is a 83 y.o. female presenting with respiratory failure. PMH is significant for COPD, MAC, bronchiectasis, DVT x 2, and dementia.   Acute hypoxic respiratory failure with severe sepsis, improving:  COPD and recent completed abx course for MAC infection.  CXR with increasing density in RUL. Has remained afebrile with VSS. Sepsis resolved.  SLP seen and recommended nectar thick diet.  - Day 6 Abx.  Will transition from CTX to Levaquin for total of 10 days on antibiotics. BCx NG2D.  Repeat CXR appears to be returning to baseline with improved respiratory status.   - Currently on 3L [place], plan to wean once appearing better but may be a new o2 requirement for her.  -steroid burst on 3/3 and breathing improved with minimal wheezing noted on lung exam. Continue steroid burst x total of 5 days - PT/OT consult - recc SNF.  SW to find placement and FL2 form signed.  -duonebs TID, and albuteral q2hrs prn - Vitals per floor protocol   Worsening pulmonary edema, improved  3/1 with increased work of breathing and congestion.  Stat CXR ordered which revealed worsening pulm edema.  Fluids stopped and ordered IV Lasix 40 mg x1 and reassessed for fluid and respiratory status later in day.  Did not require further Lasix. Appears to be doing much better with no leg swelling, shortness of breath or increased work of breathing.  -Monitor need for diuresis  -daily weights  Influenza - Flu A positive  - Tamiflu course complete   Hypokalemia K+ 3.6 this morning.  Mag 1.8.  -repeat BMP in AM  COPD: Taking brovana daily. S/p  solumedrol in ED. Troponin 0.33>0.31>0.30 likely demand ischemia.  Congestion and secretions improved this AM.  - continue brovana - consider scheduled duonebs and prn albuterol - prn tessalon - Steroid burst started 3/3 with improvement.  Will continue.   History of DVT: Multiple DVTs. Continue Eliquis 2.5 mg BID.   AKI, improved - Cr baseline ~0.88>0.8>0.75  this AM.       Hypertension: Takes lopressor 57m daily . Normotensive.  - hold home medication w/presentation of hypotension  -add back meds as needed  GAD: Takes paroxetine 244mdaily and xanax 0.73m52m6hrs PRN anxiety at home  - Will reduce Xanax dose to 0.25 mg q6hrs prn in setting of tenuous respiratory status.  - continue home paroxetine 20 mg Qd   Alzheimers dementia: Resides at memory unit. Taking Risperidol 1mg34mily and paxil 20mg73mly and Xanax 0.73mg q20ms PRN anxiety.  - continue home regimen but with reduced dose of xanax in setting of respiratory distress  FEN/GI: SLIV /protonix  Prophylaxis: eliquis  Disposition: awaiting SNF placement   Subjective:  Patient ill appearing but comfortable. No increased work of breathing.   Objective: Temp:  [97.6 F (36.4 C)-99.4 F (37.4 C)] 97.6 F (36.4 C) (03/03 2252) Pulse Rate:  [86-94] 94 (03/03 2252) Resp:  [16-17] 17 (03/03 2252) BP: (153-166)/(61-69) 166/69 (03/03 2252) SpO2:  [92 %-94 %] 92 % (03/03 2252)   Physical Exam General: NAD, resting comfortably in bed, alert and interactive Cardiovascular: RRR, no murmurs rubs or  gallops Respiratory:  Mild expiratory wheezes bilaterally, comfortable work of breathing on 3L O2 by  Abdomen: Soft, NTND, +bs Extremities: Moves 4 extremities equally, no lower extremity edema noted  Laboratory:  Recent Labs Lab 11/12/16 0430 11/14/16 0525 11/15/16 0602  WBC 7.3 4.2 3.6*  HGB 10.3* 10.4* 10.4*  HCT 32.2* 32.4* 32.1*  PLT 106* 124* 130*    Recent Labs Lab 11/10/16 0846  11/11/16 0338  11/14/16 0525  11/15/16 0602 11/16/16 0447  NA 136  < > 140  < > 140 141 141  K 5.6*  < > 4.1  < > 3.0* 3.0* 3.6  CL 101  < > 106  < > 100* 103 101  CO2 22  < > 25  < > 29 30 33*  BUN 29*  < > 25*  < > _0 CREATININE 1.61*  < > 1.27*  < > 1.00 0.80 0.75  CALCIUM 8.7*  < > 8.5*  < > 7.8* 8.0* 8.1*  PROT 6.3*  --  6.1*  --   --   --   --   BILITOT 1.6*  --  1.0  --   --   --   --   ALKPHOS 33*  --  35*  --   --   --   --   ALT 10*  --  25  --   --   --   --   AST 45*  --  49*  --   --   --   --   GLUCOSE 159*  < > 92  < > 83 102* 164*  < > = values in this interval not displayed.  Imaging/Diagnostic Tests: Dg Chest Port 1 View 11/11/2016 FINDINGS: Cardiomegaly with normal pulmonary vascularity. Right upper lobe and bilateral lower lobe pulmonary infiltrates. Interim significant progression from prior exam. Small right pleural effusion. No pneumothorax.  IMPRESSION: Right upper lobe and bilateral lower lobe infiltrates noted consistent with pneumonia. Findings have progressed significantly from prior exam. Small right pleural effusion Electronically Signed   By: Marcello Moores  Register   On: 11/11/2016 07:35   Dg Chest Portable 1 View 11/10/2016 FINDINGS: Patchy airspace disease noted in the right upper lobe and both lung bases. This is similar to prior study and prior CT in the lung bases, likely related to bronchiectasis seen on prior CT. This is increased slightly in the right upper lobe. Heart is normal size. No effusions or acute bony abnormality.  IMPRESSION: Bronchiectatic changes in the lung bases. Increasing density in the right upper lobe. Cannot exclude superimposed pneumonia.    Lovenia Kim, MD 11/16/2016, 12:57 PM PGY-1, [place] Intern pager: [phone], text pages welcome

## 2016-11-17 DIAGNOSIS — E876 Hypokalemia: Secondary | ICD-10-CM | POA: Diagnosis not present

## 2016-11-17 DIAGNOSIS — J449 Chronic obstructive pulmonary disease, unspecified: Secondary | ICD-10-CM | POA: Diagnosis not present

## 2016-11-17 DIAGNOSIS — I82409 Acute embolism and thrombosis of unspecified deep veins of unspecified lower extremity: Secondary | ICD-10-CM | POA: Diagnosis not present

## 2016-11-17 DIAGNOSIS — F411 Generalized anxiety disorder: Secondary | ICD-10-CM | POA: Diagnosis not present

## 2016-11-17 DIAGNOSIS — I82502 Chronic embolism and thrombosis of unspecified deep veins of left lower extremity: Secondary | ICD-10-CM | POA: Diagnosis not present

## 2016-11-17 DIAGNOSIS — J479 Bronchiectasis, uncomplicated: Secondary | ICD-10-CM | POA: Diagnosis not present

## 2016-11-17 DIAGNOSIS — I10 Essential (primary) hypertension: Secondary | ICD-10-CM | POA: Diagnosis not present

## 2016-11-17 DIAGNOSIS — J9600 Acute respiratory failure, unspecified whether with hypoxia or hypercapnia: Secondary | ICD-10-CM | POA: Diagnosis not present

## 2016-11-17 DIAGNOSIS — J189 Pneumonia, unspecified organism: Secondary | ICD-10-CM | POA: Diagnosis not present

## 2016-11-17 DIAGNOSIS — A419 Sepsis, unspecified organism: Secondary | ICD-10-CM | POA: Diagnosis not present

## 2016-11-17 DIAGNOSIS — J1100 Influenza due to unidentified influenza virus with unspecified type of pneumonia: Secondary | ICD-10-CM | POA: Diagnosis not present

## 2016-11-17 DIAGNOSIS — G309 Alzheimer's disease, unspecified: Secondary | ICD-10-CM | POA: Diagnosis not present

## 2016-11-17 DIAGNOSIS — R531 Weakness: Secondary | ICD-10-CM | POA: Diagnosis not present

## 2016-11-17 DIAGNOSIS — F0280 Dementia in other diseases classified elsewhere without behavioral disturbance: Secondary | ICD-10-CM | POA: Diagnosis not present

## 2016-11-17 DIAGNOSIS — M109 Gout, unspecified: Secondary | ICD-10-CM | POA: Diagnosis not present

## 2016-11-17 DIAGNOSIS — R1312 Dysphagia, oropharyngeal phase: Secondary | ICD-10-CM | POA: Diagnosis not present

## 2016-11-17 DIAGNOSIS — R488 Other symbolic dysfunctions: Secondary | ICD-10-CM | POA: Diagnosis not present

## 2016-11-17 DIAGNOSIS — Z7901 Long term (current) use of anticoagulants: Secondary | ICD-10-CM | POA: Diagnosis not present

## 2016-11-17 DIAGNOSIS — M6281 Muscle weakness (generalized): Secondary | ICD-10-CM | POA: Diagnosis not present

## 2016-11-17 LAB — BASIC METABOLIC PANEL
ANION GAP: 8 (ref 5–15)
BUN: 13 mg/dL (ref 6–20)
CALCIUM: 8.4 mg/dL — AB (ref 8.9–10.3)
CO2: 33 mmol/L — AB (ref 22–32)
Chloride: 99 mmol/L — ABNORMAL LOW (ref 101–111)
Creatinine, Ser: 0.85 mg/dL (ref 0.44–1.00)
Glucose, Bld: 132 mg/dL — ABNORMAL HIGH (ref 65–99)
Potassium: 3.2 mmol/L — ABNORMAL LOW (ref 3.5–5.1)
Sodium: 140 mmol/L (ref 135–145)

## 2016-11-17 MED ORDER — POTASSIUM CHLORIDE 20 MEQ/15ML (10%) PO SOLN
40.0000 meq | Freq: Once | ORAL | Status: AC
  Administered 2016-11-17: 40 meq via ORAL
  Filled 2016-11-17: qty 30

## 2016-11-17 MED ORDER — METOPROLOL TARTRATE 12.5 MG HALF TABLET
12.5000 mg | ORAL_TABLET | Freq: Two times a day (BID) | ORAL | Status: DC
  Administered 2016-11-17: 12.5 mg via ORAL
  Filled 2016-11-17: qty 1

## 2016-11-17 MED ORDER — LEVOFLOXACIN 750 MG PO TABS: 750.0000 mg | ORAL_TABLET | ORAL | 0 refills | Status: DC

## 2016-11-17 MED ORDER — LEVOFLOXACIN 750 MG PO TABS: 750.0000 mg | ORAL_TABLET | ORAL | Status: DC

## 2016-11-17 MED ORDER — PREDNISONE 50 MG PO TABS: ORAL_TABLET | ORAL | 0 refills | Status: DC

## 2016-11-17 NOTE — Progress Notes (Addendum)
[facility]. [facility] Patient stable at this time. Will continue to monitor.  [facility]. Pt stable at time of discharge.

## 2016-11-17 NOTE — Progress Notes (Signed)
Physical Therapy Treatment Patient Details Name: Stacie CourtDorothy H Wright MRN: [id number] DOB: 1927/10/08 Today's Date: 11/17/2016    History of Present Illness Pt adm with respiratory failure, sepsis, and PNA. PMH - dementia, COPD, respiratory failure    PT Comments    Pt admitted with above diagnosis. Pt currently with functional limitations due to the deficits listed below (see PT Problem List). Pt was able to ambulate with RW with min to min guard assist.  Pt needs cues for safety. Self limitations per pt as she requires max encouragment to just get OOB.   Pt will benefit from skilled PT to increase their independence and safety with mobility to allow discharge to the venue listed below.     Follow Up Recommendations  SNF (unless ALF able to provide physical assist for all mobility)     Equipment Recommendations  Other (comment) (to be assessed)    Recommendations for Other Services Other (comment) (Palliative care)     Precautions / Restrictions Precautions Precautions: Fall Restrictions Weight Bearing Restrictions: No    Mobility  Bed Mobility Overal bed mobility: Needs Assistance Bed Mobility: Supine to Sit     Supine to sit: Min assist     General bed mobility comments: assist to lift LEs off bed and elevate trunk.  Transfers Overall transfer level: Needs assistance Equipment used: Rolling walker (2 wheeled) Transfers: Sit to/from Stand Sit to Stand: Min assist         General transfer comment: assist to lift hips and for balance.  Step by step instruction and cues for handplacement.  Ambulation/Gait Ambulation/Gait assistance: Min guard;Min assist Ambulation Distance (Feet): 35 Feet Assistive device: Rolling walker (2 wheeled) Gait Pattern/deviations: Step-through pattern;Decreased step length - right;Decreased step length - left;Shuffle;Trunk flexed Gait velocity: decr Gait velocity interpretation: <1.8 ft/sec, indicative of risk for recurrent falls General  Gait Details: Assist for balance and support. Pt amb on 2L of O2 with SpO2 to 86%.  Cues for pursed lip breathing.  Pt self limiting increasing distance only by 10 feet.  Kept asking to go back to bed.  Pt agreed to sit up in chair and nursing came in to assist pt with meal.  Pt stated she had to use potty but assisted pt to potty and she did not go.    Stairs            Wheelchair Mobility    Modified Rankin (Stroke Patients Only)       Balance Overall balance assessment: Needs assistance Sitting-balance support: No upper extremity supported;Feet supported Sitting balance-Leahy Scale: Fair     Standing balance support: Bilateral upper extremity supported;During functional activity Standing balance-Leahy Scale: Poor Standing balance comment: walker and min A for static standing                    Cognition Arousal/Alertness: Awake/alert Behavior During Therapy: Anxious Overall Cognitive Status: No family/caregiver present to determine baseline cognitive functioning                 General Comments: Pt with h/o dementia     Exercises      General Comments        Pertinent Vitals/Pain Pain Assessment: No/denies pain  O2 86% on 2LO2 after activity.  O2 incr to 91% and > on 2LO2 with rest.   Home Living                      Prior Function  PT Goals (current goals can now be found in the care plan section) Progress towards PT goals: Progressing toward goals    Frequency    Min 3X/week      PT Plan Current plan remains appropriate    Co-evaluation             End of Session Equipment Utilized During Treatment: Gait belt;Oxygen Activity Tolerance: Patient limited by fatigue Patient left: in chair;with call bell/phone within reach;with chair alarm set Nurse Communication: Mobility status PT Visit Diagnosis: Unsteadiness on feet (R26.81);Muscle weakness (generalized) (M62.81)     Time: 1610-9604 PT Time  Calculation (min) (ACUTE ONLY): 18 min  Charges:  $Gait Training: 8-22 mins                    G Codes:       Berline Lopes 2016/11/21, 9:46 AM [facility] [phone] (pager)

## 2016-11-17 NOTE — Clinical Social Work Placement (Addendum)
   CLINICAL SOCIAL WORK PLACEMENT  NOTE  Date:  11/17/2016  Patient Details  Name: Stacie Wright MRN: [id number] Date of Birth: 10-19-1927  Clinical Social Work is seeking post-discharge placement for this patient at the Skilled  Nursing Facility level of care (*CSW will initial, date and re-position this form in  chart as items are completed):  Yes   Patient/family provided with [place] Clinical Social Work Department's list of facilities offering this level of care within the geographic area requested by the patient (or if unable, by the patient's family).  Yes   Patient/family informed of their freedom to choose among providers that offer the needed level of care, that participate in Medicare, Medicaid or managed care program needed by the patient, have an available bed and are willing to accept the patient.  Yes   Patient/family informed of [facility]'s ownership interest in [facility] and [facility], as well as of the fact that they are under no obligation to receive care at these facilities.  PASRR submitted to EDS on 11/13/16     PASRR number received on 11/17/16     Existing PASRR number confirmed on       FL2 transmitted to all facilities in geographic area requested by pt/family on 11/13/16     FL2 transmitted to all facilities within larger geographic area on 11/13/16     Patient informed that his/her managed care company has contracts with or will negotiate with certain facilities, including the following:        Yes   Patient/family informed of bed offers received.  [facility]     Physician recommends and patient chooses bed at      [facility] on 11/17/16.  Patient to be transferred to facility by [facility]     Patient family notified on 11/17/16 of transfer.  Name of family member notified:  Stacie Wright     PHYSICIAN       Additional Comment:  Pt is ready for  discharge today and will [facility]. Pt has dementia and is oriented only to self. Son is aware and agreeable to discharge plan. [facility] and [facility] for room and report. Insurance auth confirmation #: [id number] provided to [facility]. Son to sign paperwork at 4:30pm, per [facility]. PASRR #: [id number] E provided to [facility], as well. Room and report provided to RN and put in treatment team sticky note. Transportation arranged with [facility] for pick up. CSW is signing off as no further needs identified.  _______________________________________________ Dominic PeaJeneya G Stamatia Masri, LCSW 11/17/2016, 3:30 PM

## 2016-11-17 NOTE — Progress Notes (Signed)
PHARMACY NOTE:  ANTIMICROBIAL RENAL DOSAGE ADJUSTMENT  Current antimicrobial regimen includes a mismatch between antimicrobial dosage and estimated renal function.  As per policy approved by the Pharmacy & Therapeutics and Medical Executive Committees, the antimicrobial dosage will be adjusted accordingly.  Current antimicrobial dosage:  Levofloxacin 750mg  daily  Indication: HCAP  Renal Function: SCr 0.85, CrCl 48  Estimated Creatinine Clearance: 47.9 mL/min (by C-G formula based on SCr of 0.85 mg/dL).    Antimicrobial dosage has been changed to:  Levofloxacin 750mg  Q48H for total of ten days of antibiotics with end date 3/7  Thank you for allowing pharmacy to be a part of this patient's care.  Gwyndolyn KaufmanKai Carita Sollars Bernette Redbird(Kenny), PharmD  PGY1 Pharmacy Resident Pager: [phone] 11/17/2016 9:26 AM

## 2016-11-18 ENCOUNTER — Non-Acute Institutional Stay (SKILLED_NURSING_FACILITY): Payer: PPO | Admitting: Internal Medicine

## 2016-11-18 ENCOUNTER — Encounter: Payer: Self-pay | Admitting: Internal Medicine

## 2016-11-18 DIAGNOSIS — I10 Essential (primary) hypertension: Secondary | ICD-10-CM | POA: Diagnosis not present

## 2016-11-18 DIAGNOSIS — I82409 Acute embolism and thrombosis of unspecified deep veins of unspecified lower extremity: Secondary | ICD-10-CM | POA: Diagnosis not present

## 2016-11-18 DIAGNOSIS — G309 Alzheimer's disease, unspecified: Secondary | ICD-10-CM

## 2016-11-18 DIAGNOSIS — J189 Pneumonia, unspecified organism: Secondary | ICD-10-CM | POA: Diagnosis not present

## 2016-11-18 DIAGNOSIS — F0280 Dementia in other diseases classified elsewhere without behavioral disturbance: Secondary | ICD-10-CM | POA: Diagnosis not present

## 2016-11-18 LAB — PROTIME-INR: Protime: 16.4 seconds — AB (ref 10.0–13.8)

## 2016-11-18 LAB — POCT INR: INR: 1.3 — AB (ref 0.9–1.1)

## 2016-11-18 NOTE — Progress Notes (Signed)
Facility Location: [facility]  Room Number: 311  Code Status: DNR   PCP: Dwana MelenaZack Hall, MD [address] [place] [id number]   This is a comprehensive admission note to [facility] performed on this date less than 30 days from date of admission. Included are preadmission medical/surgical history;reconciled medication list; family history; social history and comprehensive review of systems.  Corrections and additions to the records were documented . Comprehensive physical exam was also performed. Additionally a clinical summary was entered for each active diagnosis pertinent to this admission in the Problem List to enhance continuity of care.  HPI: The patient was hospitalized 2/26-11/17/16 with healthcare associated pneumonia of the right lung. This was in the context of underlying bronchiectasis and atypical mycobacterial lung infection. At the time of admission she was felt to have severe sepsis due to the pneumonia and associated influenza A. Initially she received vancomycin and cefepime. Based on culture results mitomycin was discontinued and she was transitioned to Levaquin for a total of 10 days. Blood cultures revealed no growth. She did receive Tamiflu for influenza A. Excessive pulmonary secretions were an issue and she required 3 L of oxygen by nasal cannula. Swallowing evaluation could not be completed due to her tenuous respiratory status and the secretions, she was placed on nectar thick diet. Course was complicated by pulmonary edema for which she was diuresed. She received a large burst dose of steroids which was to be continued for a total of 5 days. She also had mild hypokalemia for which supplementation was pursued. She has a chronic stable anemia as well as variable thrombocytopenia.  Past medical and surgical history: Includes COPD, hypertension, anxiety/depression, and dementia. She has history of gout and is on allopurinol.  She's had a history of deep venous thrombosis on 2 occasions. Significant surgeries include cholecystectomy and hysterectomy.  Social history: Reviewed  Family history: Reviewed  Review of systems:Could not be completed due to dementia. She is unable to give me the date or name the president. She states that she must get home to be "with my dad and mommy" (note the patient's 81 years old). She specifically denies any pain or other  active symptoms. Constitutional: No fever,significant weight change, fatigue  Eyes: No redness, discharge, pain, vision change ENT/mouth: No nasal congestion,  purulent discharge, earache,change in hearing ,sore throat  Cardiovascular: No chest pain, palpitations,paroxysmal nocturnal dyspnea, claudication, edema  Respiratory: No cough, sputum production,hemoptysis, DOE , significant snoring,apnea  Gastrointestinal: No heartburn,dysphagia,abdominal pain, nausea / vomiting,rectal bleeding, melena,change in bowels Genitourinary: No dysuria,hematuria, pyuria,  incontinence, nocturia Musculoskeletal: No joint stiffness, joint swelling, weakness,pain Dermatologic: No rash, pruritus, change in appearance of skin Neurologic: No dizziness,headache,syncope, seizures, numbness , tingling Psychiatric: No significant anxiety , depression, insomnia, anorexia Endocrine: No change in hair/skin/ nails, excessive thirst, excessive hunger, excessive urination  Hematologic/lymphatic: No significant bruising, lymphadenopathy,abnormal bleeding Allergy/immunology: No itchy/ watery eyes, significant sneezing, urticaria, angioedema  Physical exam:  Pertinent or positive findings: Dentition is poor with multiple fractured and missing teeth. She has low-grade coarse rhonchi which are somewhat asymmetric. She has trace edema at the sock line. The posterior tibial pulses are decreased. She is intermittently very agitated, demanding to go home.  General appearance:Adequately nourished; no  acute distress , increased work of breathing is present.   Lymphatic: No lymphadenopathy about the head, neck, axilla . Eyes: No conjunctival inflammation or lid edema is present. There is no scleral icterus. Ears:  External ear exam shows  no significant lesions or deformities.   Nose:  External nasal examination shows no deformity or inflammation. Nasal mucosa are pink and moist without lesions ,exudates Oral exam: lips and gums are healthy appearing.There is no oropharyngeal erythema or exudate . Neck:  No thyromegaly, masses, tenderness noted.    Heart:  Normal rate and regular rhythm. S1 and S2 normal without gallop, murmur, click, rub .  Abdomen:Bowel sounds are normal. Abdomen is soft and nontender with no organomegaly, hernias,masses. GU: deferred as previously addressed. Extremities:  No cyanosis, clubbing Neurologic exam : Balance,Rhomberg,finger to nose testing could not be completed due to clinical stateSkin: Warm & dry w/o tenting. No significant lesions or rash.  See clinical summary under each active problem in the Problem List with associated updated therapeutic plan

## 2016-11-18 NOTE — Assessment & Plan Note (Signed)
Psychiatry consult is indicated due to her agitation and confusion Potential contribution of polypharmacy will be assessed

## 2016-11-18 NOTE — Assessment & Plan Note (Signed)
Blood pressure is elevated but she is extremely agitated; the pressure will be monitored

## 2016-11-18 NOTE — Assessment & Plan Note (Signed)
11/18/16 Elliquis 10 mg twice a day prophylactically would cost $414.69 for 14 days. While in the SNF she should be switched to warfarin, dose based on PT/INRs

## 2016-11-18 NOTE — Assessment & Plan Note (Signed)
Course of Levaquin is being completed. She will continue on her medications for Mycobacterium avium intracelluare and associated bronchiectasis While at the SNF Brovana will be changed to DuoNeb nbulized treatments due to cost

## 2016-11-18 NOTE — Patient Instructions (Signed)
See assessment and plan under each diagnosis in the problem list and acutely for this visit 

## 2016-11-20 ENCOUNTER — Encounter: Payer: Self-pay | Admitting: Internal Medicine

## 2016-11-20 DIAGNOSIS — Z8739 Personal history of other diseases of the musculoskeletal system and connective tissue: Secondary | ICD-10-CM | POA: Insufficient documentation

## 2016-11-21 LAB — POCT INR: INR: 1.2 — AB (ref 0.9–1.1)

## 2016-11-21 LAB — PROTIME-INR: Protime: 14.8 seconds — AB (ref 10.0–13.8)

## 2016-11-26 DIAGNOSIS — F339 Major depressive disorder, recurrent, unspecified: Secondary | ICD-10-CM | POA: Diagnosis not present

## 2016-11-26 DIAGNOSIS — G309 Alzheimer's disease, unspecified: Secondary | ICD-10-CM | POA: Diagnosis not present

## 2016-11-26 DIAGNOSIS — F0280 Dementia in other diseases classified elsewhere without behavioral disturbance: Secondary | ICD-10-CM | POA: Diagnosis not present

## 2016-11-26 DIAGNOSIS — F39 Unspecified mood [affective] disorder: Secondary | ICD-10-CM | POA: Diagnosis not present

## 2016-11-27 ENCOUNTER — Encounter: Payer: Self-pay | Admitting: Internal Medicine

## 2016-11-27 ENCOUNTER — Inpatient Hospital Stay (HOSPITAL_COMMUNITY)
Admission: EM | Admit: 2016-11-27 | Discharge: 2016-12-03 | DRG: 689 | Disposition: A | Discharge status: Skilled Nursing Facility | Payer: PPO | Attending: Internal Medicine | Admitting: Internal Medicine

## 2016-11-27 ENCOUNTER — Non-Acute Institutional Stay (SKILLED_NURSING_FACILITY): Payer: PPO | Admitting: Internal Medicine

## 2016-11-27 ENCOUNTER — Emergency Department (HOSPITAL_COMMUNITY): Payer: PPO

## 2016-11-27 ENCOUNTER — Encounter (HOSPITAL_COMMUNITY): Payer: Self-pay | Admitting: Family Medicine

## 2016-11-27 DIAGNOSIS — I82409 Acute embolism and thrombosis of unspecified deep veins of unspecified lower extremity: Secondary | ICD-10-CM | POA: Diagnosis present

## 2016-11-27 DIAGNOSIS — R791 Abnormal coagulation profile: Secondary | ICD-10-CM | POA: Diagnosis present

## 2016-11-27 DIAGNOSIS — D539 Nutritional anemia, unspecified: Secondary | ICD-10-CM | POA: Diagnosis not present

## 2016-11-27 DIAGNOSIS — Z888 Allergy status to other drugs, medicaments and biological substances status: Secondary | ICD-10-CM

## 2016-11-27 DIAGNOSIS — Z86718 Personal history of other venous thrombosis and embolism: Secondary | ICD-10-CM

## 2016-11-27 DIAGNOSIS — R278 Other lack of coordination: Secondary | ICD-10-CM | POA: Diagnosis not present

## 2016-11-27 DIAGNOSIS — E860 Dehydration: Secondary | ICD-10-CM | POA: Diagnosis not present

## 2016-11-27 DIAGNOSIS — Z79899 Other long term (current) drug therapy: Secondary | ICD-10-CM

## 2016-11-27 DIAGNOSIS — Z882 Allergy status to sulfonamides status: Secondary | ICD-10-CM

## 2016-11-27 DIAGNOSIS — E876 Hypokalemia: Secondary | ICD-10-CM | POA: Diagnosis present

## 2016-11-27 DIAGNOSIS — J9611 Chronic respiratory failure with hypoxia: Secondary | ICD-10-CM | POA: Diagnosis not present

## 2016-11-27 DIAGNOSIS — I10 Essential (primary) hypertension: Secondary | ICD-10-CM | POA: Diagnosis present

## 2016-11-27 DIAGNOSIS — J189 Pneumonia, unspecified organism: Secondary | ICD-10-CM | POA: Diagnosis not present

## 2016-11-27 DIAGNOSIS — G9340 Encephalopathy, unspecified: Secondary | ICD-10-CM | POA: Diagnosis present

## 2016-11-27 DIAGNOSIS — R451 Restlessness and agitation: Secondary | ICD-10-CM | POA: Diagnosis present

## 2016-11-27 DIAGNOSIS — R4182 Altered mental status, unspecified: Secondary | ICD-10-CM | POA: Diagnosis not present

## 2016-11-27 DIAGNOSIS — G309 Alzheimer's disease, unspecified: Secondary | ICD-10-CM | POA: Diagnosis present

## 2016-11-27 DIAGNOSIS — Z66 Do not resuscitate: Secondary | ICD-10-CM | POA: Diagnosis present

## 2016-11-27 DIAGNOSIS — F0390 Unspecified dementia without behavioral disturbance: Secondary | ICD-10-CM | POA: Diagnosis not present

## 2016-11-27 DIAGNOSIS — G9349 Other encephalopathy: Secondary | ICD-10-CM | POA: Diagnosis not present

## 2016-11-27 DIAGNOSIS — Z8261 Family history of arthritis: Secondary | ICD-10-CM

## 2016-11-27 DIAGNOSIS — Z8249 Family history of ischemic heart disease and other diseases of the circulatory system: Secondary | ICD-10-CM

## 2016-11-27 DIAGNOSIS — F418 Other specified anxiety disorders: Secondary | ICD-10-CM | POA: Diagnosis not present

## 2016-11-27 DIAGNOSIS — L8990 Pressure ulcer of unspecified site, unspecified stage: Secondary | ICD-10-CM | POA: Insufficient documentation

## 2016-11-27 DIAGNOSIS — L89151 Pressure ulcer of sacral region, stage 1: Secondary | ICD-10-CM | POA: Diagnosis not present

## 2016-11-27 DIAGNOSIS — Z9981 Dependence on supplemental oxygen: Secondary | ICD-10-CM | POA: Diagnosis not present

## 2016-11-27 DIAGNOSIS — F329 Major depressive disorder, single episode, unspecified: Secondary | ICD-10-CM | POA: Diagnosis present

## 2016-11-27 DIAGNOSIS — F0280 Dementia in other diseases classified elsewhere without behavioral disturbance: Secondary | ICD-10-CM | POA: Diagnosis present

## 2016-11-27 DIAGNOSIS — N390 Urinary tract infection, site not specified: Principal | ICD-10-CM | POA: Diagnosis present

## 2016-11-27 DIAGNOSIS — F32A Depression, unspecified: Secondary | ICD-10-CM | POA: Diagnosis present

## 2016-11-27 DIAGNOSIS — Z781 Physical restraint status: Secondary | ICD-10-CM

## 2016-11-27 DIAGNOSIS — Z7901 Long term (current) use of anticoagulants: Secondary | ICD-10-CM

## 2016-11-27 DIAGNOSIS — T45511A Poisoning by anticoagulants, accidental (unintentional), initial encounter: Secondary | ICD-10-CM | POA: Diagnosis not present

## 2016-11-27 DIAGNOSIS — Z792 Long term (current) use of antibiotics: Secondary | ICD-10-CM

## 2016-11-27 DIAGNOSIS — J449 Chronic obstructive pulmonary disease, unspecified: Secondary | ICD-10-CM | POA: Diagnosis not present

## 2016-11-27 DIAGNOSIS — J479 Bronchiectasis, uncomplicated: Secondary | ICD-10-CM | POA: Diagnosis present

## 2016-11-27 DIAGNOSIS — R410 Disorientation, unspecified: Secondary | ICD-10-CM | POA: Diagnosis not present

## 2016-11-27 DIAGNOSIS — F0281 Dementia in other diseases classified elsewhere with behavioral disturbance: Secondary | ICD-10-CM | POA: Diagnosis present

## 2016-11-27 DIAGNOSIS — F29 Unspecified psychosis not due to a substance or known physiological condition: Secondary | ICD-10-CM | POA: Diagnosis not present

## 2016-11-27 DIAGNOSIS — R1311 Dysphagia, oral phase: Secondary | ICD-10-CM | POA: Diagnosis not present

## 2016-11-27 DIAGNOSIS — R05 Cough: Secondary | ICD-10-CM | POA: Diagnosis not present

## 2016-11-27 DIAGNOSIS — M6281 Muscle weakness (generalized): Secondary | ICD-10-CM | POA: Diagnosis not present

## 2016-11-27 DIAGNOSIS — Z833 Family history of diabetes mellitus: Secondary | ICD-10-CM | POA: Diagnosis not present

## 2016-11-27 DIAGNOSIS — J471 Bronchiectasis with (acute) exacerbation: Secondary | ICD-10-CM | POA: Diagnosis not present

## 2016-11-27 DIAGNOSIS — E43 Unspecified severe protein-calorie malnutrition: Secondary | ICD-10-CM

## 2016-11-27 DIAGNOSIS — Z880 Allergy status to penicillin: Secondary | ICD-10-CM

## 2016-11-27 DIAGNOSIS — Z801 Family history of malignant neoplasm of trachea, bronchus and lung: Secondary | ICD-10-CM

## 2016-11-27 LAB — COMPREHENSIVE METABOLIC PANEL
ALBUMIN: 3.5 g/dL (ref 3.5–5.0)
ALK PHOS: 48 U/L (ref 38–126)
ALT: 21 U/L (ref 14–54)
AST: 28 U/L (ref 15–41)
Anion gap: 11 (ref 5–15)
BILIRUBIN TOTAL: 1.1 mg/dL (ref 0.3–1.2)
BUN: 26 mg/dL — ABNORMAL HIGH (ref 6–20)
CALCIUM: 8.6 mg/dL — AB (ref 8.9–10.3)
CO2: 30 mmol/L (ref 22–32)
CREATININE: 0.99 mg/dL (ref 0.44–1.00)
Chloride: 98 mmol/L — ABNORMAL LOW (ref 101–111)
GFR calc non Af Amer: 50 mL/min — ABNORMAL LOW (ref >60)
GFR, EST AFRICAN AMERICAN: 58 mL/min — AB (ref >60)
GLUCOSE: 110 mg/dL — AB (ref 65–99)
Potassium: 3.2 mmol/L — ABNORMAL LOW (ref 3.5–5.1)
SODIUM: 139 mmol/L (ref 135–145)
TOTAL PROTEIN: 7 g/dL (ref 6.5–8.1)

## 2016-11-27 LAB — URINALYSIS, ROUTINE W REFLEX MICROSCOPIC
BACTERIA UA: NONE SEEN
Bilirubin Urine: NEGATIVE
GLUCOSE, UA: NEGATIVE mg/dL
HGB URINE DIPSTICK: NEGATIVE
Ketones, ur: 20 mg/dL — AB
NITRITE: NEGATIVE
PH: 5 (ref 5.0–8.0)
PROTEIN: 30 mg/dL — AB
Specific Gravity, Urine: 1.028 (ref 1.005–1.030)

## 2016-11-27 LAB — CBC WITH DIFFERENTIAL/PLATELET
BASOS PCT: 0 %
Basophils Absolute: 0 10*3/uL (ref 0.0–0.1)
EOS ABS: 0.1 10*3/uL (ref 0.0–0.7)
EOS PCT: 1 %
HCT: 34.9 % — ABNORMAL LOW (ref 36.0–46.0)
Hemoglobin: 11.3 g/dL — ABNORMAL LOW (ref 12.0–15.0)
Lymphocytes Relative: 6 %
Lymphs Abs: 0.9 10*3/uL (ref 0.7–4.0)
MCH: 32.8 pg (ref 26.0–34.0)
MCHC: 32.4 g/dL (ref 30.0–36.0)
MCV: 101.2 fL — ABNORMAL HIGH (ref 78.0–100.0)
MONO ABS: 1.6 10*3/uL — AB (ref 0.1–1.0)
MONOS PCT: 11 %
Neutro Abs: 11.9 10*3/uL — ABNORMAL HIGH (ref 1.7–7.7)
Neutrophils Relative %: 82 %
Platelets: 324 10*3/uL (ref 150–400)
RBC: 3.45 MIL/uL — ABNORMAL LOW (ref 3.87–5.11)
RDW: 14.7 % (ref 11.5–15.5)
WBC: 14.5 10*3/uL — ABNORMAL HIGH (ref 4.0–10.5)

## 2016-11-27 LAB — VALPROIC ACID LEVEL

## 2016-11-27 LAB — PROTIME-INR
INR: 4.35
PROTHROMBIN TIME: 43.2 s — AB (ref 11.4–15.2)

## 2016-11-27 MED ORDER — VALPROATE SODIUM 500 MG/5ML IV SOLN
250.0000 mg | Freq: Once | INTRAVENOUS | Status: AC
  Administered 2016-11-28: 250 mg via INTRAVENOUS
  Filled 2016-11-27: qty 2.5

## 2016-11-27 MED ORDER — DIVALPROEX SODIUM 125 MG PO CSDR
250.0000 mg | DELAYED_RELEASE_CAPSULE | Freq: Two times a day (BID) | ORAL | Status: DC
  Administered 2016-11-27 – 2016-12-02 (×8): 250 mg via ORAL
  Filled 2016-11-27 (×10): qty 2

## 2016-11-27 MED ORDER — MAGNESIUM SULFATE IN D5W 1-5 GM/100ML-% IV SOLN
1.0000 g | Freq: Once | INTRAVENOUS | Status: AC
  Administered 2016-11-27: 1 g via INTRAVENOUS
  Filled 2016-11-27: qty 100

## 2016-11-27 MED ORDER — AZTREONAM 2 G IJ SOLR
2.0000 g | Freq: Once | INTRAMUSCULAR | Status: AC
  Administered 2016-11-27: 2 g via INTRAVENOUS
  Filled 2016-11-27: qty 2

## 2016-11-27 MED ORDER — LEVOFLOXACIN 500 MG PO TABS
500.0000 mg | ORAL_TABLET | Freq: Every day | ORAL | Status: DC
  Filled 2016-11-27 (×2): qty 1

## 2016-11-27 MED ORDER — RIFAMPIN 300 MG PO CAPS
600.0000 mg | ORAL_CAPSULE | ORAL | Status: DC
  Administered 2016-12-01 – 2016-12-03 (×2): 600 mg via ORAL
  Filled 2016-11-27 (×3): qty 2

## 2016-11-27 MED ORDER — SODIUM CHLORIDE 0.9 % IV SOLN
INTRAVENOUS | Status: AC
  Administered 2016-11-27: 23:00:00 via INTRAVENOUS

## 2016-11-27 MED ORDER — ONDANSETRON HCL 4 MG PO TABS: 4.0000 mg | ORAL_TABLET | Freq: Four times a day (QID) | ORAL | Status: DC | PRN

## 2016-11-27 MED ORDER — WARFARIN SODIUM 2.5 MG PO TABS: 2.5000 mg | ORAL_TABLET | Freq: Every day | ORAL | Status: DC

## 2016-11-27 MED ORDER — AZITHROMYCIN 250 MG PO TABS
500.0000 mg | ORAL_TABLET | ORAL | Status: DC
  Administered 2016-12-01 – 2016-12-03 (×2): 500 mg via ORAL
  Filled 2016-11-27 (×2): qty 2

## 2016-11-27 MED ORDER — IPRATROPIUM-ALBUTEROL 0.5-2.5 (3) MG/3ML IN SOLN: 3.0000 mL | Freq: Four times a day (QID) | RESPIRATORY_TRACT | Status: DC | PRN

## 2016-11-27 MED ORDER — LATANOPROST 0.005 % OP SOLN
1.0000 [drp] | Freq: Every day | OPHTHALMIC | Status: DC
  Administered 2016-11-27 – 2016-12-02 (×5): 1 [drp] via OPHTHALMIC
  Filled 2016-11-27: qty 2.5

## 2016-11-27 MED ORDER — ALPRAZOLAM 0.5 MG PO TABS: 0.5000 mg | ORAL_TABLET | Freq: Four times a day (QID) | ORAL | Status: DC | PRN

## 2016-11-27 MED ORDER — DORZOLAMIDE HCL 2 % OP SOLN
1.0000 [drp] | Freq: Two times a day (BID) | OPHTHALMIC | Status: DC
  Administered 2016-11-27 – 2016-12-03 (×11): 1 [drp] via OPHTHALMIC
  Filled 2016-11-27: qty 10

## 2016-11-27 MED ORDER — ONDANSETRON HCL 4 MG/2ML IJ SOLN: 4.0000 mg | Freq: Four times a day (QID) | INTRAMUSCULAR | Status: DC | PRN

## 2016-11-27 MED ORDER — ETHAMBUTOL HCL 400 MG PO TABS
1600.0000 mg | ORAL_TABLET | ORAL | Status: DC
  Administered 2016-12-01 – 2016-12-03 (×2): 1600 mg via ORAL
  Filled 2016-11-27 (×3): qty 4

## 2016-11-27 MED ORDER — SPIRONOLACTONE 25 MG PO TABS: 25.0000 mg | ORAL_TABLET | Freq: Every day | ORAL | Status: DC

## 2016-11-27 MED ORDER — METOPROLOL TARTRATE 25 MG PO TABS
25.0000 mg | ORAL_TABLET | Freq: Two times a day (BID) | ORAL | Status: DC
  Administered 2016-11-27 – 2016-12-03 (×10): 25 mg via ORAL
  Filled 2016-11-27 (×11): qty 1

## 2016-11-27 MED ORDER — ACETAMINOPHEN 500 MG PO TABS
500.0000 mg | ORAL_TABLET | Freq: Four times a day (QID) | ORAL | Status: DC | PRN
  Administered 2016-11-27 – 2016-12-02 (×4): 500 mg via ORAL
  Filled 2016-11-27 (×4): qty 1

## 2016-11-27 MED ORDER — LORAZEPAM 2 MG/ML IJ SOLN
1.0000 mg | Freq: Once | INTRAMUSCULAR | Status: AC
  Administered 2016-11-28: 1 mg via INTRAVENOUS
  Filled 2016-11-27: qty 1

## 2016-11-27 MED ORDER — MIDAZOLAM HCL 2 MG/2ML IJ SOLN
3.0000 mg | Freq: Once | INTRAMUSCULAR | Status: AC
  Administered 2016-11-27: 3 mg via INTRAMUSCULAR
  Filled 2016-11-27: qty 4

## 2016-11-27 MED ORDER — HALOPERIDOL 1 MG PO TABS: 0.5000 mg | ORAL_TABLET | Freq: Two times a day (BID) | ORAL | Status: DC

## 2016-11-27 MED ORDER — OLANZAPINE 5 MG PO TBDP
5.0000 mg | ORAL_TABLET | Freq: Two times a day (BID) | ORAL | Status: DC
  Administered 2016-11-27 – 2016-12-03 (×10): 5 mg via ORAL
  Filled 2016-11-27 (×12): qty 1

## 2016-11-27 MED ORDER — LORAZEPAM 0.5 MG PO TABS
0.5000 mg | ORAL_TABLET | Freq: Four times a day (QID) | ORAL | Status: DC | PRN
  Administered 2016-11-27 – 2016-11-28 (×2): 0.5 mg via ORAL
  Filled 2016-11-27 (×3): qty 1

## 2016-11-27 MED ORDER — LEVOFLOXACIN 750 MG PO TABS: 750.0000 mg | ORAL_TABLET | ORAL | Status: DC

## 2016-11-27 MED ORDER — POTASSIUM CHLORIDE CRYS ER 20 MEQ PO TBCR
40.0000 meq | EXTENDED_RELEASE_TABLET | Freq: Once | ORAL | Status: AC
  Administered 2016-11-27: 40 meq via ORAL
  Filled 2016-11-27: qty 2

## 2016-11-27 MED ORDER — PAROXETINE HCL 20 MG PO TABS
20.0000 mg | ORAL_TABLET | Freq: Every day | ORAL | Status: DC
  Administered 2016-11-27 – 2016-12-03 (×6): 20 mg via ORAL
  Filled 2016-11-27 (×7): qty 1

## 2016-11-27 NOTE — ED Triage Notes (Signed)
Per EMS, [facility]. Staff called EMS because they want pt to get a psych evaluation. Staff states that psychiatrist would be unavailable for approx. Another week. Pt is experiencing restlessness and increased agitation. EMS reports that pt is coughing up thick sputum. Per EMS pt is alert to person and place and staff confirmed that was pt baseline.

## 2016-11-27 NOTE — H&P (Signed)
History and Physical    Stacie Wright VWU:[id number] DOB: 03-Oct-1927 DOA: 11/27/2016  PCP: Dwana Melena, MD   Patient coming from: [facility]F  Chief Complaint: Agitation, restlessness   HPI: Stacie Wright is a 83 y.o. female with medical history significant for Alzheimer's dementia, bronchiectasis on chronic antibiotic therapy, hypertension, and depression with anxiety who presents from her nursing facility for evaluation of agitation and restlessness. Patient has reportedly been agitated for the past 10 days at her nursing facility, sleeping very little if at all, wandering in and out of other resident's rooms, and interfering with staff as they attend to other residents. She is reportedly quite confused at baseline. There is been no recent fevers or chills reported and patient had a negative urinalysis early in the course of this illness. She had been treated with Xanax and Haldol at the nursing facility, but after telephone consultation with psychiatry, this was changed to low-dose Ativan and scheduled Zyprexa and Depakote. Unfortunately, despite treatment with these agents, the patient's condition has persisted and there is concern that she is posing a risk to her own safety as well as that of the other residents and staff at her facility. There has been no known trauma or fall recently and the patient is not known to use alcohol or illicit substances. She has not been able to express any specific complaints.  ED Course: Upon arrival to the ED, patient is found to be afebrile, saturating 87% on room air, and with vitals otherwise stable. EKG features sinus rhythm with RSR' in V1 and V2. Chest x-ray features diffuse coarse right greater than left interstitial opacities without any significant change and likely secondary to chronic fibrosis. Chemistry panels notable for a potassium of 3.2 and elevated BUN to creatinine ratio. CBC features a leukocytosis to 14,500 and a stable macrocytic anemia with  hemoglobin of 11.3 and MCV of 101.2. INR is supratherapeutic at 4.35. Urinalysis is suggestive of infection. Urine was sent for culture. Patient was treated with Tylenol in the ED and given a dose of IM Versed. 40 mEq of oral potassium was given. She remained hemodynamically stable and has been in no acute respiratory distress. She'll be admitted to the medical-surgical unit for ongoing evaluation and management of acute encephalopathy and agitation, with an apparent UTI which may be contributing.  Review of Systems:  Unable to obtain complete ROS secondary to the patient's clinical condition with acute encephalopathy.  Past Medical History:  Diagnosis Date  . Alzheimer's dementia   . Anxiety   . Arthritis   . Bronchiectasis (HCC) 2014  . COPD (chronic obstructive pulmonary disease) (HCC)   . DVT (deep venous thrombosis) (HCC) 2008   LLE  . Gout   . Headache(784.0)   . Hypertension   . IBS (irritable bowel syndrome)   . MAI (mycobacterium avium-intracellulare) infection (HCC) 2014  . Mold exposure 06/20/2015  . Pneumonia 04/2013    Past Surgical History:  Procedure Laterality Date  . ABDOMINAL HYSTERECTOMY    . CATARACT EXTRACTION    . CHOLECYSTECTOMY    . FRACTURE SURGERY       reports that she has never smoked. She has never used smokeless tobacco. She reports that she does not drink alcohol or use drugs.  Allergies  Allergen Reactions  . Amoxicillin Anaphylaxis and Rash  . Darvocet [Propoxyphene N-Acetaminophen] Other (See Comments)    hallucinations  . Penicillins Anaphylaxis and Rash    Has patient had a PCN reaction causing immediate rash, facial/tongue/throat  swelling, SOB or lightheadedness with hypotension: Yes Has patient had a PCN reaction causing severe rash involving mucus membranes or skin necrosis: No Has patient had a PCN reaction that required hospitalization No Has patient had a PCN reaction occurring within the last 10 years: No If all of the above answers  are "NO", then may proceed with Cephalosporin use.   . Sulfa Antibiotics Anaphylaxis and Rash  . Propoxyphene Other (See Comments)    hallucinations    Family History  Problem Relation Age of Onset  . Diabetes Father   . Hypertension Father   . Rheum arthritis Father   . Lung cancer Sister     smoked     Prior to Admission medications   Medication Sig Start Date End Date Taking? Authorizing Provider  acetaminophen (TYLENOL) 500 MG tablet Take 500 mg by mouth every 6 (six) hours as needed (pain).   Yes Historical Provider, MD  ALPRAZolam Prudy Feeler) 0.5 MG tablet Take 0.5 mg by mouth every 6 (six) hours as needed for anxiety.   Yes Historical Provider, MD  azithromycin (ZITHROMAX) 500 MG tablet Take 500 mg by mouth 3 (three) times a week. Monday, Wednesday, & Friday   Yes Historical Provider, MD  divalproex (DEPAKOTE SPRINKLE) 125 MG capsule Take 250 mg by mouth 2 (two) times daily.   Yes Historical Provider, MD  dorzolamide (TRUSOPT) 2 % ophthalmic solution Place 1 drop into both eyes 2 (two) times daily.   Yes Historical Provider, MD  ethambutol (MYAMBUTOL) 400 MG tablet Take 1,600 mg by mouth 3 (three) times a week. Monday, Wednesday, & Friday   Yes Historical Provider, MD  haloperidol (HALDOL) 0.5 MG tablet Take 0.5 mg by mouth 2 (two) times daily.   Yes Historical Provider, MD  ipratropium-albuterol (DUONEB) 0.5-2.5 (3) MG/3ML SOLN Take 3 mLs by nebulization every 6 (six) hours as needed (SOB).   Yes Historical Provider, MD  latanoprost (XALATAN) 0.005 % ophthalmic solution Place 1 drop into both eyes at bedtime.   Yes Historical Provider, MD  metoprolol tartrate (LOPRESSOR) 25 MG tablet Take 25 mg by mouth 2 (two) times daily.    Yes Historical Provider, MD  PARoxetine (PAXIL) 20 MG tablet Take 20 mg by mouth daily.   Yes Historical Provider, MD  rifampin (RIFADIN) 300 MG capsule Take 600 mg by mouth 3 (three) times a week. Monday, Wednesday, & Friday   Yes Historical Provider, MD    spironolactone (ALDACTONE) 25 MG tablet Take 25 mg by mouth daily.   Yes Historical Provider, MD  warfarin (COUMADIN) 2.5 MG tablet Take 2.5 mg by mouth one time only at 6 PM.    Yes Historical Provider, MD  levofloxacin (LEVAQUIN) 750 MG tablet Take 1 tablet (750 mg total) by mouth every other day. Patient not taking: Reported on 11/27/2016 11/18/16   Freddrick March, MD  predniSONE (DELTASONE) 50 MG tablet Take one tablet by mouth once a day Patient not taking: Reported on 11/27/2016 11/17/16   Freddrick March, MD    Physical Exam: Vitals:   11/27/16 1518 11/27/16 1630 11/27/16 2030 11/27/16 2139  BP:  (!) 139/48 135/69 (!) 130/50  Pulse:  83 86 80  Resp:  20 20 16   Temp:  98.6 F (37 C) 97.8 F (36.6 C) 99.9 F (37.7 C)  TempSrc:  Oral Oral Oral  SpO2: 94% (!) 87% 95% 99%      Constitutional: No acute respiratory distress. Appears anxious, restless.  Eyes: PERTLA, lids and conjunctivae normal ENMT: Mucous  membranes are moist. Posterior pharynx clear of any exudate or lesions.   Neck: normal, supple, no masses, no thyromegaly Respiratory: Crackles bilaterally. No pallor. Normal respiratory effort. No accessory muscle use.  Cardiovascular: S1 & S2 heard, regular rate and rhythm. No significant JVD. Abdomen: No distension, no tenderness, no masses palpated. Bowel sounds normal.  Musculoskeletal: no clubbing / cyanosis. No joint deformity upper and lower extremities. Normal muscle tone.  Skin: no significant rashes, lesions, ulcers. Warm, dry, well-perfused. Neurologic: CN 2-12 grossly intact. Sensation intact, DTR normal. Strength 5/5 in all 4 limbs.  Psychiatric: Disoriented, anxious, restless.      Labs on Admission: I have personally reviewed following labs and imaging studies  CBC:  Recent Labs Lab 11/27/16 1810  WBC 14.5*  NEUTROABS 11.9*  HGB 11.3*  HCT 34.9*  MCV 101.2*  PLT 324   Basic Metabolic Panel:  Recent Labs Lab 11/27/16 1810  NA 139  K 3.2*  CL 98*   CO2 30  GLUCOSE 110*  BUN 26*  CREATININE 0.99  CALCIUM 8.6*   GFR: Estimated Creatinine Clearance: 41.1 mL/min (by C-G formula based on SCr of 0.99 mg/dL). Liver Function Tests:  Recent Labs Lab 11/27/16 1810  AST 28  ALT 21  ALKPHOS 48  BILITOT 1.1  PROT 7.0  ALBUMIN 3.5   No results for input(s): LIPASE, AMYLASE in the last 168 hours. No results for input(s): AMMONIA in the last 168 hours. Coagulation Profile:  Recent Labs Lab 11/21/16 11/27/16 1810  INR 1.2* 4.35*  PROTIME 14.8*  --    Cardiac Enzymes: No results for input(s): CKTOTAL, CKMB, CKMBINDEX, TROPONINI in the last 168 hours. BNP (last 3 results) No results for input(s): PROBNP in the last 8760 hours. HbA1C: No results for input(s): HGBA1C in the last 72 hours. CBG: No results for input(s): GLUCAP in the last 168 hours. Lipid Profile: No results for input(s): CHOL, HDL, LDLCALC, TRIG, CHOLHDL, LDLDIRECT in the last 72 hours. Thyroid Function Tests: No results for input(s): TSH, T4TOTAL, FREET4, T3FREE, THYROIDAB in the last 72 hours. Anemia Panel: No results for input(s): VITAMINB12, FOLATE, FERRITIN, TIBC, IRON, RETICCTPCT in the last 72 hours. Urine analysis:    Component Value Date/Time   COLORURINE AMBER (A) 11/27/2016 1700   APPEARANCEUR HAZY (A) 11/27/2016 1700   LABSPEC 1.028 11/27/2016 1700   PHURINE 5.0 11/27/2016 1700   GLUCOSEU NEGATIVE 11/27/2016 1700   HGBUR NEGATIVE 11/27/2016 1700   BILIRUBINUR NEGATIVE 11/27/2016 1700   KETONESUR 20 (A) 11/27/2016 1700   PROTEINUR 30 (A) 11/27/2016 1700   UROBILINOGEN 0.2 04/25/2013 0843   NITRITE NEGATIVE 11/27/2016 1700   LEUKOCYTESUR LARGE (A) 11/27/2016 1700   Sepsis Labs: @LABRCNTIP (procalcitonin:4,lacticidven:4) )No results found for this or any previous visit (from the past 240 hour(s)).   Radiological Exams on Admission: Dg Chest Port 1 View  Result Date: 11/27/2016 CLINICAL DATA:  Cough history of bronchiectasis EXAM: PORTABLE  CHEST 1 VIEW COMPARISON:  11/15/2016, 11/11/2016, CT 01/11/2016 FINDINGS: Diffuse interstitial opacities right greater than left, do not appear significantly changed compared with 11/15/2016, and are improved compared with 11/13/2016 and 11/11/2016. No new consolidation or effusion. Normal heart size. No pneumothorax. IMPRESSION: Diffuse coarse right greater than left interstitial opacities, much of which is suspected to be secondary to chronic fibrosis. Overall radiographic appearance of the chest does not appear significantly changed compared with 11/15/2016 and is improved from recent earlier radiographs Electronically Signed   By: Jasmine Pang M.D.   On: 11/27/2016 18:16  EKG: Independently reviewed. Sinus rhythm, RSR' in V1 and V2.   Assessment/Plan  1. Agitation, encephalopathy  - Pt presents with 10 days of agitation and restlessness at her nursing home - There are no focal neurologic deficits identified  - There is leukocytosis noted and UA suggestive of infection; plan to culture and treat as below - Check thyroid studies, B12, folate, RPR, ammonia  - Keep sitter at bedside, minimize tubes and lines, frequent re-orientation  - Continue Zyprexa, Depakote, and low-dose prn Ativan    2. UTI  - There is leukocytosis on admission and UA suggestive of possible infection  - Send sample for culture and treat empirically with fluoroquinalone given the possible concomitant exacerbation in bronchiectasis  - Follow culture and tailor therapy as indicated   3. Bronchiectasis, chronic hypoxic respiratory failure  - Pt with chronic hypoxic respiratory failure secondary to bronchiectasis  - CXR appears stable; she is noted to have cough productive of thick brownish sputum  - Check sputum culture and gram stain if pt cooperates with this  - Continue supplemental O2 and prn nebs, start Levaquin for possible exacerbation   4. Hx of DVT, supratherapeutic INR  - No evidence for acute VTE or  bleeding on admission  - Hold warfarin, repeat INR in am    5. Hypokalemia - Serum potassium 3.2 on admission  - 40 mEq oral potassium given   - Repeat chemistries in am    6. Macrocytic anemia  - Hgb 11.3 on admission with MCV 101.2, both indices stable relative to recent priors  - Checking B12 and folate levels as above     DVT prophylaxis: Coumdin Code Status: DNR Family Communication: Discussed with patient Disposition Plan: Admit to med-surg Consults called: None Admission status: Inpatient    Briscoe Deutscherimothy S Bonnetta Allbee, MD [facility] Pager [phone]  If 7PM-7AM, please contact night-coverage [email or web address] Password [email or web address]  11/27/2016, 10:06 PM

## 2016-11-27 NOTE — Progress Notes (Signed)
Brief Pharmacy Note - warfarin  Labs: INR 4.35  A/P: INR supratherpeutic. Warfarin will not be restarted at this time. Daily INR's have been ordered and will follow up.  Hessie KnowsJustin M Nylene Inlow, PharmD, BCPS Pager [phone] 11/27/2016 7:56 PM

## 2016-11-27 NOTE — BH Assessment (Addendum)
Assessment Note  Stacie Wright is an 83 y.o. female. She presents to [facility], voluntarily. She is from [facility]. She has lived at the facility since 11/17/2016 after being hospitalized on the medical floor for pneumonia. Since living at the facility patient has become increasingly agitated and restless. She is reportedly going into other residences room and difficult to redirect. Patient is a poor historian. Writer observed patient yelling, "Help, Help, Help". She did not answer any questions directly and appears to need redirection. Patient asked to hold this writers hand several times during the assessment. She did not confirm or deny SI, HI, and AVH's. Patient's psychiatric history is unknown. She does not have a regular outpatient psychiatrist. Unable to determine if she has been hospitalized in the past for psychiatric reasons. Patient's alcohol and drug history is unknown.    Diagnosis: Dementia with behavioral disturbance  Past Medical History:  Past Medical History:  Diagnosis Date  . Alzheimer's dementia   . Anxiety   . Arthritis   . Bronchiectasis (HCC) 2014  . COPD (chronic obstructive pulmonary disease) (HCC)   . DVT (deep venous thrombosis) (HCC) 2008   LLE  . Gout   . Headache(784.0)   . Hypertension   . IBS (irritable bowel syndrome)   . MAI (mycobacterium avium-intracellulare) infection (HCC) 2014  . Mold exposure 06/20/2015  . Pneumonia 04/2013    Past Surgical History:  Procedure Laterality Date  . ABDOMINAL HYSTERECTOMY    . CATARACT EXTRACTION    . CHOLECYSTECTOMY    . FRACTURE SURGERY      Family History:  Family History  Problem Relation Age of Onset  . Diabetes Father   . Hypertension Father   . Rheum arthritis Father   . Lung cancer Sister     smoked    Social History:  reports that she has never smoked. She has never used smokeless tobacco. She reports that she does not drink alcohol or use drugs.  Additional Social History:  Alcohol /  Drug Use Pain Medications: See PTA Prescriptions: See PTA Over the Counter: See PTA History of alcohol / drug use?:  (unknown )  CIWA: CIWA-Ar BP: (!) 139/48 Pulse Rate: 83 COWS:    Allergies:  Allergies  Allergen Reactions  . Amoxicillin Anaphylaxis and Rash  . Darvocet [Propoxyphene N-Acetaminophen] Other (See Comments)    hallucinations  . Penicillins Anaphylaxis and Rash    Has patient had a PCN reaction causing immediate rash, facial/tongue/throat swelling, SOB or lightheadedness with hypotension: Yes Has patient had a PCN reaction causing severe rash involving mucus membranes or skin necrosis: No Has patient had a PCN reaction that required hospitalization No Has patient had a PCN reaction occurring within the last 10 years: No If all of the above answers are "NO", then may proceed with Cephalosporin use.   . Sulfa Antibiotics Anaphylaxis and Rash  . Propoxyphene Other (See Comments)    hallucinations    Home Medications:  (Not in a hospital admission)  OB/GYN Status:  No LMP recorded. Patient has had a hysterectomy.  General Assessment Data Location of Assessment: [facility] ED TTS Assessment: In system Is this a Tele or Face-to-Face Assessment?: Face-to-Face Is this an Initial Assessment or a Re-assessment for this encounter?: Initial Assessment Marital status: Married [place] name:  (unk) Is patient pregnant?: No Pregnancy Status: No Living Arrangements:  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 :  (unk) Suicidal Intent:  (unk) Has patient had any suicidal intent within the past 6 months prior to admission? : (P)  (unk) Is patient at risk for suicide?:  (unk) Suicidal Plan?:  (unk) Has patient had any suicidal plan within the past 6 months prior to admission? :  (unk) Specify Current Suicidal Plan:  (unk) Access to Means:  (unk) What has been your use of drugs/alcohol within the last 12 months?:  (unk) Previous Attempts/Gestures:  (unk) How many times?:  (unk) Other Self Harm Risks:  (unk) Triggers for Past Attempts: Unknown Intentional Self Injurious Behavior:  (unk) Family Suicide History: (P) Unknown Recent stressful life event(s): (P)  (unk) Persecutory voices/beliefs?:  (unk) Depression:  (unk) Depression Symptoms:  (unk) Substance abuse history and/or treatment for substance abuse?:  (unk) Suicide prevention information given to non-admitted patients:  (unk)  Risk to Others within the past 6 months Homicidal Ideation:  (unk) Does patient have any lifetime risk of violence toward others beyond the six months prior to admission? : Unknown Thoughts of Harm to Others:  (unk) Current Homicidal Intent:  (unk) Current Homicidal Plan:  (unk) Access to Homicidal Means:  (unk) Identified Victim:  (unk) History of harm to others?:  (unk) Assessment of Violence:  (patient reportedly uncooperative at SNF) Violent Behavior Description:  (calm and cooperative ) Does patient have access to weapons?:  (unk) Criminal Charges Pending?:  (unk) Does patient have a court date:  (unk) Is patient on probation?: Unknown  Psychosis Hallucinations:  (unk) Delusions:  (unk)  Mental Status Report Appearance/Hygiene: In scrubs Eye Contact: Good Motor Activity: Agitation Speech:  Logical/coherent Level of Consciousness: (P) Alert, Irritable Mood: (P) Anxious, Irritable Affect: (P) Anxious Anxiety Level: (P)  (unk) Thought Processes: (P) Circumstantial, Irrelevant Judgement: Impaired Orientation: Not oriented Obsessive Compulsive Thoughts/Behaviors: None  Cognitive Functioning Concentration: Decreased Memory: (P) Recent Impaired, Remote Impaired IQ: Average Insight: Poor Impulse Control: Poor Appetite: (P) Poor Weight Loss: (P)  (n/a) Weight Gain: (P)  (none reported) Sleep: (P) Decreased Total Hours of Sleep: (P)  (n/a) Vegetative Symptoms: (P) None  ADLScreening [facility] Assessment Services) Patient's cognitive ability adequate to safely complete daily activities?: Yes Patient able to express need for assistance with ADLs?: Yes Independently performs ADLs?:  (unk)  Prior Inpatient Therapy Prior Inpatient Therapy: Yes Prior Therapy Dates:  (unk) Prior Therapy Facilty/Provider(s):  (unk) Reason for Treatment:  (unk)  Prior Outpatient Therapy Prior Outpatient Therapy: No Prior Therapy Dates:  (n/a) Prior Therapy Facilty/Provider(s):  (n/a) Reason for Treatment:  (n/a) Does patient have an ACCT team?: Unknown Does patient have Intensive In-House Services?  : Unknown Does patient have [facility] services? : Unknown Does patient have P4CC services?: Unknown  ADL Screening (condition at time of admission) Patient's cognitive ability adequate to safely complete daily activities?: Yes Is the patient deaf or have difficulty hearing?: No Does the patient have difficulty seeing, even when wearing glasses/contacts?: No Does  the patient have difficulty concentrating, remembering, or making decisions?: No Patient able to express need for assistance with ADLs?: Yes Does the patient have difficulty dressing or bathing?: No Independently performs ADLs?:  (unk) Does the patient have difficulty walking or climbing stairs?: No Weakness of Legs: None Weakness of  Arms/Hands: None  Home Assistive Devices/Equipment Home Assistive Devices/Equipment: None    Abuse/Neglect Assessment (Assessment to be complete while patient is alone) Physical Abuse:  (unk) Verbal Abuse:  (unk) Sexual Abuse:  (unk) Exploitation of patient/patient's resources:  (unk) Self-Neglect:  (unk)     Merchant navy officer (For Healthcare) Does Patient Have a Medical Advance Directive?:  (unk) Does patient want to make changes to medical advance directive?:  (unk) Type of Advance Directive: Out of facility DNR (pink MOST or yellow form) Pre-existing out of facility DNR order (yellow form or pink MOST form): Yellow form placed in chart (order not valid for inpatient use) Nutrition Screen- [facility] Patient's home diet: Regular  Additional Information 1:1 In Past 12 Months?: (P) No CIRT Risk: No Elopement Risk: No Does patient have medical clearance?: Yes     Disposition:  Disposition Initial Assessment Completed for this Encounter: Yes Disposition of Patient: Inpatient treatment program (Per Nanine Means, DNP, Patient meets [place] for [facility]) Type of inpatient treatment program: Adult  On Site Evaluation by:   Reviewed with Physician:    Melynda Ripple 11/27/2016 6:05 PM

## 2016-11-27 NOTE — ED Notes (Signed)
Attempt to call report x2  

## 2016-11-27 NOTE — Assessment & Plan Note (Signed)
11/27/16  I am requesting emergent evaluation by behavioral health  as I am concerned about the patient's mental and physical health as well as the welfare of the other residents at this facility. Despite medication change and specialty consultation with medication titration, there's been no change in her psychiatricstautus over the past 10 days. In fact the symptoms appear to be escalating. "Chemical restraint" would potentially violate the standard of care despite these risks as noted above Apparently the patient was previously in [facility] in a memory unit, such placement is clinically indicated once her acute psychiatric situation is stable.

## 2016-11-27 NOTE — Patient Instructions (Signed)
See assessment and plan under each diagnosis in the problem list and acutely for this visit 

## 2016-11-27 NOTE — ED Notes (Signed)
Bed: [id number] Expected date:  Expected time:  Means of arrival:  Comments: 83 yo f agitation

## 2016-11-27 NOTE — ED Provider Notes (Signed)
[facility]-EMERGENCY DEPT Provider Note   CSN: [id number] Arrival date & time: 11/27/16  1507     History   Chief Complaint No chief complaint on file.  Chief complaint agitation HPI ARTIS BUECHELE is a 83 y.o. female.  HPI History is obtained from Dr.Hopper via telephone patient is currently staying at assisted-living facility where she is been for the past 10 days she has been constantly agitated. Has slept minimally if at all, and has been wandering in and out of other patient's rooms. Dr. Alwyn Ren is concerned that patient may be at risk for being harmed by other patients or of harming other patients. She's been treated with Haldol, Depakote and Xanax without relief  Past Medical History:  Diagnosis Date  . Alzheimer's dementia   . Anxiety   . Arthritis   . Bronchiectasis (HCC) 2014  . COPD (chronic obstructive pulmonary disease) (HCC)   . DVT (deep venous thrombosis) (HCC) 2008   LLE  . Gout   . Headache(784.0)   . Hypertension   . IBS (irritable bowel syndrome)   . MAI (mycobacterium avium-intracellulare) infection (HCC) 2014  . Mold exposure 06/20/2015  . Pneumonia 04/2013    Patient Active Problem List   Diagnosis Date Noted  . Hx of gout 11/20/2016  . Recurrent deep vein thrombosis (DVT) (HCC) 11/18/2016  . HCAP (healthcare-associated pneumonia)   . Respiratory failure (HCC) 11/10/2016  . Pneumonia of right lung due to infectious organism   . Lactic acidosis   . Sepsis (HCC)   . Obstructive bronchiectasis (HCC) 11/01/2015  . Bronchiectasis (HCC) 10/11/2015  . Acute respiratory failure with hypoxia (HCC) 10/11/2015  . Acute exacerbation of bronchiectasis (HCC) 10/11/2015  . Mold exposure 06/20/2015  . Generalized anxiety disorder 01/19/2015  . Depression 01/19/2015  . Dementia in Alzheimer's disease 01/19/2015  . Mycobacterium avium-intracellulare infection (HCC) 01/18/2015  . Influenza with pneumonia 01/15/2015  . Cellulitis 06/09/2013  . Infected hardware in  left leg (HCC) 06/09/2013  . Constipation 04/27/2013  . Pneumonia, organism unspecified(486) 04/26/2013  . COPD exacerbation (HCC) 04/26/2013  . Bronchiectasis without acute exacerbation (HCC) 04/26/2013  . Pulmonary nodule 04/26/2013  . Acute bronchitis 04/25/2013  . Generalized weakness 04/25/2013  . Hypoxia 04/25/2013  . Hypertension     Past Surgical History:  Procedure Laterality Date  . ABDOMINAL HYSTERECTOMY    . CATARACT EXTRACTION    . CHOLECYSTECTOMY    . FRACTURE SURGERY      OB History    No data available       Home Medications    Prior to Admission medications   Medication Sig Start Date End Date Taking? Authorizing Provider  acetaminophen (TYLENOL) 500 MG tablet Take 500 mg by mouth every 6 (six) hours as needed for moderate pain.     Historical Provider, MD  ALPRAZolam Prudy Feeler) 0.5 MG tablet Take 0.5 mg by mouth every 6 (six) hours as needed for anxiety.    Historical Provider, MD  azithromycin (ZITHROMAX) 500 MG tablet Take 500 mg by mouth 3 (three) times a week. Monday, Wednesday, & Friday    Historical Provider, MD  divalproex (DEPAKOTE SPRINKLE) 125 MG capsule Take 250 mg by mouth 2 (two) times daily.    Historical Provider, MD  dorzolamide (TRUSOPT) 2 % ophthalmic solution Place 1 drop into both eyes 2 (two) times daily.    Historical Provider, MD  ethambutol (MYAMBUTOL) 400 MG tablet Take 1,600 mg by mouth 3 (three) times a week. Monday, Wednesday, & Friday  Historical Provider, MD  haloperidol (HALDOL) 0.5 MG tablet Take 0.5 mg by mouth 2 (two) times daily.    Historical Provider, MD  latanoprost (XALATAN) 0.005 % ophthalmic solution Place 1 drop into both eyes at bedtime.    Historical Provider, MD  levofloxacin (LEVAQUIN) 750 MG tablet Take 1 tablet (750 mg total) by mouth every other day. 11/18/16   Freddrick March, MD  metoprolol tartrate (LOPRESSOR) 25 MG tablet Take 25 mg by mouth 2 (two) times daily.     Historical Provider, MD  PARoxetine (PAXIL) 20  MG tablet Take 20 mg by mouth daily.    Historical Provider, MD  predniSONE (DELTASONE) 50 MG tablet Take one tablet by mouth once a day Patient not taking: Reported on 11/27/2016 11/17/16   Freddrick March, MD  rifampin (RIFADIN) 300 MG capsule Take 600 mg by mouth 3 (three) times a week. Monday, Wednesday, & Friday    Historical Provider, MD  spironolactone (ALDACTONE) 25 MG tablet Take 25 mg by mouth daily.    Historical Provider, MD  warfarin (COUMADIN) 2.5 MG tablet Take 2.5 mg by mouth daily.    Historical Provider, MD    Family History Family History  Problem Relation Age of Onset  . Diabetes Father   . Hypertension Father   . Rheum arthritis Father   . Lung cancer Sister     smoked    Social History Social History  Substance Use Topics  . Smoking status: Never Smoker  . Smokeless tobacco: Never Used  . Alcohol use No     Allergies   Amoxicillin; Darvocet [propoxyphene n-acetaminophen]; Penicillins; Sulfa antibiotics; and Propoxyphene   Review of Systems Review of Systems  Unable to perform ROS: Dementia  Respiratory: Positive for cough.        Patient suffers from chronic cough     Physical Exam Updated Vital Signs BP (!) 139/48 (BP Location: Left Arm)   Pulse 83   Temp 98.6 F (37 C) (Oral)   Resp 20   SpO2 (!) 87%   Physical Exam  Constitutional:  Agitated ,yerlling  HENT:  Head: Normocephalic and atraumatic.  Eyes: Conjunctivae are normal. Pupils are equal, round, and reactive to light.  Neck: Neck supple. No tracheal deviation present. No thyromegaly present.  Cardiovascular: Normal rate and regular rhythm.   No murmur heard. Pulmonary/Chest: Effort normal and breath sounds normal.  Abdominal: Soft. Bowel sounds are normal. She exhibits no distension. There is no tenderness.  Musculoskeletal: Normal range of motion. She exhibits no edema or tenderness.  Neurological: She is alert. Coordination normal.  Walks without difficulty. Moves all extremities  cranial nerves II through XII grossly intact. Gait normal  Skin: Skin is warm and dry. No rash noted.  Psychiatric:  Agitated  Nursing note and vitals reviewed.    ED Treatments / Results  Labs (all labs ordered are listed, but only abnormal results are displayed) Labs Reviewed  COMPREHENSIVE METABOLIC PANEL  CBC WITH DIFFERENTIAL/PLATELET  URINALYSIS, ROUTINE W REFLEX MICROSCOPIC  VALPROIC ACID LEVEL  PROTIME-INR    EKG  EKG Interpretation  Date/Time:  Thursday November 27 2016 20:44:22 EDT Ventricular Rate:  82 PR Interval:    QRS Duration: 109 QT Interval:  386 QTC Calculation: 451 R Axis:   54 Text Interpretation:  Normal sinus rhythm RSR' in V1 or V2, right VCD or RVH Borderline ST elevation, lateral leads No significant change since last tracing Confirmed by Ethelda Chick  MD, Starkeisha Vanwinkle [phone]) on 11/27/2016 8:51:40 PM  Radiology No results found.  Procedures Procedures (including critical care time)  Medications Ordered in ED Medications  acetaminophen (TYLENOL) tablet 500 mg (not administered)  azithromycin (ZITHROMAX) tablet 500 mg (not administered)  divalproex (DEPAKOTE SPRINKLE) capsule 250 mg (not administered)  dorzolamide (TRUSOPT) 2 % ophthalmic solution 1 drop (not administered)  ethambutol (MYAMBUTOL) tablet 1,600 mg (not administered)  latanoprost (XALATAN) 0.005 % ophthalmic solution 1 drop (not administered)  levofloxacin (LEVAQUIN) tablet 750 mg (not administered)  metoprolol tartrate (LOPRESSOR) tablet 25 mg (not administered)  PARoxetine (PAXIL) tablet 20 mg (not administered)  rifampin (RIFADIN) capsule 600 mg (not administered)  spironolactone (ALDACTONE) tablet 25 mg (not administered)  warfarin (COUMADIN) tablet 2.5 mg (not administered)  OLANZapine zydis (ZYPREXA) disintegrating tablet 5 mg (not administered)  LORazepam (ATIVAN) tablet 0.5 mg (not administered)  midazolam (VERSED) injection 3 mg (3 mg Intramuscular Given 11/27/16 1707)    Results for orders placed or performed during the hospital encounter of 11/27/16  Comprehensive metabolic panel  Result Value Ref Range   Sodium 139 135 - 145 mmol/L   Potassium 3.2 (L) 3.5 - 5.1 mmol/L   Chloride 98 (L) 101 - 111 mmol/L   CO2 30 22 - 32 mmol/L   Glucose, Bld 110 (H) 65 - 99 mg/dL   BUN 26 (H) 6 - 20 mg/dL   Creatinine, Ser 1.32 0.44 - 1.00 mg/dL   Calcium 8.6 (L) 8.9 - 10.3 mg/dL   Total Protein 7.0 6.5 - 8.1 g/dL   Albumin 3.5 3.5 - 5.0 g/dL   AST 28 15 - 41 U/L   ALT 21 14 - 54 U/L   Alkaline Phosphatase 48 38 - 126 U/L   Total Bilirubin 1.1 0.3 - 1.2 mg/dL   GFR calc non Af Amer 50 (L) >60 mL/min   GFR calc Af Amer 58 (L) >60 mL/min   Anion gap 11 5 - 15  CBC with Differential/Platelet  Result Value Ref Range   WBC 14.5 (H) 4.0 - 10.5 K/uL   RBC 3.45 (L) 3.87 - 5.11 MIL/uL   Hemoglobin 11.3 (L) 12.0 - 15.0 g/dL   HCT 44.0 (L) 10.2 - 72.5 %   MCV 101.2 (H) 78.0 - 100.0 fL   MCH 32.8 26.0 - 34.0 pg   MCHC 32.4 30.0 - 36.0 g/dL   RDW 36.6 44.0 - 34.7 %   Platelets 324 150 - 400 K/uL   Neutrophils Relative % 82 %   Neutro Abs 11.9 (H) 1.7 - 7.7 K/uL   Lymphocytes Relative 6 %   Lymphs Abs 0.9 0.7 - 4.0 K/uL   Monocytes Relative 11 %   Monocytes Absolute 1.6 (H) 0.1 - 1.0 K/uL   Eosinophils Relative 1 %   Eosinophils Absolute 0.1 0.0 - 0.7 K/uL   Basophils Relative 0 %   Basophils Absolute 0.0 0.0 - 0.1 K/uL  Urinalysis, Routine w reflex microscopic  Result Value Ref Range   Color, Urine AMBER (A) YELLOW   APPearance HAZY (A) CLEAR   Specific Gravity, Urine 1.028 1.005 - 1.030   pH 5.0 5.0 - 8.0   Glucose, UA NEGATIVE NEGATIVE mg/dL   Hgb urine dipstick NEGATIVE NEGATIVE   Bilirubin Urine NEGATIVE NEGATIVE   Ketones, ur 20 (A) NEGATIVE mg/dL   Protein, ur 30 (A) NEGATIVE mg/dL   Nitrite NEGATIVE NEGATIVE   Leukocytes, UA LARGE (A) NEGATIVE   RBC / HPF 6-30 0 - 5 RBC/hpf   WBC, UA TOO NUMEROUS TO COUNT 0 - 5 WBC/hpf  Bacteria, UA NONE SEEN  NONE SEEN   Squamous Epithelial / LPF 0-5 (A) NONE SEEN   Mucous PRESENT    Non Squamous Epithelial 0-5 (A) NONE SEEN  Valproic acid level  Result Value Ref Range   Valproic Acid Lvl <10 (L) 50.0 - 100.0 ug/mL  Protime-INR  Result Value Ref Range   Prothrombin Time 43.2 (H) 11.4 - 15.2 seconds   INR 4.35 (HH)    Dg Chest Port 1 View  Result Date: 11/27/2016 CLINICAL DATA:  Cough history of bronchiectasis EXAM: PORTABLE CHEST 1 VIEW COMPARISON:  11/15/2016, 11/11/2016, CT 01/11/2016 FINDINGS: Diffuse interstitial opacities right greater than left, do not appear significantly changed compared with 11/15/2016, and are improved compared with 11/13/2016 and 11/11/2016. No new consolidation or effusion. Normal heart size. No pneumothorax. IMPRESSION: Diffuse coarse right greater than left interstitial opacities, much of which is suspected to be secondary to chronic fibrosis. Overall radiographic appearance of the chest does not appear significantly changed compared with 11/15/2016 and is improved from recent earlier radiographs Electronically Signed   By: Jasmine Pang M.D.   On: 11/27/2016 18:16   Dg Chest Port 1 View  Result Date: 11/15/2016 CLINICAL DATA:  Cough.  Shortness of breath. EXAM: PORTABLE CHEST 1 VIEW COMPARISON:  11/13/2016 and 10/06/2016 FINDINGS: Mildly improved aeration in both lungs compared to the recent comparison examination. There continues to be prominent interstitial densities in both lungs, particularly in the right lung. Some of the disease in the left hilar region appears to represent chronic changes. Heart size is within normal limits. The trachea is midline. Negative for pneumothorax. No acute bone abnormality. IMPRESSION: Mildly improved aeration in both lungs. Findings could represent decreasing pulmonary edema or possibly infection. There continues to be prominent interstitial densities in the lungs, right side greater than left. This most likely represents acute on  chronic disease. Electronically Signed   By: Richarda Overlie M.D.   On: 11/15/2016 13:46   Dg Chest Port 1 View  Result Date: 11/13/2016 CLINICAL DATA:  Respiratory failure. EXAM: PORTABLE CHEST 1 VIEW COMPARISON:  Radiograph of November 11, 2016. FINDINGS: Stable cardiomediastinal silhouette. Increased bilateral diffuse interstitial densities are noted concerning for pulmonary edema or possibly inflammation. Increased right apical airspace opacity is noted concerning for pneumonia. No pneumothorax is noted. No significant pleural effusion is noted. Bony thorax is unremarkable. IMPRESSION: Increased bilateral diffuse interstitial densities are noted concerning for worsening pulmonary edema or possibly inflammation. Increased right apical airspace opacity concerning for pneumonia. Electronically Signed   By: Lupita Raider, M.D.   On: 11/13/2016 13:00   Dg Chest Port 1 View  Result Date: 11/11/2016 CLINICAL DATA:  Respiratory failure. EXAM: PORTABLE CHEST 1 VIEW COMPARISON:  11/10/2016. FINDINGS: Cardiomegaly with normal pulmonary vascularity. Right upper lobe and bilateral lower lobe pulmonary infiltrates. Interim significant progression from prior exam. Small right pleural effusion. No pneumothorax. IMPRESSION: Right upper lobe and bilateral lower lobe infiltrates noted consistent with pneumonia. Findings have progressed significantly from prior exam. Small right pleural effusion Electronically Signed   By: Maisie Fus  Register   On: 11/11/2016 07:35   Dg Chest Portable 1 View  Result Date: 11/10/2016 CLINICAL DATA:  Shortness of breath, weakness EXAM: PORTABLE CHEST 1 VIEW COMPARISON:  10/06/2016 FINDINGS: Patchy airspace disease noted in the right upper lobe and both lung bases. This is similar to prior study and prior CT in the lung bases, likely related to bronchiectasis seen on prior CT. This is increased slightly in the right upper  lobe. Heart is normal size. No effusions or acute bony abnormality.  IMPRESSION: Bronchiectatic changes in the lung bases. Increasing density in the right upper lobe. Cannot exclude superimposed pneumonia. Electronically Signed   By: Charlett Nose M.D.   On: 11/10/2016 09:11    Initial Impression / Assessment and Plan / ED Course  I have reviewed the triage vital signs and the nursing notes.  Pertinent labs & imaging results that were available during my care of the patient were reviewed by me and considered in my medical decision making (see chart for details).   psychiatric nurse practitioner Shaune Pollack consulted via telephone. Suggest discontinue Haldol, discontinue Xanax. Start Zyprexa 5 mg twice a day, Ativan 0.5 mg every 6 hours as needed for agitation Patient noted to be toxic on Coumadin. Coumadin should be withheld. Also noted to have urinary tract infection with leukocytosis.. Urine sent for culture. IV antibiotics ordered. Oral potassium supplementation ordered. I've consulted Dr.Opyd hospitalist service who will arrange for overnight stay to medical unit. Patient should have psychiatric consultation Final Clinical Impressions(s) / ED Diagnoses  Diagnosis #1 urinary tract infection #2 Coumadin toxicity #3 hypokalemia #4agitation Final diagnoses:  None    New Prescriptions New Prescriptions   No medications on file     Doug Sou, MD 11/27/16 2053

## 2016-11-27 NOTE — Progress Notes (Signed)
This is a nursing facility follow up for specific acute issue of dementia with psychosis and  delusions  Interim medical record and care since last [facility] visit was updated with review of diagnostic studies and change in clinical status since last visit were documented.  HPI: The patient was admitted to [facility] 11[facility]17/16 after being hospitalized 2/26-11/17/16 with healthcare associated pneumonia  in the context of advanced lung disease with bronchiectasis and atypical mycobacterial lung infection. The pneumonia was associated with sepsis. Despite clinical improvement she required 3 L of nasal oxygen for persistent significant hypoxia. Since admission to the facility she has exhibited persistent disruptive behavior including yelling "help me, help me" ever louder despite multiple attempts to calm her. She also does exhibit hypomanic restlessness ambulating via the wheelchair throughout the facility and entering patients' rooms. Attempts to engage her in social and group activities have been unsuccessful. Apparently she had been admitted to the hospital from a memory care unit, but sent to this facility for rehabilitation. The patient is oriented only to self, unable to give the year, month, season, or the name of the president. She maintains that she must return to her home in [place] to be with her mother and father who are obviously long dead. When told that unfortunately her father was dead she categorically denied it. Her behavior has progressed despite change from Risperdal to Haldol.Psychiatry evaluation was completed 3/13. Diagnoses included mood disorder with psychosis, recurrent depression, dementia and Alzheimer's disease with delusions and behavioral disturbance. Haldol was increased mg  at bedtime for 3 days and then it was to be titrated to twice a day to stabilize her mood. There's been no response to the change in medicines and the disruptive behavior and  screeming has only escalated. On multiple occasions staff have tried to calm the patient . When I told her that she was putting her health @ risk and disturbing the other residents her response was "I don't care"  Review of systems: Dementia invalidated responses.   Physical exam:  Pertinent or positive findings: Surprisingly ,O2 sats 90% on room air  When interacting one-on-one she does become calm for a short period time. She will continue to restate the delusional ideation about returning home with her parents. Surprisingly she is able to follow simple commands such as "hold up 2 fingers on your left hand and raise your right leg" Dental hygiene is fair with multiple broken or missing teeth. She has asymmetric juicy rhonchi, mainly on the right. Pedal pulses are decreased.1/2 + edema @ sockline  General appearance:Adequately nourished; no acute distress , increased work of breathing is present despite auscultory findings.   Lymphatic: No lymphadenopathy about the head, neck, axilla . Eyes: No conjunctival inflammation or lid edema is present. There is no scleral icterus. Ears:  External ear exam shows no significant lesions or deformities.   Nose:  External nasal examination shows no deformity or inflammation. Nasal mucosa are pink and moist without lesions ,exudates Oral exam: lips and gums are healthy appearing.There is no oropharyngeal erythema or exudate . Neck:  No thyromegaly, masses, tenderness noted.    Heart:  Normal rate and regular rhythm. S1 and S2 normal without gallop, murmur, click, rub .  Abdomen:Bowel sounds are decreased. Abdomen is soft and nontender with no organomegaly, hernias,masses. GU: deferred  Extremities:  No cyanosis  Neurologic exam : Strength equal  in upper & lower extremities Balance,Rhomberg,finger to nose testing could not be completed due to clinical  state Skin: Warm & dry w/o tenting. No significant lesions or rash.  See summary under each active  problem in the Problem List with associated updated therapeutic plan

## 2016-11-27 NOTE — ED Notes (Signed)
Report called to Tom on 5E 

## 2016-11-27 NOTE — ED Notes (Signed)
Patient on 2 liters of 02 at home.

## 2016-11-27 NOTE — Progress Notes (Signed)
Consult request has been received. CSW following up at present time.  Jaymarie Yeakel F. Mickaela Starlin, LCSWA, LCAS Clinical Social Worker Ph: [phone]  

## 2016-11-27 NOTE — ED Notes (Signed)
This RN attempted to call report. Floor nurse will call TCU when he is ready to take report

## 2016-11-28 ENCOUNTER — Inpatient Hospital Stay (HOSPITAL_COMMUNITY): Payer: PPO

## 2016-11-28 DIAGNOSIS — G9340 Encephalopathy, unspecified: Secondary | ICD-10-CM | POA: Diagnosis present

## 2016-11-28 DIAGNOSIS — L8990 Pressure ulcer of unspecified site, unspecified stage: Secondary | ICD-10-CM | POA: Insufficient documentation

## 2016-11-28 DIAGNOSIS — T45511A Poisoning by anticoagulants, accidental (unintentional), initial encounter: Secondary | ICD-10-CM

## 2016-11-28 LAB — CBC WITH DIFFERENTIAL/PLATELET
BASOS PCT: 0 %
Basophils Absolute: 0 10*3/uL (ref 0.0–0.1)
EOS ABS: 0.1 10*3/uL (ref 0.0–0.7)
EOS PCT: 2 %
HCT: 29.9 % — ABNORMAL LOW (ref 36.0–46.0)
Hemoglobin: 9.5 g/dL — ABNORMAL LOW (ref 12.0–15.0)
Lymphocytes Relative: 17 %
Lymphs Abs: 1.3 10*3/uL (ref 0.7–4.0)
MCH: 31.5 pg (ref 26.0–34.0)
MCHC: 31.8 g/dL (ref 30.0–36.0)
MCV: 99 fL (ref 78.0–100.0)
MONO ABS: 0.9 10*3/uL (ref 0.1–1.0)
MONOS PCT: 12 %
Neutro Abs: 5.1 10*3/uL (ref 1.7–7.7)
Neutrophils Relative %: 69 %
Platelets: 250 10*3/uL (ref 150–400)
RBC: 3.02 MIL/uL — ABNORMAL LOW (ref 3.87–5.11)
RDW: 14.7 % (ref 11.5–15.5)
WBC: 7.4 10*3/uL (ref 4.0–10.5)

## 2016-11-28 LAB — BASIC METABOLIC PANEL
Anion gap: 5 (ref 5–15)
BUN: 24 mg/dL — AB (ref 6–20)
CHLORIDE: 103 mmol/L (ref 101–111)
CO2: 33 mmol/L — AB (ref 22–32)
CREATININE: 0.99 mg/dL (ref 0.44–1.00)
Calcium: 8.2 mg/dL — ABNORMAL LOW (ref 8.9–10.3)
GFR calc non Af Amer: 50 mL/min — ABNORMAL LOW (ref >60)
GFR, EST AFRICAN AMERICAN: 58 mL/min — AB (ref >60)
Glucose, Bld: 92 mg/dL (ref 65–99)
Potassium: 3.5 mmol/L (ref 3.5–5.1)
Sodium: 141 mmol/L (ref 135–145)

## 2016-11-28 LAB — GLUCOSE, CAPILLARY
Glucose-Capillary: 80 mg/dL (ref 65–99)
Glucose-Capillary: 88 mg/dL (ref 65–99)

## 2016-11-28 LAB — PROTIME-INR
INR: 4.39 — AB
Prothrombin Time: 43.5 seconds — ABNORMAL HIGH (ref 11.4–15.2)

## 2016-11-28 MED ORDER — LORAZEPAM 2 MG/ML IJ SOLN
0.5000 mg | Freq: Four times a day (QID) | INTRAMUSCULAR | Status: DC | PRN
  Administered 2016-11-28 – 2016-11-29 (×4): 0.5 mg via INTRAVENOUS
  Filled 2016-11-28 (×4): qty 1

## 2016-11-28 MED ORDER — DEXTROSE 5 % IV SOLN
1.0000 g | Freq: Three times a day (TID) | INTRAVENOUS | Status: DC
  Administered 2016-11-28 – 2016-11-29 (×4): 1 g via INTRAVENOUS
  Filled 2016-11-28 (×7): qty 1

## 2016-11-28 MED ORDER — DEXTROSE-NACL 5-0.9 % IV SOLN
INTRAVENOUS | Status: DC
  Administered 2016-11-28 – 2016-12-02 (×8): via INTRAVENOUS

## 2016-11-28 NOTE — Progress Notes (Signed)
Pharmacy Antibiotic Note  Stacie Wright is a 83 y.o. female admitted on 11/27/2016 with UTI.  Pharmacy has been consulted for aztreonam dosing.  Plan: Aztreonam 1g IV q8h. F/u urine culture to guide therapy, note patient has tolerated cephalosporins in the past.     Temp (24hrs), Avg:98.4 F (36.9 C), Min:97.6 F (36.4 C), Max:99.9 F (37.7 C)   Recent Labs Lab 11/27/16 1810 11/28/16 0553  WBC 14.5* 7.4  CREATININE 0.99 0.99    Estimated Creatinine Clearance: 41.1 mL/min (by C-G formula based on SCr of 0.99 mg/dL).    Allergies  Allergen Reactions  . Amoxicillin Anaphylaxis and Rash  . Darvocet [Propoxyphene N-Acetaminophen] Other (See Comments)    hallucinations  . Penicillins Anaphylaxis and Rash    Has patient had a PCN reaction causing immediate rash, facial/tongue/throat swelling, SOB or lightheadedness with hypotension: Yes Has patient had a PCN reaction causing severe rash involving mucus membranes or skin necrosis: No Has patient had a PCN reaction that required hospitalization No Has patient had a PCN reaction occurring within the last 10 years: No If all of the above answers are "NO", then may proceed with Cephalosporin use.   . Sulfa Antibiotics Anaphylaxis and Rash  . Propoxyphene Other (See Comments)    hallucinations    Antimicrobials this admission: 3/15 aztreonam >>  Dose adjustments this admission: -  Microbiology results: 3/15 UCx: sent  Thank you for allowing pharmacy to be a part of this patient's care.  Clance BollRunyon, Fernandez Kenley 11/28/2016 11:02 AM

## 2016-11-28 NOTE — Progress Notes (Signed)
ANTICOAGULATION CONSULT NOTE - Initial Consult  Pharmacy Consult for warfarin Indication: DVT  Allergies  Allergen Reactions  . Amoxicillin Anaphylaxis and Rash  . Darvocet [Propoxyphene N-Acetaminophen] Other (See Comments)    hallucinations  . Penicillins Anaphylaxis and Rash    Has patient had a PCN reaction causing immediate rash, facial/tongue/throat swelling, SOB or lightheadedness with hypotension: Yes Has patient had a PCN reaction causing severe rash involving mucus membranes or skin necrosis: No Has patient had a PCN reaction that required hospitalization No Has patient had a PCN reaction occurring within the last 10 years: No If all of the above answers are "NO", then may proceed with Cephalosporin use.   . Sulfa Antibiotics Anaphylaxis and Rash  . Propoxyphene Other (See Comments)    hallucinations    Patient Measurements:   Heparin Dosing Weight:   Vital Signs: Temp: 99.9 F (37.7 C) (03/15 2139) Temp Source: Oral (03/15 2139) BP: 130/50 (03/15 2139) Pulse Rate: 80 (03/15 2139)  Labs:  Recent Labs  11/27/16 1810  HGB 11.3*  HCT 34.9*  PLT 324  LABPROT 43.2*  INR 4.35*  CREATININE 0.99    Estimated Creatinine Clearance: 41.1 mL/min (by C-G formula based on SCr of 0.99 mg/dL).   Medical History: Past Medical History:  Diagnosis Date  . Alzheimer's dementia   . Anxiety   . Arthritis   . Bronchiectasis (HCC) 2014  . COPD (chronic obstructive pulmonary disease) (HCC)   . DVT (deep venous thrombosis) (HCC) 2008   LLE  . Gout   . Headache(784.0)   . Hypertension   . IBS (irritable bowel syndrome)   . MAI (mycobacterium avium-intracellulare) infection (HCC) 2014  . Mold exposure 06/20/2015  . Pneumonia 04/2013    Medications:  Prescriptions Prior to Admission  Medication Sig Dispense Refill Last Dose  . acetaminophen (TYLENOL) 500 MG tablet Take 500 mg by mouth every 6 (six) hours as needed (pain).   11/19/2016  . ALPRAZolam (XANAX) 0.5 MG  tablet Take 0.5 mg by mouth every 6 (six) hours as needed for anxiety.   11/24/2016  . azithromycin (ZITHROMAX) 500 MG tablet Take 500 mg by mouth 3 (three) times a week. Monday, Wednesday, & Friday   11/26/2016 at Unknown time  . divalproex (DEPAKOTE SPRINKLE) 125 MG capsule Take 250 mg by mouth 2 (two) times daily.   11/26/2016 at Unknown time  . dorzolamide (TRUSOPT) 2 % ophthalmic solution Place 1 drop into both eyes 2 (two) times daily.   11/27/2016 at Unknown time  . ethambutol (MYAMBUTOL) 400 MG tablet Take 1,600 mg by mouth 3 (three) times a week. Monday, Wednesday, & Friday   11/26/2016 at Unknown time  . haloperidol (HALDOL) 0.5 MG tablet Take 0.5 mg by mouth 2 (two) times daily.   11/27/2016 at Unknown time  . ipratropium-albuterol (DUONEB) 0.5-2.5 (3) MG/3ML SOLN Take 3 mLs by nebulization every 6 (six) hours as needed (SOB).   11/19/2016  . latanoprost (XALATAN) 0.005 % ophthalmic solution Place 1 drop into both eyes at bedtime.   11/26/2016 at Unknown time  . metoprolol tartrate (LOPRESSOR) 25 MG tablet Take 25 mg by mouth 2 (two) times daily.    11/27/2016 at 0900  . PARoxetine (PAXIL) 20 MG tablet Take 20 mg by mouth daily.   11/26/2016 at Unknown time  . rifampin (RIFADIN) 300 MG capsule Take 600 mg by mouth 3 (three) times a week. Monday, Wednesday, & Friday   11/26/2016 at Unknown time  . spironolactone (ALDACTONE) 25 MG  tablet Take 25 mg by mouth daily.   11/27/2016 at Unknown time  . warfarin (COUMADIN) 2.5 MG tablet Take 2.5 mg by mouth one time only at 6 PM.    11/26/2016 at 1800  . levofloxacin (LEVAQUIN) 750 MG tablet Take 1 tablet (750 mg total) by mouth every other day. (Patient not taking: Reported on 11/27/2016) 3 tablet 0 Completed Course at Unknown time  . predniSONE (DELTASONE) 50 MG tablet Take one tablet by mouth once a day (Patient not taking: Reported on 11/27/2016) 3 tablet 0 Not Taking at Unknown time   Scheduled:  . azithromycin  500 mg Oral Once per day on Mon Wed Fri  .  divalproex  250 mg Oral BID  . dorzolamide  1 drop Both Eyes BID  . ethambutol  1,600 mg Oral Once per day on Mon Wed Fri  . latanoprost  1 drop Both Eyes QHS  . levofloxacin  500 mg Oral QHS  . metoprolol tartrate  25 mg Oral BID  . OLANZapine zydis  5 mg Oral BID  . PARoxetine  20 mg Oral Daily  . rifampin  600 mg Oral Once per day on Mon Wed Fri    Assessment: Patient with history of DVT taking chronic warfarin.  INR on admit > 4.  MD entered warfarin per pharmacy protocol.    Goal of Therapy:  INR 2-3    Plan:  Daily INR No warfarin at this time.  Darlina GuysGrimsley Jr, Jacquenette ShoneJulian Crowford 11/28/2016,4:27 AM

## 2016-11-28 NOTE — Progress Notes (Signed)
PROGRESS NOTE    Stacie Wright  ZOX:[id number] DOB: 1928-04-11 DOA: 11/27/2016 PCP: Dwana MelenaZack Hall, MD   Brief Narrative: Stacie CourtDorothy H Wright is a 83 y.o. female with medical history significant for Alzheimer's dementia, bronchiectasis on chronic antibiotic therapy, hypertension, and depression with anxiety who presents from her nursing facility for evaluation of agitation and restlessness. Patient has reportedly been agitated for the past 10 days at her nursing facility, sleeping very little if at all, wandering in and out of other resident's rooms, and interfering with staff as they attend to other residents. She is reportedly quite confused at baseline. There is been no recent fevers or chills reported and patient had a negative urinalysis early in the course of this illness. She had been treated with Xanax and Haldol at the nursing facility, but after telephone consultation with psychiatry, this was changed to low-dose Ativan and scheduled Zyprexa and Depakote. Unfortunately, despite treatment with these agents, the patient's condition has persisted and there is concern that she is posing a risk to her own safety as well as that of the other residents and staff at her facility. There has been no known trauma or fall recently and the patient is not known to use alcohol or illicit substances. She has not been able to express any specific complaints.  Assessment & Plan:   Principal Problem:   Acute encephalopathy Active Problems:   Hypertension   Chronic respiratory failure with hypoxia (HCC)   Depression   Dementia in Alzheimer's disease   Obstructive bronchiectasis (HCC)   Recurrent deep vein thrombosis (DVT) (HCC)   Acute lower UTI   Agitation   Hypokalemia   Macrocytic anemia   Supratherapeutic INR   Coumadin toxicity, accidental or unintentional, initial encounter   Acute encephalopathy , agitation;  -Pt presents with 10 days of agitation and restlessness at her nursing home. -Continue  Zyprexa, Depakote, and low-dose prn Ativan  She is sleepy this morning.  Treat for infectious precess.  Check CT head.  Ammonia , TSH, B12 pending.  Change IV fluids to D 5,   UTI; unable to take oral. Continue with Aztreonam.  Follow culture.  WBC trending down.   Bronchiectasis, chronic hypoxic respiratory failure. Continue with oxygen supplementation and IV antibiotics.  Continue with nebulizer.   History of DVT;  supra therapeutic INR.  Coumadin per pharmacy to dose.    Macrocytic anemia ; follow b 112 and folate level.   Hypokalemia; replaced.      DVT prophylaxis: on coumadin.  Code Status:DNR Family Communication: none at bedside.  Disposition Plan: remain inpatient , back to SNF when stable.   Consultants:   none   Procedures; none   Antimicrobials:   Aztreonam 3-16   Subjective: Sleepy, was awake all night.    Objective: Vitals:   11/27/16 1630 11/27/16 2030 11/27/16 2139 11/28/16 0535  BP: (!) 139/48 135/69 (!) 130/50 (!) 105/47  Pulse: 83 86 80 60  Resp: 20 20 16 18   Temp: 98.6 F (37 C) 97.8 F (36.6 C) 99.9 F (37.7 C) 97.6 F (36.4 C)  TempSrc: Oral Oral Oral Oral  SpO2: (!) 87% 95% 99% 99%    Intake/Output Summary (Last 24 hours) at 11/28/16 1033 Last data filed at 11/28/16 0600  Gross per 24 hour  Intake           933.33 ml  Output                0 ml  Net  933.33 ml   There were no vitals filed for this visit.  Examination:  General exam: Appears calm and comfortable , sleepy  Respiratory system: Clear to auscultation. Respiratory effort normal. Cardiovascular system: S1 & S2 heard, RRR. No JVD, murmurs, rubs, gallops or clicks. No pedal edema. Gastrointestinal system: Abdomen is nondistended, soft and nontender. No organomegaly or masses felt. Normal bowel sounds heard. Central nervous system: Sleepy  Extremities: Symmetric 5 x 5 power. Skin: No rashes, lesions or ulcers Psychiatry: unable to evaluate.      Data Reviewed: I have personally reviewed following labs and imaging studies  CBC:  Recent Labs Lab 11/27/16 1810 11/28/16 0553  WBC 14.5* 7.4  NEUTROABS 11.9* 5.1  HGB 11.3* 9.5*  HCT 34.9* 29.9*  MCV 101.2* 99.0  PLT 324 250   Basic Metabolic Panel:  Recent Labs Lab 11/27/16 1810 11/28/16 0553  NA 139 141  K 3.2* 3.5  CL 98* 103  CO2 30 33*  GLUCOSE 110* 92  BUN 26* 24*  CREATININE 0.99 0.99  CALCIUM 8.6* 8.2*   GFR: Estimated Creatinine Clearance: 41.1 mL/min (by C-G formula based on SCr of 0.99 mg/dL). Liver Function Tests:  Recent Labs Lab 11/27/16 1810  AST 28  ALT 21  ALKPHOS 48  BILITOT 1.1  PROT 7.0  ALBUMIN 3.5   No results for input(s): LIPASE, AMYLASE in the last 168 hours. No results for input(s): AMMONIA in the last 168 hours. Coagulation Profile:  Recent Labs Lab 11/27/16 1810 11/28/16 0553  INR 4.35* 4.39*   Cardiac Enzymes: No results for input(s): CKTOTAL, CKMB, CKMBINDEX, TROPONINI in the last 168 hours. BNP (last 3 results) No results for input(s): PROBNP in the last 8760 hours. HbA1C: No results for input(s): HGBA1C in the last 72 hours. CBG:  Recent Labs Lab 11/28/16 0743  GLUCAP 80   Lipid Profile: No results for input(s): CHOL, HDL, LDLCALC, TRIG, CHOLHDL, LDLDIRECT in the last 72 hours. Thyroid Function Tests: No results for input(s): TSH, T4TOTAL, FREET4, T3FREE, THYROIDAB in the last 72 hours. Anemia Panel: No results for input(s): VITAMINB12, FOLATE, FERRITIN, TIBC, IRON, RETICCTPCT in the last 72 hours. Sepsis Labs: No results for input(s): PROCALCITON, LATICACIDVEN in the last 168 hours.  No results found for this or any previous visit (from the past 240 hour(s)).       Radiology Studies: Dg Chest Port 1 View  Result Date: 11/27/2016 CLINICAL DATA:  Cough history of bronchiectasis EXAM: PORTABLE CHEST 1 VIEW COMPARISON:  11/15/2016, 11/11/2016, CT 01/11/2016 FINDINGS: Diffuse interstitial  opacities right greater than left, do not appear significantly changed compared with 11/15/2016, and are improved compared with 11/13/2016 and 11/11/2016. No new consolidation or effusion. Normal heart size. No pneumothorax. IMPRESSION: Diffuse coarse right greater than left interstitial opacities, much of which is suspected to be secondary to chronic fibrosis. Overall radiographic appearance of the chest does not appear significantly changed compared with 11/15/2016 and is improved from recent earlier radiographs Electronically Signed   By: Jasmine Pang M.D.   On: 11/27/2016 18:16        Scheduled Meds: . azithromycin  500 mg Oral Once per day on Mon Wed Fri  . divalproex  250 mg Oral BID  . dorzolamide  1 drop Both Eyes BID  . ethambutol  1,600 mg Oral Once per day on Mon Wed Fri  . latanoprost  1 drop Both Eyes QHS  . levofloxacin  500 mg Oral QHS  . metoprolol tartrate  25 mg Oral BID  .  OLANZapine zydis  5 mg Oral BID  . PARoxetine  20 mg Oral Daily  . rifampin  600 mg Oral Once per day on Mon Wed Fri   Continuous Infusions:   LOS: 1 day    Time spent: 35 minutes.     Alba Cory, MD [facility] Pager [phone]  If 7PM-7AM, please contact night-coverage [email or web address] Password [facility] 11/28/2016, 10:33 AM

## 2016-11-29 LAB — URINE CULTURE: SPECIAL REQUESTS: NORMAL

## 2016-11-29 LAB — PROTIME-INR
INR: 3.69
PROTHROMBIN TIME: 37.5 s — AB (ref 11.4–15.2)

## 2016-11-29 LAB — GLUCOSE, CAPILLARY: GLUCOSE-CAPILLARY: 108 mg/dL — AB (ref 65–99)

## 2016-11-29 MED ORDER — AZTREONAM IN DEXTROSE 1 GM/50ML IV SOLN
1.0000 g | Freq: Three times a day (TID) | INTRAVENOUS | Status: DC
  Administered 2016-11-30 – 2016-12-03 (×12): 1 g via INTRAVENOUS
  Filled 2016-11-29 (×15): qty 50

## 2016-11-29 MED ORDER — HALOPERIDOL 1 MG PO TABS: 1.0000 mg | ORAL_TABLET | Freq: Once | ORAL | Status: AC

## 2016-11-29 MED ORDER — HALOPERIDOL LACTATE 5 MG/ML IJ SOLN
INTRAMUSCULAR | Status: AC
  Administered 2016-11-29: 1 mg via INTRAMUSCULAR
  Filled 2016-11-29: qty 1

## 2016-11-29 MED ORDER — LIP MEDEX EX OINT
TOPICAL_OINTMENT | CUTANEOUS | Status: AC
  Administered 2016-11-29: 1
  Filled 2016-11-29: qty 7

## 2016-11-29 MED ORDER — HALOPERIDOL LACTATE 5 MG/ML IJ SOLN
1.0000 mg | Freq: Once | INTRAMUSCULAR | Status: AC
  Administered 2016-11-29: 1 mg via INTRAMUSCULAR

## 2016-11-29 NOTE — Progress Notes (Signed)
Pt. Is screaming and fighting the staff trying to get out of bed. Page to the Dr. To get an order for restraints and something to help her calm down. She has pulled her IV out and it's leaking. Haldol 1mg  given IM per verbal order from Dr. Nelson ChimesAmin.

## 2016-11-29 NOTE — Progress Notes (Signed)
PROGRESS NOTE    Stacie Wright  ZOX:[id number] DOB: [birth year] DOA: 11/27/2016 PCP: Dwana Melena, MD   Brief Narrative: Stacie Wright is a 83 y.o. female with medical history significant for Alzheimer's dementia, bronchiectasis on chronic antibiotic therapy, hypertension, and depression with anxiety who presents from her nursing facility for evaluation of agitation and restlessness. Patient has reportedly been agitated for the past 10 days at her nursing facility, sleeping very little if at all, wandering in and out of other resident's rooms, and interfering with staff as they attend to other residents. She is reportedly quite confused at baseline. There is been no recent fevers or chills reported and patient had a negative urinalysis early in the course of this illness. She had been treated with Xanax and Haldol at the nursing facility, but after telephone consultation with psychiatry, this was changed to low-dose Ativan and scheduled Zyprexa and Depakote. Unfortunately, despite treatment with these agents, the patient's condition has persisted and there is concern that she is posing a risk to her own safety as well as that of the other residents and staff at her facility. There has been no known trauma or fall recently and the patient is not known to use alcohol or illicit substances. She has not been able to express any specific complaints.  Assessment & Plan:   Principal Problem:   Acute encephalopathy Active Problems:   Hypertension   Chronic respiratory failure with hypoxia (HCC)   Depression   Dementia in Alzheimer's disease   Obstructive bronchiectasis (HCC)   Recurrent deep vein thrombosis (DVT) (HCC)   Acute lower UTI   Agitation   Hypokalemia   Macrocytic anemia   Supratherapeutic INR   Coumadin toxicity, accidental or unintentional, initial encounter   Pressure injury of skin   Acute encephalopathy , agitation;  -Pt presents with 10 days of agitation and restlessness at her  nursing home. -Continue Zyprexa, Depakote, and low-dose prn Ativan  Treat for infectious precess.   CT head negative Ammonia , TSH, B12 pending.  continue with  IV fluids to D 5,  Improved this morning, patient alert, less agitated. She was able to tell me her name.  UTI; unable to take oral. Continue with Aztreonam.  Culture growing multiple bacterial morphotype, will resend culture.  WBC trending down.   Bronchiectasis, chronic hypoxic respiratory failure. Continue with oxygen supplementation and IV antibiotics.  Continue with nebulizer.   History of DVT;  supra therapeutic INR. INR trending down.  Coumadin per pharmacy to dose.    Macrocytic anemia ; follow b 112 and folate level.   Hypokalemia; replaced.      DVT prophylaxis: on coumadin.  Code Status:DNR Family Communication: none at bedside.  Disposition Plan: remain inpatient , back to SNF when stable.   Consultants:   none   Procedures; none   Antimicrobials:   Aztreonam 3-16   Subjective: She is alert this morning,. Still confused. Less agitated and more calm.    Objective: Vitals:   11/28/16 0535 11/28/16 1331 11/28/16 2116 11/29/16 0640  BP: (!) 105/47 (!) 128/53 115/72 (!) 148/59  Pulse: 60 60 72 80  Resp: 18 16 18 16   Temp: 97.6 F (36.4 C) 97.8 F (36.6 C) 97.2 F (36.2 C) 97.6 F (36.4 C)  TempSrc: Oral Oral Oral Oral  SpO2: 99% 97% 95% 99%  Weight:    65.9 kg (145 lb 4.5 oz)    Intake/Output Summary (Last 24 hours) at 11/29/16 1208 Last data filed at 11/29/16  [id number]  Gross per 24 hour  Intake              850 ml  Output                0 ml  Net              850 ml   Filed Weights   11/29/16 0640  Weight: 65.9 kg (145 lb 4.5 oz)    Examination:  General exam: Appears calm and comfortable  Respiratory system: Clear to auscultation. Respiratory effort normal. Cardiovascular system: S1 & S2 heard, RRR. No JVD, murmurs, rubs, gallops or clicks. No pedal  edema. Gastrointestinal system: Abdomen is nondistended, soft and nontender. No organomegaly or masses felt. Normal bowel sounds heard. Central nervous system: Sleepy  Extremities: Symmetric 5 x 5 power. Skin: No rashes, lesions or ulcers Psychiatry: less agitated.     Data Reviewed: I have personally reviewed following labs and imaging studies  CBC:  Recent Labs Lab 11/27/16 1810 11/28/16 0553  WBC 14.5* 7.4  NEUTROABS 11.9* 5.1  HGB 11.3* 9.5*  HCT 34.9* 29.9*  MCV 101.2* 99.0  PLT 324 250   Basic Metabolic Panel:  Recent Labs Lab 11/27/16 1810 11/28/16 0553  NA 139 141  K 3.2* 3.5  CL 98* 103  CO2 30 33*  GLUCOSE 110* 92  BUN 26* 24*  CREATININE 0.99 0.99  CALCIUM 8.6* 8.2*   GFR: Estimated Creatinine Clearance: 41.1 mL/min (by C-G formula based on SCr of 0.99 mg/dL). Liver Function Tests:  Recent Labs Lab 11/27/16 1810  AST 28  ALT 21  ALKPHOS 48  BILITOT 1.1  PROT 7.0  ALBUMIN 3.5   No results for input(s): LIPASE, AMYLASE in the last 168 hours. No results for input(s): AMMONIA in the last 168 hours. Coagulation Profile:  Recent Labs Lab 11/27/16 1810 11/28/16 0553 11/29/16 0633  INR 4.35* 4.39* 3.69   Cardiac Enzymes: No results for input(s): CKTOTAL, CKMB, CKMBINDEX, TROPONINI in the last 168 hours. BNP (last 3 results) No results for input(s): PROBNP in the last 8760 hours. HbA1C: No results for input(s): HGBA1C in the last 72 hours. CBG:  Recent Labs Lab 11/28/16 0743 11/28/16 1131 11/29/16 0739  GLUCAP 80 88 108*   Lipid Profile: No results for input(s): CHOL, HDL, LDLCALC, TRIG, CHOLHDL, LDLDIRECT in the last 72 hours. Thyroid Function Tests: No results for input(s): TSH, T4TOTAL, FREET4, T3FREE, THYROIDAB in the last 72 hours. Anemia Panel: No results for input(s): VITAMINB12, FOLATE, FERRITIN, TIBC, IRON, RETICCTPCT in the last 72 hours. Sepsis Labs: No results for input(s): PROCALCITON, LATICACIDVEN in the last 168  hours.  Recent Results (from the past 240 hour(s))  Urine culture     Status: Abnormal   Collection Time: 11/27/16  6:02 PM  Result Value Ref Range Status   Specimen Description URINE, CLEAN CATCH  Final   Special Requests Normal  Final   Culture MULTIPLE SPECIES PRESENT, SUGGEST RECOLLECTION (A)  Final   Report Status 11/29/2016 FINAL  Final         Radiology Studies: Ct Head Wo Contrast  Result Date: 11/28/2016 CLINICAL DATA:  Confusion. EXAM: CT HEAD WITHOUT CONTRAST TECHNIQUE: Contiguous axial images were obtained from the base of the skull through the vertex without intravenous contrast. COMPARISON:  11/30/2015 FINDINGS: Brain: There is no evidence of acute cortical infarct, intracranial hemorrhage, mass, midline shift, or extra-axial fluid collection. Cerebral atrophy is unchanged. Deep cerebral white matter hypodensities are stable to slightly more  prominent than on the prior study and nonspecific but compatible with chronic small vessel ischemic disease, relatively mild for age. Vascular: Calcified atherosclerosis at the skullbase. No hyperdense vessel. Skull: No fracture or focal osseous lesion. Sinuses/Orbits: Visualized paranasal sinuses and mastoid air cells are clear. Bilateral cataract extraction. Other: None. IMPRESSION: 1. No evidence of acute intracranial abnormality. 2. Mild chronic small vessel ischemic disease. Electronically Signed   By: Sebastian Ache M.D.   On: 11/28/2016 15:05   Dg Chest Port 1 View  Result Date: 11/27/2016 CLINICAL DATA:  Cough history of bronchiectasis EXAM: PORTABLE CHEST 1 VIEW COMPARISON:  11/15/2016, 11/11/2016, CT 01/11/2016 FINDINGS: Diffuse interstitial opacities right greater than left, do not appear significantly changed compared with 11/15/2016, and are improved compared with 11/13/2016 and 11/11/2016. No new consolidation or effusion. Normal heart size. No pneumothorax. IMPRESSION: Diffuse coarse right greater than left interstitial  opacities, much of which is suspected to be secondary to chronic fibrosis. Overall radiographic appearance of the chest does not appear significantly changed compared with 11/15/2016 and is improved from recent earlier radiographs Electronically Signed   By: Jasmine Pang M.D.   On: 11/27/2016 18:16        Scheduled Meds: . azithromycin  500 mg Oral Once per day on Mon Wed Fri  . aztreonam  1 g Intravenous Q8H  . divalproex  250 mg Oral BID  . dorzolamide  1 drop Both Eyes BID  . ethambutol  1,600 mg Oral Once per day on Mon Wed Fri  . latanoprost  1 drop Both Eyes QHS  . metoprolol tartrate  25 mg Oral BID  . OLANZapine zydis  5 mg Oral BID  . PARoxetine  20 mg Oral Daily  . rifampin  600 mg Oral Once per day on Mon Wed Fri   Continuous Infusions: . dextrose 5 % and 0.9% NaCl 75 mL/hr at 11/29/16 0107     LOS: 2 days    Time spent: 35 minutes.     Alba Cory, MD [facility] Pager [phone]  If 7PM-7AM, please contact night-coverage [email or web address] Password [facility] 11/29/2016, 12:08 PM

## 2016-11-29 NOTE — Progress Notes (Addendum)
ANTICOAGULATION CONSULT NOTE   Pharmacy Consult for warfarin Indication: DVT  Allergies  Allergen Reactions  . Amoxicillin Anaphylaxis and Rash  . Darvocet [Propoxyphene N-Acetaminophen] Other (See Comments)    hallucinations  . Penicillins Anaphylaxis and Rash    Has patient had a PCN reaction causing immediate rash, facial/tongue/throat swelling, SOB or lightheadedness with hypotension: Yes Has patient had a PCN reaction causing severe rash involving mucus membranes or skin necrosis: No Has patient had a PCN reaction that required hospitalization No Has patient had a PCN reaction occurring within the last 10 years: No If all of the above answers are "NO", then may proceed with Cephalosporin use.   . Sulfa Antibiotics Anaphylaxis and Rash  . Propoxyphene Other (See Comments)    hallucinations   Patient Measurements: Weight: 145 lb 4.5 oz (65.9 kg)  Vital Signs: Temp: 97.6 F (36.4 C) (03/17 0640) Temp Source: Oral (03/17 0640) BP: 148/59 (03/17 0640) Pulse Rate: 80 (03/17 0640)  Labs:  Recent Labs  11/27/16 1810 11/28/16 0553 11/29/16 0633  HGB 11.3* 9.5*  --   HCT 34.9* 29.9*  --   PLT 324 250  --   LABPROT 43.2* 43.5* 37.5*  INR 4.35* 4.39* 3.69  CREATININE 0.99 0.99  --    Estimated Creatinine Clearance: 41.1 mL/min (by C-G formula based on SCr of 0.99 mg/dL).  Medical History: Past Medical History:  Diagnosis Date  . Alzheimer's dementia   . Anxiety   . Arthritis   . Bronchiectasis (HCC) 2014  . COPD (chronic obstructive pulmonary disease) (HCC)   . DVT (deep venous thrombosis) (HCC) 2008   LLE  . Gout   . Headache(784.0)   . Hypertension   . IBS (irritable bowel syndrome)   . MAI (mycobacterium avium-intracellulare) infection (HCC) 2014  . Mold exposure 06/20/2015  . Pneumonia 04/2013   Medications:  Prescriptions Prior to Admission  Medication Sig Dispense Refill Last Dose  . acetaminophen (TYLENOL) 500 MG tablet Take 500 mg by mouth every 6  (six) hours as needed (pain).   11/19/2016  . ALPRAZolam (XANAX) 0.5 MG tablet Take 0.5 mg by mouth every 6 (six) hours as needed for anxiety.   11/24/2016  . azithromycin (ZITHROMAX) 500 MG tablet Take 500 mg by mouth 3 (three) times a week. Monday, Wednesday, & Friday   11/26/2016 at Unknown time  . divalproex (DEPAKOTE SPRINKLE) 125 MG capsule Take 250 mg by mouth 2 (two) times daily.   11/26/2016 at Unknown time  . dorzolamide (TRUSOPT) 2 % ophthalmic solution Place 1 drop into both eyes 2 (two) times daily.   11/27/2016 at Unknown time  . ethambutol (MYAMBUTOL) 400 MG tablet Take 1,600 mg by mouth 3 (three) times a week. Monday, Wednesday, & Friday   11/26/2016 at Unknown time  . haloperidol (HALDOL) 0.5 MG tablet Take 0.5 mg by mouth 2 (two) times daily.   11/27/2016 at Unknown time  . ipratropium-albuterol (DUONEB) 0.5-2.5 (3) MG/3ML SOLN Take 3 mLs by nebulization every 6 (six) hours as needed (SOB).   11/19/2016  . latanoprost (XALATAN) 0.005 % ophthalmic solution Place 1 drop into both eyes at bedtime.   11/26/2016 at Unknown time  . metoprolol tartrate (LOPRESSOR) 25 MG tablet Take 25 mg by mouth 2 (two) times daily.    11/27/2016 at 0900  . PARoxetine (PAXIL) 20 MG tablet Take 20 mg by mouth daily.   11/26/2016 at Unknown time  . rifampin (RIFADIN) 300 MG capsule Take 600 mg by mouth 3 (three)  times a week. Monday, Wednesday, & Friday   11/26/2016 at Unknown time  . spironolactone (ALDACTONE) 25 MG tablet Take 25 mg by mouth daily.   11/27/2016 at Unknown time  . warfarin (COUMADIN) 2.5 MG tablet Take 2.5 mg by mouth one time only at 6 PM.    11/26/2016 at 1800  . levofloxacin (LEVAQUIN) 750 MG tablet Take 1 tablet (750 mg total) by mouth every other day. (Patient not taking: Reported on 11/27/2016) 3 tablet 0 Completed Course at Unknown time  . predniSONE (DELTASONE) 50 MG tablet Take one tablet by mouth once a day (Patient not taking: Reported on 11/27/2016) 3 tablet 0 Not Taking at Unknown time    Scheduled:  . azithromycin  500 mg Oral Once per day on Mon Wed Fri  . aztreonam  1 g Intravenous Q8H  . divalproex  250 mg Oral BID  . dorzolamide  1 drop Both Eyes BID  . ethambutol  1,600 mg Oral Once per day on Mon Wed Fri  . latanoprost  1 drop Both Eyes QHS  . metoprolol tartrate  25 mg Oral BID  . OLANZapine zydis  5 mg Oral BID  . PARoxetine  20 mg Oral Daily  . rifampin  600 mg Oral Once per day on Mon Wed Fri   Assessment: Patient to ED 3/15 with acute encephalopathy, 10 day hx of agitation, restlessness at SNF)  History of DVT on chronic warfarin.  INR on admit > 4. Home dose 2.5mg /day, LD 3/14 (SNF) Warfarin per pharmacy protocol.   PTA abx Azithromycin, Ethambutol, Rifampin: would not expect any additive effect on INR at this point  Today, 11/29/2016 INR 3.69 CBC decreased, Plt wnl No sign of bleed noted  Goal of Therapy:  INR 2-3 Monitor CBC, platelets   Plan:  Daily INR No warfarin today  Otho BellowsGreen, Milika Ventress L PharmD Pager [phone] 11/29/2016, 12:27 PM

## 2016-11-30 LAB — CBC
HEMATOCRIT: 30.7 % — AB (ref 36.0–46.0)
Hemoglobin: 10 g/dL — ABNORMAL LOW (ref 12.0–15.0)
MCH: 32.6 pg (ref 26.0–34.0)
MCHC: 32.6 g/dL (ref 30.0–36.0)
MCV: 100 fL (ref 78.0–100.0)
Platelets: 229 10*3/uL (ref 150–400)
RBC: 3.07 MIL/uL — ABNORMAL LOW (ref 3.87–5.11)
RDW: 14.7 % (ref 11.5–15.5)
WBC: 6.3 10*3/uL (ref 4.0–10.5)

## 2016-11-30 LAB — BASIC METABOLIC PANEL
Anion gap: 4 — ABNORMAL LOW (ref 5–15)
BUN: 12 mg/dL (ref 6–20)
CALCIUM: 7.5 mg/dL — AB (ref 8.9–10.3)
CO2: 26 mmol/L (ref 22–32)
Chloride: 112 mmol/L — ABNORMAL HIGH (ref 101–111)
Creatinine, Ser: 0.65 mg/dL (ref 0.44–1.00)
GFR calc Af Amer: >60 mL/min (ref >60)
GFR calc non Af Amer: >60 mL/min (ref >60)
GLUCOSE: 91 mg/dL (ref 65–99)
Potassium: 3.6 mmol/L (ref 3.5–5.1)
Sodium: 142 mmol/L (ref 135–145)

## 2016-11-30 LAB — URINE CULTURE: CULTURE: NO GROWTH

## 2016-11-30 LAB — PROTIME-INR
INR: 2.42
Prothrombin Time: 26.8 seconds — ABNORMAL HIGH (ref 11.4–15.2)

## 2016-11-30 LAB — VITAMIN B12: Vitamin B-12: 725 pg/mL (ref 180–914)

## 2016-11-30 LAB — TSH: TSH: 1.176 u[IU]/mL (ref 0.350–4.500)

## 2016-11-30 LAB — GLUCOSE, CAPILLARY: GLUCOSE-CAPILLARY: 96 mg/dL (ref 65–99)

## 2016-11-30 MED ORDER — HALOPERIDOL LACTATE 5 MG/ML IJ SOLN
1.0000 mg | Freq: Four times a day (QID) | INTRAMUSCULAR | Status: DC | PRN
  Administered 2016-12-01 – 2016-12-02 (×3): 1 mg via INTRAVENOUS
  Filled 2016-11-30 (×3): qty 1

## 2016-11-30 MED ORDER — WARFARIN - PHARMACIST DOSING INPATIENT
Freq: Every day | Status: DC
  Administered 2016-11-30: 20:00:00

## 2016-11-30 MED ORDER — WARFARIN SODIUM 2 MG PO TABS
2.0000 mg | ORAL_TABLET | Freq: Once | ORAL | Status: AC
  Administered 2016-11-30: 2 mg via ORAL
  Filled 2016-11-30: qty 1

## 2016-11-30 MED ORDER — LORAZEPAM 2 MG/ML IJ SOLN
0.5000 mg | INTRAMUSCULAR | Status: DC | PRN
  Administered 2016-11-30 – 2016-12-01 (×5): 0.5 mg via INTRAVENOUS
  Filled 2016-11-30 (×5): qty 1

## 2016-11-30 NOTE — Progress Notes (Signed)
PROGRESS NOTE    Stacie Wright  QQV:[id number] DOB: 1927-11-25 DOA: 11/27/2016 PCP: Dwana MelenaZack Hall, MD   Brief Narrative: Stacie Wright is a 83 y.o. female with medical history significant for Alzheimer's dementia, bronchiectasis on chronic antibiotic therapy, hypertension, and depression with anxiety who presents from her nursing facility for evaluation of agitation and restlessness. Patient has reportedly been agitated for the past 10 days at her nursing facility, sleeping very little if at all, wandering in and out of other resident's rooms, and interfering with staff as they attend to other residents. She is reportedly quite confused at baseline. There is been no recent fevers or chills reported and patient had a negative urinalysis early in the course of this illness. She had been treated with Xanax and Haldol at the nursing facility, but after telephone consultation with psychiatry, this was changed to low-dose Ativan and scheduled Zyprexa and Depakote. Unfortunately, despite treatment with these agents, the patient's condition has persisted and there is concern that she is posing a risk to her own safety as well as that of the other residents and staff at her facility. There has been no known trauma or fall recently and the patient is not known to use alcohol or illicit substances. She has not been able to express any specific complaints.  Assessment & Plan:   Principal Problem:   Acute encephalopathy Active Problems:   Hypertension   Chronic respiratory failure with hypoxia (HCC)   Depression   Dementia in Alzheimer's disease   Obstructive bronchiectasis (HCC)   Recurrent deep vein thrombosis (DVT) (HCC)   Acute lower UTI   Agitation   Hypokalemia   Macrocytic anemia   Supratherapeutic INR   Coumadin toxicity, accidental or unintentional, initial encounter   Pressure injury of skin   Acute encephalopathy , agitation;  -Pt presents with 10 days of agitation and restlessness at her  nursing home. -Continue Zyprexa, Depakote, and low-dose prn Ativan  Treat for infectious precess.   CT head negative Ammonia , TSH 1.1, B12 pendig continue with  IV fluids to D 5, decreased rate Improved this morning, patient alert, less agitated. She was able to tell me her name. -Patient usually gets agitated during night. I have ordred, PRN IV haldol.  -Will consult Psych to help with medications.   UTI; unable to take oral. Continue with Aztreonam.  Culture growing multiple bacterial morphotype, culture reordered.  WBC trending down.   Bronchiectasis, chronic hypoxic respiratory failure. Continue with oxygen supplementation and IV antibiotics.  Continue with nebulizer.   History of DVT;  supra therapeutic INR. INR trending down.  Coumadin per pharmacy to dose.    Macrocytic anemia ; follow b 112 and folate level.   Hypokalemia; replaced.      DVT prophylaxis: on coumadin.  Code Status:DNR Family Communication: none at bedside.  Disposition Plan: remain inpatient , back to SNF when stable.   Consultants:   none   Procedures; none   Antimicrobials:   Aztreonam 3-16   Subjective: She was very agitated last night. She remove IV Access. She received IM haldol last night. She is sleeping this morning. She would wake up and say few words.     Objective: Vitals:   11/28/16 1331 11/28/16 2116 11/29/16 0640 11/29/16 1347  BP: (!) 128/53 115/72 (!) 148/59 (!) 144/69  Pulse: 60 72 80 66  Resp: 16 18 16 16   Temp: 97.8 F (36.6 C) 97.2 F (36.2 C) 97.6 F (36.4 C) 97.8 F (36.6 C)  TempSrc: Oral Oral Oral Axillary  SpO2: 97% 95% 99% 90%  Weight:   65.9 kg (145 lb 4.5 oz)     Intake/Output Summary (Last 24 hours) at 11/30/16 1125 Last data filed at 11/30/16 7829  Gross per 24 hour  Intake          2976.25 ml  Output              750 ml  Net          2226.25 ml   Filed Weights   11/29/16 0640  Weight: 65.9 kg (145 lb 4.5 oz)     Examination:  General exam: Appears calm and comfortable  Respiratory system: Clear to auscultation. Respiratory effort normal. Cardiovascular system: S1 & S2 heard, RRR. No JVD, murmurs, rubs, gallops or clicks. No pedal edema. Gastrointestinal system: Abdomen is nondistended, soft and nontender. No organomegaly or masses felt. Normal bowel sounds heard. Central nervous system: Sleepy  Extremities: Symmetric 5 x 5 power. Skin: No rashes, lesions or ulcers Psychiatry: Patient was very agitated last night.     Data Reviewed: I have personally reviewed following labs and imaging studies  CBC:  Recent Labs Lab 11/27/16 1810 11/28/16 0553 11/30/16 0758  WBC 14.5* 7.4 6.3  NEUTROABS 11.9* 5.1  --   HGB 11.3* 9.5* 10.0*  HCT 34.9* 29.9* 30.7*  MCV 101.2* 99.0 100.0  PLT 324 250 229   Basic Metabolic Panel:  Recent Labs Lab 11/27/16 1810 11/28/16 0553 11/30/16 0758  NA 139 141 142  K 3.2* 3.5 3.6  CL 98* 103 112*  CO2 30 33* 26  GLUCOSE 110* 92 91  BUN 26* 24* 12  CREATININE 0.99 0.99 0.65  CALCIUM 8.6* 8.2* 7.5*   GFR: Estimated Creatinine Clearance: 50.9 mL/min (by C-G formula based on SCr of 0.65 mg/dL). Liver Function Tests:  Recent Labs Lab 11/27/16 1810  AST 28  ALT 21  ALKPHOS 48  BILITOT 1.1  PROT 7.0  ALBUMIN 3.5   No results for input(s): LIPASE, AMYLASE in the last 168 hours. No results for input(s): AMMONIA in the last 168 hours. Coagulation Profile:  Recent Labs Lab 11/27/16 1810 11/28/16 0553 11/29/16 0633 11/30/16 0524  INR 4.35* 4.39* 3.69 2.42   Cardiac Enzymes: No results for input(s): CKTOTAL, CKMB, CKMBINDEX, TROPONINI in the last 168 hours. BNP (last 3 results) No results for input(s): PROBNP in the last 8760 hours. HbA1C: No results for input(s): HGBA1C in the last 72 hours. CBG:  Recent Labs Lab 11/28/16 0743 11/28/16 1131 11/29/16 0739 11/30/16 0803  GLUCAP 80 88 108* 96   Lipid Profile: No results for  input(s): CHOL, HDL, LDLCALC, TRIG, CHOLHDL, LDLDIRECT in the last 72 hours. Thyroid Function Tests:  Recent Labs  11/30/16 0758  TSH 1.176   Anemia Panel: No results for input(s): VITAMINB12, FOLATE, FERRITIN, TIBC, IRON, RETICCTPCT in the last 72 hours. Sepsis Labs: No results for input(s): PROCALCITON, LATICACIDVEN in the last 168 hours.  Recent Results (from the past 240 hour(s))  Urine culture     Status: Abnormal   Collection Time: 11/27/16  6:02 PM  Result Value Ref Range Status   Specimen Description URINE, CLEAN CATCH  Final   Special Requests Normal  Final   Culture MULTIPLE SPECIES PRESENT, SUGGEST RECOLLECTION (A)  Final   Report Status 11/29/2016 FINAL  Final         Radiology Studies: Ct Head Wo Contrast  Result Date: 11/28/2016 CLINICAL DATA:  Confusion. EXAM: CT HEAD WITHOUT  CONTRAST TECHNIQUE: Contiguous axial images were obtained from the base of the skull through the vertex without intravenous contrast. COMPARISON:  11/30/2015 FINDINGS: Brain: There is no evidence of acute cortical infarct, intracranial hemorrhage, mass, midline shift, or extra-axial fluid collection. Cerebral atrophy is unchanged. Deep cerebral white matter hypodensities are stable to slightly more prominent than on the prior study and nonspecific but compatible with chronic small vessel ischemic disease, relatively mild for age. Vascular: Calcified atherosclerosis at the skullbase. No hyperdense vessel. Skull: No fracture or focal osseous lesion. Sinuses/Orbits: Visualized paranasal sinuses and mastoid air cells are clear. Bilateral cataract extraction. Other: None. IMPRESSION: 1. No evidence of acute intracranial abnormality. 2. Mild chronic small vessel ischemic disease. Electronically Signed   By: Sebastian Ache M.D.   On: 11/28/2016 15:05        Scheduled Meds: . azithromycin  500 mg Oral Once per day on Mon Wed Fri  . aztreonam  1 g Intravenous Q8H  . divalproex  250 mg Oral BID  .  dorzolamide  1 drop Both Eyes BID  . ethambutol  1,600 mg Oral Once per day on Mon Wed Fri  . latanoprost  1 drop Both Eyes QHS  . metoprolol tartrate  25 mg Oral BID  . OLANZapine zydis  5 mg Oral BID  . PARoxetine  20 mg Oral Daily  . rifampin  600 mg Oral Once per day on Mon Wed Fri  . warfarin  2 mg Oral ONCE-1800  . Warfarin - Pharmacist Dosing Inpatient   Does not apply q1800   Continuous Infusions: . dextrose 5 % and 0.9% NaCl 75 mL/hr at 11/30/16 0756     LOS: 3 days    Time spent: 35 minutes.     Alba Cory, MD [facility] Pager [phone]  If 7PM-7AM, please contact night-coverage [email or web address] Password [facility] 11/30/2016, 11:25 AM

## 2016-11-30 NOTE — Progress Notes (Signed)
ANTICOAGULATION CONSULT NOTE   Pharmacy Consult for warfarin Indication: DVT  Allergies  Allergen Reactions  . Amoxicillin Anaphylaxis and Rash  . Darvocet [Propoxyphene N-Acetaminophen] Other (See Comments)    hallucinations  . Penicillins Anaphylaxis and Rash    Has patient had a PCN reaction causing immediate rash, facial/tongue/throat swelling, SOB or lightheadedness with hypotension: Yes Has patient had a PCN reaction causing severe rash involving mucus membranes or skin necrosis: No Has patient had a PCN reaction that required hospitalization No Has patient had a PCN reaction occurring within the last 10 years: No If all of the above answers are "NO", then may proceed with Cephalosporin use.   . Sulfa Antibiotics Anaphylaxis and Rash  . Propoxyphene Other (See Comments)    hallucinations   Patient Measurements: Weight: 145 lb 4.5 oz (65.9 kg)  Vital Signs:    Labs:  Recent Labs  11/27/16 1810 11/28/16 0553 11/29/16 0633 11/30/16 0524  HGB 11.3* 9.5*  --   --   HCT 34.9* 29.9*  --   --   PLT 324 250  --   --   LABPROT 43.2* 43.5* 37.5* 26.8*  INR 4.35* 4.39* 3.69 2.42  CREATININE 0.99 0.99  --   --    Estimated Creatinine Clearance: 41.1 mL/min (by C-G formula based on SCr of 0.99 mg/dL).  Medical History: Past Medical History:  Diagnosis Date  . Alzheimer's dementia   . Anxiety   . Arthritis   . Bronchiectasis (HCC) 2014  . COPD (chronic obstructive pulmonary disease) (HCC)   . DVT (deep venous thrombosis) (HCC) 2008   LLE  . Gout   . Headache(784.0)   . Hypertension   . IBS (irritable bowel syndrome)   . MAI (mycobacterium avium-intracellulare) infection (HCC) 2014  . Mold exposure 06/20/2015  . Pneumonia 04/2013   Medications:  Prescriptions Prior to Admission  Medication Sig Dispense Refill Last Dose  . acetaminophen (TYLENOL) 500 MG tablet Take 500 mg by mouth every 6 (six) hours as needed (pain).   11/19/2016  . ALPRAZolam (XANAX) 0.5 MG tablet  Take 0.5 mg by mouth every 6 (six) hours as needed for anxiety.   11/24/2016  . azithromycin (ZITHROMAX) 500 MG tablet Take 500 mg by mouth 3 (three) times a week. Monday, Wednesday, & Friday   11/26/2016 at Unknown time  . divalproex (DEPAKOTE SPRINKLE) 125 MG capsule Take 250 mg by mouth 2 (two) times daily.   11/26/2016 at Unknown time  . dorzolamide (TRUSOPT) 2 % ophthalmic solution Place 1 drop into both eyes 2 (two) times daily.   11/27/2016 at Unknown time  . ethambutol (MYAMBUTOL) 400 MG tablet Take 1,600 mg by mouth 3 (three) times a week. Monday, Wednesday, & Friday   11/26/2016 at Unknown time  . haloperidol (HALDOL) 0.5 MG tablet Take 0.5 mg by mouth 2 (two) times daily.   11/27/2016 at Unknown time  . ipratropium-albuterol (DUONEB) 0.5-2.5 (3) MG/3ML SOLN Take 3 mLs by nebulization every 6 (six) hours as needed (SOB).   11/19/2016  . latanoprost (XALATAN) 0.005 % ophthalmic solution Place 1 drop into both eyes at bedtime.   11/26/2016 at Unknown time  . metoprolol tartrate (LOPRESSOR) 25 MG tablet Take 25 mg by mouth 2 (two) times daily.    11/27/2016 at 0900  . PARoxetine (PAXIL) 20 MG tablet Take 20 mg by mouth daily.   11/26/2016 at Unknown time  . rifampin (RIFADIN) 300 MG capsule Take 600 mg by mouth 3 (three) times a week.  Monday, Wednesday, & Friday   11/26/2016 at Unknown time  . spironolactone (ALDACTONE) 25 MG tablet Take 25 mg by mouth daily.   11/27/2016 at Unknown time  . warfarin (COUMADIN) 2.5 MG tablet Take 2.5 mg by mouth one time only at 6 PM.    11/26/2016 at 1800  . levofloxacin (LEVAQUIN) 750 MG tablet Take 1 tablet (750 mg total) by mouth every other day. (Patient not taking: Reported on 11/27/2016) 3 tablet 0 Completed Course at Unknown time  . predniSONE (DELTASONE) 50 MG tablet Take one tablet by mouth once a day (Patient not taking: Reported on 11/27/2016) 3 tablet 0 Not Taking at Unknown time   Scheduled:  . azithromycin  500 mg Oral Once per day on Mon Wed Fri  . aztreonam   1 g Intravenous Q8H  . divalproex  250 mg Oral BID  . dorzolamide  1 drop Both Eyes BID  . ethambutol  1,600 mg Oral Once per day on Mon Wed Fri  . latanoprost  1 drop Both Eyes QHS  . metoprolol tartrate  25 mg Oral BID  . OLANZapine zydis  5 mg Oral BID  . PARoxetine  20 mg Oral Daily  . rifampin  600 mg Oral Once per day on Mon Wed Fri   Assessment: Patient to ED 3/15 with acute encephalopathy, 10 day hx of agitation, restlessness at SNF)  History of DVT on chronic warfarin.  INR on admit > 4. Home dose 2.5mg /day, LD 3/14 (SNF) Warfarin per pharmacy protocol.    Today, 11/30/2016 INR 2.42 CBC decreased, Plt wnl (3/16) No sign of bleed noted Azithromycin and rifampin can increase the effects of warfarin  Goal of Therapy:  INR 2-3 Monitor CBC, platelets   Plan:  Warfarin 2mg  po x 1 at 1800 Daily INR   Arley Phenix RPh 11/30/2016, 9:45 AM Pager [phone]

## 2016-11-30 NOTE — Consult Note (Signed)
Attempts made to interview patient unsuccessful due to patient being heavily sedated. Pager# [phone] was paged but no response received. Re-consult psych service when patient is awake. Thedore MinsMojeed Chanceler Pullin, MD

## 2016-12-01 DIAGNOSIS — F0390 Unspecified dementia without behavioral disturbance: Secondary | ICD-10-CM

## 2016-12-01 DIAGNOSIS — Z79899 Other long term (current) drug therapy: Secondary | ICD-10-CM

## 2016-12-01 LAB — PROTIME-INR
INR: 2.8
Prothrombin Time: 30.1 seconds — ABNORMAL HIGH (ref 11.4–15.2)

## 2016-12-01 LAB — GLUCOSE, CAPILLARY: Glucose-Capillary: 90 mg/dL (ref 65–99)

## 2016-12-01 MED ORDER — LORAZEPAM 0.5 MG PO TABS
0.5000 mg | ORAL_TABLET | Freq: Four times a day (QID) | ORAL | Status: DC | PRN
  Administered 2016-12-01: 0.5 mg via ORAL
  Filled 2016-12-01: qty 1

## 2016-12-01 MED ORDER — WARFARIN SODIUM 1 MG PO TABS
1.0000 mg | ORAL_TABLET | Freq: Once | ORAL | Status: AC
  Administered 2016-12-01: 1 mg via ORAL
  Filled 2016-12-01: qty 1

## 2016-12-01 NOTE — Consult Note (Signed)
[facility] Consult   Reason for Consult:  Dementia with agitation Referring Physician:  Dr. Tyrell Antonio Patient Identification: Stacie Wright MRN:  [id number] Principal Diagnosis: Acute encephalopathy Diagnosis:   Patient Active Problem List   Diagnosis Date Noted  . Acute encephalopathy [G93.40] 11/28/2016  . Pressure injury of skin [L89.90] 11/28/2016  . Coumadin toxicity, accidental or unintentional, initial encounter [T45.511A]   . Acute lower UTI [N39.0] 11/27/2016  . Agitation [R45.1] 11/27/2016  . Hypokalemia [E87.6] 11/27/2016  . Macrocytic anemia [D53.9] 11/27/2016  . Supratherapeutic INR [R79.1] 11/27/2016  . Hx of gout [Z87.39] 11/20/2016  . Recurrent deep vein thrombosis (DVT) ([place]) [I82.409] 11/18/2016  . HCAP (healthcare-associated pneumonia) [J18.9]   . Respiratory failure ([place]) [J96.90] 11/10/2016  . Pneumonia of right lung due to infectious organism [J18.9]   . Lactic acidosis [E87.2]   . Sepsis ([place]) [A41.9]   . Obstructive bronchiectasis ([place]) [J47.1] 11/01/2015  . Bronchiectasis ([place]) [J47.9] 10/11/2015  . Acute respiratory failure with hypoxia ([place]) [J96.01] 10/11/2015  . Acute exacerbation of bronchiectasis ([place]) [J47.1] 10/11/2015  . Mold exposure [T06.269] 06/20/2015  . Generalized anxiety disorder [F41.1] 01/19/2015  . Depression [F32.9] 01/19/2015  . Dementia in Alzheimer's disease [G30.9, F02.80] 01/19/2015  . Mycobacterium avium-intracellulare infection ([place]) [A31.0] 01/18/2015  . Influenza with pneumonia [J11.00] 01/15/2015  . Cellulitis [L03.90] 06/09/2013  . Infected hardware in left leg ([place]) [T84.7XXA] 06/09/2013  . Constipation [K59.00] 04/27/2013  . Pneumonia, organism unspecified(486) [J18.9] 04/26/2013  . COPD exacerbation ([place]) [J44.1] 04/26/2013  . Bronchiectasis without acute exacerbation ([place]) [J47.9] 04/26/2013  . Pulmonary nodule [R91.1] 04/26/2013  . Acute bronchitis [J20.9] 04/25/2013  . Generalized weakness  [R53.1] 04/25/2013  . Chronic respiratory failure with hypoxia ([place]) [J96.11] 04/25/2013  . Hypertension [I10]     Total Time spent with patient: 45 minutes  Subjective:   Stacie Wright is a 83 y.o. female patient admitted with Dementia, restlessness and agitation  HPI:  Stacie Wright is a 83 y.o. female, seen, chart reviewed and case discussed with LCSW. Patient appeared lying in her bed, awake, alert oriented to herself only. Patient is a poor historian and does not remember where she has been and does not recognize that she has been placed in hospital for agitation and aggressive behavior. Patient has no family members at bedside. Patient has significant cognitive decline especially immediate and recent but intact long-term memory especially regarding her family members and their names. Patient has been diagnosed with dementia and has been placed at [facility], and also reportedly will be going to [facility] for rehabilitation. Patient has been receiving medication Depakote sprinkle 250 mg twice daily, Zyprexa, 5 mg twice daily and Paxil 20 mg daily on scheduled dose and and lorazepam as needed.  reportedly at nursing facility patient has been agitated for the past 10 days and not sleeping well reportedly wandering in and out of the restaurant rooms and interfering with staff. Staff reported she is confused at baseline. Reportedly patient received Haldol and Xanax at nursing facility and recently changed to Zyprexa, Depakote and low-dose Ativan   Past Psychiatric History:  Dementia with behavioral disturbance.  Risk to Self: Suicidal Ideation:  (unk) Suicidal Intent:  (unk) Is patient at risk for suicide?: No Suicidal Plan?:  (unk) Specify Current Suicidal Plan:  (unk) Access to Means:  (unk) What has been your use of drugs/alcohol within the last 12 months?:  (unk) How many times?:  (unk) Other Self Harm Risks:  (unk) Triggers for Past Attempts:  Unknown Intentional Self Injurious  Behavior:  (unk) Risk to Others: Homicidal Ideation:  (unk) Thoughts of Harm to Others:  (unk) Current Homicidal Intent:  (unk) Current Homicidal Plan:  (unk) Access to Homicidal Means:  (unk) Identified Victim:  (unk) History of harm to others?:  (unk) Assessment of Violence:  (patient reportedly uncooperative at SNF) Violent Behavior Description:  (calm and cooperative ) Does patient have access to weapons?:  (unk) Criminal Charges Pending?:  (unk) Does patient have a court date:  (unk) Prior Inpatient Therapy: Prior Inpatient Therapy: Yes Prior Therapy Dates:  (unk) Prior Therapy Facilty/Provider(s):  (unk) Reason for Treatment:  (unk) Prior Outpatient Therapy: Prior Outpatient Therapy: No Prior Therapy Dates:  (n/a) Prior Therapy Facilty/Provider(s):  (n/a) Reason for Treatment:  (n/a) Does patient have an ACCT team?: Unknown Does patient have Intensive In-House Services?  : Unknown Does patient have [facility] services? : Unknown Does patient have P4CC services?: Unknown  Past Medical History:  Past Medical History:  Diagnosis Date  . Alzheimer's dementia   . Anxiety   . Arthritis   . Bronchiectasis ([place]) 2014  . COPD (chronic obstructive pulmonary disease) ([place])   . DVT (deep venous thrombosis) ([place]) 2008   LLE  . Gout   . Headache(784.0)   . Hypertension   . IBS (irritable bowel syndrome)   . MAI (mycobacterium avium-intracellulare) infection ([place]) 2014  . Mold exposure 06/20/2015  . Pneumonia 04/2013    Past Surgical History:  Procedure Laterality Date  . ABDOMINAL HYSTERECTOMY    . CATARACT EXTRACTION    . CHOLECYSTECTOMY    . FRACTURE SURGERY     Family History:  Family History  Problem Relation Age of Onset  . Diabetes Father   . Hypertension Father   . Rheum arthritis Father   . Lung cancer Sister     smoked   Family Psychiatric  History: Noncontributory Social History:  History  Alcohol Use No     History  Drug Use No    Social History    Social History  . Marital status: Married    Spouse name: N/A  . Number of children: N/A  . Years of education: N/A   Occupational History  . Retired    Social History Main Topics  . Smoking status: Never Smoker  . Smokeless tobacco: Never Used  . Alcohol use No  . Drug use: No  . Sexual activity: Not Currently   Other Topics Concern  . None   Social History Narrative  . None   Additional Social History:    Allergies:   Allergies  Allergen Reactions  . Amoxicillin Anaphylaxis and Rash  . Darvocet [Propoxyphene N-Acetaminophen] Other (See Comments)    hallucinations  . Penicillins Anaphylaxis and Rash    Has patient had a PCN reaction causing immediate rash, facial/tongue/throat swelling, SOB or lightheadedness with hypotension: Yes Has patient had a PCN reaction causing severe rash involving mucus membranes or skin necrosis: No Has patient had a PCN reaction that required hospitalization No Has patient had a PCN reaction occurring within the last 10 years: No If all of the above answers are "NO", then may proceed with Cephalosporin use.   . Sulfa Antibiotics Anaphylaxis and Rash  . Propoxyphene Other (See Comments)    hallucinations    Labs:  Results for orders placed or performed during the hospital encounter of 11/27/16 (from the past 48 hour(s))  Urine culture     Status: None   Collection Time: 11/29/16  1:38  PM  Result Value Ref Range   Specimen Description URINE, CLEAN CATCH    Special Requests NONE    Culture      NO GROWTH Performed at [facility], [address] [place], [place] [address]    Report Status 11/30/2016 FINAL   Protime-INR     Status: Abnormal   Collection Time: 11/30/16  5:24 AM  Result Value Ref Range   Prothrombin Time 26.8 (H) 11.4 - 15.2 seconds   INR 2.42   CBC     Status: Abnormal   Collection Time: 11/30/16  7:58 AM  Result Value Ref Range   WBC 6.3 4.0 - 10.5 K/uL   RBC 3.07 (L) 3.87 - 5.11 MIL/uL   Hemoglobin  10.0 (L) 12.0 - 15.0 g/dL   HCT 30.7 (L) 36.0 - 46.0 %   MCV 100.0 78.0 - 100.0 fL   MCH 32.6 26.0 - 34.0 pg   MCHC 32.6 30.0 - 36.0 g/dL   RDW 14.7 11.5 - 15.5 %   Platelets 229 150 - 400 K/uL  Basic metabolic panel     Status: Abnormal   Collection Time: 11/30/16  7:58 AM  Result Value Ref Range   Sodium 142 135 - 145 mmol/L   Potassium 3.6 3.5 - 5.1 mmol/L   Chloride 112 (H) 101 - 111 mmol/L   CO2 26 22 - 32 mmol/L   Glucose, Bld 91 65 - 99 mg/dL   BUN 12 6 - 20 mg/dL   Creatinine, Ser 0.65 0.44 - 1.00 mg/dL   Calcium 7.5 (L) 8.9 - 10.3 mg/dL   GFR calc non Af Amer >60 >60 mL/min   GFR calc Af Amer >60 >60 mL/min    Comment: (NOTE) The eGFR has been calculated using the CKD EPI equation. This calculation has not been validated in all clinical situations. eGFR's persistently <60 mL/min signify possible Chronic Kidney Disease.    Anion gap 4 (L) 5 - 15  Vitamin B12     Status: None   Collection Time: 11/30/16  7:58 AM  Result Value Ref Range   Vitamin B-12 725 180 - 914 pg/mL    Comment: (NOTE) This assay is not validated for testing neonatal or myeloproliferative syndrome specimens for Vitamin B12 levels. Performed at [facility], [place] [address] [place], [place] [address]   TSH     Status: None   Collection Time: 11/30/16  7:58 AM  Result Value Ref Range   TSH 1.176 0.350 - 4.500 uIU/mL    Comment: Performed by a 3rd Generation assay with a functional sensitivity of <=0.01 uIU/mL.  Glucose, capillary     Status: None   Collection Time: 11/30/16  8:03 AM  Result Value Ref Range   Glucose-Capillary 96 65 - 99 mg/dL  Protime-INR     Status: Abnormal   Collection Time: 12/01/16  5:31 AM  Result Value Ref Range   Prothrombin Time 30.1 (H) 11.4 - 15.2 seconds   INR 2.80   Glucose, capillary     Status: None   Collection Time: 12/01/16 10:32 AM  Result Value Ref Range   Glucose-Capillary 90 65 - 99 mg/dL    Current Facility-Administered Medications   Medication Dose Route Frequency Provider Last Rate Last Dose  . acetaminophen (TYLENOL) tablet 500 mg  500 mg Oral Q6H PRN Orlie Dakin, MD   500 mg at 12/01/16 0222  . azithromycin (ZITHROMAX) tablet 500 mg  500 mg Oral Once per day on  Mon Wed Fri Orlie Dakin, MD   500 mg at 12/01/16 1055  . aztreonam (AZACTAM) 1 GM IVPB  1 g Intravenous Q8H Belkys A Regalado, MD   1 g at 12/01/16 1117  . dextrose 5 %-0.9 % sodium chloride infusion   Intravenous Continuous Belkys A Regalado, MD 50 mL/hr at 12/01/16 0125    . divalproex (DEPAKOTE SPRINKLE) capsule 250 mg  250 mg Oral BID Orlie Dakin, MD   250 mg at 12/01/16 1034  . dorzolamide (TRUSOPT) 2 % ophthalmic solution 1 drop  1 drop Both Eyes BID Orlie Dakin, MD   1 drop at 12/01/16 1000  . ethambutol (MYAMBUTOL) tablet 1,600 mg  1,600 mg Oral Once per day on Mon Wed Fri Sam Jacubowitz, MD   1,600 mg at 12/01/16 1037  . haloperidol lactate (HALDOL) injection 1 mg  1 mg Intravenous Q6H PRN Belkys A Regalado, MD      . ipratropium-albuterol (DUONEB) 0.5-2.5 (3) MG/3ML nebulizer solution 3 mL  3 mL Nebulization Q6H PRN Ilene Qua Opyd, MD      . latanoprost (XALATAN) 0.005 % ophthalmic solution 1 drop  1 drop Both Eyes QHS Orlie Dakin, MD   1 drop at 11/30/16 2200  . LORazepam (ATIVAN) injection 0.5 mg  0.5 mg Intravenous Q4H PRN Ankit Arsenio Loader, MD   0.5 mg at 12/01/16 0214  . metoprolol tartrate (LOPRESSOR) tablet 25 mg  25 mg Oral BID Orlie Dakin, MD   25 mg at 12/01/16 1034  . OLANZapine zydis (ZYPREXA) disintegrating tablet 5 mg  5 mg Oral BID Orlie Dakin, MD   5 mg at 12/01/16 1035  . ondansetron (ZOFRAN) tablet 4 mg  4 mg Oral Q6H PRN Vianne Bulls, MD       Or  . ondansetron (ZOFRAN) injection 4 mg  4 mg Intravenous Q6H PRN Vianne Bulls, MD      . PARoxetine (PAXIL) tablet 20 mg  20 mg Oral Daily Orlie Dakin, MD   20 mg at 12/01/16 1034  . rifampin (RIFADIN) capsule 600 mg  600 mg Oral Once per day on Mon Wed Fri Orlie Dakin, MD   600 mg at 12/01/16 1035  . warfarin (COUMADIN) tablet 1 mg  1 mg Oral ONCE-1800 Thomes Lolling, [facility]      . Warfarin - Pharmacist Dosing Inpatient   Does not apply q1800 Elmarie Shiley, MD        Musculoskeletal: Strength & Muscle Tone: decreased Gait & Station: unable to stand Patient leans: N/A  Psychiatric Specialty Exam: Physical Exam as per history and physical   ROS unable to complete because of poor cognitions and unable historian.   Blood pressure (!) 165/72, pulse 64, temperature 98 F (36.7 C), temperature source Axillary, resp. rate 18, weight 72.1 kg (158 lb 15.2 oz), SpO2 95 %.Body mass index is 24.17 kg/m.  General Appearance: Guarded  Eye Contact:  Good  Speech:  Blocked and Slurred  Volume:  Decreased  Mood:  Anxious and Depressed  Affect:  Constricted and Depressed  Thought Process:  Disorganized and Irrelevant  Orientation:  Full (Time, Place, and Person)  Thought Content:  Rumination  Suicidal Thoughts:  No  Homicidal Thoughts:  No  Memory:  Immediate;   Fair Recent;   Fair Remote;   Good  Judgement:  Impaired  Insight:  Lacking  Psychomotor Activity:  Decreased  Concentration:  Concentration: Fair and Attention Span: Poor  Recall:  Poor  Fund of Knowledge:  Fair  Language:  Fair  Akathisia:  Negative  Handed:  Right  AIMS (if indicated):     Assets:  Communication Skills Desire for Improvement Financial Resources/Insurance Housing Leisure Time Social Support Transportation  ADL's:  Impaired  Cognition:  Impaired,  Severe  Sleep:        Treatment Plan Summary: 83 years old female with dementia and recently increased agitation and restless behavior and also changes of medication appreciated. Patient does not appear to be responding to internal stimuli and is a poor historian. Patient is compliant with her medication and needed medication adjustment for better control of behaviors.    Case discussed with the unit social  service who has been in contact with patient and disposition plans of sending her back to the nursing facility when medically stable. Recommendation: Continue Zyprexa 5 mg twice daily for agitation Continue Paxil 20 mg daily for depression Continue lorazepam 0.5 mg every 4 hours as needed for restlessness Increase Depakote sprinkles 250 mg 3 times daily for behaviors and insomnia  Review of labs indicated patient the valproic acid level is less than 10  Monitor for the therapeutic level and adjust as clinically required.   Disposition: Patient will be better served at the skilled nursing facility with the memory unit. No evidence of imminent risk to self or others at present.   Supportive therapy provided about ongoing stressors.  Ambrose Finland, MD 12/01/2016 12:04 PM

## 2016-12-01 NOTE — Progress Notes (Addendum)
Psychiatrist will help determine disposition.  [facility] Patient was in transition to return to ALF. L[facility]WA [facility]. She reports agitation and confusion is patient baseline. The facility is agreeable to take patient. She will need to be without a Recruitment consultantsafety sitter for 24 hours.   Patient will  need PTconsult to help determine ability to ambulate.

## 2016-12-01 NOTE — Care Management Important Message (Signed)
Important Message  Patient Details  Name: Stacie Wright MRN: [id number] Date of Birth: 1928/02/15   Medicare Important Message Given:  Yes    Caren MacadamFuller, Arilyn Brierley 12/01/2016, 12:58 PMImportant Message  Patient Details  Name: Stacie CourtDorothy H Wright MRN: [id number] Date of Birth: 1928/02/15   Medicare Important Message Given:  Yes    Caren MacadamFuller, Yasuo Phimmasone 12/01/2016, 12:58 PM

## 2016-12-01 NOTE — Progress Notes (Signed)
Pharmacy Antibiotic Note  [name] is a 83 y.o. female admitted on 11/27/2016 with UTI.  She continues on day#5 Aztreonam per pharmacy dosing.  12/01/2016  Afebrile  WBC wnl  Renal function stable  UCx negative  Plan: Aztreonam 1g IV q8h. Consider de-escalate/change to PO once appropriate.   *Note: patient has tolerated cephalosporins in the past. No dose adjustments anticipated.  Pharmacy to sign off.  Please re-consult if needed.   Weight: 158 lb 15.2 oz (72.1 kg)  Temp (24hrs), Avg:97.7 F (36.5 C), Min:97.4 F (36.3 C), Max:98 F (36.7 C)   Recent Labs Lab 11/27/16 1810 11/28/16 0553 11/30/16 0758  WBC 14.5* 7.4 6.3  CREATININE 0.99 0.99 0.65    Estimated Creatinine Clearance: 50.9 mL/min (by C-G formula based on SCr of 0.65 mg/dL).    Allergies  Allergen Reactions  . Amoxicillin Anaphylaxis and Rash  . Darvocet [Propoxyphene N-Acetaminophen] Other (See Comments)    hallucinations  . Penicillins Anaphylaxis and Rash    Has patient had a PCN reaction causing immediate rash, facial/tongue/throat swelling, SOB or lightheadedness with hypotension: Yes Has patient had a PCN reaction causing severe rash involving mucus membranes or skin necrosis: No Has patient had a PCN reaction that required hospitalization No Has patient had a PCN reaction occurring within the last 10 years: No If all of the above answers are "NO", then may proceed with Cephalosporin use.   . Sulfa Antibiotics Anaphylaxis and Rash  . Propoxyphene Other (See Comments)    hallucinations    Antimicrobials this admission: 3/15 aztreonam >>  Dose adjustments this admission: -  Microbiology results: 3/15 UCx: multiple spec> suggest recollect 3/17 UCx: NG-F  Thank you for allowing pharmacy to be a part of this patient's care.  Junita PushMichelle Wanetta Funderburke, PharmD, BCPS Pager: [phone] 12/01/2016 8:46 AM

## 2016-12-01 NOTE — Progress Notes (Signed)
PROGRESS NOTE    Stacie Wright  ZOX:[id number] DOB: 05-17-1928 DOA: 11/27/2016 PCP: Dwana Melena, MD   Brief Narrative: Stacie Wright is a 83 y.o. female with medical history significant for Alzheimer's dementia, bronchiectasis on chronic antibiotic therapy, hypertension, and depression with anxiety who presents from her nursing facility for evaluation of agitation and restlessness. Patient has reportedly been agitated for the past 10 days at her nursing facility, sleeping very little if at all, wandering in and out of other resident's rooms, and interfering with staff as they attend to other residents. She is reportedly quite confused at baseline. There is been no recent fevers or chills reported and patient had a negative urinalysis early in the course of this illness. She had been treated with Xanax and Haldol at the nursing facility, but after telephone consultation with psychiatry, this was changed to low-dose Ativan and scheduled Zyprexa and Depakote. Unfortunately, despite treatment with these agents, the patient's condition has persisted and there is concern that she is posing a risk to her own safety as well as that of the other residents and staff at her facility. There has been no known trauma or fall recently and the patient is not known to use alcohol or illicit substances. She has not been able to express any specific complaints.  Assessment & Plan:   Principal Problem:   Acute encephalopathy Active Problems:   Hypertension   Chronic respiratory failure with hypoxia (HCC)   Depression   Dementia in Alzheimer's disease   Obstructive bronchiectasis (HCC)   Recurrent deep vein thrombosis (DVT) (HCC)   Acute lower UTI   Agitation   Hypokalemia   Macrocytic anemia   Supratherapeutic INR   Coumadin toxicity, accidental or unintentional, initial encounter   Pressure injury of skin   Acute encephalopathy, agitation;  -Pt presents with 10 days of agitation and restlessness at her  nursing home. -Continue Zyprexa, Depakote, and low-dose prn Ativan  Treat for infectious precess.   CT head negative Ammonia , TSH 1.1, B12 725 continue with  IV fluids to D 5, decreased rate -Patient usually gets agitated during night. I have ordred, PRN IV haldol.  - Psych consulted  to help with medications.  She is more calm, alert, asking to go home.   UTI; unable to take oral. Continue with Aztreonam.  Culture growing multiple bacterial morphotype, culture reordered.  WBC trending down.  Discharge on keflex.   Bronchiectasis, chronic hypoxic respiratory failure. Continue with oxygen supplementation and IV antibiotics.  Continue with nebulizer.   History of DVT;  supra therapeutic INR. INR trending down.  Coumadin per pharmacy to dose.    Macrocytic anemia ; follow b 112 and folate level.   Hypokalemia; replaced.      DVT prophylaxis: on coumadin.  Code Status:DNR Family Communication: none at bedside.  Disposition Plan: remain inpatient , back to SNF when stable.   Consultants:   none   Procedures; none   Antimicrobials:   Aztreonam 3-16   Subjective: Alert, asking if she can go home.  She has been coughing some     Objective: Vitals:   12/01/16 0235 12/01/16 0601 12/01/16 1033 12/01/16 1514  BP: (!) 145/59  (!) 165/72 (!) 167/61  Pulse: 68  64 66  Resp: 18   18  Temp: 98 F (36.7 C)   97.6 F (36.4 C)  TempSrc: Axillary   Oral  SpO2: 95%   97%  Weight:  72.1 kg (158 lb 15.2 oz)  Intake/Output Summary (Last 24 hours) at 12/01/16 1640 Last data filed at 12/01/16 1515  Gross per 24 hour  Intake              100 ml  Output              550 ml  Net             -450 ml   Filed Weights   11/29/16 0640 11/30/16 1543 12/01/16 0601  Weight: 65.9 kg (145 lb 4.5 oz) 69.8 kg (153 lb 14.1 oz) 72.1 kg (158 lb 15.2 oz)    Examination:  General exam: Appears calm and comfortable  Respiratory system: Respiratory effort normal. Bilateral  ronchus.  Cardiovascular system: S1 & S2 heard, RRR. No JVD, murmurs, rubs, gallops or clicks. No pedal edema. Gastrointestinal system: Abdomen is nondistended, soft and nontender. No organomegaly or masses felt. Normal bowel sounds heard. Central nervous system: Sleepy  Extremities: Symmetric 5 x 5 power. Skin: No rashes, lesions or ulcers Psychiatry:calm this morning.     Data Reviewed: I have personally reviewed following labs and imaging studies  CBC:  Recent Labs Lab 11/27/16 1810 11/28/16 0553 11/30/16 0758  WBC 14.5* 7.4 6.3  NEUTROABS 11.9* 5.1  --   HGB 11.3* 9.5* 10.0*  HCT 34.9* 29.9* 30.7*  MCV 101.2* 99.0 100.0  PLT 324 250 229   Basic Metabolic Panel:  Recent Labs Lab 11/27/16 1810 11/28/16 0553 11/30/16 0758  NA 139 141 142  K 3.2* 3.5 3.6  CL 98* 103 112*  CO2 30 33* 26  GLUCOSE 110* 92 91  BUN 26* 24* 12  CREATININE 0.99 0.99 0.65  CALCIUM 8.6* 8.2* 7.5*   GFR: Estimated Creatinine Clearance: 50.9 mL/min (by C-G formula based on SCr of 0.65 mg/dL). Liver Function Tests:  Recent Labs Lab 11/27/16 1810  AST 28  ALT 21  ALKPHOS 48  BILITOT 1.1  PROT 7.0  ALBUMIN 3.5   No results for input(s): LIPASE, AMYLASE in the last 168 hours. No results for input(s): AMMONIA in the last 168 hours. Coagulation Profile:  Recent Labs Lab 11/27/16 1810 11/28/16 0553 11/29/16 0633 11/30/16 0524 12/01/16 0531  INR 4.35* 4.39* 3.69 2.42 2.80   Cardiac Enzymes: No results for input(s): CKTOTAL, CKMB, CKMBINDEX, TROPONINI in the last 168 hours. BNP (last 3 results) No results for input(s): PROBNP in the last 8760 hours. HbA1C: No results for input(s): HGBA1C in the last 72 hours. CBG:  Recent Labs Lab 11/28/16 0743 11/28/16 1131 11/29/16 0739 11/30/16 0803 12/01/16 1032  GLUCAP 80 88 108* 96 90   Lipid Profile: No results for input(s): CHOL, HDL, LDLCALC, TRIG, CHOLHDL, LDLDIRECT in the last 72 hours. Thyroid Function Tests:  Recent  Labs  11/30/16 0758  TSH 1.176   Anemia Panel:  Recent Labs  11/30/16 0758  VITAMINB12 725   Sepsis Labs: No results for input(s): PROCALCITON, LATICACIDVEN in the last 168 hours.  Recent Results (from the past 240 hour(s))  Urine culture     Status: Abnormal   Collection Time: 11/27/16  6:02 PM  Result Value Ref Range Status   Specimen Description URINE, CLEAN CATCH  Final   Special Requests Normal  Final   Culture MULTIPLE SPECIES PRESENT, SUGGEST RECOLLECTION (A)  Final   Report Status 11/29/2016 FINAL  Final  Urine culture     Status: None   Collection Time: 11/29/16  1:38 PM  Result Value Ref Range Status   Specimen Description URINE, CLEAN CATCH  Final   Special Requests NONE  Final   Culture   Final    NO GROWTH Performed at [facility], [address] [place], [place] [address]    Report Status 11/30/2016 FINAL  Final         Radiology Studies: No results found.      Scheduled Meds: . azithromycin  500 mg Oral Once per day on Mon Wed Fri  . aztreonam  1 g Intravenous Q8H  . divalproex  250 mg Oral BID  . dorzolamide  1 drop Both Eyes BID  . ethambutol  1,600 mg Oral Once per day on Mon Wed Fri  . latanoprost  1 drop Both Eyes QHS  . metoprolol tartrate  25 mg Oral BID  . OLANZapine zydis  5 mg Oral BID  . PARoxetine  20 mg Oral Daily  . rifampin  600 mg Oral Once per day on Mon Wed Fri  . warfarin  1 mg Oral ONCE-1800  . Warfarin - Pharmacist Dosing Inpatient   Does not apply q1800   Continuous Infusions: . dextrose 5 % and 0.9% NaCl 50 mL/hr at 12/01/16 0125     LOS: 4 days    Time spent: 35 minutes.     Alba Coryegalado, Adarrius Graeff A, MD [facility] Pager [phone]  If 7PM-7AM, please contact night-coverage [email or web address] Password [email or web address] 12/01/2016, 4:40 PM

## 2016-12-01 NOTE — Progress Notes (Signed)
ANTICOAGULATION CONSULT NOTE   Pharmacy Consult for warfarin Indication: DVT  Allergies  Allergen Reactions  . Amoxicillin Anaphylaxis and Rash  . Darvocet [Propoxyphene N-Acetaminophen] Other (See Comments)    hallucinations  . Penicillins Anaphylaxis and Rash    Has patient had a PCN reaction causing immediate rash, facial/tongue/throat swelling, SOB or lightheadedness with hypotension: Yes Has patient had a PCN reaction causing severe rash involving mucus membranes or skin necrosis: No Has patient had a PCN reaction that required hospitalization No Has patient had a PCN reaction occurring within the last 10 years: No If all of the above answers are "NO", then may proceed with Cephalosporin use.   . Sulfa Antibiotics Anaphylaxis and Rash  . Propoxyphene Other (See Comments)    hallucinations   Patient Measurements: Weight: 158 lb 15.2 oz (72.1 kg)  Vital Signs: Temp: 98 F (36.7 C) (03/19 0235) Temp Source: Axillary (03/19 0235) BP: 145/59 (03/19 0235) Pulse Rate: 68 (03/19 0235)  Labs:  Recent Labs  11/29/16 1610 11/30/16 0524 11/30/16 0758 12/01/16 0531  HGB  --   --  10.0*  --   HCT  --   --  30.7*  --   PLT  --   --  229  --   LABPROT 37.5* 26.8*  --  30.1*  INR 3.69 2.42  --  2.80  CREATININE  --   --  0.65  --    Estimated Creatinine Clearance: 50.9 mL/min (by C-G formula based on SCr of 0.65 mg/dL).  Medical History: Past Medical History:  Diagnosis Date  . Alzheimer's dementia   . Anxiety   . Arthritis   . Bronchiectasis (HCC) 2014  . COPD (chronic obstructive pulmonary disease) (HCC)   . DVT (deep venous thrombosis) (HCC) 2008   LLE  . Gout   . Headache(784.0)   . Hypertension   . IBS (irritable bowel syndrome)   . MAI (mycobacterium avium-intracellulare) infection (HCC) 2014  . Mold exposure 06/20/2015  . Pneumonia 04/2013   Medications:  Prescriptions Prior to Admission  Medication Sig Dispense Refill Last Dose  . acetaminophen (TYLENOL)  500 MG tablet Take 500 mg by mouth every 6 (six) hours as needed (pain).   11/19/2016  . ALPRAZolam (XANAX) 0.5 MG tablet Take 0.5 mg by mouth every 6 (six) hours as needed for anxiety.   11/24/2016  . azithromycin (ZITHROMAX) 500 MG tablet Take 500 mg by mouth 3 (three) times a week. Monday, Wednesday, & Friday   11/26/2016 at Unknown time  . divalproex (DEPAKOTE SPRINKLE) 125 MG capsule Take 250 mg by mouth 2 (two) times daily.   11/26/2016 at Unknown time  . dorzolamide (TRUSOPT) 2 % ophthalmic solution Place 1 drop into both eyes 2 (two) times daily.   11/27/2016 at Unknown time  . ethambutol (MYAMBUTOL) 400 MG tablet Take 1,600 mg by mouth 3 (three) times a week. Monday, Wednesday, & Friday   11/26/2016 at Unknown time  . haloperidol (HALDOL) 0.5 MG tablet Take 0.5 mg by mouth 2 (two) times daily.   11/27/2016 at Unknown time  . ipratropium-albuterol (DUONEB) 0.5-2.5 (3) MG/3ML SOLN Take 3 mLs by nebulization every 6 (six) hours as needed (SOB).   11/19/2016  . latanoprost (XALATAN) 0.005 % ophthalmic solution Place 1 drop into both eyes at bedtime.   11/26/2016 at Unknown time  . metoprolol tartrate (LOPRESSOR) 25 MG tablet Take 25 mg by mouth 2 (two) times daily.    11/27/2016 at 0900  . PARoxetine (PAXIL) 20  MG tablet Take 20 mg by mouth daily.   11/26/2016 at Unknown time  . rifampin (RIFADIN) 300 MG capsule Take 600 mg by mouth 3 (three) times a week. Monday, Wednesday, & Friday   11/26/2016 at Unknown time  . spironolactone (ALDACTONE) 25 MG tablet Take 25 mg by mouth daily.   11/27/2016 at Unknown time  . warfarin (COUMADIN) 2.5 MG tablet Take 2.5 mg by mouth one time only at 6 PM.    11/26/2016 at 1800  . levofloxacin (LEVAQUIN) 750 MG tablet Take 1 tablet (750 mg total) by mouth every other day. (Patient not taking: Reported on 11/27/2016) 3 tablet 0 Completed Course at Unknown time  . predniSONE (DELTASONE) 50 MG tablet Take one tablet by mouth once a day (Patient not taking: Reported on 11/27/2016) 3  tablet 0 Not Taking at Unknown time   Scheduled:  . azithromycin  500 mg Oral Once per day on Mon Wed Fri  . aztreonam  1 g Intravenous Q8H  . divalproex  250 mg Oral BID  . dorzolamide  1 drop Both Eyes BID  . ethambutol  1,600 mg Oral Once per day on Mon Wed Fri  . latanoprost  1 drop Both Eyes QHS  . metoprolol tartrate  25 mg Oral BID  . OLANZapine zydis  5 mg Oral BID  . PARoxetine  20 mg Oral Daily  . rifampin  600 mg Oral Once per day on Mon Wed Fri  . Warfarin - Pharmacist Dosing Inpatient   Does not apply q1800   Assessment: Patient to ED 3/15 with acute encephalopathy, 10 day hx of agitation, restlessness at SNF)  History of DVT on chronic warfarin.  INR on admit > 4. Home dose 2.5mg /day, LD 3/14 (SNF) Warfarin per pharmacy protocol.    Today, 12/01/2016 INR 2.8- therapeutic, but increased after Coumadin 2mg  given last night CBC decreased, Plt wnl (3/18) No sign of bleeding noted Cardiac diet ordered- no intake charted yesterday Azithromycin and rifampin can increase the effects of warfarin  Goal of Therapy:  INR 2-3   Plan:  Warfarin 1mg  po x 1 at 1800 Daily INR  Junita PushMichelle Stephani Janak, PharmD, BCPS Pager: [phone] 12/01/2016, 8:38 AM

## 2016-12-02 LAB — GLUCOSE, CAPILLARY: Glucose-Capillary: 100 mg/dL — ABNORMAL HIGH (ref 65–99)

## 2016-12-02 LAB — PROTIME-INR
INR: 3.5
Prothrombin Time: 36 seconds — ABNORMAL HIGH (ref 11.4–15.2)

## 2016-12-02 MED ORDER — DIVALPROEX SODIUM 125 MG PO CSDR
250.0000 mg | DELAYED_RELEASE_CAPSULE | Freq: Three times a day (TID) | ORAL | Status: DC
  Administered 2016-12-02 – 2016-12-03 (×2): 250 mg via ORAL
  Filled 2016-12-02 (×2): qty 2

## 2016-12-02 MED ORDER — HYDRALAZINE HCL 20 MG/ML IJ SOLN: 5.0000 mg | Freq: Four times a day (QID) | INTRAMUSCULAR | Status: DC | PRN

## 2016-12-02 MED ORDER — OLANZAPINE 5 MG PO TABS
5.0000 mg | ORAL_TABLET | Freq: Every day | ORAL | Status: DC | PRN
  Filled 2016-12-02 (×2): qty 1

## 2016-12-02 MED ORDER — LORAZEPAM 2 MG/ML IJ SOLN
0.5000 mg | Freq: Four times a day (QID) | INTRAMUSCULAR | Status: DC | PRN
  Administered 2016-12-02 – 2016-12-03 (×3): 0.5 mg via INTRAVENOUS
  Filled 2016-12-02 (×3): qty 1

## 2016-12-02 NOTE — Evaluation (Signed)
Physical Therapy Evaluation Patient Details Name: Stacie CourtDorothy H Mackins MRN: [id number] DOB: Aug 26, 1928 Today's Date: 12/02/2016   History of Present Illness  83 yo female admitted with UTI, acute encephalopathy. hx of Alz, HTN, COPD, DVT, gout.   Clinical Impression  On eval, pt required Min assist for mobility. She walked ~5' x 2 in room with a walker (to [facility] then to sink to wash her hands). Pt c/o "feeling faint" so assisted her back to bed. No family present during session. Recommend HHPT at ALF as long as facility can provide current level of care. If they are unable, pt will need SNF.     Follow Up Recommendations Home health PT;Supervision/Assistance - 24 hour (at ALF IF facility is able to provide current level of care. If not, then pt will need SNF)    Equipment Recommendations   (continuing to assess)    Recommendations for Other Services OT consult     Precautions / Restrictions Precautions Precautions: Fall Restrictions Weight Bearing Restrictions: No      Mobility  Bed Mobility Overal bed mobility: Needs Assistance Bed Mobility: Supine to Sit;Sit to Supine     Supine to sit: Min assist Sit to supine: Min assist   General bed mobility comments: small amount of assist given. Cues for safety.   Transfers Overall transfer level: Needs assistance Equipment used: Rolling walker (2 wheeled) Transfers: Sit to/from Stand Sit to Stand: Min assist         General transfer comment: assist to rise, stabilize, control descent. Pt initally stated she didn't use a walker but once we stood pt stated she needed something to hold on to. Provided rW.   Ambulation/Gait Ambulation/Gait assistance: Min assist Ambulation Distance (Feet): 5 Feet (x2) Assistive device: Rolling walker (2 wheeled) Gait Pattern/deviations: Step-through pattern;Decreased stride length     General Gait Details: Assist to stabilize pt and maneuver safely with walker. Pt appears to fatigue easily. She  c/o "feeling faint."   Information systems managertairs            Wheelchair Mobility    Modified Rankin (Stroke Patients Only)       Balance             Standing balance-Leahy Scale: Poor                               Pertinent Vitals/Pain Pain Assessment: Faces Faces Pain Scale: Hurts little more Pain Location: back Pain Descriptors / Indicators: Aching;Sore Pain Intervention(s): Monitored during session    Home Living Family/patient expects to be discharged to:: Unsure                 Additional Comments: pt unable to provide info    Prior Function           Comments: pt unable to provide PLOF     Hand Dominance        Extremity/Trunk Assessment   Upper Extremity Assessment Upper Extremity Assessment: Defer to OT evaluation    Lower Extremity Assessment Lower Extremity Assessment: Generalized weakness    Cervical / Trunk Assessment Cervical / Trunk Assessment: Kyphotic  Communication   Communication: HOH  Cognition Arousal/Alertness: Awake/alert Behavior During Therapy: Restless;Anxious Overall Cognitive Status: No family/caregiver present to determine baseline cognitive functioning                 General Comments: Pt with h/o dementia     General Comments  Exercises     Assessment/Plan    PT Assessment Patient needs continued PT services  PT Problem List Decreased mobility;Decreased balance;Decreased activity tolerance;Decreased cognition;Pain;Decreased knowledge of use of DME       PT Treatment Interventions DME instruction;Gait training;Therapeutic activities;Therapeutic exercise;Patient/family education;Balance training;Functional mobility training    PT Goals (Current goals can be found in the Care Plan section)  Acute Rehab PT Goals Patient Stated Goal: unable to state PT Goal Formulation: Patient unable to participate in goal setting Time For Goal Achievement: 12/16/16 Potential to Achieve Goals: Fair     Frequency Min 3X/week   Barriers to discharge        Co-evaluation   Reason for Co-Treatment: For patient/therapist safety PT goals addressed during session: Mobility/safety with mobility OT goals addressed during session: ADL's and self-care       End of Session Equipment Utilized During Treatment: Gait belt Activity Tolerance: Patient limited by fatigue Patient left: in bed;with call bell/phone within reach;with bed alarm set   PT Visit Diagnosis: Muscle weakness (generalized) (M62.81);Difficulty in walking, not elsewhere classified (R26.2)         Time: 1610-9604 PT Time Calculation (min) (ACUTE ONLY): 11 min   Charges:   PT Evaluation $PT Eval Low Complexity: 1 Procedure     PT G Codes:         Rebeca Alert, MPT Pager: [phone]

## 2016-12-02 NOTE — Evaluation (Signed)
Occupational Therapy Evaluation Patient Details Name: Stacie Wright MRN: [id number] DOB: [birth year] Today's Date: 12/02/2016    History of Present Illness Pt admitted for acute encephalopathy and has UTI.  H/O alzheimers   Clinical Impression   This 83 year old female was admitted for the above. Unsure of PLOF.  Per chart, she was getting ready to leave SNF to return to memory care ALf.  Pt is very unsteady and needs extensive assistance for ADLs and min A for transfers (+2 safety/lines). She will not be able to call for assistance and needs close monitoring. Recommend SNF.    Follow Up Recommendations  SNF    Equipment Recommendations  3 in 1 bedside commode    Recommendations for Other Services       Precautions / Restrictions Precautions Precautions: Fall Restrictions Weight Bearing Restrictions: No      Mobility Bed Mobility         Supine to sit: Min assist Sit to supine: Min assist   General bed mobility comments: assist for trunk to get up and legs to lie back down  Transfers   Equipment used: Rolling walker (2 wheeled)   Sit to Stand: Min assist         General transfer comment: assist to rise and stabilize.    Balance                                            ADL   Eating/Feeding: Set up;Sitting   Grooming: Wash/dry hands;Minimal assistance;Standing   Upper Body Bathing: Maximal assistance;Sitting   Lower Body Bathing: Maximal assistance;Sit to/from stand   Upper Body Dressing : Maximal assistance;Sitting   Lower Body Dressing: Maximal assistance;Sit to/from stand   Toilet Transfer: Minimal assistance;Ambulation;BSC;RW;+2 for safety/equipment   Toileting- Clothing Manipulation and Hygiene: Total assistance;Sit to/from stand         General ADL Comments: Used commode and stood at sink to wash hands.  Used walker initially, but pt did not keep both hands on it.  She c/o dizziness.  BP in bed was 140/63      Vision         Perception     Praxis      Pertinent Vitals/Pain Pain Assessment: Faces Faces Pain Scale: No hurt     Hand Dominance     Extremity/Trunk Assessment Upper Extremity Assessment Upper Extremity Assessment: Generalized weakness           Communication Communication Communication: HOH   Cognition Arousal/Alertness: Awake/alert Behavior During Therapy: Restless;Anxious Overall Cognitive Status: No family/caregiver present to determine baseline cognitive functioning                 General Comments: Pt with h/o dementia    General Comments       Exercises       Shoulder Instructions      Home Living Family/patient expects to be discharged to:: Skilled nursing facility                                 Additional Comments: was at SNF getting ready to return to memory care ALf per chart      Prior Functioning/Environment          Comments: pt unable to provide info  OT Problem List: Decreased strength;Decreased activity tolerance;Impaired balance (sitting and/or standing);Decreased cognition;Decreased safety awareness;Decreased knowledge of use of DME or AE;Pain      OT Treatment/Interventions: Self-care/ADL training;Therapeutic exercise;DME and/or AE instruction;Therapeutic activities;Cognitive remediation/compensation;Patient/family education;Balance training    OT Goals(Current goals can be found in the care plan section) Acute Rehab OT Goals Patient Stated Goal: unable to state OT Goal Formulation: Patient unable to participate in goal setting Time For Goal Achievement: 12/09/16 Potential to Achieve Goals: Fair ADL Goals Pt Will Perform Grooming: with min guard assist;standing Pt Will Perform Upper Body Bathing: with min assist;sitting Pt Will Perform Lower Body Bathing: with mod assist;sit to/from stand Pt Will Perform Upper Body Dressing: with set-up;sitting Pt Will Perform Lower Body Dressing: with  mod assist;sit to/from stand Pt Will Transfer to Toilet: with min assist;ambulating;bedside commode Pt Will Perform Toileting - Clothing Manipulation and hygiene: with min assist;sit to/from stand  OT Frequency: Min 2X/week   Barriers to D/C:            Co-evaluation PT/OT/SLP Co-Evaluation/Treatment: Yes Reason for Co-Treatment: For patient/therapist safety PT goals addressed during session: Mobility/safety with mobility OT goals addressed during session: ADL's and self-care      End of Session    Activity Tolerance: Patient limited by fatigue Patient left: in bed;with call bell/phone within reach;with bed alarm set  OT Visit Diagnosis: Unsteadiness on feet (R26.81)                ADL either performed or assessed with clinical judgement  Time: [id number] OT Time Calculation (min): 11 min Charges:  OT General Charges $OT Visit: 1 Procedure OT Evaluation $OT Eval Moderate Complexity: 1 Procedure G-Codes:     Stacie Wright, OTR/L [phone] 12/02/2016  Stacie Wright 12/02/2016, 3:26 PM

## 2016-12-02 NOTE — Evaluation (Signed)
SLP Cancellation Note  Patient Details Name: Stacie CourtDorothy H Wright MRN: [id number] DOB: [birth year]   Cancelled treatment:       Reason Eval/Treat Not Completed: Fatigue/lethargy limiting ability to participate (asked RN to page if pt awakens adequately for po/evaluation) Pt has h/o esophageal dysmotility per imaging studies in 11/2014  Mills KollerKimball, Florene Brill Ann Dalessandro Baldyga, MS [facility] SLP [phone]

## 2016-12-02 NOTE — [place] FL2 (Signed)
[place] MEDICAID FL2 LEVEL OF CARE SCREENING TOOL     IDENTIFICATION  Patient Name: Stacie Wright Birthdate: 23-Apr-1928 Sex: female Admission Date (Current Location): 11/27/2016  [facility] and [place] Number:  Producer, television/film/video and Address:  [facility],  [address] [place], [place] [address]      Provider Number: [id number]  Attending Physician Name and Address:  Alba Cory, MD  Relative Name and Phone Number:  Harvie Heck, son, [phone]    Current Level of Care: Hospital Recommended Level of Care: Skilled Nursing Facility Prior Approval Number:    Date Approved/Denied:   PASRR Number: [id number]   Discharge Plan: SNF    Current Diagnoses: Patient Active Problem List   Diagnosis Date Noted  . Acute encephalopathy 11/28/2016  . Pressure injury of skin 11/28/2016  . Coumadin toxicity, accidental or unintentional, initial encounter   . Acute lower UTI 11/27/2016  . Agitation 11/27/2016  . Hypokalemia 11/27/2016  . Macrocytic anemia 11/27/2016  . Supratherapeutic INR 11/27/2016  . Hx of gout 11/20/2016  . Recurrent deep vein thrombosis (DVT) (HCC) 11/18/2016  . HCAP (healthcare-associated pneumonia)   . Respiratory failure (HCC) 11/10/2016  . Pneumonia of right lung due to infectious organism   . Lactic acidosis   . Sepsis (HCC)   . Obstructive bronchiectasis (HCC) 11/01/2015  . Bronchiectasis (HCC) 10/11/2015  . Acute respiratory failure with hypoxia (HCC) 10/11/2015  . Acute exacerbation of bronchiectasis (HCC) 10/11/2015  . Mold exposure 06/20/2015  . Generalized anxiety disorder 01/19/2015  . Depression 01/19/2015  . Dementia in Alzheimer's disease 01/19/2015  . Mycobacterium avium-intracellulare infection (HCC) 01/18/2015  . Influenza with pneumonia 01/15/2015  . Cellulitis 06/09/2013  . Infected hardware in left leg (HCC) 06/09/2013  . Constipation 04/27/2013  . Pneumonia, organism unspecified(486) 04/26/2013  . COPD exacerbation  (HCC) 04/26/2013  . Bronchiectasis without acute exacerbation (HCC) 04/26/2013  . Pulmonary nodule 04/26/2013  . Acute bronchitis 04/25/2013  . Generalized weakness 04/25/2013  . Chronic respiratory failure with hypoxia (HCC) 04/25/2013  . Hypertension     Orientation RESPIRATION BLADDER Height & Weight     Self  O2 Continent Weight: 152 lb 5.4 oz (69.1 kg) Height:     BEHAVIORAL SYMPTOMS/MOOD NEUROLOGICAL BOWEL NUTRITION STATUS      Continent Diet (Heart Healthy )  AMBULATORY STATUS COMMUNICATION OF NEEDS Skin   Limited Assist Verbally Normal                       Personal Care Assistance Level of Assistance    Bathing Assistance: Limited assistance Feeding assistance: Independent Dressing Assistance: Limited assistance     Functional Limitations Info  Sight, Hearing, Speech Sight Info: Adequate Hearing Info: Adequate Speech Info: Adequate    SPECIAL CARE FACTORS FREQUENCY        PT Frequency: 5              Contractures Contractures Info: Not present    Additional Factors Info  Code Status, Allergies, Psychotropic, Isolation Precautions Code Status Info: DNR Allergies Info: Amoxicillin, Darvocet Propoxyphene N-acetaminophen, Penicillins, Sulfa Antibiotics, Propoxyphene Psychotropic Info: RISPERDAL, PAXIL   Isolation Precautions Info: Droplet Precautions      Current Medications (12/02/2016):  This is the current hospital active medication list Current Facility-Administered Medications  Medication Dose Route Frequency Provider Last Rate Last Dose  . acetaminophen (TYLENOL) tablet 500 mg  500 mg Oral Q6H PRN Doug Sou, MD   500 mg at 12/02/16 1604  . azithromycin (  ZITHROMAX) tablet 500 mg  500 mg Oral Once per day on Mon Wed Fri Doug SouSam Jacubowitz, MD   500 mg at 12/01/16 1055  . aztreonam (AZACTAM) 1 GM IVPB  1 g Intravenous Q8H Belkys A Regalado, MD   1 g at 12/02/16 1240  . dextrose 5 %-0.9 % sodium chloride infusion   Intravenous Continuous Belkys  A Regalado, MD 50 mL/hr at 12/01/16 2110    . divalproex (DEPAKOTE SPRINKLE) capsule 250 mg  250 mg Oral Q8H Leata MouseJanardhana Jonnalagadda, MD      . dorzolamide (TRUSOPT) 2 % ophthalmic solution 1 drop  1 drop Both Eyes BID Doug SouSam Jacubowitz, MD   1 drop at 12/02/16 1339  . ethambutol (MYAMBUTOL) tablet 1,600 mg  1,600 mg Oral Once per day on Mon Wed Fri Sam Jacubowitz, MD   1,600 mg at 12/01/16 1037  . haloperidol lactate (HALDOL) injection 1 mg  1 mg Intravenous Q6H PRN Belkys A Regalado, MD   1 mg at 12/02/16 1454  . ipratropium-albuterol (DUONEB) 0.5-2.5 (3) MG/3ML nebulizer solution 3 mL  3 mL Nebulization Q6H PRN Timothy S Opyd, MD      . latanoprost (XALATAN) 0.005 % ophthalmic solution 1 drop  1 drop Both Eyes QHS Doug SouSam Jacubowitz, MD   1 drop at 12/01/16 2200  . LORazepam (ATIVAN) tablet 0.5 mg  0.5 mg Oral Q6H PRN Belkys A Regalado, MD   0.5 mg at 12/01/16 2231  . metoprolol tartrate (LOPRESSOR) tablet 25 mg  25 mg Oral BID Doug SouSam Jacubowitz, MD   25 mg at 12/02/16 1340  . OLANZapine zydis (ZYPREXA) disintegrating tablet 5 mg  5 mg Oral BID Doug SouSam Jacubowitz, MD   5 mg at 12/02/16 1341  . ondansetron (ZOFRAN) tablet 4 mg  4 mg Oral Q6H PRN Briscoe Deutscherimothy S Opyd, MD       Or  . ondansetron (ZOFRAN) injection 4 mg  4 mg Intravenous Q6H PRN Briscoe Deutscherimothy S Opyd, MD      . PARoxetine (PAXIL) tablet 20 mg  20 mg Oral Daily Doug SouSam Jacubowitz, MD   20 mg at 12/02/16 1341  . rifampin (RIFADIN) capsule 600 mg  600 mg Oral Once per day on Mon Wed Fri Doug SouSam Jacubowitz, MD   600 mg at 12/01/16 1035  . Warfarin - Pharmacist Dosing Inpatient   Does not apply [id number] Alba CoryBelkys A Regalado, MD   Stopped at 12/02/16 1800     Discharge Medications: Please see discharge summary for a list of discharge medications.  Relevant Imaging Results:  Relevant Lab Results:   Additional Information SSN: [id number]  Clearance CootsNicole A Adine Heimann, LCSW

## 2016-12-02 NOTE — Progress Notes (Signed)
LCSWA spoke with patient son Harvie HeckRandy about patient disposition to assisted living facility. Patient son states he would like for patient to go [facility], he has visited and spoke with admission about possible placement. LCSWA contacted Meryle [facility]Grace Ann at [facility] about family request and faxed clinical information. She reports the patient has a bed at the facility on memory care unit. At this time PT is still pending. LCSWA will continue to assist with patient disposition.   Vivi BarrackNicole Jermar Colter, Theresia MajorsLCSWA, MSW Clinical Social Worker 5E and Psychiatric Service Line [phone] 12/02/2016  2:13 PM

## 2016-12-02 NOTE — Care Management Note (Signed)
Case Management Note  Patient Details  Name: Stacie Wright MRN: [id number] Date of Birth: April 01, 1928  Subjective/Objective:     Increased confusion and combativeness.                 Action/Plan:Date:  December 02, 2016 Chart reviewed for concurrent status and case management needs. Will continue to follow patient progress. Discharge Planning: following for needs Expected discharge date: [id number] Marcelle SmilingRhonda Wright, BSN, [place], [place]   [phone]   Expected Discharge Date:   (unknown)               Expected Discharge Plan:  Assisted Living / Rest Home  In-House Referral:  Clinical Social Work  Discharge planning Services     Post Acute Care Choice:    Choice offered to:     DME Arranged:    DME Agency:     HH Arranged:    HH Agency:     Status of Service:  In process, will continue to follow  If discussed at Long Length of Stay Meetings, dates discussed:    Additional Comments:  Stacie AcreDavis, Stacie Lynn, RN 12/02/2016, 10:44 AM

## 2016-12-02 NOTE — Progress Notes (Signed)
PT Cancellation Note  Patient Details Name: Stacie Wright MRN: [id number] DOB: 1927/10/07   Cancelled Treatment:    Reason Eval/Treat Not Completed: Spoke with RN who requested PT/OT evals be held at this time. Will check back as schedule allows.    [name], MPT Pager: [phone]

## 2016-12-02 NOTE — Progress Notes (Signed)
ANTICOAGULATION CONSULT NOTE   Pharmacy Consult for warfarin Indication: DVT  Allergies  Allergen Reactions  . Amoxicillin Anaphylaxis and Rash  . Darvocet [Propoxyphene N-Acetaminophen] Other (See Comments)    hallucinations  . Penicillins Anaphylaxis and Rash    Has patient had a PCN reaction causing immediate rash, facial/tongue/throat swelling, SOB or lightheadedness with hypotension: Yes Has patient had a PCN reaction causing severe rash involving mucus membranes or skin necrosis: No Has patient had a PCN reaction that required hospitalization No Has patient had a PCN reaction occurring within the last 10 years: No If all of the above answers are "NO", then may proceed with Cephalosporin use.   . Sulfa Antibiotics Anaphylaxis and Rash  . Propoxyphene Other (See Comments)    hallucinations   Patient Measurements: Weight: 152 lb 5.4 oz (69.1 kg)  Vital Signs:    Labs:  Recent Labs  11/30/16 0524 11/30/16 0758 12/01/16 0531 12/02/16 0600  HGB  --  10.0*  --   --   HCT  --  30.7*  --   --   PLT  --  229  --   --   LABPROT 26.8*  --  30.1* 36.0*  INR 2.42  --  2.80 3.50  CREATININE  --  0.65  --   --    Estimated Creatinine Clearance: 50.9 mL/min (by C-G formula based on SCr of 0.65 mg/dL).  Medical History: Past Medical History:  Diagnosis Date  . Alzheimer's dementia   . Anxiety   . Arthritis   . Bronchiectasis (HCC) 2014  . COPD (chronic obstructive pulmonary disease) (HCC)   . DVT (deep venous thrombosis) (HCC) 2008   LLE  . Gout   . Headache(784.0)   . Hypertension   . IBS (irritable bowel syndrome)   . MAI (mycobacterium avium-intracellulare) infection (HCC) 2014  . Mold exposure 06/20/2015  . Pneumonia 04/2013   Medications:  Prescriptions Prior to Admission  Medication Sig Dispense Refill Last Dose  . acetaminophen (TYLENOL) 500 MG tablet Take 500 mg by mouth every 6 (six) hours as needed (pain).   11/19/2016  . ALPRAZolam (XANAX) 0.5 MG tablet  Take 0.5 mg by mouth every 6 (six) hours as needed for anxiety.   11/24/2016  . azithromycin (ZITHROMAX) 500 MG tablet Take 500 mg by mouth 3 (three) times a week. Monday, Wednesday, & Friday   11/26/2016 at Unknown time  . divalproex (DEPAKOTE SPRINKLE) 125 MG capsule Take 250 mg by mouth 2 (two) times daily.   11/26/2016 at Unknown time  . dorzolamide (TRUSOPT) 2 % ophthalmic solution Place 1 drop into both eyes 2 (two) times daily.   11/27/2016 at Unknown time  . ethambutol (MYAMBUTOL) 400 MG tablet Take 1,600 mg by mouth 3 (three) times a week. Monday, Wednesday, & Friday   11/26/2016 at Unknown time  . haloperidol (HALDOL) 0.5 MG tablet Take 0.5 mg by mouth 2 (two) times daily.   11/27/2016 at Unknown time  . ipratropium-albuterol (DUONEB) 0.5-2.5 (3) MG/3ML SOLN Take 3 mLs by nebulization every 6 (six) hours as needed (SOB).   11/19/2016  . latanoprost (XALATAN) 0.005 % ophthalmic solution Place 1 drop into both eyes at bedtime.   11/26/2016 at Unknown time  . metoprolol tartrate (LOPRESSOR) 25 MG tablet Take 25 mg by mouth 2 (two) times daily.    11/27/2016 at 0900  . PARoxetine (PAXIL) 20 MG tablet Take 20 mg by mouth daily.   11/26/2016 at Unknown time  . rifampin (RIFADIN) 300  MG capsule Take 600 mg by mouth 3 (three) times a week. Monday, Wednesday, & Friday   11/26/2016 at Unknown time  . spironolactone (ALDACTONE) 25 MG tablet Take 25 mg by mouth daily.   11/27/2016 at Unknown time  . warfarin (COUMADIN) 2.5 MG tablet Take 2.5 mg by mouth one time only at 6 PM.    11/26/2016 at 1800  . levofloxacin (LEVAQUIN) 750 MG tablet Take 1 tablet (750 mg total) by mouth every other day. (Patient not taking: Reported on 11/27/2016) 3 tablet 0 Completed Course at Unknown time  . predniSONE (DELTASONE) 50 MG tablet Take one tablet by mouth once a day (Patient not taking: Reported on 11/27/2016) 3 tablet 0 Not Taking at Unknown time   Scheduled:  . azithromycin  500 mg Oral Once per day on Mon Wed Fri  . aztreonam   1 g Intravenous Q8H  . divalproex  250 mg Oral BID  . dorzolamide  1 drop Both Eyes BID  . ethambutol  1,600 mg Oral Once per day on Mon Wed Fri  . latanoprost  1 drop Both Eyes QHS  . metoprolol tartrate  25 mg Oral BID  . OLANZapine zydis  5 mg Oral BID  . PARoxetine  20 mg Oral Daily  . rifampin  600 mg Oral Once per day on Mon Wed Fri  . Warfarin - Pharmacist Dosing Inpatient   Does not apply q1800   Assessment: Patient to ED 3/15 with acute encephalopathy, 10 day hx of agitation, restlessness at SNF)  History of DVT on chronic warfarin.  INR on admit > 4. Home dose 2.5mg /day, LD 3/14 (SNF) Warfarin per pharmacy protocol.    Today, 12/02/2016  INR 3.5- supra-therapeutic today after 2 (lower than her usual) doses of Coumadin  CBC low but stable, Plt wnl but trending down (3/18)  No sign of bleeding noted  Cardiac diet ordered- 0-20% of meal intake charted yesterday  Azithromycin and rifampin (on PTA) and broad-spectrum antibiotics can increase the effects of warfarin  Goal of Therapy:  INR 2-3   Plan:  Hold Warfarin tonight Daily INR  Junita Push, PharmD, BCPS Pager: [phone] 12/02/2016, 8:56 AM

## 2016-12-02 NOTE — Progress Notes (Signed)
PROGRESS NOTE    Stacie Wright  ZOX:[id number] DOB: 1928/02/04 DOA: 11/27/2016 PCP: Dwana Melena, MD   Brief Narrative: Stacie Wright is a 83 y.o. female with medical history significant for Alzheimer's dementia, bronchiectasis on chronic antibiotic therapy, hypertension, and depression with anxiety who presents from her nursing facility for evaluation of agitation and restlessness. Patient has reportedly been agitated for the past 10 days at her nursing facility, sleeping very little if at all, wandering in and out of other resident's rooms, and interfering with staff as they attend to other residents. She is reportedly quite confused at baseline. There is been no recent fevers or chills reported and patient had a negative urinalysis early in the course of this illness. She had been treated with Xanax and Haldol at the nursing facility, but after telephone consultation with psychiatry, this was changed to low-dose Ativan and scheduled Zyprexa and Depakote. Unfortunately, despite treatment with these agents, the patient's condition has persisted and there is concern that she is posing a risk to her own safety as well as that of the other residents and staff at her facility. There has been no known trauma or fall recently and the patient is not known to use alcohol or illicit substances. She has not been able to express any specific complaints.  Patient admitted with acute encephalopathy, agitation, behavior problems. She has been getting treatment for UTI. She has underline dementia with behavior problems. Her behavior could be worse in setting of infection or progression of dementia.    Assessment & Plan:   Principal Problem:   Acute encephalopathy Active Problems:   Hypertension   Chronic respiratory failure with hypoxia (HCC)   Depression   Dementia in Alzheimer's disease   Obstructive bronchiectasis (HCC)   Recurrent deep vein thrombosis (DVT) (HCC)   Acute lower UTI   Agitation  Hypokalemia   Macrocytic anemia   Supratherapeutic INR   Coumadin toxicity, accidental or unintentional, initial encounter   Pressure injury of skin   Acute Encephalopathy, Agitation;  -Pt presents with 10 days of agitation and restlessness at her nursing home. -Continue Zyprexa, Depakote, and low-dose prn Ativan  Treat for infectious precess.  CT head negative Ammonia , TSH 1.1, B12 725 continue with  IV fluids to D 5. -Patient usually gets agitated during night. I have ordred, PRN IV haldol, ativan.  - Psych consulted  to help with medications.  -discussed with psych , will order extra dose of zyprexa 5 mg PRN daily for agitation.   UTI;  Continue with Aztreonam.  Culture growing multiple bacterial morphotype, culture reordered.  WBC trending down.  Discharge on keflex.   Bronchiectasis, chronic hypoxic respiratory failure. Continue with oxygen supplementation and IV antibiotics.  Continue with nebulizer.  On chronic antibiotics.   History of DVT;  supra therapeutic INR. INR trending down.  Coumadin per pharmacy to dose.    Macrocytic anemia ; b 12  Normal and folate level.   Hypokalemia; replaced.      DVT prophylaxis: on coumadin.  Code Status:DNR Family Communication: none at bedside.  Disposition Plan: remain inpatient , back to SNF when stable.   Consultants:   none   Procedures; none   Antimicrobials:   Aztreonam 3-16   Subjective: Patient wake up answer some questions.  Patient gets agitated mostly at night.      Objective: Vitals:   12/01/16 2024 12/02/16 0500 12/02/16 1359 12/02/16 1623  BP: (!) 149/78  (!) 156/63 (!) 175/70  Pulse: 76  66   Resp: 18  18   Temp: 97.7 F (36.5 C)  98.2 F (36.8 C)   TempSrc: Oral  Oral   SpO2: 92%  93%   Weight:  69.1 kg (152 lb 5.4 oz)      Intake/Output Summary (Last 24 hours) at 12/02/16 1813 Last data filed at 12/02/16 1739  Gross per 24 hour  Intake              180 ml  Output              1650 ml  Net            -1470 ml   Filed Weights   11/30/16 1543 12/01/16 0601 12/02/16 0500  Weight: 69.8 kg (153 lb 14.1 oz) 72.1 kg (158 lb 15.2 oz) 69.1 kg (152 lb 5.4 oz)    Examination:  General exam: Appears calm and comfortable  Respiratory system: Respiratory effort normal. Bilateral ronchus.  Cardiovascular system: S1 & S2 heard, RRR. No JVD, murmurs, rubs, gallops or clicks. No pedal edema. Gastrointestinal system: Abdomen is nondistended, soft and nontender. No organomegaly or masses felt. Normal bowel sounds heard. Central nervous system: Sleepy  Extremities: Symmetric 5 x 5 power. Skin: No rashes, lesions or ulcers Psychiatry:calm this morning.     Data Reviewed: I have personally reviewed following labs and imaging studies  CBC:  Recent Labs Lab 11/27/16 1810 11/28/16 0553 11/30/16 0758  WBC 14.5* 7.4 6.3  NEUTROABS 11.9* 5.1  --   HGB 11.3* 9.5* 10.0*  HCT 34.9* 29.9* 30.7*  MCV 101.2* 99.0 100.0  PLT 324 250 229   Basic Metabolic Panel:  Recent Labs Lab 11/27/16 1810 11/28/16 0553 11/30/16 0758  NA 139 141 142  K 3.2* 3.5 3.6  CL 98* 103 112*  CO2 30 33* 26  GLUCOSE 110* 92 91  BUN 26* 24* 12  CREATININE 0.99 0.99 0.65  CALCIUM 8.6* 8.2* 7.5*   GFR: Estimated Creatinine Clearance: 50.9 mL/min (by C-G formula based on SCr of 0.65 mg/dL). Liver Function Tests:  Recent Labs Lab 11/27/16 1810  AST 28  ALT 21  ALKPHOS 48  BILITOT 1.1  PROT 7.0  ALBUMIN 3.5   No results for input(s): LIPASE, AMYLASE in the last 168 hours. No results for input(s): AMMONIA in the last 168 hours. Coagulation Profile:  Recent Labs Lab 11/28/16 0553 11/29/16 [id number] 11/30/16 0524 12/01/16 0531 12/02/16 0600  INR 4.39* 3.69 2.42 2.80 3.50   Cardiac Enzymes: No results for input(s): CKTOTAL, CKMB, CKMBINDEX, TROPONINI in the last 168 hours. BNP (last 3 results) No results for input(s): PROBNP in the last 8760 hours. HbA1C: No results for  input(s): HGBA1C in the last 72 hours. CBG:  Recent Labs Lab 11/28/16 1131 11/29/16 0739 11/30/16 0803 12/01/16 1032 12/02/16 0730  GLUCAP 88 108* 96 90 100*   Lipid Profile: No results for input(s): CHOL, HDL, LDLCALC, TRIG, CHOLHDL, LDLDIRECT in the last 72 hours. Thyroid Function Tests:  Recent Labs  11/30/16 0758  TSH 1.176   Anemia Panel:  Recent Labs  11/30/16 0758  VITAMINB12 725   Sepsis Labs: No results for input(s): PROCALCITON, LATICACIDVEN in the last 168 hours.  Recent Results (from the past 240 hour(s))  Urine culture     Status: Abnormal   Collection Time: 11/27/16  6:02 PM  Result Value Ref Range Status   Specimen Description URINE, CLEAN CATCH  Final   Special Requests Normal  Final   Culture MULTIPLE SPECIES  PRESENT, SUGGEST RECOLLECTION (A)  Final   Report Status 11/29/2016 FINAL  Final  Urine culture     Status: None   Collection Time: 11/29/16  1:38 PM  Result Value Ref Range Status   Specimen Description URINE, CLEAN CATCH  Final   Special Requests NONE  Final   Culture   Final    NO GROWTH Performed at [facility], [address] [place], [place] [address]    Report Status 11/30/2016 FINAL  Final         Radiology Studies: No results found.      Scheduled Meds: . azithromycin  500 mg Oral Once per day on Mon Wed Fri  . aztreonam  1 g Intravenous Q8H  . divalproex  250 mg Oral Q8H  . dorzolamide  1 drop Both Eyes BID  . ethambutol  1,600 mg Oral Once per day on Mon Wed Fri  . latanoprost  1 drop Both Eyes QHS  . metoprolol tartrate  25 mg Oral BID  . OLANZapine zydis  5 mg Oral BID  . PARoxetine  20 mg Oral Daily  . rifampin  600 mg Oral Once per day on Mon Wed Fri  . Warfarin - Pharmacist Dosing Inpatient   Does not apply q1800   Continuous Infusions: . dextrose 5 % and 0.9% NaCl 50 mL/hr at 12/01/16 2110     LOS: 5 days    Time spent: 25 minutes.     Alba Cory, MD [facility] Pager  [phone]  If 7PM-7AM, please contact night-coverage [email or web address] Password [facility] 12/02/2016, 6:13 PM

## 2016-12-03 DIAGNOSIS — G9349 Other encephalopathy: Secondary | ICD-10-CM | POA: Diagnosis not present

## 2016-12-03 DIAGNOSIS — Z792 Long term (current) use of antibiotics: Secondary | ICD-10-CM | POA: Diagnosis not present

## 2016-12-03 DIAGNOSIS — R451 Restlessness and agitation: Secondary | ICD-10-CM

## 2016-12-03 DIAGNOSIS — T45511A Poisoning by anticoagulants, accidental (unintentional), initial encounter: Secondary | ICD-10-CM | POA: Diagnosis not present

## 2016-12-03 DIAGNOSIS — I10 Essential (primary) hypertension: Secondary | ICD-10-CM | POA: Diagnosis not present

## 2016-12-03 DIAGNOSIS — F329 Major depressive disorder, single episode, unspecified: Secondary | ICD-10-CM | POA: Diagnosis not present

## 2016-12-03 DIAGNOSIS — E43 Unspecified severe protein-calorie malnutrition: Secondary | ICD-10-CM | POA: Diagnosis not present

## 2016-12-03 DIAGNOSIS — I82409 Acute embolism and thrombosis of unspecified deep veins of unspecified lower extremity: Secondary | ICD-10-CM | POA: Diagnosis not present

## 2016-12-03 DIAGNOSIS — J471 Bronchiectasis with (acute) exacerbation: Secondary | ICD-10-CM | POA: Diagnosis not present

## 2016-12-03 DIAGNOSIS — J9611 Chronic respiratory failure with hypoxia: Secondary | ICD-10-CM | POA: Diagnosis not present

## 2016-12-03 DIAGNOSIS — J479 Bronchiectasis, uncomplicated: Secondary | ICD-10-CM | POA: Diagnosis not present

## 2016-12-03 DIAGNOSIS — J189 Pneumonia, unspecified organism: Secondary | ICD-10-CM | POA: Diagnosis not present

## 2016-12-03 DIAGNOSIS — G9340 Encephalopathy, unspecified: Secondary | ICD-10-CM

## 2016-12-03 DIAGNOSIS — R4182 Altered mental status, unspecified: Secondary | ICD-10-CM | POA: Diagnosis not present

## 2016-12-03 DIAGNOSIS — R278 Other lack of coordination: Secondary | ICD-10-CM | POA: Diagnosis not present

## 2016-12-03 DIAGNOSIS — N390 Urinary tract infection, site not specified: Principal | ICD-10-CM

## 2016-12-03 DIAGNOSIS — R1311 Dysphagia, oral phase: Secondary | ICD-10-CM | POA: Diagnosis not present

## 2016-12-03 DIAGNOSIS — J449 Chronic obstructive pulmonary disease, unspecified: Secondary | ICD-10-CM | POA: Diagnosis not present

## 2016-12-03 DIAGNOSIS — M6281 Muscle weakness (generalized): Secondary | ICD-10-CM | POA: Diagnosis not present

## 2016-12-03 LAB — PROTIME-INR
INR: 3.1
PROTHROMBIN TIME: 32.7 s — AB (ref 11.4–15.2)

## 2016-12-03 LAB — GLUCOSE, CAPILLARY: Glucose-Capillary: 103 mg/dL — ABNORMAL HIGH (ref 65–99)

## 2016-12-03 MED ORDER — ALPRAZOLAM 0.5 MG PO TABS: 0.5000 mg | ORAL_TABLET | Freq: Four times a day (QID) | ORAL | 0 refills | Status: DC | PRN

## 2016-12-03 MED ORDER — DIVALPROEX SODIUM 125 MG PO CSDR: 250.0000 mg | DELAYED_RELEASE_CAPSULE | Freq: Three times a day (TID) | ORAL | Status: AC

## 2016-12-03 MED ORDER — DOXYCYCLINE HYCLATE 100 MG PO TABS: 100.0000 mg | ORAL_TABLET | Freq: Two times a day (BID) | ORAL | Status: AC

## 2016-12-03 MED ORDER — DM-GUAIFENESIN ER 30-600 MG PO TB12: 1.0000 | ORAL_TABLET | Freq: Two times a day (BID) | ORAL | Status: DC

## 2016-12-03 MED ORDER — WARFARIN SODIUM 2 MG PO TABS: 2.0000 mg | ORAL_TABLET | Freq: Once | ORAL | Status: DC

## 2016-12-03 MED ORDER — ACETAMINOPHEN 500 MG PO TABS: 500.0000 mg | ORAL_TABLET | Freq: Four times a day (QID) | ORAL | Status: AC | PRN

## 2016-12-03 MED ORDER — OLANZAPINE 5 MG PO TBDP: 5.0000 mg | ORAL_TABLET | Freq: Two times a day (BID) | ORAL | Status: AC

## 2016-12-03 MED ORDER — SPIRONOLACTONE 25 MG PO TABS: 12.5000 mg | ORAL_TABLET | Freq: Every day | ORAL | Status: DC

## 2016-12-03 NOTE — Discharge Summary (Addendum)
Physician Discharge Summary  Stacie Wright ZOX:[id number] DOB: 04/03/1928 DOA: 11/27/2016  PCP: Dwana Melena, MD  Admit date: 11/27/2016 Discharge date: 12/03/2016  Time spent: 35 minutes  Recommendations for Outpatient Follow-up:  1. Repeat CBC in 5 days to follow Hgb and WBC's trend  2. Repeat INR in 3 days to follow level and to adjust dose of coumadin as needed  3. Repeat BMET in 5 days to follow electrolytes and renal function  4. Recommend Palliative Care follow up at facility    Discharge Diagnoses:  Principal Problem:   Acute encephalopathy Active Problems:   Hypertension   Chronic respiratory failure with hypoxia (HCC)   Depression   Dementia in Alzheimer's disease   Obstructive bronchiectasis (HCC)   Recurrent deep vein thrombosis (DVT) (HCC)   Acute lower UTI   Agitation   Hypokalemia   Macrocytic anemia   Supratherapeutic INR   Coumadin toxicity, accidental or unintentional, initial encounter   Pressure injury of skin   Severe protein-calorie malnutrition (HCC)   Discharge Condition: stable and improved. Discharge back to SNF for further care and rehabilitation. Will benefit of Palliative care follow up at SNF. PCP follow up in 2 weeks.  Diet recommendation: heart healthy diet; dysphagia 3 with thin liquids.  Filed Weights   11/30/16 1543 12/01/16 0601 12/02/16 0500  Weight: 69.8 kg (153 lb 14.1 oz) 72.1 kg (158 lb 15.2 oz) 69.1 kg (152 lb 5.4 oz)    History of present illness:  83 y.o.femalewith medical history significant for Alzheimer's dementia, bronchiectasis on chronic antibiotic therapy, hypertension, and depression with anxiety who presents from her nursing facility for evaluation of agitation and restlessness. Patient has reportedly been agitated for the past 10 days at her nursing facility, sleeping very little if at all, wandering in and out of other resident's rooms, and interfering with staff as they attend to other residents. She is reportedly  quite confused at baseline. There is been no recent fevers or chills reported. In ED urine suggestive of presumed UTI. Admitted for further evaluation and treatment.   Hospital Course:  Acute encephalopathy: toxic, due to UTI most likely. Also with concerns for worsening underlying dementia. -treating underlying infection (see below) -psychiatry was consulted and medication adjusted -CT head neg for acute abnormalities -discharge back to SNF for further care and rehab. -will benefit of palliative care follow   UTI -no microorganism isolated -treated with 5 days of aztreonam  -patient needs to maintain adequate hydration to guarantee good urinary tract flushing process  Chronic hypoxic resp failure and bronchiectasis -will continue home prophylaxis antibiotics therapy for MAI. -Also discharge on doxycycline to complete antibiotic therapy for acute bronchiectasis -continue home oxygen supplementation -no CP -good O2 sat  Hx of DVT: with supratherapeutic INR on admission -will continue coumadin -dose adjusted prior to discharge for better control -presumed to be associated with dehydration and use of antibiotics.  Macrocytic anemia: -Hgb stable -normal B12 and foley recommending close outpatient follow up  Hypokalemia: -repleted -will recommend BMET in the next 5 days to follow electrolytes and renal function   HTN -will continue low dose spironolactone -watch sodium intake  Pressure injury: stage 1 -sacral decubitus -continue preventive measures -will follow wound care rec's  Procedures:  See below for x-ray reports   Consultations:  Psychiatry   Discharge Exam: Vitals:   12/02/16 2058 12/03/16 0606  BP: (!) 176/62 (!) 168/74  Pulse: 78 67  Resp: 18 18  Temp: 98.7 F (37.1 C) 98.8 F (37.1  C)    General: afebrile, oriented X 1 only. Poor insight and judgement. No CP, no SOB. Positive intermittent cough.  Cardiovascular: S1 and S2, no rubs or gallops, no  JVD Respiratory: scattered rhonchi, no wheezing, no crackles. abd: soft, NT, ND, positive BS Extremities: no edema, no cyanosis  Skin: with stage 1 sacral decubitus ulcer; no other rashes or induration. Pressure injury was present prior to admission.  Discharge Instructions   Discharge Instructions    Discharge instructions    Complete by:  As directed    Maintain adequate hydration and nutrition Please have palliative care follow patient at SNF Follow adjusted dose on psych medications as per psychiatry service and continue providing supportive care     Current Discharge Medication List    START taking these medications   Details  dextromethorphan-guaiFENesin (MUCINEX DM) 30-600 MG 12hr tablet Take 1 tablet by mouth 2 (two) times daily.    doxycycline (VIBRA-TABS) 100 MG tablet Take 1 tablet (100 mg total) by mouth 2 (two) times daily.    OLANZapine zydis (ZYPREXA) 5 MG disintegrating tablet Take 1 tablet (5 mg total) by mouth 2 (two) times daily.      CONTINUE these medications which have CHANGED   Details  acetaminophen (TYLENOL) 500 MG tablet Take 1 tablet (500 mg total) by mouth every 6 (six) hours as needed for mild pain, fever or headache.    ALPRAZolam (XANAX) 0.5 MG tablet Take 1-2 tablets (0.5-1 mg total) by mouth every 6 (six) hours as needed for anxiety. Qty: 15 tablet, Refills: 0    divalproex (DEPAKOTE SPRINKLE) 125 MG capsule Take 2 capsules (250 mg total) by mouth 3 (three) times daily.    spironolactone (ALDACTONE) 25 MG tablet Take 0.5 tablets (12.5 mg total) by mouth daily.    warfarin (COUMADIN) 2 MG tablet Take 1 tablet (2 mg total) by mouth one time only at 6 PM.      CONTINUE these medications which have NOT CHANGED   Details  azithromycin (ZITHROMAX) 500 MG tablet Take 500 mg by mouth 3 (three) times a week. Monday, Wednesday, & Friday    dorzolamide (TRUSOPT) 2 % ophthalmic solution Place 1 drop into both eyes 2 (two) times daily.    ethambutol  (MYAMBUTOL) 400 MG tablet Take 1,600 mg by mouth 3 (three) times a week. Monday, Wednesday, & Friday    ipratropium-albuterol (DUONEB) 0.5-2.5 (3) MG/3ML SOLN Take 3 mLs by nebulization every 6 (six) hours as needed (SOB).    latanoprost (XALATAN) 0.005 % ophthalmic solution Place 1 drop into both eyes at bedtime.    metoprolol tartrate (LOPRESSOR) 25 MG tablet Take 25 mg by mouth 2 (two) times daily.     PARoxetine (PAXIL) 20 MG tablet Take 20 mg by mouth daily.    rifampin (RIFADIN) 300 MG capsule Take 600 mg by mouth 3 (three) times a week. Monday, Wednesday, & Friday      STOP taking these medications     haloperidol (HALDOL) 0.5 MG tablet      levofloxacin (LEVAQUIN) 750 MG tablet      predniSONE (DELTASONE) 50 MG tablet        Allergies  Allergen Reactions  . Amoxicillin Anaphylaxis and Rash  . Darvocet [Propoxyphene N-Acetaminophen] Other (See Comments)    hallucinations  . Penicillins Anaphylaxis and Rash    Has patient had a PCN reaction causing immediate rash, facial/tongue/throat swelling, SOB or lightheadedness with hypotension: Yes Has patient had a PCN reaction causing severe rash  involving mucus membranes or skin necrosis: No Has patient had a PCN reaction that required hospitalization No Has patient had a PCN reaction occurring within the last 10 years: No If all of the above answers are "NO", then may proceed with Cephalosporin use.   . Sulfa Antibiotics Anaphylaxis and Rash  . Propoxyphene Other (See Comments)    hallucinations   Follow-up Information    Dwana Melena, MD. Schedule an appointment as soon as possible for a visit in 2 week(s).   Specialty:  Internal Medicine Contact information: [address] [place] [address] [phone]          The results of significant diagnostics from this hospitalization (including imaging, microbiology, ancillary and laboratory) are listed below for reference.    Significant Diagnostic Studies: Ct  Head Wo Contrast  Result Date: 11/28/2016 CLINICAL DATA:  Confusion. EXAM: CT HEAD WITHOUT CONTRAST TECHNIQUE: Contiguous axial images were obtained from the base of the skull through the vertex without intravenous contrast. COMPARISON:  11/30/2015 FINDINGS: Brain: There is no evidence of acute cortical infarct, intracranial hemorrhage, mass, midline shift, or extra-axial fluid collection. Cerebral atrophy is unchanged. Deep cerebral white matter hypodensities are stable to slightly more prominent than on the prior study and nonspecific but compatible with chronic small vessel ischemic disease, relatively mild for age. Vascular: Calcified atherosclerosis at the skullbase. No hyperdense vessel. Skull: No fracture or focal osseous lesion. Sinuses/Orbits: Visualized paranasal sinuses and mastoid air cells are clear. Bilateral cataract extraction. Other: None. IMPRESSION: 1. No evidence of acute intracranial abnormality. 2. Mild chronic small vessel ischemic disease. Electronically Signed   By: Sebastian Ache M.D.   On: 11/28/2016 15:05   Dg Chest Port 1 View  Result Date: 11/27/2016 CLINICAL DATA:  Cough history of bronchiectasis EXAM: PORTABLE CHEST 1 VIEW COMPARISON:  11/15/2016, 11/11/2016, CT 01/11/2016 FINDINGS: Diffuse interstitial opacities right greater than left, do not appear significantly changed compared with 11/15/2016, and are improved compared with 11/13/2016 and 11/11/2016. No new consolidation or effusion. Normal heart size. No pneumothorax. IMPRESSION: Diffuse coarse right greater than left interstitial opacities, much of which is suspected to be secondary to chronic fibrosis. Overall radiographic appearance of the chest does not appear significantly changed compared with 11/15/2016 and is improved from recent earlier radiographs Electronically Signed   By: Jasmine Pang M.D.   On: 11/27/2016 18:16   Dg Chest Port 1 View  Result Date: 11/15/2016 CLINICAL DATA:  Cough.  Shortness of breath.  EXAM: PORTABLE CHEST 1 VIEW COMPARISON:  11/13/2016 and 10/06/2016 FINDINGS: Mildly improved aeration in both lungs compared to the recent comparison examination. There continues to be prominent interstitial densities in both lungs, particularly in the right lung. Some of the disease in the left hilar region appears to represent chronic changes. Heart size is within normal limits. The trachea is midline. Negative for pneumothorax. No acute bone abnormality. IMPRESSION: Mildly improved aeration in both lungs. Findings could represent decreasing pulmonary edema or possibly infection. There continues to be prominent interstitial densities in the lungs, right side greater than left. This most likely represents acute on chronic disease. Electronically Signed   By: Richarda Overlie M.D.   On: 11/15/2016 13:46   Dg Chest Port 1 View  Result Date: 11/13/2016 CLINICAL DATA:  Respiratory failure. EXAM: PORTABLE CHEST 1 VIEW COMPARISON:  Radiograph of November 11, 2016. FINDINGS: Stable cardiomediastinal silhouette. Increased bilateral diffuse interstitial densities are noted concerning for pulmonary edema or possibly inflammation. Increased right apical airspace opacity is noted  concerning for pneumonia. No pneumothorax is noted. No significant pleural effusion is noted. Bony thorax is unremarkable. IMPRESSION: Increased bilateral diffuse interstitial densities are noted concerning for worsening pulmonary edema or possibly inflammation. Increased right apical airspace opacity concerning for pneumonia. Electronically Signed   By: Lupita Raider, M.D.   On: 11/13/2016 13:00   Dg Chest Port 1 View  Result Date: 11/11/2016 CLINICAL DATA:  Respiratory failure. EXAM: PORTABLE CHEST 1 VIEW COMPARISON:  11/10/2016. FINDINGS: Cardiomegaly with normal pulmonary vascularity. Right upper lobe and bilateral lower lobe pulmonary infiltrates. Interim significant progression from prior exam. Small right pleural effusion. No pneumothorax.  IMPRESSION: Right upper lobe and bilateral lower lobe infiltrates noted consistent with pneumonia. Findings have progressed significantly from prior exam. Small right pleural effusion Electronically Signed   By: Maisie Fus  Register   On: 11/11/2016 07:35   Dg Chest Portable 1 View  Result Date: 11/10/2016 CLINICAL DATA:  Shortness of breath, weakness EXAM: PORTABLE CHEST 1 VIEW COMPARISON:  10/06/2016 FINDINGS: Patchy airspace disease noted in the right upper lobe and both lung bases. This is similar to prior study and prior CT in the lung bases, likely related to bronchiectasis seen on prior CT. This is increased slightly in the right upper lobe. Heart is normal size. No effusions or acute bony abnormality. IMPRESSION: Bronchiectatic changes in the lung bases. Increasing density in the right upper lobe. Cannot exclude superimposed pneumonia. Electronically Signed   By: Charlett Nose M.D.   On: 11/10/2016 09:11    Microbiology: Recent Results (from the past 240 hour(s))  Urine culture     Status: Abnormal   Collection Time: 11/27/16  6:02 PM  Result Value Ref Range Status   Specimen Description URINE, CLEAN CATCH  Final   Special Requests Normal  Final   Culture MULTIPLE SPECIES PRESENT, SUGGEST RECOLLECTION (A)  Final   Report Status 11/29/2016 FINAL  Final  Urine culture     Status: None   Collection Time: 11/29/16  1:38 PM  Result Value Ref Range Status   Specimen Description URINE, CLEAN CATCH  Final   Special Requests NONE  Final   Culture   Final    NO GROWTH Performed at [facility], [address], [place], [place] [address]    Report Status 11/30/2016 FINAL  Final     Labs: Basic Metabolic Panel:  Recent Labs Lab 11/27/16 1810 11/28/16 0553 11/30/16 0758  NA 139 141 142  K 3.2* 3.5 3.6  CL 98* 103 112*  CO2 30 33* 26  GLUCOSE 110* 92 91  BUN 26* 24* 12  CREATININE 0.99 0.99 0.65  CALCIUM 8.6* 8.2* 7.5*   Liver Function Tests:  Recent Labs Lab 11/27/16 1810   AST 28  ALT 21  ALKPHOS 48  BILITOT 1.1  PROT 7.0  ALBUMIN 3.5   CBC:  Recent Labs Lab 11/27/16 1810 11/28/16 0553 11/30/16 0758  WBC 14.5* 7.4 6.3  NEUTROABS 11.9* 5.1  --   HGB 11.3* 9.5* 10.0*  HCT 34.9* 29.9* 30.7*  MCV 101.2* 99.0 100.0  PLT 324 250 229   BNP (last 3 results)  Recent Labs  11/10/16 0846  BNP 299.1*   CBG:  Recent Labs Lab 11/29/16 0739 11/30/16 0803 12/01/16 1032 12/02/16 0730 12/03/16 0752  GLUCAP 108* 96 90 100* 103*    Signed:  Vassie Loll MD.  [facility] 12/03/2016, 12:17 PM

## 2016-12-03 NOTE — Progress Notes (Signed)
[id number]/Krissi Willaims,BSN,RN3,CCM:[phone]/pATIENT BEING DISCHARGED TO [facility]/NO cm NEEDS PRESENT IN CHART.

## 2016-12-03 NOTE — Progress Notes (Signed)
[facility] Gave report to RN. Awaiting PTAR.

## 2016-12-03 NOTE — Progress Notes (Signed)
ANTICOAGULATION CONSULT NOTE   Pharmacy Consult for warfarin Indication: DVT  Allergies  Allergen Reactions  . Amoxicillin Anaphylaxis and Rash  . Darvocet [Propoxyphene N-Acetaminophen] Other (See Comments)    hallucinations  . Penicillins Anaphylaxis and Rash    Has patient had a PCN reaction causing immediate rash, facial/tongue/throat swelling, SOB or lightheadedness with hypotension: Yes Has patient had a PCN reaction causing severe rash involving mucus membranes or skin necrosis: No Has patient had a PCN reaction that required hospitalization No Has patient had a PCN reaction occurring within the last 10 years: No If all of the above answers are "NO", then may proceed with Cephalosporin use.   . Sulfa Antibiotics Anaphylaxis and Rash  . Propoxyphene Other (See Comments)    hallucinations   Patient Measurements: Weight: 152 lb 5.4 oz (69.1 kg)  Vital Signs: Temp: 98.8 F (37.1 C) (03/21 0606) Temp Source: Oral (03/21 0606) BP: 168/74 (03/21 0606) Pulse Rate: 67 (03/21 0606)  Labs:  Recent Labs  12/01/16 0531 12/02/16 0600 12/03/16 0600  LABPROT 30.1* 36.0* 32.7*  INR 2.80 3.50 3.10   Estimated Creatinine Clearance: 50.9 mL/min (by C-G formula based on SCr of 0.65 mg/dL).  Medical History: Past Medical History:  Diagnosis Date  . Alzheimer's dementia   . Anxiety   . Arthritis   . Bronchiectasis (HCC) 2014  . COPD (chronic obstructive pulmonary disease) (HCC)   . DVT (deep venous thrombosis) (HCC) 2008   LLE  . Gout   . Headache(784.0)   . Hypertension   . IBS (irritable bowel syndrome)   . MAI (mycobacterium avium-intracellulare) infection (HCC) 2014  . Mold exposure 06/20/2015  . Pneumonia 04/2013   Medications:  Prescriptions Prior to Admission  Medication Sig Dispense Refill Last Dose  . acetaminophen (TYLENOL) 500 MG tablet Take 500 mg by mouth every 6 (six) hours as needed (pain).   11/19/2016  . ALPRAZolam (XANAX) 0.5 MG tablet Take 0.5 mg by  mouth every 6 (six) hours as needed for anxiety.   11/24/2016  . azithromycin (ZITHROMAX) 500 MG tablet Take 500 mg by mouth 3 (three) times a week. Monday, Wednesday, & Friday   11/26/2016 at Unknown time  . divalproex (DEPAKOTE SPRINKLE) 125 MG capsule Take 250 mg by mouth 2 (two) times daily.   11/26/2016 at Unknown time  . dorzolamide (TRUSOPT) 2 % ophthalmic solution Place 1 drop into both eyes 2 (two) times daily.   11/27/2016 at Unknown time  . ethambutol (MYAMBUTOL) 400 MG tablet Take 1,600 mg by mouth 3 (three) times a week. Monday, Wednesday, & Friday   11/26/2016 at Unknown time  . haloperidol (HALDOL) 0.5 MG tablet Take 0.5 mg by mouth 2 (two) times daily.   11/27/2016 at Unknown time  . ipratropium-albuterol (DUONEB) 0.5-2.5 (3) MG/3ML SOLN Take 3 mLs by nebulization every 6 (six) hours as needed (SOB).   11/19/2016  . latanoprost (XALATAN) 0.005 % ophthalmic solution Place 1 drop into both eyes at bedtime.   11/26/2016 at Unknown time  . metoprolol tartrate (LOPRESSOR) 25 MG tablet Take 25 mg by mouth 2 (two) times daily.    11/27/2016 at 0900  . PARoxetine (PAXIL) 20 MG tablet Take 20 mg by mouth daily.   11/26/2016 at Unknown time  . rifampin (RIFADIN) 300 MG capsule Take 600 mg by mouth 3 (three) times a week. Monday, Wednesday, & Friday   11/26/2016 at Unknown time  . spironolactone (ALDACTONE) 25 MG tablet Take 25 mg by mouth daily.  11/27/2016 at Unknown time  . warfarin (COUMADIN) 2.5 MG tablet Take 2.5 mg by mouth one time only at 6 PM.    11/26/2016 at 1800  . levofloxacin (LEVAQUIN) 750 MG tablet Take 1 tablet (750 mg total) by mouth every other day. (Patient not taking: Reported on 11/27/2016) 3 tablet 0 Completed Course at Unknown time  . predniSONE (DELTASONE) 50 MG tablet Take one tablet by mouth once a day (Patient not taking: Reported on 11/27/2016) 3 tablet 0 Not Taking at Unknown time   Scheduled:  . azithromycin  500 mg Oral Once per day on Mon Wed Fri  . aztreonam  1 g  Intravenous Q8H  . divalproex  250 mg Oral Q8H  . dorzolamide  1 drop Both Eyes BID  . ethambutol  1,600 mg Oral Once per day on Mon Wed Fri  . latanoprost  1 drop Both Eyes QHS  . metoprolol tartrate  25 mg Oral BID  . OLANZapine zydis  5 mg Oral BID  . PARoxetine  20 mg Oral Daily  . rifampin  600 mg Oral Once per day on Mon Wed Fri  . Warfarin - Pharmacist Dosing Inpatient   Does not apply q1800   Assessment: Patient to ED 3/15 with acute encephalopathy, 10 day hx of agitation, restlessness at SNF)  History of DVT on chronic warfarin.  INR on admit > 4. Home dose 2.5mg /day, LD 3/14 (SNF) Warfarin per pharmacy protocol.    Today, 12/03/2016  INR 3.1- supra-therapeutic again today, but trending down after dose held 3/20.  CBC low but stable, Plt wnl but trending down (3/18)  No sign of bleeding noted  Cardiac diet ordered- 0-20% of meal intake charted   Azithromycin and rifampin (on PTA) and broad-spectrum antibiotics can increase the effects of warfarin  Goal of Therapy:  INR 2-3   Plan:  Hold Warfarin again tonight Daily INR  Junita PushMichelle Javaun Dimperio, PharmD, BCPS Pager: [phone] 12/03/2016, 7:58 AM

## 2016-12-03 NOTE — Evaluation (Signed)
Clinical/Bedside Swallow Evaluation Patient Details  Name: Stacie Wright Shearn MRN: [id number] Date of Birth: Apr 17, 1928  Today's Date: 12/03/2016 Time: SLP Start Time (ACUTE ONLY): 1208 SLP Stop Time (ACUTE ONLY): 1232 SLP Time Calculation (min) (ACUTE ONLY): 24 min  Past Medical History:  Past Medical History:  Diagnosis Date  . Alzheimer's dementia   . Anxiety   . Arthritis   . Bronchiectasis (HCC) 2014  . COPD (chronic obstructive pulmonary disease) (HCC)   . DVT (deep venous thrombosis) (HCC) 2008   LLE  . Gout   . Headache(784.0)   . Hypertension   . IBS (irritable bowel syndrome)   . MAI (mycobacterium avium-intracellulare) infection (HCC) 2014  . Mold exposure 06/20/2015  . Pneumonia 04/2013   Past Surgical History:  Past Surgical History:  Procedure Laterality Date  . ABDOMINAL HYSTERECTOMY    . CATARACT EXTRACTION    . CHOLECYSTECTOMY    . FRACTURE SURGERY     HPI:  Pt is a 83 year old female with PMH of dementia and bronchiectasis with COPD and concern for MAI who presented from SNF with SOB and hypotension. Patient had 2 days of cough and increase WOB. Found to have a sat of 71% on RA on presentation. Patient is on BiPAP and has severe dementia and unable to provide further history. Son is HCPOA. CXR showed right upper lobe and bilateral lower lobe infiltrates noted consistent with pneumonia. Findings have progressed significantly from prior exam. Small right pleural effusion. Barium esophagram on 11/20/2014 showed diffuse age-related esophageal dysmotility. Patient unable to swallow a 12.5 mm barium tablet. Vallecular residuals. No prior ST notes.   Assessment / Plan / Recommendation Clinical Impression  Pt with baseline cough and subtle cough during intake.  Chronic dysphagia suspected for which pt compensates.    Generalized weakness results in pt having difficulty forming suction on straw.  She was able to swallow a few boluses of juice via cup and eat peaches/roll -  feeding herself.  Mild oral deficits apparent and pt has Wright/o esophageal dysmotility.    Currently pt is demonstrating symptoms of mild oral dysphagia = she reports Wright/o dysphagia.  Given age, chronic nature of difficulties, recommend continue SOFT/PUREED MEATS/THIN diet with precautions.    Follow up with SlP at SNF may be helpful to mitigate risks and reinforce helfpul compensation strategies.  Note recommendation for palliative follow up at SNF.   SLP Visit Diagnosis: Dysphagia, oral phase (R13.11)    Aspiration Risk  Mild aspiration risk;Moderate aspiration risk    Diet Recommendation Regular;Dysphagia 3 (Mech soft);Thin liquid   Liquid Administration via: Cup;Straw Medication Administration:  (as tolerated) Supervision: Patient able to self feed Compensations: Slow rate;Small sips/bites;Other (Comment) (check for oral residuals) Postural Changes: Seated upright at 90 degrees;Remain upright for at least 30 minutes after po intake    Other  Recommendations Oral Care Recommendations: Oral care BID   Follow up Recommendations Skilled Nursing facility      Frequency and Duration   n/a         Prognosis   to dc to snf today     Swallow Study   General Date of Onset: 12/03/16 HPI: Pt is a 83 year old female with PMH of dementia and bronchiectasis with COPD and concern for MAI who presented from SNF with SOB and hypotension. Patient had 2 days of cough and increase WOB. Found to have a sat of 71% on RA on presentation. Patient is on BiPAP and has severe dementia and  unable to provide further history. Son is HCPOA. CXR showed right upper lobe and bilateral lower lobe infiltrates noted consistent with pneumonia. Findings have progressed significantly from prior exam. Small right pleural effusion. Barium esophagram on 11/20/2014 showed diffuse age-related esophageal dysmotility. Patient unable to swallow a 12.5 mm barium tablet. Vallecular residuals. No prior ST notes. Type of Study:  Bedside Swallow Evaluation Diet Prior to this Study: Regular;Thin liquids Temperature Spikes Noted: No Respiratory Status: Room air History of Recent Intubation: No Behavior/Cognition: Alert;Cooperative;Pleasant mood Oral Cavity Assessment: Within Functional Limits Oral Care Completed by SLP: No (pt eating lunch) Oral Cavity - Dentition: Adequate natural dentition Vision: Functional for self-feeding Self-Feeding Abilities: Able to feed self Patient Positioning: Upright in bed Baseline Vocal Quality: Low vocal intensity Volitional Cough: Strong (not productive)    Oral/Motor/Sensory Function Overall Oral Motor/Sensory Function: Within functional limits   Ice Chips Ice chips: Not tested   Thin Liquid Thin Liquid: Impaired Presentation: Cup;Self Fed Pharyngeal  Phase Impairments: Suspected delayed Swallow;Cough - Delayed    Nectar Thick Nectar Thick Liquid: Not tested   Honey Thick Honey Thick Liquid: Not tested   Puree Puree: Impaired Pharyngeal Phase Impairments: Suspected delayed Swallow   Solid   GO   Solid: Impaired Presentation: Self Fed Oral Phase Impairments: Impaired mastication;Reduced lingual movement/coordination Oral Phase Functional Implications: Prolonged oral transit;Oral residue Pharyngeal Phase Impairments: Suspected delayed Swallow;Cough - Delayed        Mills Koller, MS [facility] SLP [phone]

## 2016-12-03 NOTE — Progress Notes (Signed)
Pt belongs were left in the room. Pt belongings were labeled and the son Stacie HarveyRandy Schickling  Was contacted and is aware that belongings were left behind.

## 2016-12-03 NOTE — Clinical Social Work Placement (Addendum)
   CLINICAL SOCIAL WORK PLACEMENT  NOTE  Date:  12/03/2016  Patient Details  Name: Stacie Wright MRN: [id number] Date of Birth: [birth year]  Clinical Social Work is seeking post-discharge placement for this patient at the Skilled  Nursing Facility level of care (*CSW will initial, date and re-position this form in  chart as items are completed):  No   Patient/family provided with [facility] Clinical Social Work Department's list of facilities offering this level of care within the geographic area requested by the patient (or if unable, by the patient's family).  Yes   Patient/family informed of their freedom to choose among providers that offer the needed level of care, that participate in Medicare, Medicaid or managed care program needed by the patient, have an available bed and are willing to accept the patient.  Yes   Patient/family informed of [place]'s ownership interest in [facility] and [facility], as well as of the fact that they are under no obligation to receive care at these facilities.  PASRR submitted to EDS on       PASRR number received on       Existing PASRR number confirmed on 12/03/16     FL2 transmitted to all facilities in geographic area requested by pt/family on       FL2 transmitted to all facilities within larger geographic area on       Patient informed that his/her managed care company has contracts with or will negotiate with certain facilities, including the following:           Patient/family informed of bed offers received.  [facility]     Physician recommends and patient chooses bed at      Patient to be [facility] on  3.21.18  Patient to be transferred to facility by [facility]     Patient family notified on  12/03/16 of transfer.  Name of family member notified:   Randy     PHYSICIAN Please sign DNR, Please prepare priority discharge summary, including medications      Additional Comment:    _______________________________________________ Clearance CootsNicole A Dosia Yodice, LCSW 12/03/2016, 9:42 AM

## 2016-12-10 ENCOUNTER — Other Ambulatory Visit: Payer: Self-pay | Admitting: *Deleted

## 2016-12-10 NOTE — Patient Outreach (Signed)
[facility] Care Management  12/10/2016  Scotty CourtDorothy H Loney 05-26-1928 [id number]  Referral via [facility] Inpatient Discharges:  Per chart review patient was recently discharged from inpatient admission on 12/03/2016 & transferred [facility] for [facility] services.  Telephone call to [facility] in [place]- spoke with Social Worker-Sarah who advised that patient was in skilled bed currently and that plan was for long term care in facility when skilled services completed.  Facility does provide case management services.   Plan: Close case.  Colleen CanLinda Shylie Polo, RN BSN CCM Care Management Coordinator [facility] Care Management  [phone]

## 2016-12-11 DIAGNOSIS — Z7901 Long term (current) use of anticoagulants: Secondary | ICD-10-CM | POA: Diagnosis not present

## 2016-12-11 DIAGNOSIS — I4891 Unspecified atrial fibrillation: Secondary | ICD-10-CM | POA: Diagnosis not present

## 2016-12-18 DIAGNOSIS — D649 Anemia, unspecified: Secondary | ICD-10-CM | POA: Diagnosis not present

## 2016-12-18 DIAGNOSIS — Z7901 Long term (current) use of anticoagulants: Secondary | ICD-10-CM | POA: Diagnosis not present

## 2016-12-18 DIAGNOSIS — I10 Essential (primary) hypertension: Secondary | ICD-10-CM | POA: Diagnosis not present

## 2016-12-18 DIAGNOSIS — I4891 Unspecified atrial fibrillation: Secondary | ICD-10-CM | POA: Diagnosis not present

## 2016-12-23 DIAGNOSIS — I739 Peripheral vascular disease, unspecified: Secondary | ICD-10-CM | POA: Diagnosis not present

## 2016-12-23 DIAGNOSIS — N390 Urinary tract infection, site not specified: Secondary | ICD-10-CM | POA: Diagnosis not present

## 2016-12-23 DIAGNOSIS — R319 Hematuria, unspecified: Secondary | ICD-10-CM | POA: Diagnosis not present

## 2016-12-23 DIAGNOSIS — E039 Hypothyroidism, unspecified: Secondary | ICD-10-CM | POA: Diagnosis not present

## 2016-12-23 DIAGNOSIS — F0280 Dementia in other diseases classified elsewhere without behavioral disturbance: Secondary | ICD-10-CM | POA: Diagnosis not present

## 2016-12-23 DIAGNOSIS — G301 Alzheimer's disease with late onset: Secondary | ICD-10-CM | POA: Diagnosis not present

## 2016-12-25 DIAGNOSIS — I4891 Unspecified atrial fibrillation: Secondary | ICD-10-CM | POA: Diagnosis not present

## 2016-12-25 DIAGNOSIS — Z7901 Long term (current) use of anticoagulants: Secondary | ICD-10-CM | POA: Diagnosis not present

## 2017-01-01 DIAGNOSIS — Z7901 Long term (current) use of anticoagulants: Secondary | ICD-10-CM | POA: Diagnosis not present

## 2017-01-01 DIAGNOSIS — Z86718 Personal history of other venous thrombosis and embolism: Secondary | ICD-10-CM | POA: Diagnosis not present

## 2017-01-01 DIAGNOSIS — I4891 Unspecified atrial fibrillation: Secondary | ICD-10-CM | POA: Diagnosis not present

## 2017-01-08 DIAGNOSIS — I4891 Unspecified atrial fibrillation: Secondary | ICD-10-CM | POA: Diagnosis not present

## 2017-01-08 DIAGNOSIS — Z7901 Long term (current) use of anticoagulants: Secondary | ICD-10-CM | POA: Diagnosis not present

## 2017-01-15 DIAGNOSIS — I4891 Unspecified atrial fibrillation: Secondary | ICD-10-CM | POA: Diagnosis not present

## 2017-01-15 DIAGNOSIS — Z7901 Long term (current) use of anticoagulants: Secondary | ICD-10-CM | POA: Diagnosis not present

## 2017-01-22 DIAGNOSIS — R05 Cough: Secondary | ICD-10-CM | POA: Diagnosis not present

## 2017-01-22 DIAGNOSIS — I4891 Unspecified atrial fibrillation: Secondary | ICD-10-CM | POA: Diagnosis not present

## 2017-01-22 DIAGNOSIS — Z7901 Long term (current) use of anticoagulants: Secondary | ICD-10-CM | POA: Diagnosis not present

## 2017-01-29 DIAGNOSIS — Z7901 Long term (current) use of anticoagulants: Secondary | ICD-10-CM | POA: Diagnosis not present

## 2017-01-29 DIAGNOSIS — J9611 Chronic respiratory failure with hypoxia: Secondary | ICD-10-CM | POA: Diagnosis not present

## 2017-01-29 DIAGNOSIS — I4891 Unspecified atrial fibrillation: Secondary | ICD-10-CM | POA: Diagnosis not present

## 2017-01-29 DIAGNOSIS — A310 Pulmonary mycobacterial infection: Secondary | ICD-10-CM | POA: Diagnosis not present

## 2017-01-29 DIAGNOSIS — F0280 Dementia in other diseases classified elsewhere without behavioral disturbance: Secondary | ICD-10-CM | POA: Diagnosis not present

## 2017-01-29 DIAGNOSIS — F329 Major depressive disorder, single episode, unspecified: Secondary | ICD-10-CM | POA: Diagnosis not present

## 2017-01-30 DIAGNOSIS — I10 Essential (primary) hypertension: Secondary | ICD-10-CM | POA: Diagnosis not present

## 2017-01-30 DIAGNOSIS — D649 Anemia, unspecified: Secondary | ICD-10-CM | POA: Diagnosis not present

## 2017-02-05 DIAGNOSIS — Z7901 Long term (current) use of anticoagulants: Secondary | ICD-10-CM | POA: Diagnosis not present

## 2017-02-05 DIAGNOSIS — I4891 Unspecified atrial fibrillation: Secondary | ICD-10-CM | POA: Diagnosis not present

## 2017-02-09 DIAGNOSIS — Z7901 Long term (current) use of anticoagulants: Secondary | ICD-10-CM | POA: Diagnosis not present

## 2017-02-09 DIAGNOSIS — I4891 Unspecified atrial fibrillation: Secondary | ICD-10-CM | POA: Diagnosis not present

## 2017-02-12 DIAGNOSIS — I4891 Unspecified atrial fibrillation: Secondary | ICD-10-CM | POA: Diagnosis not present

## 2017-02-12 DIAGNOSIS — E559 Vitamin D deficiency, unspecified: Secondary | ICD-10-CM | POA: Diagnosis not present

## 2017-02-12 DIAGNOSIS — Z7901 Long term (current) use of anticoagulants: Secondary | ICD-10-CM | POA: Diagnosis not present

## 2017-02-19 DIAGNOSIS — Z7901 Long term (current) use of anticoagulants: Secondary | ICD-10-CM | POA: Diagnosis not present

## 2017-02-19 DIAGNOSIS — I4891 Unspecified atrial fibrillation: Secondary | ICD-10-CM | POA: Diagnosis not present

## 2017-02-21 DIAGNOSIS — R05 Cough: Secondary | ICD-10-CM | POA: Diagnosis not present

## 2017-02-21 DIAGNOSIS — R0989 Other specified symptoms and signs involving the circulatory and respiratory systems: Secondary | ICD-10-CM | POA: Diagnosis not present

## 2017-03-19 DIAGNOSIS — I10 Essential (primary) hypertension: Secondary | ICD-10-CM | POA: Diagnosis not present

## 2017-03-19 DIAGNOSIS — Z79899 Other long term (current) drug therapy: Secondary | ICD-10-CM | POA: Diagnosis not present

## 2017-03-19 DIAGNOSIS — I4891 Unspecified atrial fibrillation: Secondary | ICD-10-CM | POA: Diagnosis not present

## 2017-03-19 DIAGNOSIS — E785 Hyperlipidemia, unspecified: Secondary | ICD-10-CM | POA: Diagnosis not present

## 2017-03-19 DIAGNOSIS — M6281 Muscle weakness (generalized): Secondary | ICD-10-CM | POA: Diagnosis not present

## 2017-03-19 DIAGNOSIS — E119 Type 2 diabetes mellitus without complications: Secondary | ICD-10-CM | POA: Diagnosis not present

## 2017-03-19 DIAGNOSIS — F419 Anxiety disorder, unspecified: Secondary | ICD-10-CM | POA: Diagnosis not present

## 2017-03-19 DIAGNOSIS — Z7901 Long term (current) use of anticoagulants: Secondary | ICD-10-CM | POA: Diagnosis not present

## 2017-03-19 DIAGNOSIS — R52 Pain, unspecified: Secondary | ICD-10-CM | POA: Diagnosis not present

## 2017-04-16 DIAGNOSIS — Z7901 Long term (current) use of anticoagulants: Secondary | ICD-10-CM | POA: Diagnosis not present

## 2017-04-30 DIAGNOSIS — Z7901 Long term (current) use of anticoagulants: Secondary | ICD-10-CM | POA: Diagnosis not present

## 2017-05-14 DIAGNOSIS — Z7901 Long term (current) use of anticoagulants: Secondary | ICD-10-CM | POA: Diagnosis not present

## 2017-05-30 DIAGNOSIS — R05 Cough: Secondary | ICD-10-CM | POA: Diagnosis not present

## 2017-05-30 DIAGNOSIS — R0989 Other specified symptoms and signs involving the circulatory and respiratory systems: Secondary | ICD-10-CM | POA: Diagnosis not present

## 2017-05-30 DIAGNOSIS — R509 Fever, unspecified: Secondary | ICD-10-CM | POA: Diagnosis not present

## 2017-06-04 DIAGNOSIS — Z7901 Long term (current) use of anticoagulants: Secondary | ICD-10-CM | POA: Diagnosis not present

## 2017-06-17 DIAGNOSIS — Z792 Long term (current) use of antibiotics: Secondary | ICD-10-CM | POA: Diagnosis not present

## 2017-06-17 DIAGNOSIS — I4891 Unspecified atrial fibrillation: Secondary | ICD-10-CM | POA: Diagnosis not present

## 2017-09-10 DIAGNOSIS — G309 Alzheimer's disease, unspecified: Secondary | ICD-10-CM | POA: Diagnosis not present

## 2017-09-10 DIAGNOSIS — J449 Chronic obstructive pulmonary disease, unspecified: Secondary | ICD-10-CM | POA: Diagnosis not present

## 2017-09-10 DIAGNOSIS — E43 Unspecified severe protein-calorie malnutrition: Secondary | ICD-10-CM | POA: Diagnosis not present

## 2017-09-10 DIAGNOSIS — Z9981 Dependence on supplemental oxygen: Secondary | ICD-10-CM | POA: Diagnosis not present

## 2017-11-13 DIAGNOSIS — Z79899 Other long term (current) drug therapy: Secondary | ICD-10-CM | POA: Diagnosis not present

## 2017-11-17 DIAGNOSIS — F419 Anxiety disorder, unspecified: Secondary | ICD-10-CM | POA: Diagnosis not present

## 2017-11-17 DIAGNOSIS — F0391 Unspecified dementia with behavioral disturbance: Secondary | ICD-10-CM | POA: Diagnosis not present

## 2017-11-17 DIAGNOSIS — J449 Chronic obstructive pulmonary disease, unspecified: Secondary | ICD-10-CM | POA: Diagnosis not present

## 2017-11-17 DIAGNOSIS — Z86718 Personal history of other venous thrombosis and embolism: Secondary | ICD-10-CM | POA: Diagnosis not present

## 2017-11-18 DIAGNOSIS — A310 Pulmonary mycobacterial infection: Secondary | ICD-10-CM | POA: Diagnosis not present

## 2017-11-18 DIAGNOSIS — D649 Anemia, unspecified: Secondary | ICD-10-CM | POA: Diagnosis not present

## 2017-11-18 DIAGNOSIS — R0989 Other specified symptoms and signs involving the circulatory and respiratory systems: Secondary | ICD-10-CM | POA: Diagnosis not present

## 2017-11-18 DIAGNOSIS — R05 Cough: Secondary | ICD-10-CM | POA: Diagnosis not present

## 2017-11-19 DIAGNOSIS — N390 Urinary tract infection, site not specified: Secondary | ICD-10-CM | POA: Diagnosis not present

## 2017-11-19 DIAGNOSIS — R319 Hematuria, unspecified: Secondary | ICD-10-CM | POA: Diagnosis not present

## 2017-11-28 DIAGNOSIS — J479 Bronchiectasis, uncomplicated: Secondary | ICD-10-CM | POA: Diagnosis not present

## 2017-11-28 DIAGNOSIS — A310 Pulmonary mycobacterial infection: Secondary | ICD-10-CM | POA: Diagnosis not present

## 2017-11-28 DIAGNOSIS — D649 Anemia, unspecified: Secondary | ICD-10-CM | POA: Diagnosis not present

## 2017-12-15 DIAGNOSIS — E43 Unspecified severe protein-calorie malnutrition: Secondary | ICD-10-CM | POA: Diagnosis not present

## 2017-12-15 DIAGNOSIS — G309 Alzheimer's disease, unspecified: Secondary | ICD-10-CM | POA: Diagnosis not present

## 2017-12-15 DIAGNOSIS — A310 Pulmonary mycobacterial infection: Secondary | ICD-10-CM | POA: Diagnosis not present

## 2017-12-15 DIAGNOSIS — F0281 Dementia in other diseases classified elsewhere with behavioral disturbance: Secondary | ICD-10-CM | POA: Diagnosis not present

## 2018-05-20 DIAGNOSIS — N390 Urinary tract infection, site not specified: Secondary | ICD-10-CM | POA: Diagnosis not present

## 2018-05-20 DIAGNOSIS — D649 Anemia, unspecified: Secondary | ICD-10-CM | POA: Diagnosis not present

## 2018-05-20 DIAGNOSIS — M6281 Muscle weakness (generalized): Secondary | ICD-10-CM | POA: Diagnosis not present

## 2018-05-20 DIAGNOSIS — J9611 Chronic respiratory failure with hypoxia: Secondary | ICD-10-CM | POA: Diagnosis not present

## 2018-06-07 DIAGNOSIS — E43 Unspecified severe protein-calorie malnutrition: Secondary | ICD-10-CM | POA: Diagnosis not present

## 2018-06-07 DIAGNOSIS — G309 Alzheimer's disease, unspecified: Secondary | ICD-10-CM | POA: Diagnosis not present

## 2018-06-07 DIAGNOSIS — J479 Bronchiectasis, uncomplicated: Secondary | ICD-10-CM | POA: Diagnosis not present

## 2018-06-07 DIAGNOSIS — J9611 Chronic respiratory failure with hypoxia: Secondary | ICD-10-CM | POA: Diagnosis not present

## 2018-06-25 DIAGNOSIS — I10 Essential (primary) hypertension: Secondary | ICD-10-CM | POA: Diagnosis not present

## 2018-06-25 DIAGNOSIS — J449 Chronic obstructive pulmonary disease, unspecified: Secondary | ICD-10-CM | POA: Diagnosis not present

## 2018-06-25 DIAGNOSIS — G309 Alzheimer's disease, unspecified: Secondary | ICD-10-CM | POA: Diagnosis not present

## 2018-06-25 DIAGNOSIS — J479 Bronchiectasis, uncomplicated: Secondary | ICD-10-CM | POA: Diagnosis not present

## 2018-06-25 DIAGNOSIS — R05 Cough: Secondary | ICD-10-CM | POA: Diagnosis not present

## 2018-06-29 DIAGNOSIS — R05 Cough: Secondary | ICD-10-CM | POA: Diagnosis not present

## 2018-06-29 DIAGNOSIS — R0981 Nasal congestion: Secondary | ICD-10-CM | POA: Diagnosis not present

## 2018-07-01 DIAGNOSIS — I10 Essential (primary) hypertension: Secondary | ICD-10-CM | POA: Diagnosis not present

## 2018-07-01 DIAGNOSIS — J479 Bronchiectasis, uncomplicated: Secondary | ICD-10-CM | POA: Diagnosis not present

## 2018-07-01 DIAGNOSIS — G309 Alzheimer's disease, unspecified: Secondary | ICD-10-CM | POA: Diagnosis not present

## 2018-07-01 DIAGNOSIS — R05 Cough: Secondary | ICD-10-CM | POA: Diagnosis not present

## 2018-07-29 DIAGNOSIS — J9611 Chronic respiratory failure with hypoxia: Secondary | ICD-10-CM | POA: Diagnosis not present

## 2018-07-29 DIAGNOSIS — F0280 Dementia in other diseases classified elsewhere without behavioral disturbance: Secondary | ICD-10-CM | POA: Diagnosis not present

## 2018-07-29 DIAGNOSIS — I10 Essential (primary) hypertension: Secondary | ICD-10-CM | POA: Diagnosis not present

## 2018-08-13 DIAGNOSIS — J449 Chronic obstructive pulmonary disease, unspecified: Secondary | ICD-10-CM | POA: Diagnosis not present

## 2018-08-13 DIAGNOSIS — G309 Alzheimer's disease, unspecified: Secondary | ICD-10-CM | POA: Diagnosis not present

## 2018-08-13 DIAGNOSIS — I10 Essential (primary) hypertension: Secondary | ICD-10-CM | POA: Diagnosis not present

## 2018-08-26 DIAGNOSIS — J449 Chronic obstructive pulmonary disease, unspecified: Secondary | ICD-10-CM | POA: Diagnosis not present

## 2018-08-26 DIAGNOSIS — G309 Alzheimer's disease, unspecified: Secondary | ICD-10-CM | POA: Diagnosis not present

## 2018-08-26 DIAGNOSIS — J9611 Chronic respiratory failure with hypoxia: Secondary | ICD-10-CM | POA: Diagnosis not present

## 2018-08-26 DIAGNOSIS — I10 Essential (primary) hypertension: Secondary | ICD-10-CM | POA: Diagnosis not present

## 2018-09-03 DIAGNOSIS — G308 Other Alzheimer's disease: Secondary | ICD-10-CM | POA: Diagnosis not present

## 2018-09-03 DIAGNOSIS — J449 Chronic obstructive pulmonary disease, unspecified: Secondary | ICD-10-CM | POA: Diagnosis not present

## 2018-09-03 DIAGNOSIS — J479 Bronchiectasis, uncomplicated: Secondary | ICD-10-CM | POA: Diagnosis not present

## 2018-09-03 DIAGNOSIS — F419 Anxiety disorder, unspecified: Secondary | ICD-10-CM | POA: Diagnosis not present

## 2018-09-23 DIAGNOSIS — J449 Chronic obstructive pulmonary disease, unspecified: Secondary | ICD-10-CM | POA: Diagnosis not present

## 2018-09-23 DIAGNOSIS — F419 Anxiety disorder, unspecified: Secondary | ICD-10-CM | POA: Diagnosis not present

## 2018-09-23 DIAGNOSIS — J479 Bronchiectasis, uncomplicated: Secondary | ICD-10-CM | POA: Diagnosis not present

## 2018-09-23 DIAGNOSIS — G309 Alzheimer's disease, unspecified: Secondary | ICD-10-CM | POA: Diagnosis not present

## 2018-10-01 DIAGNOSIS — I4891 Unspecified atrial fibrillation: Secondary | ICD-10-CM | POA: Diagnosis not present

## 2018-10-01 DIAGNOSIS — Z7901 Long term (current) use of anticoagulants: Secondary | ICD-10-CM | POA: Diagnosis not present

## 2018-10-13 DIAGNOSIS — I10 Essential (primary) hypertension: Secondary | ICD-10-CM | POA: Diagnosis not present

## 2018-10-13 DIAGNOSIS — J449 Chronic obstructive pulmonary disease, unspecified: Secondary | ICD-10-CM | POA: Diagnosis not present

## 2018-10-13 DIAGNOSIS — J479 Bronchiectasis, uncomplicated: Secondary | ICD-10-CM | POA: Diagnosis not present

## 2018-10-13 DIAGNOSIS — G309 Alzheimer's disease, unspecified: Secondary | ICD-10-CM | POA: Diagnosis not present

## 2018-11-12 DIAGNOSIS — F329 Major depressive disorder, single episode, unspecified: Secondary | ICD-10-CM | POA: Diagnosis not present

## 2018-11-12 DIAGNOSIS — G309 Alzheimer's disease, unspecified: Secondary | ICD-10-CM | POA: Diagnosis not present

## 2018-11-12 DIAGNOSIS — J449 Chronic obstructive pulmonary disease, unspecified: Secondary | ICD-10-CM | POA: Diagnosis not present

## 2018-11-12 DIAGNOSIS — F419 Anxiety disorder, unspecified: Secondary | ICD-10-CM | POA: Diagnosis not present

## 2018-11-18 DIAGNOSIS — I10 Essential (primary) hypertension: Secondary | ICD-10-CM | POA: Diagnosis not present

## 2018-11-18 DIAGNOSIS — F419 Anxiety disorder, unspecified: Secondary | ICD-10-CM | POA: Diagnosis not present

## 2018-11-18 DIAGNOSIS — J479 Bronchiectasis, uncomplicated: Secondary | ICD-10-CM | POA: Diagnosis not present

## 2018-11-18 DIAGNOSIS — G309 Alzheimer's disease, unspecified: Secondary | ICD-10-CM | POA: Diagnosis not present

## 2018-11-18 DIAGNOSIS — J449 Chronic obstructive pulmonary disease, unspecified: Secondary | ICD-10-CM | POA: Diagnosis not present

## 2018-11-18 DIAGNOSIS — D649 Anemia, unspecified: Secondary | ICD-10-CM | POA: Diagnosis not present

## 2018-11-18 DIAGNOSIS — Z7901 Long term (current) use of anticoagulants: Secondary | ICD-10-CM | POA: Diagnosis not present

## 2018-11-18 DIAGNOSIS — A310 Pulmonary mycobacterial infection: Secondary | ICD-10-CM | POA: Diagnosis not present

## 2018-12-11 DIAGNOSIS — N390 Urinary tract infection, site not specified: Secondary | ICD-10-CM | POA: Diagnosis not present

## 2018-12-11 DIAGNOSIS — Z79899 Other long term (current) drug therapy: Secondary | ICD-10-CM | POA: Diagnosis not present

## 2018-12-11 DIAGNOSIS — R319 Hematuria, unspecified: Secondary | ICD-10-CM | POA: Diagnosis not present

## 2018-12-17 DIAGNOSIS — N390 Urinary tract infection, site not specified: Secondary | ICD-10-CM | POA: Diagnosis not present

## 2018-12-17 DIAGNOSIS — G8929 Other chronic pain: Secondary | ICD-10-CM | POA: Diagnosis not present

## 2018-12-17 DIAGNOSIS — J479 Bronchiectasis, uncomplicated: Secondary | ICD-10-CM | POA: Diagnosis not present

## 2018-12-17 DIAGNOSIS — J449 Chronic obstructive pulmonary disease, unspecified: Secondary | ICD-10-CM | POA: Diagnosis not present

## 2019-01-11 DIAGNOSIS — J449 Chronic obstructive pulmonary disease, unspecified: Secondary | ICD-10-CM | POA: Diagnosis not present

## 2019-01-11 DIAGNOSIS — J479 Bronchiectasis, uncomplicated: Secondary | ICD-10-CM | POA: Diagnosis not present

## 2019-01-11 DIAGNOSIS — G8929 Other chronic pain: Secondary | ICD-10-CM | POA: Diagnosis not present

## 2019-01-11 DIAGNOSIS — B372 Candidiasis of skin and nail: Secondary | ICD-10-CM | POA: Diagnosis not present

## 2019-01-13 DIAGNOSIS — B372 Candidiasis of skin and nail: Secondary | ICD-10-CM | POA: Diagnosis not present

## 2019-01-13 DIAGNOSIS — E43 Unspecified severe protein-calorie malnutrition: Secondary | ICD-10-CM | POA: Diagnosis not present

## 2019-01-13 DIAGNOSIS — J479 Bronchiectasis, uncomplicated: Secondary | ICD-10-CM | POA: Diagnosis not present

## 2019-01-13 DIAGNOSIS — G8929 Other chronic pain: Secondary | ICD-10-CM | POA: Diagnosis not present

## 2019-01-25 DIAGNOSIS — G8929 Other chronic pain: Secondary | ICD-10-CM | POA: Diagnosis not present

## 2019-01-25 DIAGNOSIS — J479 Bronchiectasis, uncomplicated: Secondary | ICD-10-CM | POA: Diagnosis not present

## 2019-01-25 DIAGNOSIS — J449 Chronic obstructive pulmonary disease, unspecified: Secondary | ICD-10-CM | POA: Diagnosis not present

## 2019-01-25 DIAGNOSIS — E43 Unspecified severe protein-calorie malnutrition: Secondary | ICD-10-CM | POA: Diagnosis not present

## 2019-02-04 DIAGNOSIS — F329 Major depressive disorder, single episode, unspecified: Secondary | ICD-10-CM | POA: Diagnosis not present

## 2019-02-04 DIAGNOSIS — E43 Unspecified severe protein-calorie malnutrition: Secondary | ICD-10-CM | POA: Diagnosis not present

## 2019-02-04 DIAGNOSIS — J449 Chronic obstructive pulmonary disease, unspecified: Secondary | ICD-10-CM | POA: Diagnosis not present

## 2019-02-04 DIAGNOSIS — G309 Alzheimer's disease, unspecified: Secondary | ICD-10-CM | POA: Diagnosis not present

## 2019-02-10 DIAGNOSIS — J449 Chronic obstructive pulmonary disease, unspecified: Secondary | ICD-10-CM | POA: Diagnosis not present

## 2019-02-10 DIAGNOSIS — E43 Unspecified severe protein-calorie malnutrition: Secondary | ICD-10-CM | POA: Diagnosis not present

## 2019-02-10 DIAGNOSIS — F329 Major depressive disorder, single episode, unspecified: Secondary | ICD-10-CM | POA: Diagnosis not present

## 2019-02-10 DIAGNOSIS — G309 Alzheimer's disease, unspecified: Secondary | ICD-10-CM | POA: Diagnosis not present

## 2019-03-04 DIAGNOSIS — G4700 Insomnia, unspecified: Secondary | ICD-10-CM | POA: Diagnosis not present

## 2019-03-04 DIAGNOSIS — I10 Essential (primary) hypertension: Secondary | ICD-10-CM | POA: Diagnosis not present

## 2019-03-04 DIAGNOSIS — G8929 Other chronic pain: Secondary | ICD-10-CM | POA: Diagnosis not present

## 2019-03-04 DIAGNOSIS — J449 Chronic obstructive pulmonary disease, unspecified: Secondary | ICD-10-CM | POA: Diagnosis not present

## 2019-03-15 DIAGNOSIS — G8929 Other chronic pain: Secondary | ICD-10-CM | POA: Diagnosis not present

## 2019-03-15 DIAGNOSIS — I10 Essential (primary) hypertension: Secondary | ICD-10-CM | POA: Diagnosis not present

## 2019-03-15 DIAGNOSIS — E43 Unspecified severe protein-calorie malnutrition: Secondary | ICD-10-CM | POA: Diagnosis not present

## 2019-03-15 DIAGNOSIS — G4700 Insomnia, unspecified: Secondary | ICD-10-CM | POA: Diagnosis not present

## 2019-03-20 DIAGNOSIS — Z5181 Encounter for therapeutic drug level monitoring: Secondary | ICD-10-CM | POA: Diagnosis not present

## 2019-03-30 DIAGNOSIS — J449 Chronic obstructive pulmonary disease, unspecified: Secondary | ICD-10-CM | POA: Diagnosis not present

## 2019-03-30 DIAGNOSIS — G8929 Other chronic pain: Secondary | ICD-10-CM | POA: Diagnosis not present

## 2019-03-30 DIAGNOSIS — E43 Unspecified severe protein-calorie malnutrition: Secondary | ICD-10-CM | POA: Diagnosis not present

## 2019-03-30 DIAGNOSIS — G309 Alzheimer's disease, unspecified: Secondary | ICD-10-CM | POA: Diagnosis not present

## 2019-04-14 DIAGNOSIS — G8929 Other chronic pain: Secondary | ICD-10-CM | POA: Diagnosis not present

## 2019-04-14 DIAGNOSIS — G309 Alzheimer's disease, unspecified: Secondary | ICD-10-CM | POA: Diagnosis not present

## 2019-04-14 DIAGNOSIS — E43 Unspecified severe protein-calorie malnutrition: Secondary | ICD-10-CM | POA: Diagnosis not present

## 2019-04-14 DIAGNOSIS — J449 Chronic obstructive pulmonary disease, unspecified: Secondary | ICD-10-CM | POA: Diagnosis not present

## 2019-04-27 DIAGNOSIS — R0989 Other specified symptoms and signs involving the circulatory and respiratory systems: Secondary | ICD-10-CM | POA: Diagnosis not present

## 2019-04-27 DIAGNOSIS — E43 Unspecified severe protein-calorie malnutrition: Secondary | ICD-10-CM | POA: Diagnosis not present

## 2019-04-27 DIAGNOSIS — R05 Cough: Secondary | ICD-10-CM | POA: Diagnosis not present

## 2019-04-27 DIAGNOSIS — G8929 Other chronic pain: Secondary | ICD-10-CM | POA: Diagnosis not present

## 2019-04-28 DIAGNOSIS — R0989 Other specified symptoms and signs involving the circulatory and respiratory systems: Secondary | ICD-10-CM | POA: Diagnosis not present

## 2019-05-10 DIAGNOSIS — E43 Unspecified severe protein-calorie malnutrition: Secondary | ICD-10-CM | POA: Diagnosis not present

## 2019-05-10 DIAGNOSIS — R05 Cough: Secondary | ICD-10-CM | POA: Diagnosis not present

## 2019-05-10 DIAGNOSIS — R0989 Other specified symptoms and signs involving the circulatory and respiratory systems: Secondary | ICD-10-CM | POA: Diagnosis not present

## 2019-05-10 DIAGNOSIS — G8929 Other chronic pain: Secondary | ICD-10-CM | POA: Diagnosis not present

## 2019-05-19 DIAGNOSIS — D649 Anemia, unspecified: Secondary | ICD-10-CM | POA: Diagnosis not present

## 2019-05-19 DIAGNOSIS — Z7901 Long term (current) use of anticoagulants: Secondary | ICD-10-CM | POA: Diagnosis not present

## 2019-05-19 DIAGNOSIS — I10 Essential (primary) hypertension: Secondary | ICD-10-CM | POA: Diagnosis not present

## 2019-05-22 ENCOUNTER — Other Ambulatory Visit: Payer: Self-pay

## 2019-05-22 ENCOUNTER — Encounter (HOSPITAL_COMMUNITY): Payer: Self-pay | Admitting: *Deleted

## 2019-05-22 ENCOUNTER — Inpatient Hospital Stay (HOSPITAL_COMMUNITY)
Admission: EM | Admit: 2019-05-22 | Discharge: 2019-06-03 | DRG: 378 | Disposition: A | Discharge status: Skilled Nursing Facility | Payer: PPO | Source: Skilled Nursing Facility | Attending: Internal Medicine | Admitting: Internal Medicine

## 2019-05-22 DIAGNOSIS — M109 Gout, unspecified: Secondary | ICD-10-CM | POA: Diagnosis present

## 2019-05-22 DIAGNOSIS — Z20828 Contact with and (suspected) exposure to other viral communicable diseases: Secondary | ICD-10-CM | POA: Diagnosis not present

## 2019-05-22 DIAGNOSIS — G309 Alzheimer's disease, unspecified: Secondary | ICD-10-CM | POA: Diagnosis not present

## 2019-05-22 DIAGNOSIS — I10 Essential (primary) hypertension: Secondary | ICD-10-CM | POA: Diagnosis present

## 2019-05-22 DIAGNOSIS — M2550 Pain in unspecified joint: Secondary | ICD-10-CM | POA: Diagnosis not present

## 2019-05-22 DIAGNOSIS — L89152 Pressure ulcer of sacral region, stage 2: Secondary | ICD-10-CM | POA: Diagnosis present

## 2019-05-22 DIAGNOSIS — I825Y2 Chronic embolism and thrombosis of unspecified deep veins of left proximal lower extremity: Secondary | ICD-10-CM | POA: Diagnosis not present

## 2019-05-22 DIAGNOSIS — Z881 Allergy status to other antibiotic agents status: Secondary | ICD-10-CM

## 2019-05-22 DIAGNOSIS — I82502 Chronic embolism and thrombosis of unspecified deep veins of left lower extremity: Secondary | ICD-10-CM | POA: Diagnosis not present

## 2019-05-22 DIAGNOSIS — L89892 Pressure ulcer of other site, stage 2: Secondary | ICD-10-CM | POA: Diagnosis not present

## 2019-05-22 DIAGNOSIS — I82562 Chronic embolism and thrombosis of left calf muscular vein: Secondary | ICD-10-CM

## 2019-05-22 DIAGNOSIS — E876 Hypokalemia: Secondary | ICD-10-CM | POA: Diagnosis not present

## 2019-05-22 DIAGNOSIS — J449 Chronic obstructive pulmonary disease, unspecified: Secondary | ICD-10-CM | POA: Diagnosis present

## 2019-05-22 DIAGNOSIS — R1310 Dysphagia, unspecified: Secondary | ICD-10-CM | POA: Diagnosis present

## 2019-05-22 DIAGNOSIS — Z888 Allergy status to other drugs, medicaments and biological substances status: Secondary | ICD-10-CM

## 2019-05-22 DIAGNOSIS — R4182 Altered mental status, unspecified: Secondary | ICD-10-CM | POA: Diagnosis not present

## 2019-05-22 DIAGNOSIS — F0390 Unspecified dementia without behavioral disturbance: Secondary | ICD-10-CM | POA: Diagnosis not present

## 2019-05-22 DIAGNOSIS — K589 Irritable bowel syndrome without diarrhea: Secondary | ICD-10-CM | POA: Diagnosis present

## 2019-05-22 DIAGNOSIS — R52 Pain, unspecified: Secondary | ICD-10-CM | POA: Diagnosis not present

## 2019-05-22 DIAGNOSIS — Z66 Do not resuscitate: Secondary | ICD-10-CM | POA: Diagnosis present

## 2019-05-22 DIAGNOSIS — G4700 Insomnia, unspecified: Secondary | ICD-10-CM | POA: Diagnosis not present

## 2019-05-22 DIAGNOSIS — F329 Major depressive disorder, single episode, unspecified: Secondary | ICD-10-CM | POA: Diagnosis not present

## 2019-05-22 DIAGNOSIS — K922 Gastrointestinal hemorrhage, unspecified: Secondary | ICD-10-CM | POA: Diagnosis not present

## 2019-05-22 DIAGNOSIS — D500 Iron deficiency anemia secondary to blood loss (chronic): Secondary | ICD-10-CM | POA: Diagnosis not present

## 2019-05-22 DIAGNOSIS — Z880 Allergy status to penicillin: Secondary | ICD-10-CM

## 2019-05-22 DIAGNOSIS — T45515A Adverse effect of anticoagulants, initial encounter: Secondary | ICD-10-CM | POA: Diagnosis present

## 2019-05-22 DIAGNOSIS — Z23 Encounter for immunization: Secondary | ICD-10-CM | POA: Diagnosis not present

## 2019-05-22 DIAGNOSIS — F419 Anxiety disorder, unspecified: Secondary | ICD-10-CM | POA: Diagnosis present

## 2019-05-22 DIAGNOSIS — R791 Abnormal coagulation profile: Secondary | ICD-10-CM

## 2019-05-22 DIAGNOSIS — Z8249 Family history of ischemic heart disease and other diseases of the circulatory system: Secondary | ICD-10-CM

## 2019-05-22 DIAGNOSIS — Z90710 Acquired absence of both cervix and uterus: Secondary | ICD-10-CM

## 2019-05-22 DIAGNOSIS — Z79899 Other long term (current) drug therapy: Secondary | ICD-10-CM

## 2019-05-22 DIAGNOSIS — R5381 Other malaise: Secondary | ICD-10-CM | POA: Diagnosis not present

## 2019-05-22 DIAGNOSIS — K921 Melena: Secondary | ICD-10-CM | POA: Diagnosis not present

## 2019-05-22 DIAGNOSIS — A310 Pulmonary mycobacterial infection: Secondary | ICD-10-CM | POA: Diagnosis not present

## 2019-05-22 DIAGNOSIS — F0280 Dementia in other diseases classified elsewhere without behavioral disturbance: Secondary | ICD-10-CM | POA: Diagnosis not present

## 2019-05-22 DIAGNOSIS — R404 Transient alteration of awareness: Secondary | ICD-10-CM | POA: Diagnosis not present

## 2019-05-22 DIAGNOSIS — D638 Anemia in other chronic diseases classified elsewhere: Secondary | ICD-10-CM | POA: Diagnosis present

## 2019-05-22 DIAGNOSIS — Z03818 Encounter for observation for suspected exposure to other biological agents ruled out: Secondary | ICD-10-CM | POA: Diagnosis not present

## 2019-05-22 DIAGNOSIS — L8990 Pressure ulcer of unspecified site, unspecified stage: Secondary | ICD-10-CM | POA: Diagnosis present

## 2019-05-22 DIAGNOSIS — Z885 Allergy status to narcotic agent status: Secondary | ICD-10-CM

## 2019-05-22 DIAGNOSIS — Z9049 Acquired absence of other specified parts of digestive tract: Secondary | ICD-10-CM

## 2019-05-22 DIAGNOSIS — Z882 Allergy status to sulfonamides status: Secondary | ICD-10-CM

## 2019-05-22 DIAGNOSIS — F32A Depression, unspecified: Secondary | ICD-10-CM | POA: Diagnosis present

## 2019-05-22 DIAGNOSIS — F03C0 Unspecified dementia, severe, without behavioral disturbance, psychotic disturbance, mood disturbance, and anxiety: Secondary | ICD-10-CM

## 2019-05-22 DIAGNOSIS — D539 Nutritional anemia, unspecified: Secondary | ICD-10-CM | POA: Diagnosis not present

## 2019-05-22 DIAGNOSIS — Z801 Family history of malignant neoplasm of trachea, bronchus and lung: Secondary | ICD-10-CM

## 2019-05-22 DIAGNOSIS — Z7401 Bed confinement status: Secondary | ICD-10-CM | POA: Diagnosis not present

## 2019-05-22 DIAGNOSIS — Z86718 Personal history of other venous thrombosis and embolism: Secondary | ICD-10-CM | POA: Diagnosis not present

## 2019-05-22 DIAGNOSIS — Z7901 Long term (current) use of anticoagulants: Secondary | ICD-10-CM

## 2019-05-22 LAB — IRON AND TIBC
Iron: 44 ug/dL (ref 28–170)
Saturation Ratios: 15 % (ref 10.4–31.8)
TIBC: 297 ug/dL (ref 250–450)
UIBC: 253 ug/dL

## 2019-05-22 LAB — RETICULOCYTES
Immature Retic Fract: 13.7 % (ref 2.3–15.9)
RBC.: 3.23 MIL/uL — ABNORMAL LOW (ref 3.87–5.11)
Retic Count, Absolute: 67.2 10*3/uL (ref 19.0–186.0)
Retic Ct Pct: 2.1 % (ref 0.4–3.1)

## 2019-05-22 LAB — PROTIME-INR
INR: 4.1 (ref 0.8–1.2)
Prothrombin Time: 39.1 seconds — ABNORMAL HIGH (ref 11.4–15.2)

## 2019-05-22 LAB — TYPE AND SCREEN
ABO/RH(D): B POS
Antibody Screen: NEGATIVE

## 2019-05-22 LAB — CBC WITH DIFFERENTIAL/PLATELET
Abs Immature Granulocytes: 0.01 10*3/uL (ref 0.00–0.07)
Basophils Absolute: 0 10*3/uL (ref 0.0–0.1)
Basophils Relative: 0 %
Eosinophils Absolute: 0.2 10*3/uL (ref 0.0–0.5)
Eosinophils Relative: 4 %
HCT: 34.6 % — ABNORMAL LOW (ref 36.0–46.0)
Hemoglobin: 10.5 g/dL — ABNORMAL LOW (ref 12.0–15.0)
Immature Granulocytes: 0 %
Lymphocytes Relative: 17 %
Lymphs Abs: 1 10*3/uL (ref 0.7–4.0)
MCH: 32 pg (ref 26.0–34.0)
MCHC: 30.3 g/dL (ref 30.0–36.0)
MCV: 105.5 fL — ABNORMAL HIGH (ref 80.0–100.0)
Monocytes Absolute: 0.5 10*3/uL (ref 0.1–1.0)
Monocytes Relative: 9 %
Neutro Abs: 4 10*3/uL (ref 1.7–7.7)
Neutrophils Relative %: 70 %
Platelets: 186 10*3/uL (ref 150–400)
RBC: 3.28 MIL/uL — ABNORMAL LOW (ref 3.87–5.11)
RDW: 12.9 % (ref 11.5–15.5)
WBC: 5.7 10*3/uL (ref 4.0–10.5)
nRBC: 0 % (ref 0.0–0.2)

## 2019-05-22 LAB — COMPREHENSIVE METABOLIC PANEL
ALT: 16 U/L (ref 0–44)
AST: 17 U/L (ref 15–41)
Albumin: 3.2 g/dL — ABNORMAL LOW (ref 3.5–5.0)
Alkaline Phosphatase: 51 U/L (ref 38–126)
Anion gap: 6 (ref 5–15)
BUN: 29 mg/dL — ABNORMAL HIGH (ref 8–23)
CO2: 31 mmol/L (ref 22–32)
Calcium: 8.3 mg/dL — ABNORMAL LOW (ref 8.9–10.3)
Chloride: 107 mmol/L (ref 98–111)
Creatinine, Ser: 0.75 mg/dL (ref 0.44–1.00)
GFR calc Af Amer: >60 mL/min (ref >60)
GFR calc non Af Amer: >60 mL/min (ref >60)
Glucose, Bld: 96 mg/dL (ref 70–99)
Potassium: 4.4 mmol/L (ref 3.5–5.1)
Sodium: 144 mmol/L (ref 135–145)
Total Bilirubin: 0.3 mg/dL (ref 0.3–1.2)
Total Protein: 6.8 g/dL (ref 6.5–8.1)

## 2019-05-22 LAB — MRSA PCR SCREENING: MRSA by PCR: NEGATIVE

## 2019-05-22 LAB — VITAMIN B12: Vitamin B-12: 608 pg/mL (ref 180–914)

## 2019-05-22 LAB — POC OCCULT BLOOD, ED: Fecal Occult Bld: POSITIVE — AB

## 2019-05-22 LAB — APTT: aPTT: 50 seconds — ABNORMAL HIGH (ref 24–36)

## 2019-05-22 LAB — FERRITIN: Ferritin: 18 ng/mL (ref 11–307)

## 2019-05-22 LAB — FOLATE: Folate: 16.7 ng/mL (ref >5.9)

## 2019-05-22 MED ORDER — RIFAMPIN 300 MG PO CAPS
600.0000 mg | ORAL_CAPSULE | ORAL | Status: DC
  Administered 2019-05-23 – 2019-06-03 (×6): 600 mg via ORAL
  Filled 2019-05-22 (×6): qty 2

## 2019-05-22 MED ORDER — IPRATROPIUM-ALBUTEROL 0.5-2.5 (3) MG/3ML IN SOLN: 3.0000 mL | Freq: Four times a day (QID) | RESPIRATORY_TRACT | Status: DC | PRN

## 2019-05-22 MED ORDER — ONDANSETRON HCL 4 MG PO TABS: 4.0000 mg | ORAL_TABLET | Freq: Four times a day (QID) | ORAL | Status: DC | PRN

## 2019-05-22 MED ORDER — SODIUM CHLORIDE 0.45 % IV SOLN
INTRAVENOUS | Status: DC
  Administered 2019-05-22 – 2019-06-01 (×8): via INTRAVENOUS

## 2019-05-22 MED ORDER — ETHAMBUTOL HCL 400 MG PO TABS
1600.0000 mg | ORAL_TABLET | ORAL | Status: DC
  Administered 2019-05-23 – 2019-06-03 (×6): 1600 mg via ORAL
  Filled 2019-05-22 (×6): qty 4

## 2019-05-22 MED ORDER — DIVALPROEX SODIUM 125 MG PO CSDR
375.0000 mg | DELAYED_RELEASE_CAPSULE | Freq: Three times a day (TID) | ORAL | Status: DC
  Administered 2019-05-22 – 2019-06-03 (×33): 375 mg via ORAL
  Filled 2019-05-22 (×37): qty 3

## 2019-05-22 MED ORDER — ACETAMINOPHEN 650 MG RE SUPP: 650.0000 mg | Freq: Four times a day (QID) | RECTAL | Status: DC | PRN

## 2019-05-22 MED ORDER — FLEET ENEMA 7-19 GM/118ML RE ENEM: 1.0000 | ENEMA | Freq: Once | RECTAL | Status: DC | PRN

## 2019-05-22 MED ORDER — SENNOSIDES-DOCUSATE SODIUM 8.6-50 MG PO TABS: 1.0000 | ORAL_TABLET | Freq: Every evening | ORAL | Status: DC | PRN

## 2019-05-22 MED ORDER — ONDANSETRON HCL 4 MG/2ML IJ SOLN: 4.0000 mg | Freq: Four times a day (QID) | INTRAMUSCULAR | Status: DC | PRN

## 2019-05-22 MED ORDER — ALBUTEROL SULFATE (2.5 MG/3ML) 0.083% IN NEBU: 2.5000 mg | INHALATION_SOLUTION | RESPIRATORY_TRACT | Status: DC | PRN

## 2019-05-22 MED ORDER — ACETAMINOPHEN 325 MG PO TABS: 650.0000 mg | ORAL_TABLET | Freq: Four times a day (QID) | ORAL | Status: DC | PRN

## 2019-05-22 MED ORDER — ALPRAZOLAM 1 MG PO TABS
1.0000 mg | ORAL_TABLET | Freq: Every day | ORAL | Status: DC
  Administered 2019-05-22 – 2019-05-24 (×3): 1 mg via ORAL
  Filled 2019-05-22 (×3): qty 1

## 2019-05-22 MED ORDER — DOCUSATE SODIUM 100 MG PO CAPS
100.0000 mg | ORAL_CAPSULE | Freq: Two times a day (BID) | ORAL | Status: DC
  Administered 2019-05-24 – 2019-06-03 (×11): 100 mg via ORAL
  Filled 2019-05-22 (×22): qty 1

## 2019-05-22 MED ORDER — MIRTAZAPINE 7.5 MG PO TABS
3.7500 mg | ORAL_TABLET | Freq: Every day | ORAL | Status: DC
  Administered 2019-05-22 – 2019-06-02 (×10): 3.75 mg via ORAL
  Filled 2019-05-22 (×13): qty 1

## 2019-05-22 MED ORDER — OLANZAPINE 5 MG PO TBDP
7.5000 mg | ORAL_TABLET | Freq: Every day | ORAL | Status: DC
  Administered 2019-05-22 – 2019-06-02 (×10): 7.5 mg via ORAL
  Filled 2019-05-22 (×13): qty 1.5

## 2019-05-22 MED ORDER — LORATADINE 10 MG PO TABS
10.0000 mg | ORAL_TABLET | Freq: Every day | ORAL | Status: DC
  Administered 2019-05-23 – 2019-06-03 (×12): 10 mg via ORAL
  Filled 2019-05-22 (×12): qty 1

## 2019-05-22 MED ORDER — MELATONIN 3 MG PO TABS
3.0000 mg | ORAL_TABLET | Freq: Every day | ORAL | Status: DC
  Administered 2019-05-22 – 2019-05-24 (×3): 3 mg via ORAL
  Filled 2019-05-22 (×5): qty 1

## 2019-05-22 MED ORDER — PAROXETINE HCL 10 MG PO TABS
10.0000 mg | ORAL_TABLET | Freq: Every day | ORAL | Status: DC
  Administered 2019-05-23 – 2019-06-03 (×12): 10 mg via ORAL
  Filled 2019-05-22 (×12): qty 1

## 2019-05-22 MED ORDER — ALPRAZOLAM 0.5 MG PO TABS
0.5000 mg | ORAL_TABLET | Freq: Two times a day (BID) | ORAL | Status: DC
  Administered 2019-05-23 – 2019-06-03 (×19): 0.5 mg via ORAL
  Filled 2019-05-22 (×19): qty 1

## 2019-05-22 MED ORDER — BISACODYL 10 MG RE SUPP: 10.0000 mg | Freq: Every day | RECTAL | Status: DC | PRN

## 2019-05-22 MED ORDER — PANTOPRAZOLE SODIUM 40 MG IV SOLR
40.0000 mg | Freq: Two times a day (BID) | INTRAVENOUS | Status: DC
  Administered 2019-05-22 – 2019-06-03 (×24): 40 mg via INTRAVENOUS
  Filled 2019-05-22 (×24): qty 40

## 2019-05-22 NOTE — ED Notes (Signed)
CRITICAL VALUE STICKER  CRITICAL VALUE: INR 4.1  RECEIVER (on-site recipient of call): Epifanio Lesches  DATE & TIME NOTIFIED: 05/22/2019 1523  MESSENGER (representative from lab): Raynelle Fanning  MD NOTIFIED: Tomi Bamberger  TIME OF NOTIFICATION: 1524  RESPONSE: Awaiting orders

## 2019-05-22 NOTE — H&P (Signed)
History and Physical    Stacie Wright LOV:[id number] DOB: Dec 13, 1927 DOA: 05/22/2019  PCP: Benita Stabile, MD Patient coming from: SNF.  Hoyer lift at baseline.  Chief Complaint: Rectal bleed  HPI: Stacie Wright is a 85 y.o. female with history of Alzheimer's dementia, anxiety, depression, COPD, LLE DVT on warfarin, HTN not on medication, Mycobacterium avium-intracellulare infection and normocytic anemia brought to ED from SNF with the above chief complaint.  Patient has advanced dementia and not able to provide history.  Patient is awake and alert but oriented only to self.  She thinks she is in [place].  Follows some commands but does not respond to question appropriately.  Attempted to call family members including spouse and 2 sons using all the phone number's listed in the chart but no answer.   Called and talked to Fritz Pickerel, Charity fundraiser at [facility].  Per Ms. Thomas, nursing tech noticed some blood in his stool during diaper change.  Otherwise, patient has been in her usual state of health.  Ms. Maisie Fus states that she has been on TB medication since 12/20/2018.  She is also on warfarin for over a year. Per Ms. Maisie Fus, patient is DNR.  In ED, hemodynamically stable.  CMP not impressive.  Hgb 10.5 (at baseline).  MCV 105.  INR 4.1.  FOBT positive.  COVID-19 pending.  Hospitalist service called for admission for GI bleed.  I have requested EDP to consult GI as patient might need clearance to resume warfarin.  ROS Not able to perform review of system due to patient's advanced dementia.  PMH Past Medical History:  Diagnosis Date  . Alzheimer's dementia   . Anxiety   . Arthritis   . Bronchiectasis (HCC) 2014  . COPD (chronic obstructive pulmonary disease) (HCC)   . DVT (deep venous thrombosis) (HCC) 2008   LLE  . Gout   . Headache(784.0)   . Hypertension   . IBS (irritable bowel syndrome)   . MAI (mycobacterium avium-intracellulare) infection (HCC) 2014  . Mold exposure  06/20/2015  . Pneumonia 04/2013   PSH Past Surgical History:  Procedure Laterality Date  . ABDOMINAL HYSTERECTOMY    . CATARACT EXTRACTION    . CHOLECYSTECTOMY    . FRACTURE SURGERY     Fam HX Family History  Problem Relation Age of Onset  . Diabetes Father   . Hypertension Father   . Rheum arthritis Father   . Lung cancer Sister        smoked    Social Hx  reports that she has never smoked. She has never used smokeless tobacco. She reports that she does not drink alcohol or use drugs.  Allergy Allergies  Allergen Reactions  . Amoxicillin Anaphylaxis and Rash  . Darvocet [Propoxyphene N-Acetaminophen] Other (See Comments)    hallucinations  . Penicillins Anaphylaxis and Rash    Has patient had a PCN reaction causing immediate rash, facial/tongue/throat swelling, SOB or lightheadedness with hypotension: Yes Has patient had a PCN reaction causing severe rash involving mucus membranes or skin necrosis: No Has patient had a PCN reaction that required hospitalization No Has patient had a PCN reaction occurring within the last 10 years: No If all of the above answers are "NO", then may proceed with Cephalosporin use.   . Sulfa Antibiotics Anaphylaxis and Rash  . Propoxyphene Other (See Comments)    hallucinations   Home Meds Prior to Admission medications   Medication Sig Start Date End Date Taking? Authorizing Provider  acetaminophen (  TYLENOL) 500 MG tablet Take 1 tablet (500 mg total) by mouth every 6 (six) hours as needed for mild pain, fever or headache. 12/03/16  Yes Vassie LollMadera, Carlos, MD  ALPRAZolam Prudy Feeler(XANAX) 0.5 MG tablet Take 1-2 tablets (0.5-1 mg total) by mouth every 6 (six) hours as needed for anxiety. Patient taking differently: Take 0.5 mg by mouth 2 (two) times daily.  12/03/16  Yes Vassie LollMadera, Carlos, MD  ALPRAZolam Prudy Feeler(XANAX) 1 MG tablet Take 1 mg by mouth at bedtime.   Yes [provider]  chlorhexidine (PERIDEX) 0.12 % solution Use as directed 15 mLs in the mouth  or throat at bedtime.   Yes [provider]  dextromethorphan (DELSYM COUGH CHILDRENS) 30 MG/5ML liquid Take 10 mLs by mouth 2 (two) times daily.   Yes [provider]  diclofenac sodium (VOLTAREN) 1 % GEL Apply 2 g topically 4 (four) times daily.   Yes [provider]  divalproex (DEPAKOTE SPRINKLE) 125 MG capsule Take 2 capsules (250 mg total) by mouth 3 (three) times daily. Patient taking differently: Take 375 mg by mouth 3 (three) times daily.  12/03/16  Yes Vassie LollMadera, Carlos, MD  ethambutol (MYAMBUTOL) 400 MG tablet Take 1,600 mg by mouth 3 (three) times a week. Monday, Wednesday, & Friday   Yes [provider]  ipratropium-albuterol (DUONEB) 0.5-2.5 (3) MG/3ML SOLN Take 3 mLs by nebulization every 6 (six) hours as needed (SOB).   Yes [provider]  loratadine (CLARITIN) 10 MG tablet Take 10 mg by mouth daily.   Yes [provider]  Melatonin 3 MG TABS Take 3 mg by mouth at bedtime.   Yes [provider]  Menthol-Zinc Oxide 0.4-20.5 % PSTE Apply 1 application topically 2 (two) times daily.   Yes [provider]  mirtazapine (REMERON) 7.5 MG tablet Take 3.75 mg by mouth at bedtime.   Yes [provider]  OLANZapine zydis (ZYPREXA) 5 MG disintegrating tablet Take 1 tablet (5 mg total) by mouth 2 (two) times daily. Patient taking differently: Take 7.5 mg by mouth at bedtime.  12/03/16  Yes Vassie LollMadera, Carlos, MD  PARoxetine (PAXIL) 20 MG tablet Take 10 mg by mouth daily.    Yes [provider]  rifampin (RIFADIN) 300 MG capsule Take 600 mg by mouth 3 (three) times a week. Monday, Wednesday, & Friday   Yes [provider]  sodium fluoride (PREVIDENT 5000 PLUS) 1.1 % CREA dental cream Place 1 application onto teeth every evening.   Yes [provider]  warfarin (COUMADIN) 7.5 MG tablet Take 7.5 mg by mouth every evening.   Yes [provider]  zinc sulfate (ZINC-220) 220 (50 Zn) MG capsule  Take 220 mg by mouth daily.   Yes [provider]    Physical Exam: Vitals:   05/22/19 1500 05/22/19 1515 05/22/19 1530 05/22/19 1545  BP: (!) 144/82  (!) 154/75   Pulse: 84 85 83 88  Resp:   17   Temp:      TempSrc:      SpO2: 100% 100% 100% 100%    GENERAL: No acute distress.  Elderly female. HEENT: MMM.  Vision and hearing grossly intact.  NECK: Supple.  No apparent JVD.  RESP:  No IWOB. Good air movement bilaterally. CVS:  RRR. Heart sounds normal.  ABD/GI/GU: Bowel sounds present. Soft. Non tender.  MSK/EXT:  Moves extremities. No apparent deformity or edema.  SKIN: stage II skin pressure injury over backside per EDP. NEURO: Awake, alert but oriented only to  self.  Cranial nerves and motor grossly intact. PSYCH: Calm.  Personally Reviewed Radiological Exams No results found.   Personally Reviewed Labs: CBC: Recent Labs  Lab 05/22/19 1429  WBC 5.7  NEUTROABS 4.0  HGB 10.5*  HCT 34.6*  MCV 105.5*  PLT 935   Basic Metabolic Panel: Recent Labs  Lab 05/22/19 1429  NA 144  K 4.4  CL 107  CO2 31  GLUCOSE 96  BUN 29*  CREATININE 0.75  CALCIUM 8.3*   GFR: CrCl cannot be calculated (Unknown ideal weight.). Liver Function Tests: Recent Labs  Lab 05/22/19 1429  AST 17  ALT 16  ALKPHOS 51  BILITOT 0.3  PROT 6.8  ALBUMIN 3.2*   No results for input(s): LIPASE, AMYLASE in the last 168 hours. No results for input(s): AMMONIA in the last 168 hours. Coagulation Profile: Recent Labs  Lab 05/22/19 1429  INR 4.1*   Cardiac Enzymes: No results for input(s): CKTOTAL, CKMB, CKMBINDEX, TROPONINI in the last 168 hours. BNP (last 3 results) No results for input(s): PROBNP in the last 8760 hours. HbA1C: No results for input(s): HGBA1C in the last 72 hours. CBG: No results for input(s): GLUCAP in the last 168 hours. Lipid Profile: No results for input(s): CHOL, HDL, LDLCALC, TRIG, CHOLHDL, LDLDIRECT in the last 72 hours. Thyroid Function Tests:  No results for input(s): TSH, T4TOTAL, FREET4, T3FREE, THYROIDAB in the last 72 hours. Anemia Panel: No results for input(s): VITAMINB12, FOLATE, FERRITIN, TIBC, IRON, RETICCTPCT in the last 72 hours. Urine analysis:    Component Value Date/Time   COLORURINE AMBER (A) 11/27/2016 1700   APPEARANCEUR HAZY (A) 11/27/2016 1700   LABSPEC 1.028 11/27/2016 1700   PHURINE 5.0 11/27/2016 1700   GLUCOSEU NEGATIVE 11/27/2016 1700   HGBUR NEGATIVE 11/27/2016 1700   BILIRUBINUR NEGATIVE 11/27/2016 1700   KETONESUR 20 (A) 11/27/2016 1700   PROTEINUR 30 (A) 11/27/2016 1700   UROBILINOGEN 0.2 04/25/2013 0843   NITRITE NEGATIVE 11/27/2016 1700   LEUKOCYTESUR LARGE (A) 11/27/2016 1700    Sepsis Labs:  None  Personally Reviewed EKG:  Not obtained.  Assessment/Plan Rectal bleed: FOBT positive.  Hgb at baseline.  Patient is on warfarin for DVT.  INR supratherapeutic. -Type and screen. -Per EDP, Dr. Tarri Glenn from GI consulted and will see patient in a.m. -Secured to peripheral IV lines -Repeat CBC in a.m. -IV Protonix twice daily -Clear liquid diet -Hold warfarin.  SCD for VTE prophylaxis  Macrocytic anemia: Hgb at baseline. -Anemia panel   Alzheimer's dementia: Advanced.  Only oriented to self. -Delirium precautions  Anxiety/depression/insomnia:  -Continue home Xanax, Depakote, Zyprexa, Paxil, Remeron and melatonin -Daily Xanax might not be a good option.  Consider changing to Klonopin-less withdrawal symptoms  Chronic COPD: No respiratory issue. -PRN DuoNeb  LLE DVT on warfarin for over a year.  Supratherapeutic INR -Hold warfarin -Recheck PT/INR in the morning  HTN not on medication -PRN hydralazine  Mycobacterium avium-intracellulare infectio -Continue home ethambutol and rifampin-reportedly on this since 12/20/2018 -Will curbside ID in the morning -Does not need isolation precaution  DVT prophylaxis: SCD Code Status: DNR Family Communication: Attempted to call husband and  2 sons but no answer.  Disposition Plan: Admit to telemetry bed Consults called: GI by EDP Admission status: DNR   Mercy Riding MD [facility]  If 7PM-7AM, please contact night-coverage [email or web address] Password [facility]  05/22/2019, 4:58 PM

## 2019-05-22 NOTE — ED Provider Notes (Signed)
[facility]-EMERGENCY DEPT Provider Note   CSN: [id number] Arrival date & time: 05/22/19  1253   Level 5 caveat: Dementia  History   Chief Complaint Chief Complaint  Patient presents with  . Melena    HPI Stacie Wright is a 85 y.o. female.     HPI Patient presents to the emergency room for evaluation of rectal bleeding.  Patient has a history of Alzheimer's dementia and is a resident of a skilled nursing facility.  History is limited because of her dementia.  Patient herself denies any complaints.  She is not aware that she is at the hospital.  According to the EMS report staff at the nursing facility noted some blood in her diaper this morning.  They noticed black and red discoloration in her stool.  According to nursing staff the patient had complained of abdominal pain.  She is on Coumadin.  Patient denied any abdominal pain for me.  She is not aware that she has had rectal bleeding more that she was even sent to the emergency room. Past Medical History:  Diagnosis Date  . Alzheimer's dementia   . Anxiety   . Arthritis   . Bronchiectasis (HCC) 2014  . COPD (chronic obstructive pulmonary disease) (HCC)   . DVT (deep venous thrombosis) (HCC) 2008   LLE  . Gout   . Headache(784.0)   . Hypertension   . IBS (irritable bowel syndrome)   . MAI (mycobacterium avium-intracellulare) infection (HCC) 2014  . Mold exposure 06/20/2015  . Pneumonia 04/2013    Patient Active Problem List   Diagnosis Date Noted  . Severe protein-calorie malnutrition (HCC)   . Acute encephalopathy 11/28/2016  . Pressure injury of skin 11/28/2016  . Coumadin toxicity, accidental or unintentional, initial encounter   . Acute lower UTI 11/27/2016  . Agitation 11/27/2016  . Hypokalemia 11/27/2016  . Macrocytic anemia 11/27/2016  . Supratherapeutic INR 11/27/2016  . Hx of gout 11/20/2016  . Recurrent deep vein thrombosis (DVT) (HCC) 11/18/2016  . HCAP (healthcare-associated  pneumonia)   . Respiratory failure (HCC) 11/10/2016  . Pneumonia of right lung due to infectious organism   . Lactic acidosis   . Sepsis (HCC)   . Obstructive bronchiectasis (HCC) 11/01/2015  . Bronchiectasis (HCC) 10/11/2015  . Acute respiratory failure with hypoxia (HCC) 10/11/2015  . Acute exacerbation of bronchiectasis (HCC) 10/11/2015  . Mold exposure 06/20/2015  . Generalized anxiety disorder 01/19/2015  . Depression 01/19/2015  . Dementia in Alzheimer's disease (HCC) 01/19/2015  . Mycobacterium avium-intracellulare infection (HCC) 01/18/2015  . Influenza with pneumonia 01/15/2015  . Cellulitis 06/09/2013  . Infected hardware in left leg (HCC) 06/09/2013  . Constipation 04/27/2013  . Pneumonia, organism unspecified(486) 04/26/2013  . COPD exacerbation (HCC) 04/26/2013  . Bronchiectasis without acute exacerbation (HCC) 04/26/2013  . Pulmonary nodule 04/26/2013  . Acute bronchitis 04/25/2013  . Generalized weakness 04/25/2013  . Chronic respiratory failure with hypoxia (HCC) 04/25/2013  . Hypertension     Past Surgical History:  Procedure Laterality Date  . ABDOMINAL HYSTERECTOMY    . CATARACT EXTRACTION    . CHOLECYSTECTOMY    . FRACTURE SURGERY       OB History   No obstetric history on file.      Home Medications    Prior to Admission medications   Medication Sig Start Date End Date Taking? Authorizing Provider  acetaminophen (TYLENOL) 500 MG tablet Take 1 tablet (500 mg total) by mouth every 6 (six) hours as needed for mild  pain, fever or headache. 12/03/16  Yes Barton Dubois, MD  ALPRAZolam Duanne Moron) 0.5 MG tablet Take 1-2 tablets (0.5-1 mg total) by mouth every 6 (six) hours as needed for anxiety. Patient taking differently: Take 0.5 mg by mouth 2 (two) times daily.  12/03/16  Yes Barton Dubois, MD  ALPRAZolam Duanne Moron) 1 MG tablet Take 1 mg by mouth at bedtime.   Yes [provider]  chlorhexidine (PERIDEX) 0.12 % solution Use as directed 15 mLs in  the mouth or throat at bedtime.   Yes [provider]  dextromethorphan (DELSYM COUGH CHILDRENS) 30 MG/5ML liquid Take 10 mLs by mouth 2 (two) times daily.   Yes [provider]  diclofenac sodium (VOLTAREN) 1 % GEL Apply 2 g topically 4 (four) times daily.   Yes [provider]  divalproex (DEPAKOTE SPRINKLE) 125 MG capsule Take 2 capsules (250 mg total) by mouth 3 (three) times daily. Patient taking differently: Take 375 mg by mouth 3 (three) times daily.  12/03/16  Yes Barton Dubois, MD  ethambutol (MYAMBUTOL) 400 MG tablet Take 1,600 mg by mouth 3 (three) times a week. Monday, Wednesday, & Friday   Yes [provider]  ipratropium-albuterol (DUONEB) 0.5-2.5 (3) MG/3ML SOLN Take 3 mLs by nebulization every 6 (six) hours as needed (SOB).   Yes [provider]  loratadine (CLARITIN) 10 MG tablet Take 10 mg by mouth daily.   Yes [provider]  Melatonin 3 MG TABS Take 3 mg by mouth at bedtime.   Yes [provider]  Menthol-Zinc Oxide 0.4-20.5 % PSTE Apply 1 application topically 2 (two) times daily.   Yes [provider]  mirtazapine (REMERON) 7.5 MG tablet Take 3.75 mg by mouth at bedtime.   Yes [provider]  OLANZapine zydis (ZYPREXA) 5 MG disintegrating tablet Take 1 tablet (5 mg total) by mouth 2 (two) times daily. Patient taking differently: Take 7.5 mg by mouth at bedtime.  12/03/16  Yes Barton Dubois, MD  PARoxetine (PAXIL) 20 MG tablet Take 10 mg by mouth daily.    Yes [provider]  rifampin (RIFADIN) 300 MG capsule Take 600 mg by mouth 3 (three) times a week. Monday, Wednesday, & Friday   Yes [provider]  sodium fluoride (PREVIDENT 5000 PLUS) 1.1 % CREA dental cream Place 1 application onto teeth every evening.   Yes [provider]  warfarin (COUMADIN) 7.5 MG tablet Take 7.5 mg by mouth every evening.   Yes [provider]  zinc sulfate (ZINC-220) 220 (50 Zn) MG  capsule Take 220 mg by mouth daily.   Yes [provider]  dextromethorphan-guaiFENesin (MUCINEX DM) 30-600 MG 12hr tablet Take 1 tablet by mouth 2 (two) times daily. Patient not taking: Reported on 05/22/2019 12/03/16   Barton Dubois, MD  spironolactone (ALDACTONE) 25 MG tablet Take 0.5 tablets (12.5 mg total) by mouth daily. Patient not taking: Reported on 05/22/2019 12/03/16   Barton Dubois, MD  warfarin (COUMADIN) 2 MG tablet Take 1 tablet (2 mg total) by mouth one time only at 6 PM. Patient not taking: Reported on 05/22/2019 12/03/16   Barton Dubois, MD    Family History Family History  Problem Relation Age of Onset  . Diabetes Father   . Hypertension Father   . Rheum arthritis Father   . Lung cancer Sister        smoked    Social History Social History   Tobacco Use  . Smoking status: Never Smoker  . Smokeless  tobacco: Never Used  Substance Use Topics  . Alcohol use: No  . Drug use: No     Allergies   Amoxicillin, Darvocet [propoxyphene n-acetaminophen], Penicillins, Sulfa antibiotics, and Propoxyphene   Review of Systems Review of Systems  All other systems reviewed and are negative.    Physical Exam Updated Vital Signs BP (!) 153/87   Pulse 80   Temp 98.7 F (37.1 C) (Oral)   Resp 20   SpO2 100%   Physical Exam Vitals signs and nursing note reviewed.  Constitutional:      General: She is not in acute distress.    Appearance: She is well-developed.     Comments: Elderly, frail  HENT:     Head: Normocephalic and atraumatic.     Right Ear: External ear normal.     Left Ear: External ear normal.  Eyes:     General: No scleral icterus.       Right eye: No discharge.        Left eye: No discharge.     Conjunctiva/sclera: Conjunctivae normal.  Neck:     Musculoskeletal: Neck supple.     Trachea: No tracheal deviation.  Cardiovascular:     Rate and Rhythm: Normal rate and regular rhythm.  Pulmonary:     Effort: Pulmonary effort is normal. No  respiratory distress.     Breath sounds: Normal breath sounds. No stridor. No wheezing or rales.  Abdominal:     General: Bowel sounds are normal. There is no distension.     Palpations: Abdomen is soft.     Tenderness: There is no abdominal tenderness. There is no guarding or rebound.  Genitourinary:    Comments: Small amount of skin breakdown in the buttocks, rectal exam with some mucoid bloody stool noted Musculoskeletal:        General: No tenderness.  Skin:    General: Skin is warm and dry.     Findings: No rash.  Neurological:     Mental Status: She is alert.     GCS: GCS eye subscore is 4. GCS verbal subscore is 4. GCS motor subscore is 6.     Cranial Nerves: No dysarthria. Cranial nerve deficit: no facial droop,  , no slurred speech       Sensory: No sensory deficit.     Motor: No abnormal muscle tone or seizure activity.     Comments: Patient intermittently follows commands, she is alert and awake and talkative, she is confused and at times seems to be talking to people that are not in the room  Psychiatric:        Attention and Perception: She perceives visual hallucinations.      ED Treatments / Results  Labs (all labs ordered are listed, but only abnormal results are displayed) Labs Reviewed  COMPREHENSIVE METABOLIC PANEL - Abnormal; Notable for the following components:      Result Value   BUN 29 (*)    Calcium 8.3 (*)    Albumin 3.2 (*)    All other components within normal limits  CBC WITH DIFFERENTIAL/PLATELET - Abnormal; Notable for the following components:   RBC 3.28 (*)    Hemoglobin 10.5 (*)    HCT 34.6 (*)    MCV 105.5 (*)    All other components within normal limits  APTT - Abnormal; Notable for the following components:   aPTT 50 (*)    All other components within normal limits  PROTIME-INR - Abnormal; Notable for the following components:  Prothrombin Time 39.1 (*)    INR 4.1 (*)    All other components within normal limits  POC OCCULT BLOOD,  ED - Abnormal; Notable for the following components:   Fecal Occult Bld POSITIVE (*)    All other components within normal limits  TYPE AND SCREEN    EKG None  Radiology No results found.  Procedures Procedures (including critical care time)  Medications Ordered in ED Medications - No data to display   Initial Impression / Assessment and Plan / ED Course  I have reviewed the triage vital signs and the nursing notes.  Pertinent labs & imaging results that were available during my care of the patient were reviewed by me and considered in my medical decision making (see chart for details).  Clinical Course as of May 21 1525  Wynelle LinkSun May 22, 2019  1504 Hemoglobin is stable.  Stool is positive for blood   [JK]  1524 Hemoglobin is stable.  Electrolyte panel is unremarkable.  INR was reported to be 4.1, supratherapeutic   [JK]    Clinical Course User Index [JK] Linwood DibblesKnapp, Cayla Wiegand, MD     Patient presents to the ED from a nursing facility for rectal bleeding.  Patient is hemodynamically stable in the ED.  Hemoglobin is unchanged from her baseline.  Patient's INR however is supratherapeutic and she does have gross blood in her stool.  She is not having a large amount of bleeding at this point so I think we can just hold her Coumadin dose.  I do think she warrants admission for observation and serial laboratory testing.  I will consult the medical service for admission.   Final Clinical Impressions(s) / ED Diagnoses   Final diagnoses:  Gastrointestinal hemorrhage, unspecified gastrointestinal hemorrhage type  Supratherapeutic INR      Linwood DibblesKnapp, Rhodia Acres, MD 05/22/19 1526

## 2019-05-22 NOTE — ED Triage Notes (Signed)
PT BIBA from [facility]  Per EMS- Staff noticed dark colored stools starting this AM, that "looked like berries, black/red, in her stool" Pt hx of dementia and Alzheimers.  PT c/o abdominal pain. Pt takes Coumadin. Pt arrives on 3L O2, which is her baseline.

## 2019-05-23 DIAGNOSIS — K922 Gastrointestinal hemorrhage, unspecified: Secondary | ICD-10-CM | POA: Diagnosis not present

## 2019-05-23 DIAGNOSIS — R791 Abnormal coagulation profile: Secondary | ICD-10-CM | POA: Diagnosis not present

## 2019-05-23 LAB — CBC
HCT: 37.8 % (ref 36.0–46.0)
Hemoglobin: 11.3 g/dL — ABNORMAL LOW (ref 12.0–15.0)
MCH: 32.8 pg (ref 26.0–34.0)
MCHC: 29.9 g/dL — ABNORMAL LOW (ref 30.0–36.0)
MCV: 109.6 fL — ABNORMAL HIGH (ref 80.0–100.0)
Platelets: 143 10*3/uL — ABNORMAL LOW (ref 150–400)
RBC: 3.45 MIL/uL — ABNORMAL LOW (ref 3.87–5.11)
RDW: 12.7 % (ref 11.5–15.5)
WBC: 5 10*3/uL (ref 4.0–10.5)
nRBC: 0 % (ref 0.0–0.2)

## 2019-05-23 LAB — BASIC METABOLIC PANEL
Anion gap: 12 (ref 5–15)
BUN: 21 mg/dL (ref 8–23)
CO2: 28 mmol/L (ref 22–32)
Calcium: 8.8 mg/dL — ABNORMAL LOW (ref 8.9–10.3)
Chloride: 103 mmol/L (ref 98–111)
Creatinine, Ser: 0.68 mg/dL (ref 0.44–1.00)
GFR calc Af Amer: >60 mL/min (ref >60)
GFR calc non Af Amer: >60 mL/min (ref >60)
Glucose, Bld: 78 mg/dL (ref 70–99)
Potassium: 3.6 mmol/L (ref 3.5–5.1)
Sodium: 143 mmol/L (ref 135–145)

## 2019-05-23 LAB — PROTIME-INR
INR: 3.3 — ABNORMAL HIGH (ref 0.8–1.2)
Prothrombin Time: 32.6 seconds — ABNORMAL HIGH (ref 11.4–15.2)

## 2019-05-23 LAB — SARS CORONAVIRUS 2 (TAT 6-24 HRS): SARS Coronavirus 2: NEGATIVE

## 2019-05-23 LAB — ABO/RH: ABO/RH(D): B POS

## 2019-05-23 LAB — APTT: aPTT: 51 seconds — ABNORMAL HIGH (ref 24–36)

## 2019-05-23 NOTE — Consult Note (Signed)
Referring Provider: ? [facility], Dr. Alanda Slim Primary Care Physician:  Benita Stabile, MD Primary Gastroenterologist:  Essie Christine  Reason for Consultation:  Rectal bleeding  HPI: Stacie Wright is a 85 y.o. female with history of advanced Alzheimer's dementia, anxiety, depression, COPD, LLE DVT in 2008 on warfarin, HTN not on medication, Mycobacterium avium-intracellulare infection and normocytic anemia brought to ED from SNF for rectal bleeding.  Apparently the hospitalist has attempted to contact the family multiple times without response.  No family has called per her nurse.  Patient not able to provide history and is barely verbal.  Apparently one of the nurse techs at her living facility noted some blood in her stool during changing.  She has been on coumadin for history of DVT in 2008 from what I can tell.  INR was 4.1 and down to 3.3 today.  Hgb 11.3 grams this AM.  Iron studies are normal.  Was heme positive in the ED.  Nurse reports no stools or sign of bleeding since her admission.   Past Medical History:  Diagnosis Date  . Alzheimer's dementia (HCC)   . Anxiety   . Arthritis   . Bronchiectasis (HCC) 2014  . COPD (chronic obstructive pulmonary disease) (HCC)   . DVT (deep venous thrombosis) (HCC) 2008   LLE  . Gout   . Headache(784.0)   . Hypertension   . IBS (irritable bowel syndrome)   . MAI (mycobacterium avium-intracellulare) infection (HCC) 2014  . Mold exposure 06/20/2015  . Pneumonia 04/2013    Past Surgical History:  Procedure Laterality Date  . ABDOMINAL HYSTERECTOMY    . CATARACT EXTRACTION    . CHOLECYSTECTOMY    . FRACTURE SURGERY      Prior to Admission medications   Medication Sig Start Date End Date Taking? Authorizing Provider  acetaminophen (TYLENOL) 500 MG tablet Take 1 tablet (500 mg total) by mouth every 6 (six) hours as needed for mild pain, fever or headache. 12/03/16  Yes Vassie Loll, MD  ALPRAZolam Prudy Feeler) 0.5 MG tablet Take 1-2 tablets  (0.5-1 mg total) by mouth every 6 (six) hours as needed for anxiety. Patient taking differently: Take 0.5 mg by mouth 2 (two) times daily.  12/03/16  Yes Vassie Loll, MD  ALPRAZolam Prudy Feeler) 1 MG tablet Take 1 mg by mouth at bedtime.   Yes [provider]  chlorhexidine (PERIDEX) 0.12 % solution Use as directed 15 mLs in the mouth or throat at bedtime.   Yes [provider]  dextromethorphan (DELSYM COUGH CHILDRENS) 30 MG/5ML liquid Take 10 mLs by mouth 2 (two) times daily.   Yes [provider]  diclofenac sodium (VOLTAREN) 1 % GEL Apply 2 g topically 4 (four) times daily.   Yes [provider]  divalproex (DEPAKOTE SPRINKLE) 125 MG capsule Take 2 capsules (250 mg total) by mouth 3 (three) times daily. Patient taking differently: Take 375 mg by mouth 3 (three) times daily.  12/03/16  Yes Vassie Loll, MD  ethambutol (MYAMBUTOL) 400 MG tablet Take 1,600 mg by mouth 3 (three) times a week. Monday, Wednesday, & Friday   Yes [provider]  ipratropium-albuterol (DUONEB) 0.5-2.5 (3) MG/3ML SOLN Take 3 mLs by nebulization every 6 (six) hours as needed (SOB).   Yes [provider]  loratadine (CLARITIN) 10 MG tablet Take 10 mg by mouth daily.   Yes [provider]  Melatonin 3 MG TABS Take 3 mg by mouth at bedtime.   Yes [provider]  Menthol-Zinc Oxide  0.4-20.5 % PSTE Apply 1 application topically 2 (two) times daily.   Yes [provider]  mirtazapine (REMERON) 7.5 MG tablet Take 3.75 mg by mouth at bedtime.   Yes [provider]  OLANZapine zydis (ZYPREXA) 5 MG disintegrating tablet Take 1 tablet (5 mg total) by mouth 2 (two) times daily. Patient taking differently: Take 7.5 mg by mouth at bedtime.  12/03/16  Yes Barton Dubois, MD  PARoxetine (PAXIL) 20 MG tablet Take 10 mg by mouth daily.    Yes [provider]  rifampin (RIFADIN) 300 MG capsule Take 600 mg by mouth 3 (three) times a week. Monday,  Wednesday, & Friday   Yes [provider]  sodium fluoride (PREVIDENT 5000 PLUS) 1.1 % CREA dental cream Place 1 application onto teeth every evening.   Yes [provider]  warfarin (COUMADIN) 7.5 MG tablet Take 7.5 mg by mouth every evening.   Yes [provider]  zinc sulfate (ZINC-220) 220 (50 Zn) MG capsule Take 220 mg by mouth daily.   Yes [provider]    Current Facility-Administered Medications  Medication Dose Route Frequency Provider Last Rate Last Dose  . 0.45 % sodium chloride infusion   Intravenous Continuous Wendee Beavers T, MD 50 mL/hr at 05/22/19 1844    . acetaminophen (TYLENOL) tablet 650 mg  650 mg Oral Q6H PRN Mercy Riding, MD       Or  . acetaminophen (TYLENOL) suppository 650 mg  650 mg Rectal Q6H PRN Gonfa, Taye T, MD      . albuterol (PROVENTIL) (2.5 MG/3ML) 0.083% nebulizer solution 2.5 mg  2.5 mg Nebulization Q2H PRN Wendee Beavers T, MD      . ALPRAZolam Duanne Moron) tablet 0.5 mg  0.5 mg Oral BID Wendee Beavers T, MD   0.5 mg at 05/23/19 0826  . ALPRAZolam Duanne Moron) tablet 1 mg  1 mg Oral QHS Wendee Beavers T, MD   1 mg at 05/22/19 2144  . bisacodyl (DULCOLAX) suppository 10 mg  10 mg Rectal Daily PRN Gonfa, Taye T, MD      . divalproex (DEPAKOTE SPRINKLE) capsule 375 mg  375 mg Oral TID Mercy Riding, MD   375 mg at 05/23/19 0833  . docusate sodium (COLACE) capsule 100 mg  100 mg Oral BID Gonfa, Taye T, MD      . ethambutol (MYAMBUTOL) tablet 1,600 mg  1,600 mg Oral 3 times weekly Wendee Beavers T, MD   1,600 mg at 05/23/19 0837  . loratadine (CLARITIN) tablet 10 mg  10 mg Oral Daily Wendee Beavers T, MD   10 mg at 05/23/19 0827  . Melatonin TABS 3 mg  3 mg Oral QHS Wendee Beavers T, MD   3 mg at 05/22/19 2144  . mirtazapine (REMERON) tablet 3.75 mg  3.75 mg Oral QHS Wendee Beavers T, MD   3.75 mg at 05/22/19 2144  . OLANZapine zydis (ZYPREXA) disintegrating tablet 7.5 mg  7.5 mg Oral QHS Wendee Beavers T, MD   7.5 mg at 05/22/19 2144  . ondansetron (ZOFRAN)  tablet 4 mg  4 mg Oral Q6H PRN Gonfa, Taye T, MD       Or  . ondansetron (ZOFRAN) injection 4 mg  4 mg Intravenous Q6H PRN Gonfa, Taye T, MD      . pantoprazole (PROTONIX) injection 40 mg  40 mg Intravenous Q12H Wendee Beavers T, MD   40 mg at 05/23/19 1043  . PARoxetine (PAXIL) tablet 10 mg  10 mg  Oral Daily Almon HerculesGonfa, Taye T, MD   10 mg at 05/23/19 0830  . rifampin (RIFADIN) capsule 600 mg  600 mg Oral 3 times weekly Candelaria StagersGonfa, Taye T, MD   600 mg at 05/23/19 0827  . senna-docusate (Senokot-S) tablet 1 tablet  1 tablet Oral QHS PRN Candelaria StagersGonfa, Taye T, MD      . sodium phosphate (FLEET) 7-19 GM/118ML enema 1 enema  1 enema Rectal Once PRN Almon HerculesGonfa, Taye T, MD        Allergies as of 05/22/2019 - Review Complete 05/22/2019  Allergen Reaction Noted  . Amoxicillin Anaphylaxis and Rash 04/25/2013  . Darvocet [propoxyphene n-acetaminophen] Other (See Comments) 04/25/2013  . Penicillins Anaphylaxis and Rash 04/25/2013  . Sulfa antibiotics Anaphylaxis and Rash 04/25/2013  . Propoxyphene Other (See Comments) 01/11/2016    Family History  Problem Relation Age of Onset  . Diabetes Father   . Hypertension Father   . Rheum arthritis Father   . Lung cancer Sister        smoked    Social History   Socioeconomic History  . Marital status: Married    Spouse name: Not on file  . Number of children: Not on file  . Years of education: Not on file  . Highest education level: Not on file  Occupational History  . Occupation: Retired  Engineer, productionocial Needs  . Financial resource strain: Not on file  . Food insecurity    Worry: Not on file    Inability: Not on file  . Transportation needs    Medical: Not on file    Non-medical: Not on file  Tobacco Use  . Smoking status: Never Smoker  . Smokeless tobacco: Never Used  Substance and Sexual Activity  . Alcohol use: No  . Drug use: No  . Sexual activity: Not Currently  Lifestyle  . Physical activity    Days per week: Not on file    Minutes per session: Not on file   . Stress: Not on file  Relationships  . Social Musicianconnections    Talks on phone: Not on file    Gets together: Not on file    Attends religious service: Not on file    Active member of club or organization: Not on file    Attends meetings of clubs or organizations: Not on file    Relationship status: Not on file  . Intimate partner violence    Fear of current or ex partner: Not on file    Emotionally abused: Not on file    Physically abused: Not on file    Forced sexual activity: Not on file  Other Topics Concern  . Not on file  Social History Narrative  . Not on file    Review of Systems: ROS is O/W negative except as mentioned in HPI.  Physical Exam: Vital signs in last 24 hours: Temp:  [97.6 F (36.4 C)-98.9 F (37.2 C)] 97.6 F (36.4 C) (09/07 16100633) Pulse Rate:  [72-88] 72 (09/07 0633) Resp:  [16-20] 20 (09/07 0633) BP: (144-177)/(67-87) 148/67 (09/07 0633) SpO2:  [95 %-100 %] 99 % (09/07 96040633) Weight:  [76.7 kg] 76.7 kg (09/06 1812) Last BM Date: (PTA) General:  Alert, but sleepy and minimally verbal, cooperative in NAD.  Frail and chronically ill-appearing. Head:  Normocephalic and atraumatic. Eyes:  Sclera clear, no icterus.  Conjunctiva pink. Ears:  Normal auditory acuity. Mouth:  No deformity or lesions.   Lungs:  Clear throughout to auscultation.  No wheezes, crackles, or rhonchi.  Heart:  Regular rate and rhythm; no murmurs, clicks, rubs, or gallops. Abdomen:  Soft, non-distended.  BS present.  Non-tender. Rectal:  No external abnormalities noted.  Sacral dressing noted. Msk:  Symmetrical without gross deformities. Pulses:  Normal pulses noted. Extremities:  Without clubbing or edema. Neurologic:  Alert, but minimally responsive. Skin:  Intact without significant lesions or rashes.  Intake/Output from previous day: 09/06 0701 - 09/07 0700 In: 412.7 [I.V.:412.7] Out: 300 [Urine:300] Intake/Output this shift: Total I/O In: 120 [P.O.:120] Out: -   Lab  Results: Recent Labs    05/22/19 1429 05/23/19 0539  WBC 5.7 5.0  HGB 10.5* 11.3*  HCT 34.6* 37.8  PLT 186 143*   BMET Recent Labs    05/22/19 1429 05/23/19 0539  NA 144 143  K 4.4 3.6  CL 107 103  CO2 31 28  GLUCOSE 96 78  BUN 29* 21  CREATININE 0.75 0.68  CALCIUM 8.3* 8.8*   LFT Recent Labs    05/22/19 1429  PROT 6.8  ALBUMIN 3.2*  AST 17  ALT 16  ALKPHOS 51  BILITOT 0.3   PT/INR Recent Labs    05/22/19 1429 05/23/19 0856  LABPROT 39.1* 32.6*  INR 4.1* 3.3*   IMPRESSION:  *Rectal bleeding:  No sign of bleeding since admission.  Hgb 11.3 grams and iron studies normal.   *Coumadin coagulopathy with supratherapeutic INR:  INR 4.1 on admission and 3.3 today.  PLAN: *At this time we are going to recommend observation.  In regards to question of whether or not she can resume her coumadin, I think that the need for this needs to be reassessed as it appears that she is on this for a history of DVT in 2008.  May no longer be necessary at her age and in her current condition.  No plans for endoscopic procedures at this time.  I have asked her nurse to consult speech to assess her and determine diet recommendations, then diet can be advanced.  Princella PellegriniJessica D. Ayauna Mcnay  05/23/2019, 11:18 AM

## 2019-05-23 NOTE — Progress Notes (Addendum)
[facility]  PROGRESS NOTE  Stacie CourtDorothy H Wright ZOX:[id number] DOB: 01-Jun-1928 DOA: 05/22/2019 PCP: Benita StabileHall, [name], MD   Brief HPI:   85 year old female with a history of Alzheimer's dementia, anxiety, depression, COPD, left lower extremity DVT on warfarin, hypertension not on medications, MAI infection, normocytic anemia brought to ED from skilled nursing facility due to rectal bleeding.  Patient has advanced dementia so unable to provide any history.  No family member could be contacted by admitting physician.    Subjective   Patient seen and examined, continues to be nonverbal.   Assessment/Plan:     1. Rectal bleeding-FOBT positive, hemoglobin 11.3 which is better than 10.5 yesterday.  Patient is on warfarin for DVT.  INR was 4.1 yesterday, down to 3.3 this morning.  She is off warfarin.  GI was consulted, no plans for endoscopy.  2. Anemia of chronic disease-likely chronic blood loss anemia, FOBT positive.  MCV is 109.6.  Hemoglobin is stable.  We will continue to monitor and transfuse for hemoglobin less than 7.  Ferritin is 18, serum iron 44, TIBC 297, transferrin saturation 15%.  Folate 16.7, B12 608. But reticulocyte count is 67,000.   3. Left lower extremity DVT-patient is on warfarin, came with supratherapeutic INR, and was down to 3.3.  Warfarin is on hold.  Patient will not be candidate for anticoagulation given GI bleed.  Will try to contact family.  4. Advanced dementia-patient has Alzheimer's dementia.  No behavior disturbance.  5. Chronic COPD-no acute respiratory issue.  Continue PRN duo nebs.  6. Myocardium avium intracellulare infection-continue home ethambutol and rifampin reportedly on this since 12/20/2018.  7. Anxiety/depression-continue home Xanax, Depakote, Zyprexa, Paxil, Remeron, melatonin.    CBC: Recent Labs  Lab 05/22/19 1429 05/23/19 0539  WBC 5.7 5.0  NEUTROABS 4.0  --   HGB 10.5* 11.3*  HCT 34.6* 37.8  MCV 105.5* 109.6*  PLT 186 143*     Basic Metabolic Panel: Recent Labs  Lab 05/22/19 1429 05/23/19 0539  NA 144 143  K 4.4 3.6  CL 107 103  CO2 31 28  GLUCOSE 96 78  BUN 29* 21  CREATININE 0.75 0.68  CALCIUM 8.3* 8.8*     DVT prophylaxis: SCDs  Code Status: Full code  Family Communication: Unable to contact family.  Disposition Plan: likely home when medically ready for discharge  Pressure Injury 11/28/16 Stage I -  Intact skin with non-blanchable redness of a localized area usually over a bony prominence. Redness area on sacrum (Active)  11/28/16 1512  Location: Sacrum  Location Orientation: Mid  Staging: Stage I -  Intact skin with non-blanchable redness of a localized area usually over a bony prominence.  Wound Description (Comments): Redness area on sacrum  Present on Admission: Yes     Pressure Injury 05/22/19 Scrotum Mid Stage II -  Partial thickness loss of dermis presenting as a shallow open ulcer with a red, pink wound bed without slough. (Active)  05/22/19 1800  Location: Scrotum  Location Orientation: Mid  Staging: Stage II -  Partial thickness loss of dermis presenting as a shallow open ulcer with a red, pink wound bed without slough.  Wound Description (Comments):   Present on Admission: Yes      BMI  Estimated body mass index is 27.29 kg/m as calculated from the following:   Height as of this encounter: 5\' 6"  (1.676 m).   Weight as of this encounter: 76.7 kg.  Scheduled medications:  . ALPRAZolam  0.5 mg Oral  BID  . ALPRAZolam  1 mg Oral QHS  . divalproex  375 mg Oral TID  . docusate sodium  100 mg Oral BID  . ethambutol  1,600 mg Oral 3 times weekly  . loratadine  10 mg Oral Daily  . Melatonin  3 mg Oral QHS  . mirtazapine  3.75 mg Oral QHS  . OLANZapine zydis  7.5 mg Oral QHS  . pantoprazole (PROTONIX) IV  40 mg Intravenous Q12H  . PARoxetine  10 mg Oral Daily  . rifampin  600 mg Oral 3 times weekly     Consultants:  Gastroenterology  Procedures:     Antibiotics:   Anti-infectives (From admission, onward)   Start     Dose/Rate Route Frequency Ordered Stop   05/23/19 0900  ethambutol (MYAMBUTOL) tablet 1,600 mg    Note to Pharmacy: Monday, Wednesday, & Friday     1,600 mg Oral 3 times weekly 05/22/19 1829     05/23/19 0900  rifampin (RIFADIN) capsule 600 mg    Note to Pharmacy: Monday, Wednesday, & Friday     600 mg Oral 3 times weekly 05/22/19 1829         Objective   Vitals:   05/22/19 1729 05/22/19 1812 05/22/19 2121 05/23/19 0633  BP: (!) 160/69  (!) 177/75 (!) 148/67  Pulse: 87  85 72  Resp: 20  16 20   Temp: 98.9 F (37.2 C)  98.5 F (36.9 C) 97.6 F (36.4 C)  TempSrc: Oral  Oral Oral  SpO2: 95%  100% 99%  Weight:  76.7 kg    Height:  5\' 6"  (1.676 m)      Intake/Output Summary (Last 24 hours) at 05/23/2019 1335 Last data filed at 05/23/2019 0900 Gross per 24 hour  Intake 532.68 ml  Output 300 ml  Net 232.68 ml   Filed Weights   05/22/19 1812  Weight: 76.7 kg     Physical Examination:    General: Appears in no acute distress, nonverbal  Cardiovascular: S1-S2, regular, no murmur auscultated  Respiratory: Clear to auscultation bilaterally  Abdomen: Abdominal soft, nontender, no organomegaly  Extremities: No edema in the lower extremities  Neurologic: Alert, not oriented x3, nonverbal     Data Reviewed: I have personally reviewed following labs and imaging studies   Recent Results (from the past 240 hour(s))  SARS CORONAVIRUS 2 (TAT 6-24 HRS) Nasopharyngeal Nasopharyngeal Swab     Status: None   Collection Time: 05/22/19  4:06 PM   Specimen: Nasopharyngeal Swab  Result Value Ref Range Status   SARS Coronavirus 2 NEGATIVE NEGATIVE Final    Comment: (NOTE) SARS-CoV-2 target nucleic acids are NOT DETECTED. The SARS-CoV-2 RNA is generally detectable in upper and lower respiratory specimens during the acute phase of infection.  Negative results do not preclude SARS-CoV-2 infection, do not rule out co-infections with other pathogens, and should not be used as the sole basis for treatment or other patient management decisions. Negative results must be combined with clinical observations, patient history, and epidemiological information. The expected result is Negative. Fact Sheet for Patients: SugarRoll.be Fact Sheet for Healthcare Providers: https://www.woods-mathews.com/ This test is not yet approved or cleared by the [place] FDA and  has been authorized for detection and/or diagnosis of SARS-CoV-2 by FDA under an Emergency Use Authorization (EUA). This EUA will remain  in effect (meaning this test can be used) for the duration of the COVID-19 declaration under Section 56 4(b)(1) of the Act, 21 U.S.C. section 360bbb-3(b)(1), unless the  authorization is terminated or revoked sooner. Performed at [facility], [address], [place], [place] [address]   MRSA PCR Screening     Status: None   Collection Time: 05/22/19  6:18 PM   Specimen: Nasal Mucosa; Nasopharyngeal  Result Value Ref Range Status   MRSA by PCR NEGATIVE NEGATIVE Final    Comment:        The GeneXpert MRSA Assay (FDA approved for NASAL specimens only), is one component of a comprehensive MRSA colonization surveillance program. It is not intended to diagnose MRSA infection nor to guide or monitor treatment for MRSA infections. Performed at [facility], [address], [place], [place] [address]      Liver Function Tests: Recent Labs  Lab 05/22/19 1429  AST 17  ALT 16  ALKPHOS 51  BILITOT 0.3  PROT 6.8  ALBUMIN 3.2*    Admission status: Inpatient: Based on patients clinical presentation and evaluation of above clinical data, I have made determination that patient meets Inpatient criteria at this time.  Time spent: 25 min  Meredeth Ide   [facility] Pager [phone]. If 7PM-7AM, please contact night-coverage at [email or web address], Office  [phone]  password [email or web address]  05/23/2019, 1:35 PM  LOS: 0 days

## 2019-05-24 DIAGNOSIS — K922 Gastrointestinal hemorrhage, unspecified: Secondary | ICD-10-CM | POA: Diagnosis not present

## 2019-05-24 DIAGNOSIS — K921 Melena: Principal | ICD-10-CM

## 2019-05-24 DIAGNOSIS — R791 Abnormal coagulation profile: Secondary | ICD-10-CM | POA: Diagnosis not present

## 2019-05-24 LAB — BASIC METABOLIC PANEL
Anion gap: 6 (ref 5–15)
BUN: 14 mg/dL (ref 8–23)
CO2: 30 mmol/L (ref 22–32)
Calcium: 8.5 mg/dL — ABNORMAL LOW (ref 8.9–10.3)
Chloride: 104 mmol/L (ref 98–111)
Creatinine, Ser: 0.63 mg/dL (ref 0.44–1.00)
GFR calc Af Amer: >60 mL/min (ref >60)
GFR calc non Af Amer: >60 mL/min (ref >60)
Glucose, Bld: 77 mg/dL (ref 70–99)
Potassium: 3.9 mmol/L (ref 3.5–5.1)
Sodium: 140 mmol/L (ref 135–145)

## 2019-05-24 LAB — CBC
HCT: 41.3 % (ref 36.0–46.0)
Hemoglobin: 13.2 g/dL (ref 12.0–15.0)
MCH: 32.9 pg (ref 26.0–34.0)
MCHC: 32 g/dL (ref 30.0–36.0)
MCV: 103 fL — ABNORMAL HIGH (ref 80.0–100.0)
Platelets: 120 10*3/uL — ABNORMAL LOW (ref 150–400)
RBC: 4.01 MIL/uL (ref 3.87–5.11)
RDW: 12.6 % (ref 11.5–15.5)
WBC: 5.2 10*3/uL (ref 4.0–10.5)
nRBC: 0 % (ref 0.0–0.2)

## 2019-05-24 NOTE — Progress Notes (Signed)
     Progress Note    ASSESSMENT AND PLAN:   85 yo female with advanced Alzheimer's dementia admitted from facility with painless rectal bleeding on warfarin with INR of 4.1. She cannot provide history but facility reported blood in stool during diaper change.. Hgb is stable, actually up overnight from 11.3 to 13.2. I spoke with RN and Nursing Assistant. No BMs or bleeding during night or this am.  -Multiple unsuccessful attempts yesterday to reach family by phone. I called number for spouse Stacie Wright this am -no answer and no voice mail.  -No endoscopic evaluation planned. Bleeding seems to have been minor / self-limiting. Hemorrhoidal?       SUBJECTIVE   Says she is just "hanging here". Doesn't offer any complaints  OBJECTIVE:     Vital signs in last 24 hours: Temp:  [97.6 F (36.4 C)-98.5 F (36.9 C)] 97.6 F (36.4 C) (09/08 0618) Pulse Rate:  [71-72] 72 (09/08 0618) Resp:  [16-18] 18 (09/08 0618) BP: (145-169)/(64-72) 169/64 (09/08 0618) SpO2:  [97 %-100 %] 100 % (09/08 0618) Last BM Date: (PTA) General:   Alert, [name] EENT:  Normal hearing, non icteric sclera, conjunctive pink.  Heart:  Regular rate and rhythm;  No lower extremity edema   Pulm: Normal respiratory effort Abdomen:  Soft, nondistended, nontender.  Normal bowel sounds.    Neurologic:  Alert and  oriented x4;  grossly normal neurologically. Psych:  Pleasant, cooperative.  Normal mood and affect.   Intake/Output from previous day: 09/07 0701 - 09/08 0700 In: 1069.9 [P.O.:120; I.V.:949.9] Out: 1100 [Urine:1100] Intake/Output this shift: Total I/O In: 50 [P.O.:50] Out: -   Lab Results: Recent Labs    05/22/19 1429 05/23/19 0539 05/24/19 0425  WBC 5.7 5.0 5.2  HGB 10.5* 11.3* 13.2  HCT 34.6* 37.8 41.3  PLT 186 143* 120*   BMET Recent Labs    05/22/19 1429 05/23/19 0539 05/24/19 0425  NA 144 143 140  K 4.4 3.6 3.9  CL 107 103 104  CO2 31 28 30   GLUCOSE 96 78 77  BUN 29* 21 14   CREATININE 0.75 0.68 0.63  CALCIUM 8.3* 8.8* 8.5*   LFT Recent Labs    05/22/19 1429  PROT 6.8  ALBUMIN 3.2*  AST 17  ALT 16  ALKPHOS 51  BILITOT 0.3   PT/INR Recent Labs    05/22/19 1429 05/23/19 0856  LABPROT 39.1* 32.6*  INR 4.1* 3.3*    Active Problems:   GI bleed     LOS: 0 days   Tye Savoy ,NP 05/24/2019, 10:00 AM

## 2019-05-24 NOTE — Plan of Care (Signed)
Pt with Dementia Confusion

## 2019-05-24 NOTE — Progress Notes (Addendum)
[facility]  PROGRESS NOTE  Stacie Wright QAS:[id number] DOB: 07-01-1928 DOA: 05/22/2019 PCP: Celene Squibb, MD   Brief HPI:   85 year old female with a history of Alzheimer's dementia, anxiety, depression, COPD, left lower extremity DVT on warfarin, hypertension not on medications, MAI infection, normocytic anemia brought to ED from skilled nursing facility due to rectal bleeding.  Patient has advanced dementia so unable to provide any history.  No family member could be contacted by admitting physician.    Subjective   Patient seen and examined, denies abdominal pain.   Assessment/Plan:     1. Rectal bleeding-FOBT positive, hemoglobin is 13.2 which is better than 10.5 yesterday.  Patient is on warfarin for DVT.  INR was 4.1 yesterday, down to 3.3 yesterday morning.  She is off warfarin.  GI was consulted, no plans for endoscopy.  If she rebleeds consider, CT abdomen pelvis.  Tried to contact family multiple times but unable to contact.  2. Dysphagia-concern for dysphagia per nursing staff.  We will keep her n.p.o. and obtain swallow evaluation.  3. Anemia of chronic disease-likely chronic blood loss anemia, FOBT positive.  MCV is 109.6.  Hemoglobin is stable.  We will continue to monitor and transfuse for hemoglobin less than 7.  Ferritin is 18, serum iron 44, TIBC 297, transferrin saturation 15%.  Folate 16.7, B12 608. But reticulocyte count is 67,000.   4. Left lower extremity DVT-patient is on warfarin, came with supratherapeutic INR, and was down to 3.3.  Warfarin is on hold.  Patient will not be candidate for anticoagulation given GI bleed.  Multiple attempts to contact family has been unsuccessful by admitting physician by myself and GI team.  We will continue to hold warfarin for now.  Consider restarting warfarin at a lower dose versus stopping it altogether.   5. Advanced dementia-patient has Alzheimer's dementia.  No behavior disturbance.  6. Chronic COPD-no acute  respiratory issue.  Continue PRN duo nebs.  7. Myocardium avium intracellulare infection-continue home ethambutol and rifampin reportedly on this since 12/20/2018.  8. Anxiety/depression-continue home Xanax, Depakote, Zyprexa, Paxil, Remeron, melatonin.    CBC: Recent Labs  Lab 05/22/19 1429 05/23/19 0539 05/24/19 0425  WBC 5.7 5.0 5.2  NEUTROABS 4.0  --   --   HGB 10.5* 11.3* 13.2  HCT 34.6* 37.8 41.3  MCV 105.5* 109.6* 103.0*  PLT 186 143* 120*    Basic Metabolic Panel: Recent Labs  Lab 05/22/19 1429 05/23/19 0539 05/24/19 0425  NA 144 143 140  K 4.4 3.6 3.9  CL 107 103 104  CO2 31 28 30   GLUCOSE 96 78 77  BUN 29* 21 14  CREATININE 0.75 0.68 0.63  CALCIUM 8.3* 8.8* 8.5*     DVT prophylaxis: SCDs  Code Status: Full code  Family Communication: Unable to contact family.  Disposition Plan: likely back to skilled nursing facility in a.m.  Pressure Injury 11/28/16 Stage I -  Intact skin with non-blanchable redness of a localized area usually over a bony prominence. Redness area on sacrum (Active)  11/28/16 1512  Location: Sacrum  Location Orientation: Mid  Staging: Stage I -  Intact skin with non-blanchable redness of a localized area usually over a bony prominence.  Wound Description (Comments): Redness area on sacrum  Present on Admission: Yes     Pressure Injury 05/22/19 Scrotum Mid Stage II -  Partial thickness loss of dermis presenting as a shallow open ulcer with a red, pink wound bed without slough. (Active)  05/22/19 1800  Location: Scrotum  Location Orientation: Mid  Staging: Stage II -  Partial thickness loss of dermis presenting as a shallow open ulcer with a red, pink wound bed without slough.  Wound Description (Comments):   Present on Admission: Yes      BMI  Estimated body mass index is 27.29 kg/m as calculated from the following:   Height as of this encounter: 5\' 6"  (1.676 m).   Weight as of this encounter: 76.7 kg.  Scheduled  medications:  . ALPRAZolam  0.5 mg Oral BID  . ALPRAZolam  1 mg Oral QHS  . divalproex  375 mg Oral TID  . docusate sodium  100 mg Oral BID  . ethambutol  1,600 mg Oral 3 times weekly  . loratadine  10 mg Oral Daily  . Melatonin  3 mg Oral QHS  . mirtazapine  3.75 mg Oral QHS  . OLANZapine zydis  7.5 mg Oral QHS  . pantoprazole (PROTONIX) IV  40 mg Intravenous Q12H  . PARoxetine  10 mg Oral Daily  . rifampin  600 mg Oral 3 times weekly    Consultants:  Gastroenterology  Procedures:     Antibiotics:   Anti-infectives (From admission, onward)   Start     Dose/Rate Route Frequency Ordered Stop   05/23/19 0900  ethambutol (MYAMBUTOL) tablet 1,600 mg    Note to Pharmacy: Monday, Wednesday, & Friday     1,600 mg Oral 3 times weekly 05/22/19 1829     05/23/19 0900  rifampin (RIFADIN) capsule 600 mg    Note to Pharmacy: Monday, Wednesday, & Friday     600 mg Oral 3 times weekly 05/22/19 1829         Objective   Vitals:   05/23/19 1300 05/23/19 2133 05/24/19 0618 05/24/19 1243  BP: (!) 145/72 (!) 151/67 (!) 169/64 (!) 147/54  Pulse: 71 71 72 71  Resp: 16 18 18 15   Temp: 97.7 F (36.5 C) 98.5 F (36.9 C) 97.6 F (36.4 C) 97.9 F (36.6 C)  TempSrc: Axillary Oral Oral Oral  SpO2: 97% 97% 100% 96%  Weight:      Height:        Intake/Output Summary (Last 24 hours) at 05/24/2019 1529 Last data filed at 05/24/2019 0488 Gross per 24 hour  Intake 999.94 ml  Output 900 ml  Net 99.94 ml   Filed Weights   05/22/19 1812  Weight: 76.7 kg     Physical Examination:    General-appears in no acute distress  Heart-S1-S2, regular, no murmur auscultated  Lungs-clear to auscultation bilaterally, no wheezing or crackles auscultated  Abdomen-soft, nontender, no organomegaly  Extremities-no edema in the lower extremities  Neuro-alert, oriented to self only     Data Reviewed: I have personally reviewed following labs and imaging studies   Recent Results (from the  past 240 hour(s))  SARS CORONAVIRUS 2 (TAT 6-24 HRS) Nasopharyngeal Nasopharyngeal Swab     Status: None   Collection Time: 05/22/19  4:06 PM   Specimen: Nasopharyngeal Swab  Result Value Ref Range Status   SARS Coronavirus 2 NEGATIVE NEGATIVE Final    Comment: (NOTE) SARS-CoV-2 target nucleic acids are NOT DETECTED. The SARS-CoV-2 RNA is generally detectable in upper and lower respiratory specimens during the acute phase of infection. Negative results do not preclude SARS-CoV-2 infection, do not rule out co-infections with other pathogens, and should not be used as the sole basis for treatment or other patient management decisions. Negative results must be combined with clinical  observations, patient history, and epidemiological information. The expected result is Negative. Fact Sheet for Patients: HairSlick.nohttps://www.fda.gov/media/138098/download Fact Sheet for Healthcare Providers: quierodirigir.comhttps://www.fda.gov/media/138095/download This test is not yet approved or cleared by the [place] FDA and  has been authorized for detection and/or diagnosis of SARS-CoV-2 by FDA under an Emergency Use Authorization (EUA). This EUA will remain  in effect (meaning this test can be used) for the duration of the COVID-19 declaration under Section 56 4(b)(1) of the Act, 21 U.S.C. section 360bbb-3(b)(1), unless the authorization is terminated or revoked sooner. Performed at [facility], [address] [place], [place] [address]   MRSA PCR Screening     Status: None   Collection Time: 05/22/19  6:18 PM   Specimen: Nasal Mucosa; Nasopharyngeal  Result Value Ref Range Status   MRSA by PCR NEGATIVE NEGATIVE Final    Comment:        The GeneXpert MRSA Assay (FDA approved for NASAL specimens only), is one component of a comprehensive MRSA colonization surveillance program. It is not intended to diagnose MRSA infection nor to guide or monitor treatment for MRSA infections. Performed at [facility], [address] [place], [place] [address]      Liver Function Tests: Recent Labs  Lab 05/22/19 1429  AST 17  ALT 16  ALKPHOS 51  BILITOT 0.3  PROT 6.8  ALBUMIN 3.2*    Admission status: Inpatient: Based on patients clinical presentation and evaluation of above clinical data, I have made determination that patient meets Inpatient criteria at this time.  Time spent: 25 min  Meredeth IdeGagan S Alixis Harmon   [facility] Pager [phone]. If 7PM-7AM, please contact night-coverage at [email or web address], Office  [phone]  password [email or web address]  05/24/2019, 3:29 PM  LOS: 0 days

## 2019-05-25 DIAGNOSIS — J449 Chronic obstructive pulmonary disease, unspecified: Secondary | ICD-10-CM | POA: Diagnosis present

## 2019-05-25 DIAGNOSIS — F0280 Dementia in other diseases classified elsewhere without behavioral disturbance: Secondary | ICD-10-CM | POA: Diagnosis present

## 2019-05-25 DIAGNOSIS — D500 Iron deficiency anemia secondary to blood loss (chronic): Secondary | ICD-10-CM | POA: Diagnosis present

## 2019-05-25 DIAGNOSIS — E876 Hypokalemia: Secondary | ICD-10-CM | POA: Diagnosis present

## 2019-05-25 DIAGNOSIS — G309 Alzheimer's disease, unspecified: Secondary | ICD-10-CM | POA: Diagnosis present

## 2019-05-25 DIAGNOSIS — I825Y2 Chronic embolism and thrombosis of unspecified deep veins of left proximal lower extremity: Secondary | ICD-10-CM | POA: Diagnosis not present

## 2019-05-25 DIAGNOSIS — L89892 Pressure ulcer of other site, stage 2: Secondary | ICD-10-CM | POA: Diagnosis present

## 2019-05-25 DIAGNOSIS — F329 Major depressive disorder, single episode, unspecified: Secondary | ICD-10-CM | POA: Diagnosis present

## 2019-05-25 DIAGNOSIS — D638 Anemia in other chronic diseases classified elsewhere: Secondary | ICD-10-CM | POA: Diagnosis present

## 2019-05-25 DIAGNOSIS — I82502 Chronic embolism and thrombosis of unspecified deep veins of left lower extremity: Secondary | ICD-10-CM | POA: Diagnosis present

## 2019-05-25 DIAGNOSIS — R791 Abnormal coagulation profile: Secondary | ICD-10-CM | POA: Diagnosis present

## 2019-05-25 DIAGNOSIS — Z90710 Acquired absence of both cervix and uterus: Secondary | ICD-10-CM | POA: Diagnosis not present

## 2019-05-25 DIAGNOSIS — R1310 Dysphagia, unspecified: Secondary | ICD-10-CM | POA: Diagnosis present

## 2019-05-25 DIAGNOSIS — L89152 Pressure ulcer of sacral region, stage 2: Secondary | ICD-10-CM | POA: Diagnosis present

## 2019-05-25 DIAGNOSIS — K922 Gastrointestinal hemorrhage, unspecified: Secondary | ICD-10-CM | POA: Diagnosis not present

## 2019-05-25 DIAGNOSIS — D539 Nutritional anemia, unspecified: Secondary | ICD-10-CM | POA: Diagnosis present

## 2019-05-25 DIAGNOSIS — G4700 Insomnia, unspecified: Secondary | ICD-10-CM | POA: Diagnosis present

## 2019-05-25 DIAGNOSIS — A310 Pulmonary mycobacterial infection: Secondary | ICD-10-CM | POA: Diagnosis present

## 2019-05-25 DIAGNOSIS — M109 Gout, unspecified: Secondary | ICD-10-CM | POA: Diagnosis present

## 2019-05-25 DIAGNOSIS — K589 Irritable bowel syndrome without diarrhea: Secondary | ICD-10-CM | POA: Diagnosis present

## 2019-05-25 DIAGNOSIS — Z66 Do not resuscitate: Secondary | ICD-10-CM | POA: Diagnosis present

## 2019-05-25 DIAGNOSIS — Z20828 Contact with and (suspected) exposure to other viral communicable diseases: Secondary | ICD-10-CM | POA: Diagnosis present

## 2019-05-25 DIAGNOSIS — K921 Melena: Secondary | ICD-10-CM | POA: Diagnosis present

## 2019-05-25 DIAGNOSIS — F0390 Unspecified dementia without behavioral disturbance: Secondary | ICD-10-CM | POA: Diagnosis not present

## 2019-05-25 DIAGNOSIS — Z86718 Personal history of other venous thrombosis and embolism: Secondary | ICD-10-CM | POA: Diagnosis not present

## 2019-05-25 DIAGNOSIS — F419 Anxiety disorder, unspecified: Secondary | ICD-10-CM | POA: Diagnosis present

## 2019-05-25 DIAGNOSIS — Z23 Encounter for immunization: Secondary | ICD-10-CM | POA: Diagnosis not present

## 2019-05-25 DIAGNOSIS — I10 Essential (primary) hypertension: Secondary | ICD-10-CM | POA: Diagnosis present

## 2019-05-25 LAB — CBC
HCT: 32.8 % — ABNORMAL LOW (ref 36.0–46.0)
Hemoglobin: 10.2 g/dL — ABNORMAL LOW (ref 12.0–15.0)
MCH: 32.1 pg (ref 26.0–34.0)
MCHC: 31.1 g/dL (ref 30.0–36.0)
MCV: 103.1 fL — ABNORMAL HIGH (ref 80.0–100.0)
Platelets: 148 10*3/uL — ABNORMAL LOW (ref 150–400)
RBC: 3.18 MIL/uL — ABNORMAL LOW (ref 3.87–5.11)
RDW: 12.6 % (ref 11.5–15.5)
WBC: 5 10*3/uL (ref 4.0–10.5)
nRBC: 0 % (ref 0.0–0.2)

## 2019-05-25 LAB — PROTIME-INR
INR: 2.8 — ABNORMAL HIGH (ref 0.8–1.2)
Prothrombin Time: 29.1 seconds — ABNORMAL HIGH (ref 11.4–15.2)

## 2019-05-25 NOTE — Progress Notes (Signed)
Patient much more alert now than she was earlier today.  This is the first meal that the patient has been alert enough to actually eat.  She ate ~10 bites of cream of chicken soup, some chocolate pudding, and a little bit of jello.  She only had a mild cough once at the very end.  Will report to oncoming RN.

## 2019-05-25 NOTE — Progress Notes (Signed)
Delay on medication administration today d/t patient lethargy. Patient finally starting to wake up a little bit stating she was cold. Attempted to administer meds but patient was pocketing crushed meds in apple sauce as well as refusing to swallow even after many prompting attempts.  Holding meds at this time.  Paged MD regarding this. ST already involved and knows about needed eval but patient was too lethargic to participate this morning for her. Will continue to monitor.

## 2019-05-25 NOTE — Progress Notes (Signed)
Returned phone call to patient son Louie Casa for updates on his mom. Louie Casa has requested a call  From the attending MD about the plan of care for the patient. Louie Casa has requested MD to call his shop number at [phone] or home number at [phone].

## 2019-05-25 NOTE — Evaluation (Signed)
Clinical/Bedside Swallow Evaluation Patient Details  Name: Stacie Wright MRN: [id number] Date of Birth: 15-Oct-1927  Today's Date: 05/25/2019 Time: SLP Start Time (ACUTE ONLY): 1502 SLP Stop Time (ACUTE ONLY): 1527 SLP Time Calculation (min) (ACUTE ONLY): 25 min  Past Medical History:  Past Medical History:  Diagnosis Date  . Alzheimer's dementia ([place])   . Anxiety   . Arthritis   . Bronchiectasis ([place]) 2014  . COPD (chronic obstructive pulmonary disease) ([place])   . DVT (deep venous thrombosis) ([place]) 2008   LLE  . Gout   . Headache(784.0)   . Hypertension   . IBS (irritable bowel syndrome)   . MAI (mycobacterium avium-intracellulare) infection ([place]) 2014  . Mold exposure 06/20/2015  . Pneumonia 04/2013   Past Surgical History:  Past Surgical History:  Procedure Laterality Date  . ABDOMINAL HYSTERECTOMY    . CATARACT EXTRACTION    . CHOLECYSTECTOMY    . FRACTURE SURGERY     HPI:  85 yo female adm to [facility] with AMS- rectal bleed, Pt has h/o very advanced dementia, and has not been swallowing medications per RN as she is orally holding all intake.  Per notes, no plan for aggressive GI treatment. Pt has h/o esophageal dysmotility and vallecular residuals diagnosed in 2016 per esophagram.     Assessment / Plan / Recommendation Clinical Impression  Patient presents with cognitive based oral dysphagia but no indication of aspiration with all po observed including magic cup, icecream, gingerale and apple juice.  Decreased labial closure results in labial spillage bilaterally with liquids and pt did not form adequate suction on straw.  She did however accept and swallow tsps and cup boluses of thin, albeit delayed and erquiring verbal cue x2 after orally holding for 15 seconds.  She responded to verbal cue to swallow with first attempt but second required dry spoon to tongue to trigger oropharyngeal swallow.  Did not attempt solids due to pt's level of oral holding, transiting difficulties.   Hand over hand assist improves pt's swallowing/neurological function.  SLP will follow up for po tolerance and education of family.  SLP left 50s music on the computer for pt's listening pleasure.  Skilled intervention including determining compensation strategies that are effective for pt.    SLP Visit Diagnosis: Dysphagia, oral phase (R13.11)    Aspiration Risk  Moderate aspiration risk    Diet Recommendation Other (Comment)(full liquids, pt states gingeral is too "hot" for her)   Liquid Administration via: Cup;Spoon Medication Administration: (try whole with pudding or magic cup or ice cream, anticipate she will not swallow pills crushed due to lack of palatibility) Supervision: Staff to assist with self feeding Compensations: Slow rate;Small sips/bites(use dry spoon to elicit swallow prn, verbally cue to swallow prn) Postural Changes: Seated upright at 90 degrees    Other  Recommendations Oral Care Recommendations: (oral care after po intake)   Follow up Recommendations Skilled Nursing facility      Frequency and Duration min 1 x/week  1 week       Prognosis Prognosis for Safe Diet Advancement: Fair Barriers to Reach Goals: Cognitive deficits      Swallow Study   General Date of Onset: 05/25/19 HPI: 85 yo female adm to [facility] with AMS- rectal bleed, Pt has h/o very advanced dementia, and has not been swallowing medications per RN as she is orally holding all intake.  Per notes, no plan for aggressive GI treatment. Type of Study: Bedside Swallow Evaluation Previous Swallow Assessment: none in system  Diet Prior to this Study: Dysphagia 1 (puree);Thin liquids Temperature Spikes Noted: No Respiratory Status: Room air History of Recent Intubation: No Behavior/Cognition: Alert;Cooperative;Pleasant mood Oral Cavity Assessment: Within Functional Limits Oral Care Completed by SLP: No Oral Cavity - Dentition: Adequate natural dentition Vision: Functional for  self-feeding Self-Feeding Abilities: Able to feed self(with hand over hand assistance) Patient Positioning: Upright in bed Baseline Vocal Quality: Normal Volitional Cough: Other (Comment) Volitional Swallow: Unable to elicit    Oral/Motor/Sensory Function Overall Oral Motor/Sensory Function: Generalized oral weakness(? mild left facial asymmetry) Lingual ROM: (limited ROM) Velum: (mild erythema on left soft palate, denies pain)   Ice Chips Ice chips: Impaired Presentation: Spoon Pharyngeal Phase Impairments: Suspected delayed Swallow   Thin Liquid Thin Liquid: Impaired Presentation: Cup;Spoon;Straw Oral Phase Impairments: Reduced labial seal;Poor awareness of bolus Oral Phase Functional Implications: Prolonged oral transit;Right anterior spillage;Left anterior spillage Pharyngeal  Phase Impairments: Suspected delayed Swallow Other Comments: pt did not form suction on straw with two attempts; she did accept thin via cup and tsp, delayed swallow up to 15 seconds requiring verbal cue to swallow but his was effective    Nectar Thick Nectar Thick Liquid: Not tested   Honey Thick Honey Thick Liquid: Not tested   Puree Puree: Impaired Presentation: Spoon Oral Phase Impairments: Reduced labial seal;Reduced lingual movement/coordination Oral Phase Functional Implications: Prolonged oral transit;Oral residue Pharyngeal Phase Impairments: Suspected delayed Swallow Other Comments: oral holding at times requiring dry spoon to elicit swallow and at other times responding to verbal cues to swallow   Solid     Solid: Not tested      Chales AbrahamsKimball, Demeka Sutter Ann 05/25/2019,3:41 PM   Donavan Burnetamara Thaddeus Evitts, MS [facility] SLP Acute Rehab Services Pager [phone] Office [phone]

## 2019-05-25 NOTE — Progress Notes (Signed)
PROGRESS NOTE    Stacie Wright  XBJ:[id number] DOB: Sep 15, 1928 DOA: 05/22/2019 PCP: Stacie Wright, [name], MD    Brief Narrative:  85 year old female with a history of Alzheimer's dementia, anxiety, depression, COPD, left lower extremity DVT on warfarin, hypertension not on medications, MAI infection, normocytic anemia brought to ED from skilled nursing facility due to rectal bleeding.  Patient has advanced dementia so unable to provide any history.  No family member could be contacted by admitting physician.  Assessment & Plan:   Active Problems:   Depression   Pressure injury of skin   GI bleed   Hematochezia   1. Rectal bleeding-FOBT positive. Now off warfarin.  GI was consulted, no plans for endoscopy.  If she rebleeds consider, CT abdomen pelvis. INR currently 2.8  2. Dysphagia-concern for dysphagia per nursing staff.  Seen by SLP with recommendation for full liquid diet  3. Anemia of chronic disease-likely chronic blood loss anemia, FOBT positive.  MCV is 109.6.  Hemoglobin thus far remains stable. Will repeat cbc in AM   4. Left lower extremity DVT-patient is on warfarin, came with supratherapeutic INR.  Warfarin is on hold.  Patient will not be candidate for anticoagulation given GI bleed. Will continue to hold warfarin for now.  Consider restarting warfarin at a lower dose versus stopping it altogether.   5. Advanced dementia-patient has Alzheimer's dementia. This AM, patient noted to be lethargic, unable to arouse or take medications. Unable to vocalize  6. Chronic COPD-no acute respiratory issue.  Continue PRN duo nebs.  7. Myocardium avium intracellulare infection-continue home ethambutol and rifampin reportedly on this since 12/20/2018.  8. Anxiety/depression-continue home Xanax, Depakote, Zyprexa, Paxil, Remeron, melatonin. Unable to assess as pt was unable to arouse this AM  DVT prophylaxis: SCD's Code Status: DNR Family Communication: Pt in room, family not at  bedside Disposition Plan: Uncertain at this time  Consultants:   GI  Procedures:     Antimicrobials: Anti-infectives (From admission, onward)   Start     Dose/Rate Route Frequency Ordered Stop   05/23/19 0900  ethambutol (MYAMBUTOL) tablet 1,600 mg    Note to Pharmacy: Monday, Wednesday, & Friday     1,600 mg Oral 3 times weekly 05/22/19 1829     05/23/19 0900  rifampin (RIFADIN) capsule 600 mg    Note to Pharmacy: Monday, Wednesday, & Friday     600 mg Oral 3 times weekly 05/22/19 1829         Subjective: Unable to assess as pt was unarouseable  Objective: Vitals:   05/24/19 1243 05/24/19 2020 05/25/19 0430 05/25/19 1552  BP: (!) 147/54 (!) 154/97 (!) 133/55 (!) 156/62  Pulse: 71 81 72 74  Resp: 15 20 20 18   Temp: 97.9 F (36.6 C) 98.2 F (36.8 C) 98 F (36.7 C) 98 F (36.7 C)  TempSrc: Oral Oral  Oral  SpO2: 96% 97% 97%   Weight:      Height:        Intake/Output Summary (Last 24 hours) at 05/25/2019 1627 Last data filed at 05/25/2019 1220 Gross per 24 hour  Intake 20 ml  Output 1150 ml  Net -1130 ml   Filed Weights   05/22/19 1812  Weight: 76.7 kg    Examination:  General exam: Appears calm and comfortable  Respiratory system: Clear to auscultation. Respiratory effort normal. Cardiovascular system: S1 & S2 heard, RRR. Gastrointestinal system: Abdomen is nondistended, soft and nontender. No organomegaly or masses felt. Normal bowel sounds heard. Central  nervous system: Asleep, difficult to arouse, No focal neurological deficits. Extremities: Symmetric 5 x 5 power. Skin: No rashes, lesions  Psychiatry: Unable to assess given level of mentation   Data Reviewed: I have personally reviewed following labs and imaging studies  CBC: Recent Labs  Lab 05/22/19 1429 05/23/19 0539 05/24/19 0425 05/25/19 0408  WBC 5.7 5.0 5.2 5.0  NEUTROABS 4.0  --   --   --   HGB 10.5* 11.3* 13.2 10.2*  HCT 34.6* 37.8 41.3 32.8*  MCV 105.5* 109.6* 103.0* 103.1*  PLT  186 143* 120* 148*   Basic Metabolic Panel: Recent Labs  Lab 05/22/19 1429 05/23/19 0539 05/24/19 0425  NA 144 143 140  K 4.4 3.6 3.9  CL 107 103 104  CO2 31 28 30   GLUCOSE 96 78 77  BUN 29* 21 14  CREATININE 0.75 0.68 0.63  CALCIUM 8.3* 8.8* 8.5*   GFR: Estimated Creatinine Clearance: 49.9 mL/min (by C-G formula based on SCr of 0.63 mg/dL). Liver Function Tests: Recent Labs  Lab 05/22/19 1429  AST 17  ALT 16  ALKPHOS 51  BILITOT 0.3  PROT 6.8  ALBUMIN 3.2*   No results for input(s): LIPASE, AMYLASE in the last 168 hours. No results for input(s): AMMONIA in the last 168 hours. Coagulation Profile: Recent Labs  Lab 05/22/19 1429 05/23/19 0856 05/25/19 0408  INR 4.1* 3.3* 2.8*   Cardiac Enzymes: No results for input(s): CKTOTAL, CKMB, CKMBINDEX, TROPONINI in the last 168 hours. BNP (last 3 results) No results for input(s): PROBNP in the last 8760 hours. HbA1C: No results for input(s): HGBA1C in the last 72 hours. CBG: No results for input(s): GLUCAP in the last 168 hours. Lipid Profile: No results for input(s): CHOL, HDL, LDLCALC, TRIG, CHOLHDL, LDLDIRECT in the last 72 hours. Thyroid Function Tests: No results for input(s): TSH, T4TOTAL, FREET4, T3FREE, THYROIDAB in the last 72 hours. Anemia Panel: Recent Labs    05/22/19 2007  VITAMINB12 608  FOLATE 16.7  FERRITIN 18  TIBC 297  IRON 44   Sepsis Labs: No results for input(s): PROCALCITON, LATICACIDVEN in the last 168 hours.  Recent Results (from the past 240 hour(s))  SARS CORONAVIRUS 2 (TAT 6-24 HRS) Nasopharyngeal Nasopharyngeal Swab     Status: None   Collection Time: 05/22/19  4:06 PM   Specimen: Nasopharyngeal Swab  Result Value Ref Range Status   SARS Coronavirus 2 NEGATIVE NEGATIVE Final    Comment: (NOTE) SARS-CoV-2 target nucleic acids are NOT DETECTED. The SARS-CoV-2 RNA is generally detectable in upper and lower respiratory specimens during the acute phase of infection. Negative  results do not preclude SARS-CoV-2 infection, do not rule out co-infections with other pathogens, and should not be used as the sole basis for treatment or other patient management decisions. Negative results must be combined with clinical observations, patient history, and epidemiological information. The expected result is Negative. Fact Sheet for Patients: HairSlick.no Fact Sheet for Healthcare Providers: quierodirigir.com This test is not yet approved or cleared by the [place] FDA and  has been authorized for detection and/or diagnosis of SARS-CoV-2 by FDA under an Emergency Use Authorization (EUA). This EUA will remain  in effect (meaning this test can be used) for the duration of the COVID-19 declaration under Section 56 4(b)(1) of the Act, 21 U.S.C. section 360bbb-3(b)(1), unless the authorization is terminated or revoked sooner. Performed at [facility], [address], [place], [place] [address]   MRSA PCR Screening     Status:  None   Collection Time: 05/22/19  6:18 PM   Specimen: Nasal Mucosa; Nasopharyngeal  Result Value Ref Range Status   MRSA by PCR NEGATIVE NEGATIVE Final    Comment:        The GeneXpert MRSA Assay (FDA approved for NASAL specimens only), is one component of a comprehensive MRSA colonization surveillance program. It is not intended to diagnose MRSA infection nor to guide or monitor treatment for MRSA infections. Performed at [facility] [place], [place] [address] [place], [place] [address]      Radiology Studies: No results found.  Scheduled Meds: . ALPRAZolam  0.5 mg Oral BID  . ALPRAZolam  1 mg Oral QHS  . divalproex  375 mg Oral TID  . docusate sodium  100 mg Oral BID  . ethambutol  1,600 mg Oral 3 times weekly  . loratadine  10 mg Oral Daily  . Melatonin  3 mg Oral QHS  . mirtazapine  3.75 mg Oral QHS  . OLANZapine zydis  7.5 mg Oral QHS  . pantoprazole  (PROTONIX) IV  40 mg Intravenous Q12H  . PARoxetine  10 mg Oral Daily  . rifampin  600 mg Oral 3 times weekly   Continuous Infusions: . sodium chloride 50 mL/hr at 05/24/19 0200     LOS: 0 days   Marylu Lund, MD [facility] Pager On Amion  If 7PM-7AM, please contact night-coverage 05/25/2019, 4:27 PM

## 2019-05-25 NOTE — Evaluation (Signed)
SLP Cancellation Note  Patient Details Name: Stacie Wright MRN: [id number] DOB: Jan 17, 1928   Cancelled treatment:       Reason Eval/Treat Not Completed: Fatigue/lethargy limiting ability to participate  Spoke to RN regarding pt h/o esophageal dysmotility *diffuse* and vallecular residuals on esophagram in 2016.  Will continue efforts.   Luanna Salk, MS [facility] SLP Acute Rehab Services Pager [phone] Office [phone]  Macario Golds 05/25/2019, 10:02 AM

## 2019-05-26 LAB — BASIC METABOLIC PANEL
Anion gap: 13 (ref 5–15)
BUN: 11 mg/dL (ref 8–23)
CO2: 26 mmol/L (ref 22–32)
Calcium: 7.8 mg/dL — ABNORMAL LOW (ref 8.9–10.3)
Chloride: 104 mmol/L (ref 98–111)
Creatinine, Ser: 0.63 mg/dL (ref 0.44–1.00)
GFR calc Af Amer: >60 mL/min (ref >60)
GFR calc non Af Amer: >60 mL/min (ref >60)
Glucose, Bld: 85 mg/dL (ref 70–99)
Potassium: 3.2 mmol/L — ABNORMAL LOW (ref 3.5–5.1)
Sodium: 143 mmol/L (ref 135–145)

## 2019-05-26 LAB — CBC
HCT: 32.2 % — ABNORMAL LOW (ref 36.0–46.0)
Hemoglobin: 10.3 g/dL — ABNORMAL LOW (ref 12.0–15.0)
MCH: 32.8 pg (ref 26.0–34.0)
MCHC: 32 g/dL (ref 30.0–36.0)
MCV: 102.5 fL — ABNORMAL HIGH (ref 80.0–100.0)
Platelets: 143 10*3/uL — ABNORMAL LOW (ref 150–400)
RBC: 3.14 MIL/uL — ABNORMAL LOW (ref 3.87–5.11)
RDW: 12.7 % (ref 11.5–15.5)
WBC: 4.6 10*3/uL (ref 4.0–10.5)
nRBC: 0 % (ref 0.0–0.2)

## 2019-05-26 MED ORDER — POTASSIUM CHLORIDE CRYS ER 20 MEQ PO TBCR
40.0000 meq | EXTENDED_RELEASE_TABLET | Freq: Once | ORAL | Status: AC
  Administered 2019-05-26: 40 meq via ORAL
  Filled 2019-05-26: qty 2

## 2019-05-26 NOTE — Evaluation (Signed)
Occupational Therapy Evaluation Patient Details Name: Stacie Wright MRN: [id number] DOB: 01/20/1928 Today's Date: 05/26/2019    History of Present Illness 85 year old [facility]  PMH:  Alzheimer dementia, HTN, COPD, gout   Clinical Impression   Pt was admitted for the above. Evaluation limited today and focused on self feeding as pt was awake enough to eat. She follows basic commands such as opening mouth to make sure that it is clear and did initiate self feeding, but unable to do so without proximal support to bring cup/spoon to mouth.  Pt sleepy after eating. Did not mobilize this session.  Do not feel pt will benefit from further OT in acute setting.    Follow Up Recommendations  SNF    Equipment Recommendations  None recommended by OT    Recommendations for Other Services       Precautions / Restrictions Precautions Precautions: Fall Restrictions Weight Bearing Restrictions: No      Mobility Bed Mobility               General bed mobility comments: not tested--pt sleepy after few bites  Transfers                 General transfer comment: not tested    Balance                                           ADL either performed or assessed with clinical judgement   ADL Overall ADL's : Needs assistance/impaired Eating/Feeding: Maximal assistance;Total assistance;Bed level Eating/Feeding Details (indicate cue type and reason): hand over hand assist for self feeding.  Ate 2 bites of ice cream, 1 bite of grits and 3 sips of juice Grooming: Total assistance Grooming Details (indicate cue type and reason): performed oral care prior to eating; teeth have a lot of build up on them                               General ADL Comments: total A for all other adls     Vision         Perception     Praxis      Pertinent Vitals/Pain Pain Assessment: Faces Faces Pain Scale: No hurt      Hand Dominance (initiated moving LUE for self feeding)   Extremity/Trunk Assessment Upper Extremity Assessment Upper Extremity Assessment: Generalized weakness;RUE deficits/detail;LUE deficits/detail RUE Deficits / Details: AAROM bil shoulders about 90; then tight end ranges LUE Deficits / Details: initiated with LUE to take spoon.  Only moved 1/4 way through range on her own.             Communication Communication Communication: HOH   Cognition Arousal/Alertness: (initially sleeping, then alert, then sleepy again) Behavior During Therapy: WFL for tasks assessed/performed Overall Cognitive Status: No family/caregiver present to determine baseline cognitive functioning                                 General Comments: initiates, follows basic commands   General Comments       Exercises     Shoulder Instructions      Home Living Family/patient expects to be discharged to:: Skilled nursing facility  Prior Functioning/Environment Level of Independence: Needs assistance                 OT Problem List:        OT Treatment/Interventions:      OT Goals(Current goals can be found in the care plan section) Acute Rehab OT Goals Patient Stated Goal: unable to state OT Goal Formulation: All assessment and education complete, DC therapy  OT Frequency:     Barriers to D/C:            Co-evaluation              AM-PAC OT "6 Clicks" Daily Activity     Outcome Measure Help from another person eating meals?: Total Help from another person taking care of personal grooming?: Total Help from another person toileting, which includes using toliet, bedpan, or urinal?: Total Help from another person bathing (including washing, rinsing, drying)?: Total Help from another person to put on and taking off regular upper body clothing?: Total Help from another person to put on and taking off regular  lower body clothing?: Total 6 Click Score: 6   End of Session    Activity Tolerance: Patient limited by fatigue Patient left: in bed;with call bell/phone within reach;with bed alarm set  OT Visit Diagnosis: Muscle weakness (generalized) (M62.81)                Time: 5625-6389 OT Time Calculation (min): 20 min Charges:  OT General Charges $OT Visit: 1 Visit OT Evaluation $OT Eval Low Complexity: [place], OTR/L Acute Rehabilitation Services [phone] [facility] pager [phone] office 05/26/2019  [place] 05/26/2019, 10:25 AM

## 2019-05-26 NOTE — Progress Notes (Signed)
PT Cancellation Note  Patient Details Name: Stacie Wright MRN: [id number] DOB: [birth year]   Cancelled Treatment:    Reason Eval/Treat Not Completed: Fatigue/lethargy limiting ability to participate;Patient's level of consciousness. Pt admitted from SNF facility and plans are d/c back to facility. Pt has advanced dementia and no family available to determine baseline level. PT eval not completed due to not able to participate. Due not feel acute PT is indicated at this time. Recommend return to SNF facility. Pt is d/c from acute PT services.   Lelon Mast 05/26/2019, 11:33 AM

## 2019-05-26 NOTE — Progress Notes (Signed)
PROGRESS NOTE    Stacie Wright  OEC:[id number] DOB: 05-Dec-1927 DOA: 05/22/2019 PCP: Benita Stabile, MD    Brief Narrative:  85 year old female with a history of Alzheimer's dementia, anxiety, depression, COPD, left lower extremity DVT on warfarin, hypertension not on medications, MAI infection, normocytic anemia brought to ED from skilled nursing facility due to rectal bleeding.  Patient has advanced dementia so unable to provide any history.  No family member could be contacted by admitting physician.  Assessment & Plan:   Active Problems:   Depression   Pressure injury of skin   GI bleed   Hematochezia   1. Rectal bleeding-FOBT positive. Now off warfarin.  GI was consulted, no plans for endoscopy.  If she rebleeds consider, CT abdomen pelvis. Most recent INR of 2.8, stable  2. Dysphagia-concern for dysphagia per nursing staff.  Pt was earlier seen by SLP with recommendation for full liquid diet  3. Anemia of chronic disease-likely chronic blood loss anemia, FOBT positive.  MCV is 109.6.  Hemoglobin thus far remains stable. Hgb remains stable at this time  4. Left lower extremity DVT-patient is on warfarin, came with supratherapeutic INR.  Warfarin is on hold.  Patient will not be candidate for anticoagulation given GI bleed. Will continue to hold warfarin for now. Seems stable at present   5. Advanced dementia-patient has Alzheimer's dementia. Mentation seems improved this AM  6. Chronic COPD-no acute respiratory issue this AM, on minimal O2 support.  Continue PRN duo nebs.  7. Myocardium avium intracellulare infection-continue home ethambutol and rifampin reportedly on this since 12/20/2018.  8. Anxiety/depression-continue home Xanax, Depakote, Zyprexa, Paxil, Remeron, melatonin. Seems to be stable at this time  DVT prophylaxis: SCD's Code Status: DNR Family Communication: Pt in room, family not at bedside Disposition Plan: Uncertain at this time  Consultants:   GI  Procedures:     Antimicrobials: Anti-infectives (From admission, onward)   Start     Dose/Rate Route Frequency Ordered Stop   05/23/19 0900  ethambutol (MYAMBUTOL) tablet 1,600 mg    Note to Pharmacy: Monday, Wednesday, & Friday     1,600 mg Oral 3 times weekly 05/22/19 1829     05/23/19 0900  rifampin (RIFADIN) capsule 600 mg    Note to Pharmacy: Monday, Wednesday, & Friday     600 mg Oral 3 times weekly 05/22/19 1829        Subjective: Without complaints this AM  Objective: Vitals:   05/25/19 1552 05/25/19 2115 05/26/19 0511 05/26/19 1250  BP: (!) 156/62 (!) 144/78 (!) 145/56 (!) 146/64  Pulse: 74 76 77 74  Resp: 18 18 19 15   Temp: 98 F (36.7 C) 98.6 F (37 C) 97.7 F (36.5 C) 99.3 F (37.4 C)  TempSrc: Oral Oral Oral Oral  SpO2:  98% 100% 96%  Weight:      Height:        Intake/Output Summary (Last 24 hours) at 05/26/2019 1649 Last data filed at 05/26/2019 1415 Gross per 24 hour  Intake 309.97 ml  Output 725 ml  Net -415.03 ml   Filed Weights   05/22/19 1812  Weight: 76.7 kg    Examination: General exam: Awake, laying in bed, in nad Respiratory system: Normal respiratory effort, no wheezing Cardiovascular system: regular rate, s1, s2 Gastrointestinal system: Soft, nondistended, positive BS Central nervous system: CN2-12 grossly intact, strength intact Extremities: Perfused, no clubbing Skin: Normal skin turgor, no notable skin lesions seen Psychiatry: Mood normal // no visual hallucinations  Data Reviewed: I have personally reviewed following labs and imaging studies  CBC: Recent Labs  Lab 05/22/19 1429 05/23/19 0539 05/24/19 0425 05/25/19 0408 05/26/19 0502  WBC 5.7 5.0 5.2 5.0 4.6  NEUTROABS 4.0  --   --   --   --   HGB 10.5* 11.3* 13.2 10.2* 10.3*  HCT 34.6* 37.8 41.3 32.8* 32.2*  MCV 105.5* 109.6* 103.0* 103.1* 102.5*  PLT 186 143* 120* 148* 338*   Basic Metabolic Panel: Recent Labs  Lab 05/22/19 1429 05/23/19 0539 05/24/19 0425  05/26/19 0502  NA 144 143 140 143  K 4.4 3.6 3.9 3.2*  CL 107 103 104 104  CO2 31 28 30 26   GLUCOSE 96 78 77 85  BUN 29* 21 14 11   CREATININE 0.75 0.68 0.63 0.63  CALCIUM 8.3* 8.8* 8.5* 7.8*   GFR: Estimated Creatinine Clearance: 49.9 mL/min (by C-G formula based on SCr of 0.63 mg/dL). Liver Function Tests: Recent Labs  Lab 05/22/19 1429  AST 17  ALT 16  ALKPHOS 51  BILITOT 0.3  PROT 6.8  ALBUMIN 3.2*   No results for input(s): LIPASE, AMYLASE in the last 168 hours. No results for input(s): AMMONIA in the last 168 hours. Coagulation Profile: Recent Labs  Lab 05/22/19 1429 05/23/19 0856 05/25/19 0408  INR 4.1* 3.3* 2.8*   Cardiac Enzymes: No results for input(s): CKTOTAL, CKMB, CKMBINDEX, TROPONINI in the last 168 hours. BNP (last 3 results) No results for input(s): PROBNP in the last 8760 hours. HbA1C: No results for input(s): HGBA1C in the last 72 hours. CBG: No results for input(s): GLUCAP in the last 168 hours. Lipid Profile: No results for input(s): CHOL, HDL, LDLCALC, TRIG, CHOLHDL, LDLDIRECT in the last 72 hours. Thyroid Function Tests: No results for input(s): TSH, T4TOTAL, FREET4, T3FREE, THYROIDAB in the last 72 hours. Anemia Panel: No results for input(s): VITAMINB12, FOLATE, FERRITIN, TIBC, IRON, RETICCTPCT in the last 72 hours. Sepsis Labs: No results for input(s): PROCALCITON, LATICACIDVEN in the last 168 hours.  Recent Results (from the past 240 hour(s))  SARS CORONAVIRUS 2 (TAT 6-24 HRS) Nasopharyngeal Nasopharyngeal Swab     Status: None   Collection Time: 05/22/19  4:06 PM   Specimen: Nasopharyngeal Swab  Result Value Ref Range Status   SARS Coronavirus 2 NEGATIVE NEGATIVE Final    Comment: (NOTE) SARS-CoV-2 target nucleic acids are NOT DETECTED. The SARS-CoV-2 RNA is generally detectable in upper and lower respiratory specimens during the acute phase of infection. Negative results do not preclude SARS-CoV-2 infection, do not rule out  co-infections with other pathogens, and should not be used as the sole basis for treatment or other patient management decisions. Negative results must be combined with clinical observations, patient history, and epidemiological information. The expected result is Negative. Fact Sheet for Patients: SugarRoll.be Fact Sheet for Healthcare Providers: https://www.woods-mathews.com/ This test is not yet approved or cleared by the [place] FDA and  has been authorized for detection and/or diagnosis of SARS-CoV-2 by FDA under an Emergency Use Authorization (EUA). This EUA will remain  in effect (meaning this test can be used) for the duration of the COVID-19 declaration under Section 56 4(b)(1) of the Act, 21 U.S.C. section 360bbb-3(b)(1), unless the authorization is terminated or revoked sooner. Performed at [facility], [place] [address], [place], [place] [address]   MRSA PCR Screening     Status: None   Collection Time: 05/22/19  6:18 PM   Specimen: Nasal Mucosa; Nasopharyngeal  Result Value Ref Range  Status   MRSA by PCR NEGATIVE NEGATIVE Final    Comment:        The GeneXpert MRSA Assay (FDA approved for NASAL specimens only), is one component of a comprehensive MRSA colonization surveillance program. It is not intended to diagnose MRSA infection nor to guide or monitor treatment for MRSA infections. Performed at [facility], [address] [place], [place] [id number]      Radiology Studies: No results found.  Scheduled Meds: . ALPRAZolam  0.5 mg Oral BID  . divalproex  375 mg Oral TID  . docusate sodium  100 mg Oral BID  . ethambutol  1,600 mg Oral 3 times weekly  . loratadine  10 mg Oral Daily  . mirtazapine  3.75 mg Oral QHS  . OLANZapine zydis  7.5 mg Oral QHS  . pantoprazole (PROTONIX) IV  40 mg Intravenous Q12H  . PARoxetine  10 mg Oral Daily  . rifampin  600 mg Oral 3 times weekly    Continuous Infusions: . sodium chloride 50 mL/hr at 05/26/19 0405     LOS: 1 day   Rickey BarbaraStephen Brentlee Delage, MD [facility] Pager On Amion  If 7PM-7AM, please contact night-coverage 05/26/2019, 4:49 PM

## 2019-05-26 NOTE — Progress Notes (Signed)
  Speech Language Pathology Treatment: Dysphagia  Patient Details Name: Stacie Wright MRN: [id number] DOB: Oct 06, 1927 Today's Date: 05/26/2019 Time: 1510-1530 SLP Time Calculation (min) (ACUTE ONLY): 20 min  Assessment / Plan / Recommendation Clinical Impression  In am, pt was sound asleep with open mouth breathing.  This afternoon much more alert and talking when SLP walked into her room. Pt;s voice is clear and strong but she is not oriented and frequently is talking about a "store".  Pt willing to consume some po intake with SLP including ice cream, soda and root beer float.   Continued anterior loss and delayed oral transiting/oral holding observed but no indication of aspiration.  Pt did not respond to verbal cues to swallow but small amount of liquid on spoon facilitated swallow when did not elicit swallow for longer than 30 seconds.  Advised NT to this strategy and posted sign in room to assure observation of laryngeal elevation for assurance of swallow.  Pt only accepts small boluses but she is tolerating during SLP session.  It is laborious to feed her and anticipate it will be challenging for her to meet nutritional needs.  Do not recommend dietary advancement as liquids are most efficient for her to swallow and she is not appropriate for solids requiring mastication or viscous purees.    Will follow up x1 more in hopes to educate family to dysphagia with advanced dysphagia, compensation strategies and progression.  Thanks.    HPI HPI: 85 yo female adm to [facility] with AMS- rectal bleed, Pt has h/o very advanced dementia, and has not been swallowing medications per RN as she is orally holding all intake.  Per notes, no plan for aggressive GI treatment  BSE completed yesterday with pt tolerating full liquids better than solids/puree.  Follow up to assess po tolerance indicated.  Earlier in am pt was sound asleep but later she was fully alert. NT reports pt with 100% intake of lunch with good  tolerance and no overt indication of aspiration.      SLP Plan  Continue with current plan of care       Recommendations  Diet recommendations: Thin liquid(full liquids) Medication Administration: (try whole with icecream if small, anticipate she will not swallow crushed due to lack of palatiblity) Compensations: Slow rate;Small sips/bites(assure pt swallows, dry swallow or minimal extra liquid on spoon to elicit, oral care after all intake) Postural Changes and/or Swallow Maneuvers: Seated upright 90 degrees;Upright 30-60 min after meal                Follow up Recommendations: Skilled Nursing facility SLP Visit Diagnosis: Dysphagia, oral phase (R13.11) Plan: Continue with current plan of care       GO                Macario Golds 05/26/2019, 5:04 PM  Luanna Salk, [facility] SLP [place] Pager [phone] Office [phone]

## 2019-05-27 ENCOUNTER — Inpatient Hospital Stay (HOSPITAL_COMMUNITY): Payer: PPO

## 2019-05-27 DIAGNOSIS — Z86718 Personal history of other venous thrombosis and embolism: Secondary | ICD-10-CM

## 2019-05-27 LAB — CBC
HCT: 33.4 % — ABNORMAL LOW (ref 36.0–46.0)
Hemoglobin: 10.2 g/dL — ABNORMAL LOW (ref 12.0–15.0)
MCH: 32.5 pg (ref 26.0–34.0)
MCHC: 30.5 g/dL (ref 30.0–36.0)
MCV: 106.4 fL — ABNORMAL HIGH (ref 80.0–100.0)
Platelets: 144 10*3/uL — ABNORMAL LOW (ref 150–400)
RBC: 3.14 MIL/uL — ABNORMAL LOW (ref 3.87–5.11)
RDW: 12.7 % (ref 11.5–15.5)
WBC: 5.1 10*3/uL (ref 4.0–10.5)
nRBC: 0 % (ref 0.0–0.2)

## 2019-05-27 LAB — BASIC METABOLIC PANEL
Anion gap: 10 (ref 5–15)
BUN: 8 mg/dL (ref 8–23)
CO2: 29 mmol/L (ref 22–32)
Calcium: 8.3 mg/dL — ABNORMAL LOW (ref 8.9–10.3)
Chloride: 106 mmol/L (ref 98–111)
Creatinine, Ser: 0.68 mg/dL (ref 0.44–1.00)
GFR calc Af Amer: >60 mL/min (ref >60)
GFR calc non Af Amer: >60 mL/min (ref >60)
Glucose, Bld: 95 mg/dL (ref 70–99)
Potassium: 4.1 mmol/L (ref 3.5–5.1)
Sodium: 145 mmol/L (ref 135–145)

## 2019-05-27 LAB — PROTIME-INR
INR: 1.5 — ABNORMAL HIGH (ref 0.8–1.2)
Prothrombin Time: 17.6 seconds — ABNORMAL HIGH (ref 11.4–15.2)

## 2019-05-27 LAB — APTT: aPTT: 28 seconds (ref 24–36)

## 2019-05-27 MED ORDER — WARFARIN - PHARMACIST DOSING INPATIENT
Freq: Every day | Status: DC
  Administered 2019-05-30: 1

## 2019-05-27 MED ORDER — HEPARIN (PORCINE) 25000 UT/250ML-% IV SOLN
900.0000 [IU]/h | INTRAVENOUS | Status: DC
  Administered 2019-05-27: 900 [IU]/h via INTRAVENOUS
  Filled 2019-05-27: qty 250

## 2019-05-27 MED ORDER — ALPRAZOLAM 0.5 MG PO TABS: 0.5000 mg | ORAL_TABLET | Freq: Two times a day (BID) | ORAL | 0 refills | Status: AC

## 2019-05-27 MED ORDER — WARFARIN SODIUM 5 MG PO TABS
5.0000 mg | ORAL_TABLET | Freq: Once | ORAL | Status: AC
  Administered 2019-05-27: 18:00:00 5 mg via ORAL
  Filled 2019-05-27: qty 1

## 2019-05-27 NOTE — Progress Notes (Signed)
PROGRESS NOTE    Stacie CourtDorothy H Wright  NWG:[id number] DOB: Apr 15, 1928 DOA: 05/22/2019 PCP: Benita StabileHall, Stacie Z, MD    Brief Narrative:  85 year old female with a history of Alzheimer's dementia, anxiety, depression, COPD, left lower extremity DVT on warfarin, hypertension not on medications, MAI infection, normocytic anemia brought to ED from skilled nursing facility due to rectal bleeding.  Patient has advanced dementia so unable to provide any history.  No family member could be contacted by admitting physician.  Assessment & Plan:   Active Problems:   Depression   Pressure injury of skin   GI bleed   Hematochezia  1. Rectal bleeding-FOBT positive. GI was consulted, no plans for endoscopy.  If she rebleeds consider, CT abdomen pelvis. Will restart anticoagulation per below.  2. Dysphagia-concern for dysphagia per nursing staff.  Pt was earlier seen by SLP with recommendation for full liquid diet. Seems stable at this time  3. Anemia of chronic disease-likely chronic blood loss anemia, FOBT positive.  MCV is 109.6.  Hemoglobin thus far remains stable. Hgb remains stable currently  4. Chronic Left lower extremity DVT-Pt presented with supratherapeutic INR.  Warfarin was on hold. Repeat LE dopplers, reviewed with continued LLE DVT. Discussed with patient's son over phone, who agrees with re-initiating coumadin with heparin bridge. Will follow CBC trends. If evidence of bleeding, then would hold further anticoagulation at that time.  5. Advanced dementia-patient has Alzheimer's dementia. Mentation seems stable at this time  6. Chronic COPD-no acute respiratory issue this AM, on minimal O2 support.  Pt is continued on PRN duo nebs.  7. Myocardium avium intracellulare infection-continue home ethambutol and rifampin reportedly on this since 12/20/2018.  8. Anxiety/depression-continue home Xanax, Depakote, Zyprexa, Paxil, Remeron, melatonin. Noted to be stable at this time  DVT prophylaxis: SCD's  Code Status: DNR Family Communication: Pt in room, family not at bedside Disposition Plan: Uncertain at this time  Consultants:   GI  Procedures:     Antimicrobials: Anti-infectives (From admission, onward)   Start     Dose/Rate Route Frequency Ordered Stop   05/23/19 0900  ethambutol (MYAMBUTOL) tablet 1,600 mg    Note to Pharmacy: Monday, Wednesday, & Friday     1,600 mg Oral 3 times weekly 05/22/19 1829     05/23/19 0900  rifampin (RIFADIN) capsule 600 mg    Note to Pharmacy: Monday, Wednesday, & Friday     600 mg Oral 3 times weekly 05/22/19 1829        Subjective: No complaints  Objective: Vitals:   05/26/19 1250 05/26/19 2133 05/27/19 0613 05/27/19 1424  BP: (!) 146/64 137/86 (!) 167/86 (!) 148/63  Pulse: 74 69 78 77  Resp: 15 16 18 16   Temp: 99.3 F (37.4 C) 98.6 F (37 C) 97.6 F (36.4 C) 98.4 F (36.9 C)  TempSrc: Oral Oral Oral Oral  SpO2: 96% 100% 98% 97%  Weight:      Height:        Intake/Output Summary (Last 24 hours) at 05/27/2019 1550 Last data filed at 05/27/2019 1420 Gross per 24 hour  Intake 1496.47 ml  Output 700 ml  Net 796.47 ml   Filed Weights   05/22/19 1812  Weight: 76.7 kg    Examination: General exam: Conversant, in no acute distress Respiratory system: normal chest rise, clear, no audible wheezing Cardiovascular system: regular rhythm, s1-s2 Gastrointestinal system: Nondistended, nontender, pos BS Central nervous system: No seizures, no tremors Extremities: No cyanosis, no joint deformities Skin: No rashes, no pallor  Psychiatry: Affect normal // no auditory hallucinations   Data Reviewed: I have personally reviewed following labs and imaging studies  CBC: Recent Labs  Lab 05/22/19 1429 05/23/19 0539 05/24/19 0425 05/25/19 0408 05/26/19 0502  WBC 5.7 5.0 5.2 5.0 4.6  NEUTROABS 4.0  --   --   --   --   HGB 10.5* 11.3* 13.2 10.2* 10.3*  HCT 34.6* 37.8 41.3 32.8* 32.2*  MCV 105.5* 109.6* 103.0* 103.1* 102.5*  PLT  186 143* 120* 148* 854*   Basic Metabolic Panel: Recent Labs  Lab 05/22/19 1429 05/23/19 0539 05/24/19 0425 05/26/19 0502 05/27/19 0457  NA 144 143 140 143 145  K 4.4 3.6 3.9 3.2* 4.1  CL 107 103 104 104 106  CO2 31 28 30 26 29   GLUCOSE 96 78 77 85 95  BUN 29* 21 14 11 8   CREATININE 0.75 0.68 0.63 0.63 0.68  CALCIUM 8.3* 8.8* 8.5* 7.8* 8.3*   GFR: Estimated Creatinine Clearance: 49.9 mL/min (by C-G formula based on SCr of 0.68 mg/dL). Liver Function Tests: Recent Labs  Lab 05/22/19 1429  AST 17  ALT 16  ALKPHOS 51  BILITOT 0.3  PROT 6.8  ALBUMIN 3.2*   No results for input(s): LIPASE, AMYLASE in the last 168 hours. No results for input(s): AMMONIA in the last 168 hours. Coagulation Profile: Recent Labs  Lab 05/22/19 1429 05/23/19 0856 05/25/19 0408 05/27/19 1515  INR 4.1* 3.3* 2.8* 1.5*   Cardiac Enzymes: No results for input(s): CKTOTAL, CKMB, CKMBINDEX, TROPONINI in the last 168 hours. BNP (last 3 results) No results for input(s): PROBNP in the last 8760 hours. HbA1C: No results for input(s): HGBA1C in the last 72 hours. CBG: No results for input(s): GLUCAP in the last 168 hours. Lipid Profile: No results for input(s): CHOL, HDL, LDLCALC, TRIG, CHOLHDL, LDLDIRECT in the last 72 hours. Thyroid Function Tests: No results for input(s): TSH, T4TOTAL, FREET4, T3FREE, THYROIDAB in the last 72 hours. Anemia Panel: No results for input(s): VITAMINB12, FOLATE, FERRITIN, TIBC, IRON, RETICCTPCT in the last 72 hours. Sepsis Labs: No results for input(s): PROCALCITON, LATICACIDVEN in the last 168 hours.  Recent Results (from the past 240 hour(s))  SARS CORONAVIRUS 2 (TAT 6-24 HRS) Nasopharyngeal Nasopharyngeal Swab     Status: None   Collection Time: 05/22/19  4:06 PM   Specimen: Nasopharyngeal Swab  Result Value Ref Range Status   SARS Coronavirus 2 NEGATIVE NEGATIVE Final    Comment: (NOTE) SARS-CoV-2 target nucleic acids are NOT DETECTED. The SARS-CoV-2 RNA  is generally detectable in upper and lower respiratory specimens during the acute phase of infection. Negative results do not preclude SARS-CoV-2 infection, do not rule out co-infections with other pathogens, and should not be used as the sole basis for treatment or other patient management decisions. Negative results must be combined with clinical observations, patient history, and epidemiological information. The expected result is Negative. Fact Sheet for Patients: SugarRoll.be Fact Sheet for Healthcare Providers: https://www.woods-mathews.com/ This test is not yet approved or cleared by the [place] FDA and  has been authorized for detection and/or diagnosis of SARS-CoV-2 by FDA under an Emergency Use Authorization (EUA). This EUA will remain  in effect (meaning this test can be used) for the duration of the COVID-19 declaration under Section 56 4(b)(1) of the Act, 21 U.S.C. section 360bbb-3(b)(1), unless the authorization is terminated or revoked sooner. Performed at [facility], [place] [address] [place], [place] [address]   MRSA PCR Screening  Status: None   Collection Time: 05/22/19  6:18 PM   Specimen: Nasal Mucosa; Nasopharyngeal  Result Value Ref Range Status   MRSA by PCR NEGATIVE NEGATIVE Final    Comment:        The GeneXpert MRSA Assay (FDA approved for NASAL specimens only), is one component of a comprehensive MRSA colonization surveillance program. It is not intended to diagnose MRSA infection nor to guide or monitor treatment for MRSA infections. Performed at [facility], [address] [place], [place] [address]      Radiology Studies: Vas [place] Lower Extremity Venous (dvt)  Result Date: 05/27/2019  Lower Venous Study Indications: F/U from DVT of 2008 to discontinue blood thinners.  Risk Factors: DVT Lt le 2008. Limitations: Patient unable to move legs. Comparison Study: 2008  Positive lt junction of fv and profunda occluded,                   popliteal occlusion, ptv occlusion. Inconplete occlusion of                   GSV Performing Technologist: [employer] RVS  Examination Guidelines: A complete evaluation includes B-mode imaging, spectral Doppler, color Doppler, and power Doppler as needed of all accessible portions of each vessel. Bilateral testing is considered an integral part of a complete examination. Limited examinations for reoccurring indications may be performed as noted.  +---------+---------------+---------+-----------+----------+-------------------+ RIGHT    CompressibilityPhasicitySpontaneityPropertiesThrombus Aging      +---------+---------------+---------+-----------+----------+-------------------+ CFV      Full           Yes      Yes                                      +---------+---------------+---------+-----------+----------+-------------------+ SFJ      Full                                                             +---------+---------------+---------+-----------+----------+-------------------+ FV Prox  Full           Yes      Yes                                      +---------+---------------+---------+-----------+----------+-------------------+ FV Mid   Full                                                             +---------+---------------+---------+-----------+----------+-------------------+ FV DistalFull           Yes      Yes                                      +---------+---------------+---------+-----------+----------+-------------------+ PFV      Full           Yes      Yes                                      +---------+---------------+---------+-----------+----------+-------------------+  POP                     Yes                           Unable to compress                                                        due to position of                                                         the leg             +---------+---------------+---------+-----------+----------+-------------------+ PTV      Full                    Yes                                      +---------+---------------+---------+-----------+----------+-------------------+ PERO                                                  Not visualized      +---------+---------------+---------+-----------+----------+-------------------+   Right Technical Findings: Technically difficult and limited due to patient mobility  +---------+---------------+---------+-----------+----------+-------------------+ LEFT     CompressibilityPhasicitySpontaneityPropertiesThrombus Aging      +---------+---------------+---------+-----------+----------+-------------------+ CFV      Partial        Yes      Yes                  Chronic             +---------+---------------+---------+-----------+----------+-------------------+ SFJ      Partial                                      Chronic             +---------+---------------+---------+-----------+----------+-------------------+ FV Prox  Full           Yes      Yes                  Narrowed            +---------+---------------+---------+-----------+----------+-------------------+ FV Mid   Full                                                             +---------+---------------+---------+-----------+----------+-------------------+ FV DistalFull           Yes      Yes                  Narrowed            +---------+---------------+---------+-----------+----------+-------------------+  PFV      Full                                                             +---------+---------------+---------+-----------+----------+-------------------+ POP                                                   Unable to visualize                                                       due to patient                                                             immobility          +---------+---------------+---------+-----------+----------+-------------------+ PTV      Full                                                             +---------+---------------+---------+-----------+----------+-------------------+ PERO                                                  Unable to visualize                                                       due to immobility   +---------+---------------+---------+-----------+----------+-------------------+   Left Technical Findings: Technically difficult and limited due to immobility of the patient.   Summary: Right: There is no evidence of deep vein thrombosis in the lower extremity. However, portions of this examination were limited- see technologist comments above. No cystic structure found in the popliteal fossa. See technical findings listed above Left: Findings consistent with chronic deep vein thrombosis involving the left common femoral vein. Findings consistent with chronic superficial vein thrombosis involving the left great saphenous vein. There is no evidence of acute deep vein thrombosis in the lower extremity. However, portions of this examination were limited- see technologist comments above. Unable to evaluated for cystic structure. See technical findings listed above  *See table(s) above for measurements and observations.    Preliminary     Scheduled Meds: . ALPRAZolam  0.5 mg Oral BID  . divalproex  375 mg Oral TID  . docusate sodium  100 mg Oral BID  . ethambutol  1,600 mg Oral 3 times weekly  .  loratadine  10 mg Oral Daily  . mirtazapine  3.75 mg Oral QHS  . OLANZapine zydis  7.5 mg Oral QHS  . pantoprazole (PROTONIX) IV  40 mg Intravenous Q12H  . PARoxetine  10 mg Oral Daily  . rifampin  600 mg Oral 3 times weekly   Continuous Infusions: . sodium chloride 50 mL/hr at 05/26/19 2325     LOS: 2 days   Rickey Barbara, MD [facility] Pager On Amion  If 7PM-7AM,  please contact night-coverage 05/27/2019, 3:50 PM

## 2019-05-27 NOTE — TOC Progression Note (Signed)
Transition of Care [facility]) - Progression Note    Patient Details  Name: Stacie Wright MRN: [id number] Date of Birth: Dec 06, 1927  Transition of Care [facility] CM/SW Contact  Yacine Droz, Juliann Pulse, RN Phone Number: 05/27/2019, 2:09 PM  Clinical Narrative:HTA auth received for PTAR transport back to facility-[facility]-auth #[id number].            Expected Discharge Plan and Services                                                 Social Determinants of Health (SDOH) Interventions    Readmission Risk Interventions No flowsheet data found.

## 2019-05-27 NOTE — NC FL2 (Signed)
[place] LEVEL OF CARE SCREENING TOOL     IDENTIFICATION  Patient Name: Stacie Wright Birthdate: 12-06-1927 Sex: female Admission Date (Current Location): 05/22/2019  [facility] and [place] Number:  Herbalist and Address:         Provider Number: [id number]  Attending Physician Name and Address:  Donne Hazel, MD  Relative Name and Phone Number:  Aalliyah Kilker [phone]    Current Level of Care: Hospital Recommended Level of Care: [place] Prior Approval Number:    Date Approved/Denied:   PASRR Number: [id number]  Discharge Plan: SNF    Current Diagnoses: Patient Active Problem List   Diagnosis Date Noted  . Hematochezia   . GI bleed 05/22/2019  . Severe protein-calorie malnutrition ([place])   . Acute encephalopathy 11/28/2016  . Pressure injury of skin 11/28/2016  . Coumadin toxicity, accidental or unintentional, initial encounter   . Acute lower UTI 11/27/2016  . Agitation 11/27/2016  . Hypokalemia 11/27/2016  . Macrocytic anemia 11/27/2016  . Supratherapeutic INR 11/27/2016  . Hx of gout 11/20/2016  . Recurrent deep vein thrombosis (DVT) ([place]) 11/18/2016  . HCAP (healthcare-associated pneumonia)   . Respiratory failure ([place]) 11/10/2016  . Pneumonia of right lung due to infectious organism   . Lactic acidosis   . Sepsis ([place])   . Obstructive bronchiectasis ([place]) 11/01/2015  . Bronchiectasis ([place]) 10/11/2015  . Acute respiratory failure with hypoxia ([place]) 10/11/2015  . Acute exacerbation of bronchiectasis ([place]) 10/11/2015  . Mold exposure 06/20/2015  . Generalized anxiety disorder 01/19/2015  . Depression 01/19/2015  . Dementia in Alzheimer's disease ([place]) 01/19/2015  . Mycobacterium avium-intracellulare infection ([place]) 01/18/2015  . Influenza with pneumonia 01/15/2015  . Cellulitis 06/09/2013  . Infected hardware in left leg ([place]) 06/09/2013  . Constipation 04/27/2013  . Pneumonia, organism unspecified(486)  04/26/2013  . COPD exacerbation ([place]) 04/26/2013  . Bronchiectasis without acute exacerbation ([place]) 04/26/2013  . Pulmonary nodule 04/26/2013  . Acute bronchitis 04/25/2013  . Generalized weakness 04/25/2013  . Chronic respiratory failure with hypoxia ([place]) 04/25/2013  . Hypertension     Orientation RESPIRATION BLADDER Height & Weight     Self  Normal Incontinent Weight: 76.7 kg Height:  5\' 6"  (167.6 cm)  BEHAVIORAL SYMPTOMS/MOOD NEUROLOGICAL BOWEL NUTRITION STATUS      Incontinent Diet(full liquid)  AMBULATORY STATUS COMMUNICATION OF NEEDS Skin   Total Care Verbally Skin abrasions, Other (Comment)(Sacral wound-foam)                       Personal Care Assistance Level of Assistance  Bathing, Feeding, Dressing Bathing Assistance: Maximum assistance Feeding assistance: Maximum assistance Dressing Assistance: Maximum assistance     Functional Limitations Info  Sight, Hearing Sight Info: Adequate Hearing Info: Adequate      SPECIAL CARE FACTORS FREQUENCY                       Contractures Contractures Info: Not present    Additional Factors Info  Code Status, Allergies Code Status Info: DNR Allergies Info: Amoxicillin, Darvocet Propoxyphene N-acetaminophen, Penicillins, Sulfa Antibiotics, Propoxyphene           Current Medications (05/27/2019):  This is the current hospital active medication list Current Facility-Administered Medications  Medication Dose Route Frequency Provider Last Rate Last Dose  . 0.45 % sodium chloride infusion   Intravenous Continuous Wendee Beavers T, MD 50 mL/hr at 05/26/19 2325    . acetaminophen (TYLENOL)  tablet 650 mg  650 mg Oral Q6H PRN Almon HerculesGonfa, Taye T, MD       Or  . acetaminophen (TYLENOL) suppository 650 mg  650 mg Rectal Q6H PRN Gonfa, Taye T, MD      . albuterol (PROVENTIL) (2.5 MG/3ML) 0.083% nebulizer solution 2.5 mg  2.5 mg Nebulization Q2H PRN Candelaria StagersGonfa, Taye T, MD      . ALPRAZolam Prudy Feeler(XANAX) tablet 0.5 mg  0.5 mg Oral BID  Candelaria StagersGonfa, Taye T, MD   0.5 mg at 05/26/19 1331  . bisacodyl (DULCOLAX) suppository 10 mg  10 mg Rectal Daily PRN Candelaria StagersGonfa, Taye T, MD      . divalproex (DEPAKOTE SPRINKLE) capsule 375 mg  375 mg Oral TID Candelaria StagersGonfa, Taye T, MD   375 mg at 05/27/19 1219  . docusate sodium (COLACE) capsule 100 mg  100 mg Oral BID Candelaria StagersGonfa, Taye T, MD   100 mg at 05/27/19 1218  . ethambutol (MYAMBUTOL) tablet 1,600 mg  1,600 mg Oral 3 times weekly Candelaria StagersGonfa, Taye T, MD   1,600 mg at 05/27/19 1221  . loratadine (CLARITIN) tablet 10 mg  10 mg Oral Daily Candelaria StagersGonfa, Taye T, MD   10 mg at 05/27/19 1218  . mirtazapine (REMERON) tablet 3.75 mg  3.75 mg Oral QHS Candelaria StagersGonfa, Taye T, MD   3.75 mg at 05/25/19 2314  . OLANZapine zydis (ZYPREXA) disintegrating tablet 7.5 mg  7.5 mg Oral QHS Candelaria StagersGonfa, Taye T, MD   7.5 mg at 05/25/19 2316  . ondansetron (ZOFRAN) tablet 4 mg  4 mg Oral Q6H PRN Gonfa, Taye T, MD       Or  . ondansetron (ZOFRAN) injection 4 mg  4 mg Intravenous Q6H PRN Gonfa, Taye T, MD      . pantoprazole (PROTONIX) injection 40 mg  40 mg Intravenous Q12H Candelaria StagersGonfa, Taye T, MD   40 mg at 05/27/19 1218  . PARoxetine (PAXIL) tablet 10 mg  10 mg Oral Daily Candelaria StagersGonfa, Taye T, MD   10 mg at 05/27/19 1220  . rifampin (RIFADIN) capsule 600 mg  600 mg Oral 3 times weekly Candelaria StagersGonfa, Taye T, MD   600 mg at 05/27/19 1219  . senna-docusate (Senokot-S) tablet 1 tablet  1 tablet Oral QHS PRN Gonfa, Taye T, MD      . sodium phosphate (FLEET) 7-19 GM/118ML enema 1 enema  1 enema Rectal Once PRN Almon HerculesGonfa, Taye T, MD         Discharge Medications: Please see discharge summary for a list of discharge medications.  Relevant Imaging Results:  Relevant Lab Results:   Additional Information [id number]  Tritia Endo, Olegario MessierKathy, [place]RN

## 2019-05-27 NOTE — Progress Notes (Signed)
Bilateral lower extremity venous duplex completed. Preliminary results in Chart review CV Proc. [place] Hollynn Garno,RVS 05/27/2019, 1:59 PM

## 2019-05-27 NOTE — Progress Notes (Signed)
ANTICOAGULATION CONSULT NOTE - Follow Up Consult  Pharmacy Consult for warfarin with heparin bridge Indication: VTE treatment  Allergies  Allergen Reactions  . Amoxicillin Anaphylaxis and Rash  . Darvocet [Propoxyphene N-Acetaminophen] Other (See Comments)    hallucinations  . Penicillins Anaphylaxis and Rash    Has patient had a PCN reaction causing immediate rash, facial/tongue/throat swelling, SOB or lightheadedness with hypotension: Yes Has patient had a PCN reaction causing severe rash involving mucus membranes or skin necrosis: No Has patient had a PCN reaction that required hospitalization No Has patient had a PCN reaction occurring within the last 10 years: No If all of the above answers are "NO", then may proceed with Cephalosporin use.   . Sulfa Antibiotics Anaphylaxis and Rash  . Propoxyphene Other (See Comments)    hallucinations    Patient Measurements: Height: 5\' 6"  (167.6 cm) Weight: 169 lb 1.5 oz (76.7 kg) IBW/kg (Calculated) : 59.3 Heparin Dosing Weight: 75 kg  Vital Signs: Temp: 98.4 F (36.9 C) (09/11 1424) Temp Source: Oral (09/11 1424) BP: 148/63 (09/11 1424) Pulse Rate: 77 (09/11 1424)  Labs: Recent Labs    05/25/19 0408 05/26/19 0502 05/27/19 0457 05/27/19 1515  HGB 10.2* 10.3*  --   --   HCT 32.8* 32.2*  --   --   PLT 148* 143*  --   --   LABPROT 29.1*  --   --  17.6*  INR 2.8*  --   --  1.5*  CREATININE  --  0.63 0.68  --     Estimated Creatinine Clearance: 49.9 mL/min (by C-G formula based on SCr of 0.68 mg/dL).  Assessment: Pharmacy consulted to dose warfarin and heparin bridge in this 85 year old female for VTE treatment.   Patient has PMH of alzheimer's dementia, HTN, MAI infection, normocytic anemia, and history of LLE DVT for which she was taking warfarin PTA. Pt admitted to hospital on 9/6 with rectal bleeding and supratherapeutic INR of 4.1. LD of warfarin was 9/5 PTA.  Anticoagulation has been held throughout admission, but is  being resumed today. Venous dopplar (9/11) shows chronic DVT in left leg. Per GI note recommendation (9/8), will target lower end of therapeutic range for INR. Per consult, ok to resume anticoagulation per GI.   Patient is on rifampin 600 mg PO three times a week - medication has significant drug interaction with warfarin, but patient was prescribed both PTA.  _________________________________________________  Warfarin dose PTA: warfarin 7.5 mg PO daily INR: 4.1 on admission  Significant Events: -9/6 - 9/10: Warfarin held -9/11: Venous dopplar showing chronic DVT in left leg. Anticoagulation resumed  Today, 05/27/19  Hgb: 10.3 (9/10) - low but stable x 2 days  Plt:143 (9/10) - low but stable  STAT CBC and aPTT ordered  INR = 1.5 is subtherapeutic as expected as warfarin has been held for 5 days  Patient on full liquid diet, 50% of meal charted today  Drug interactions: rifampin is is an inducer and may decrease serum concentrations of vitamin K antagonists. Patient was on both medications PTA.   Goal of Therapy:  INR 2-3 (target 2-2.5) Heparin level 0.3-0.7 units/ml Monitor platelets by anticoagulation protocol: Yes   Plan:   Will start heparin drip with no bolus given recent GIB and chronic (not acute) VTE  Initiate heparin infusion of 900 units/hr (12 units/kg/hr)  Warfarin 5 mg PO once this evening  Check HL in 8 hours  INR, HL, and CBC daily  Monitor closely for any  signs/symptoms of bleeding  Lenis Noon, PharmD 05/27/2019,4:34 PM

## 2019-05-27 NOTE — Care Management Important Message (Signed)
Important Message  Patient Details IM Letter given to Dessa Phi RN to present to the Patient Name: Stacie Wright MRN: [id number] Date of Birth: 1927-12-08   Medicare Important Message Given:  Yes     Kerin Salen 05/27/2019, 11:23 AM

## 2019-05-27 NOTE — TOC Progression Note (Signed)
Transition of Care [facility]) - Progression Note    Patient Details  Name: Stacie Wright MRN: [id number] Date of Birth: 19-Mar-1928  Transition of Care [facility]) CM/SW Contact  Quinton Voth, Juliann Pulse, RN Phone Number: 05/27/2019, 1:54 PM  Clinical Narrative: Tawni Carnes, & [facility].Also completed [facility] transport prior auth form for [employer].Spoke to [employer] of prior auth need for PTAR non emergency service-he is aware of co pay once transport bills.           Expected Discharge Plan and Services                                                 Social Determinants of Health (SDOH) Interventions    Readmission Risk Interventions No flowsheet data found.

## 2019-05-28 LAB — BASIC METABOLIC PANEL
Anion gap: 7 (ref 5–15)
BUN: 6 mg/dL — ABNORMAL LOW (ref 8–23)
CO2: 31 mmol/L (ref 22–32)
Calcium: 8.1 mg/dL — ABNORMAL LOW (ref 8.9–10.3)
Chloride: 104 mmol/L (ref 98–111)
Creatinine, Ser: 0.63 mg/dL (ref 0.44–1.00)
GFR calc Af Amer: >60 mL/min (ref >60)
GFR calc non Af Amer: >60 mL/min (ref >60)
Glucose, Bld: 99 mg/dL (ref 70–99)
Potassium: 3.6 mmol/L (ref 3.5–5.1)
Sodium: 142 mmol/L (ref 135–145)

## 2019-05-28 LAB — CBC
HCT: 31.1 % — ABNORMAL LOW (ref 36.0–46.0)
Hemoglobin: 9.7 g/dL — ABNORMAL LOW (ref 12.0–15.0)
MCH: 32.2 pg (ref 26.0–34.0)
MCHC: 31.2 g/dL (ref 30.0–36.0)
MCV: 103.3 fL — ABNORMAL HIGH (ref 80.0–100.0)
Platelets: 119 10*3/uL — ABNORMAL LOW (ref 150–400)
RBC: 3.01 MIL/uL — ABNORMAL LOW (ref 3.87–5.11)
RDW: 12.7 % (ref 11.5–15.5)
WBC: 4.2 10*3/uL (ref 4.0–10.5)
nRBC: 0 % (ref 0.0–0.2)

## 2019-05-28 LAB — HEPARIN LEVEL (UNFRACTIONATED)
Heparin Unfractionated: 0.42 IU/mL (ref 0.30–0.70)
Heparin Unfractionated: 0.62 IU/mL (ref 0.30–0.70)
Heparin Unfractionated: 0.57 IU/mL (ref 0.30–0.70)

## 2019-05-28 LAB — PROTIME-INR
INR: 1.6 — ABNORMAL HIGH (ref 0.8–1.2)
Prothrombin Time: 18.6 seconds — ABNORMAL HIGH (ref 11.4–15.2)

## 2019-05-28 MED ORDER — INFLUENZA VAC A&B SA ADJ QUAD 0.5 ML IM PRSY
0.5000 mL | PREFILLED_SYRINGE | INTRAMUSCULAR | Status: DC
  Filled 2019-05-28: qty 0.5

## 2019-05-28 MED ORDER — HEPARIN (PORCINE) 25000 UT/250ML-% IV SOLN
700.0000 [IU]/h | INTRAVENOUS | Status: DC
  Administered 2019-05-28: 800 [IU]/h via INTRAVENOUS
  Administered 2019-05-28: 900 [IU]/h via INTRAVENOUS
  Filled 2019-05-28: qty 250

## 2019-05-28 MED ORDER — WARFARIN SODIUM 4 MG PO TABS
4.0000 mg | ORAL_TABLET | Freq: Once | ORAL | Status: AC
  Administered 2019-05-28: 4 mg via ORAL
  Filled 2019-05-28: qty 1

## 2019-05-28 NOTE — Progress Notes (Signed)
ANTICOAGULATION CONSULT NOTE - Follow Up Consult  Pharmacy Consult for Heparin Indication: VTE Treatment  Allergies  Allergen Reactions  . Amoxicillin Anaphylaxis and Rash  . Darvocet [Propoxyphene N-Acetaminophen] Other (See Comments)    hallucinations  . Penicillins Anaphylaxis and Rash    Has patient had a PCN reaction causing immediate rash, facial/tongue/throat swelling, SOB or lightheadedness with hypotension: Yes Has patient had a PCN reaction causing severe rash involving mucus membranes or skin necrosis: No Has patient had a PCN reaction that required hospitalization No Has patient had a PCN reaction occurring within the last 10 years: No If all of the above answers are "NO", then may proceed with Cephalosporin use.   . Sulfa Antibiotics Anaphylaxis and Rash  . Propoxyphene Other (See Comments)    hallucinations    Patient Measurements: Height: 5\' 6"  (167.6 cm) Weight: 169 lb 1.5 oz (76.7 kg) IBW/kg (Calculated) : 59.3 Heparin Dosing Weight:   Vital Signs: Temp: 97.6 F (36.4 C) (09/12 1433) Temp Source: Oral (09/12 1433) BP: 148/61 (09/12 1433) Pulse Rate: 70 (09/12 1433)  Labs: Recent Labs    05/26/19 0502 05/27/19 0457 05/27/19 1515 05/27/19 1712 05/28/19 0113 05/28/19 1041 05/28/19 2048  HGB 10.3*  --   --  10.2* 9.7*  --   --   HCT 32.2*  --   --  33.4* 31.1*  --   --   PLT 143*  --   --  144* 119*  --   --   APTT  --   --   --  28  --   --   --   LABPROT  --   --  17.6*  --  18.6*  --   --   INR  --   --  1.5*  --  1.6*  --   --   HEPARINUNFRC  --   --   --   --  0.42 0.62 0.57  CREATININE 0.63 0.68  --   --  0.63  --   --     Estimated Creatinine Clearance: 49.9 mL/min (by C-G formula based on SCr of 0.63 mg/dL).   Medications:  Infusions:  . sodium chloride 50 mL/hr at 05/28/19 1540  . heparin 800 Units/hr (05/28/19 1546)    Assessment: Patient with heparin at goal but above preferred range of 0.3-0.5.  No heparin issues per  RN.  Goal of Therapy:  Heparin level 0.3-0.7 units/ml  (aim for 0.3-0.5) Monitor platelets by anticoagulation protocol: Yes     Plan:  Decrease heparin to 700 units/hr Recheck level at 0800  Nani Skillern Crowford 05/28/2019,10:21 PM

## 2019-05-28 NOTE — Progress Notes (Signed)
PROGRESS NOTE    Stacie Wright  PJK:[id number] DOB: 1927/10/07 DOA: 05/22/2019 PCP: Benita Stabile, MD    Brief Narrative:  85 year old female with a history of Alzheimer's dementia, anxiety, depression, COPD, left lower extremity DVT on warfarin, hypertension not on medications, MAI infection, normocytic anemia brought to ED from skilled nursing facility due to rectal bleeding.  Patient has advanced dementia so unable to provide any history.  No family member could be contacted by admitting physician.  Assessment & Plan:   Active Problems:   Depression   Pressure injury of skin   GI bleed   Hematochezia  1. Rectal bleeding-FOBT positive. GI was consulted, no plans for endoscopy.  If she rebleeds consider, CT abdomen pelvis. Pt has been continued on coumadin. Hgb relatively stable. Will repeat CBC in AM  2. Dysphagia-concern for dysphagia per nursing staff.  Pt was earlier seen by SLP with recommendation for full liquid diet. Currently stable at this time  3. Anemia of chronic disease-likely chronic blood loss anemia, FOBT positive.  MCV is 109.6.  Hemoglobin thus far remains stable. Hgb currently 9.7, labs reviewed. Relatively stable. Repeat cbc in AM  4. Chronic Left lower extremity DVT-Pt presented with supratherapeutic INR.  Warfarin was on hold. Repeat LE dopplers, reviewed with continued LLE DVT. Family had agreed with continuing anticoagulation. Hgb stable relatively. Will repeat CBC in AM. Continue coumadin per pharmacy. Current INR1.6  5. Advanced dementia-patient has Alzheimer's dementia. Mentation remains stable this AM  6. Chronic COPD-no acute respiratory issue this AM, on minimal O2 support.  Pt is continued on PRN duo nebs. No audible wheezing at this time  7. Myocardium avium intracellulare infection-continue home ethambutol and rifampin reportedly on this since 12/20/2018. Stable at present  8. Anxiety/depression-continue home Xanax, Depakote, Zyprexa, Paxil,  Remeron, melatonin. Patient remains stable  DVT prophylaxis: SCD's Code Status: DNR Family Communication: Pt in room, family not at bedside Disposition Plan: Uncertain at this time  Consultants:   GI  Procedures:     Antimicrobials: Anti-infectives (From admission, onward)   Start     Dose/Rate Route Frequency Ordered Stop   05/23/19 0900  ethambutol (MYAMBUTOL) tablet 1,600 mg    Note to Pharmacy: Monday, Wednesday, & Friday     1,600 mg Oral 3 times weekly 05/22/19 1829     05/23/19 0900  rifampin (RIFADIN) capsule 600 mg    Note to Pharmacy: Monday, Wednesday, & Friday     600 mg Oral 3 times weekly 05/22/19 1829        Subjective: Without complaints this AM  Objective: Vitals:   05/27/19 1424 05/27/19 2130 05/28/19 0637 05/28/19 1433  BP: (!) 148/63 132/70 (!) 155/85 (!) 148/61  Pulse: 77 75 73 70  Resp: 16 16 16 15   Temp: 98.4 F (36.9 C) 98.3 F (36.8 C) 97.6 F (36.4 C) 97.6 F (36.4 C)  TempSrc: Oral Oral Oral Oral  SpO2: 97% 98% 100% 100%  Weight:      Height:        Intake/Output Summary (Last 24 hours) at 05/28/2019 1511 Last data filed at 05/28/2019 1436 Gross per 24 hour  Intake 1616.41 ml  Output 1700 ml  Net -83.59 ml   Filed Weights   05/22/19 1812  Weight: 76.7 kg    Examination: General exam: Conversant, in no acute distress Respiratory system: normal chest rise, clear, no audible wheezing Cardiovascular system: regular rhythm, s1-s2 Gastrointestinal system: Nondistended, nontender, pos BS Central nervous system: No seizures, no  tremors Extremities: No cyanosis, no joint deformities Skin: No rashes, no pallor Psychiatry: difficult to assess given current level of baseline mentation  Data Reviewed: I have personally reviewed following labs and imaging studies  CBC: Recent Labs  Lab 05/22/19 1429  05/24/19 0425 05/25/19 0408 05/26/19 0502 05/27/19 1712 05/28/19 0113  WBC 5.7   < > 5.2 5.0 4.6 5.1 4.2  NEUTROABS 4.0  --    --   --   --   --   --   HGB 10.5*   < > 13.2 10.2* 10.3* 10.2* 9.7*  HCT 34.6*   < > 41.3 32.8* 32.2* 33.4* 31.1*  MCV 105.5*   < > 103.0* 103.1* 102.5* 106.4* 103.3*  PLT 186   < > 120* 148* 143* 144* 119*   < > = values in this interval not displayed.   Basic Metabolic Panel: Recent Labs  Lab 05/23/19 0539 05/24/19 0425 05/26/19 0502 05/27/19 0457 05/28/19 0113  NA 143 140 143 145 142  K 3.6 3.9 3.2* 4.1 3.6  CL 103 104 104 106 104  CO2 28 30 26 29 31   GLUCOSE 78 77 85 95 99  BUN 21 14 11 8  6*  CREATININE 0.68 0.63 0.63 0.68 0.63  CALCIUM 8.8* 8.5* 7.8* 8.3* 8.1*   GFR: Estimated Creatinine Clearance: 49.9 mL/min (by C-G formula based on SCr of 0.63 mg/dL). Liver Function Tests: Recent Labs  Lab 05/22/19 1429  AST 17  ALT 16  ALKPHOS 51  BILITOT 0.3  PROT 6.8  ALBUMIN 3.2*   No results for input(s): LIPASE, AMYLASE in the last 168 hours. No results for input(s): AMMONIA in the last 168 hours. Coagulation Profile: Recent Labs  Lab 05/22/19 1429 05/23/19 0856 05/25/19 0408 05/27/19 1515 05/28/19 0113  INR 4.1* 3.3* 2.8* 1.5* 1.6*   Cardiac Enzymes: No results for input(s): CKTOTAL, CKMB, CKMBINDEX, TROPONINI in the last 168 hours. BNP (last 3 results) No results for input(s): PROBNP in the last 8760 hours. HbA1C: No results for input(s): HGBA1C in the last 72 hours. CBG: No results for input(s): GLUCAP in the last 168 hours. Lipid Profile: No results for input(s): CHOL, HDL, LDLCALC, TRIG, CHOLHDL, LDLDIRECT in the last 72 hours. Thyroid Function Tests: No results for input(s): TSH, T4TOTAL, FREET4, T3FREE, THYROIDAB in the last 72 hours. Anemia Panel: No results for input(s): VITAMINB12, FOLATE, FERRITIN, TIBC, IRON, RETICCTPCT in the last 72 hours. Sepsis Labs: No results for input(s): PROCALCITON, LATICACIDVEN in the last 168 hours.  Recent Results (from the past 240 hour(s))  SARS CORONAVIRUS 2 (TAT 6-24 HRS) Nasopharyngeal Nasopharyngeal Swab      Status: None   Collection Time: 05/22/19  4:06 PM   Specimen: Nasopharyngeal Swab  Result Value Ref Range Status   SARS Coronavirus 2 NEGATIVE NEGATIVE Final    Comment: (NOTE) SARS-CoV-2 target nucleic acids are NOT DETECTED. The SARS-CoV-2 RNA is generally detectable in upper and lower respiratory specimens during the acute phase of infection. Negative results do not preclude SARS-CoV-2 infection, do not rule out co-infections with other pathogens, and should not be used as the sole basis for treatment or other patient management decisions. Negative results must be combined with clinical observations, patient history, and epidemiological information. The expected result is Negative. Fact Sheet for Patients: HairSlick.nohttps://www.fda.gov/media/138098/download Fact Sheet for Healthcare Providers: quierodirigir.comhttps://www.fda.gov/media/138095/download This test is not yet approved or cleared by the [place] FDA and  has been authorized for detection and/or diagnosis of SARS-CoV-2 by FDA under an Emergency Use  Authorization (EUA). This EUA will remain  in effect (meaning this test can be used) for the duration of the COVID-19 declaration under Section 56 4(b)(1) of the Act, 21 U.S.C. section 360bbb-3(b)(1), unless the authorization is terminated or revoked sooner. Performed at [facility], [address] [place], [place] [address]   MRSA PCR Screening     Status: None   Collection Time: 05/22/19  6:18 PM   Specimen: Nasal Mucosa; Nasopharyngeal  Result Value Ref Range Status   MRSA by PCR NEGATIVE NEGATIVE Final    Comment:        The GeneXpert MRSA Assay (FDA approved for NASAL specimens only), is one component of a comprehensive MRSA colonization surveillance program. It is not intended to diagnose MRSA infection nor to guide or monitor treatment for MRSA infections. Performed at [facility], [address] [place], [place] [address]      Radiology  Studies: Vas Koreas Lower Extremity Venous (dvt)  Result Date: 05/28/2019  Lower Venous Study Indications: F/U from DVT of 2008 to discontinue blood thinners.  Risk Factors: DVT Lt le 2008. Limitations: Patient unable to move legs. Comparison Study: 2008 Positive lt junction of fv and profunda occluded,                   popliteal occlusion, ptv occlusion. Inconplete occlusion of                   GSV Performing Technologist: [employer]ia Slaughter RVS  Examination Guidelines: A complete evaluation includes B-mode imaging, spectral Doppler, color Doppler, and power Doppler as needed of all accessible portions of each vessel. Bilateral testing is considered an integral part of a complete examination. Limited examinations for reoccurring indications may be performed as noted.  +---------+---------------+---------+-----------+----------+-------------------+  RIGHT     Compressibility Phasicity Spontaneity Properties Thrombus Aging       +---------+---------------+---------+-----------+----------+-------------------+  CFV       Full            Yes       Yes                                         +---------+---------------+---------+-----------+----------+-------------------+  SFJ       Full                                                                  +---------+---------------+---------+-----------+----------+-------------------+  FV Prox   Full            Yes       Yes                                         +---------+---------------+---------+-----------+----------+-------------------+  FV Mid    Full                                                                  +---------+---------------+---------+-----------+----------+-------------------+  FV Distal Full            Yes       Yes                                         +---------+---------------+---------+-----------+----------+-------------------+  PFV       Full            Yes       Yes                                          +---------+---------------+---------+-----------+----------+-------------------+  POP                       Yes                              Unable to compress                                                               due to position of                                                               the leg              +---------+---------------+---------+-----------+----------+-------------------+  PTV       Full                      Yes                                         +---------+---------------+---------+-----------+----------+-------------------+  PERO                                                       Not visualized       +---------+---------------+---------+-----------+----------+-------------------+   Right Technical Findings: Technically difficult and limited due to patient mobility  +---------+---------------+---------+-----------+----------+-------------------+  LEFT      Compressibility Phasicity Spontaneity Properties Thrombus Aging       +---------+---------------+---------+-----------+----------+-------------------+  CFV       Partial         Yes       Yes                    Chronic              +---------+---------------+---------+-----------+----------+-------------------+  SFJ       Partial  Chronic              +---------+---------------+---------+-----------+----------+-------------------+  FV Prox   Full            Yes       Yes                    Narrowed             +---------+---------------+---------+-----------+----------+-------------------+  FV Mid    Full                                                                  +---------+---------------+---------+-----------+----------+-------------------+  FV Distal Full            Yes       Yes                    Narrowed             +---------+---------------+---------+-----------+----------+-------------------+  PFV       Full                                                                   +---------+---------------+---------+-----------+----------+-------------------+  POP                                                        Unable to visualize                                                              due to patient                                                                   immobility           +---------+---------------+---------+-----------+----------+-------------------+  PTV       Full                                                                  +---------+---------------+---------+-----------+----------+-------------------+  PERO  Unable to visualize                                                              due to immobility    +---------+---------------+---------+-----------+----------+-------------------+   Left Technical Findings: Technically difficult and limited due to immobility of the patient.   Summary: Right: There is no evidence of deep vein thrombosis in the lower extremity. However, portions of this examination were limited- see technologist comments above. No cystic structure found in the popliteal fossa. See technical findings listed above Left: Findings consistent with chronic deep vein thrombosis involving the left common femoral vein. Findings consistent with chronic superficial vein thrombosis involving the left great saphenous vein. There is no evidence of acute deep vein thrombosis in the lower extremity. However, portions of this examination were limited- see technologist comments above. Unable to evaluated for cystic structure. See technical findings listed above  *See table(s) above for measurements and observations. Electronically signed by Lemar Livings MD on 05/28/2019 at 12:15:05 AM.    Final     Scheduled Meds:  ALPRAZolam  0.5 mg Oral BID   divalproex  375 mg Oral TID   docusate sodium  100 mg Oral BID   ethambutol  1,600 mg Oral 3 times weekly   loratadine  10 mg Oral Daily    mirtazapine  3.75 mg Oral QHS   OLANZapine zydis  7.5 mg Oral QHS   pantoprazole (PROTONIX) IV  40 mg Intravenous Q12H   PARoxetine  10 mg Oral Daily   rifampin  600 mg Oral 3 times weekly   warfarin  4 mg Oral ONCE-1800   Warfarin - Pharmacist Dosing Inpatient   Does not apply q1800   Continuous Infusions:  sodium chloride 50 mL/hr at 05/27/19 1853   heparin 800 Units/hr (05/28/19 1208)     LOS: 3 days   Rickey Barbara, MD [facility] Pager On Amion  If 7PM-7AM, please contact night-coverage 05/28/2019, 3:11 PM

## 2019-05-28 NOTE — Progress Notes (Signed)
ANTICOAGULATION CONSULT NOTE - Follow Up Consult  Pharmacy Consult for Heparin Indication: VTE Treatment  Allergies  Allergen Reactions  . Amoxicillin Anaphylaxis and Rash  . Darvocet [Propoxyphene N-Acetaminophen] Other (See Comments)    hallucinations  . Penicillins Anaphylaxis and Rash    Has patient had a PCN reaction causing immediate rash, facial/tongue/throat swelling, SOB or lightheadedness with hypotension: Yes Has patient had a PCN reaction causing severe rash involving mucus membranes or skin necrosis: No Has patient had a PCN reaction that required hospitalization No Has patient had a PCN reaction occurring within the last 10 years: No If all of the above answers are "NO", then may proceed with Cephalosporin use.   . Sulfa Antibiotics Anaphylaxis and Rash  . Propoxyphene Other (See Comments)    hallucinations    Patient Measurements: Height: 5\' 6"  (167.6 cm) Weight: 169 lb 1.5 oz (76.7 kg) IBW/kg (Calculated) : 59.3 Heparin Dosing Weight:   Vital Signs: Temp: 98.3 F (36.8 C) (09/11 2130) Temp Source: Oral (09/11 2130) BP: 132/70 (09/11 2130) Pulse Rate: 75 (09/11 2130)  Labs: Recent Labs    05/26/19 0502 05/27/19 0457 05/27/19 1515 05/27/19 1712 05/28/19 0113  HGB 10.3*  --   --  10.2* 9.7*  HCT 32.2*  --   --  33.4* 31.1*  PLT 143*  --   --  144* 119*  APTT  --   --   --  28  --   LABPROT  --   --  17.6*  --  18.6*  INR  --   --  1.5*  --  1.6*  HEPARINUNFRC  --   --   --   --  0.42  CREATININE 0.63 0.68  --   --  0.63    Estimated Creatinine Clearance: 49.9 mL/min (by C-G formula based on SCr of 0.63 mg/dL).   Medications:  Infusions:  . sodium chloride 50 mL/hr at 05/27/19 1853  . heparin 900 Units/hr (05/27/19 1740)    Assessment: Patient with heparin level at goal.  No heparin issues noted.    Goal of Therapy:  Heparin level 0.3-0.7 units/ml  (aim for 0.3-0.5) Monitor platelets by anticoagulation protocol: Yes   Plan:  Continue  heparin drip at current rate Recheck level at [place], K[place]um Crowford 05/28/2019,5:42 AM

## 2019-05-28 NOTE — Progress Notes (Signed)
ANTICOAGULATION CONSULT NOTE - Follow Up Consult  Pharmacy Consult for warfarin with heparin bridge Indication: VTE treatment  Allergies  Allergen Reactions  . Amoxicillin Anaphylaxis and Rash  . Darvocet [Propoxyphene N-Acetaminophen] Other (See Comments)    hallucinations  . Penicillins Anaphylaxis and Rash    Has patient had a PCN reaction causing immediate rash, facial/tongue/throat swelling, SOB or lightheadedness with hypotension: Yes Has patient had a PCN reaction causing severe rash involving mucus membranes or skin necrosis: No Has patient had a PCN reaction that required hospitalization No Has patient had a PCN reaction occurring within the last 10 years: No If all of the above answers are "NO", then may proceed with Cephalosporin use.   . Sulfa Antibiotics Anaphylaxis and Rash  . Propoxyphene Other (See Comments)    hallucinations    Patient Measurements: Height: 5\' 6"  (167.6 cm) Weight: 169 lb 1.5 oz (76.7 kg) IBW/kg (Calculated) : 59.3 Heparin Dosing Weight: 75 kg  Vital Signs: Temp: 97.6 F (36.4 C) (09/12 0637) Temp Source: Oral (09/12 0637) BP: 155/85 (09/12 02720637) Pulse Rate: 73 (09/12 0637)  Labs: Recent Labs    05/26/19 0502 05/27/19 0457 05/27/19 1515 05/27/19 1712 05/28/19 0113 05/28/19 1041  HGB 10.3*  --   --  10.2* 9.7*  --   HCT 32.2*  --   --  33.4* 31.1*  --   PLT 143*  --   --  144* 119*  --   APTT  --   --   --  28  --   --   LABPROT  --   --  17.6*  --  18.6*  --   INR  --   --  1.5*  --  1.6*  --   HEPARINUNFRC  --   --   --   --  0.42 0.62  CREATININE 0.63 0.68  --   --  0.63  --     Estimated Creatinine Clearance: 49.9 mL/min (by C-G formula based on SCr of 0.63 mg/dL).  Assessment: Pharmacy consulted to dose warfarin and heparin bridge in this 85 year old female for VTE treatment.   Patient has PMH of alzheimer's dementia, HTN, MAI infection, normocytic anemia, and history of LLE DVT for which she was taking warfarin PTA. Pt  admitted to hospital on 9/6 with rectal bleeding and supratherapeutic INR of 4.1. LD of warfarin was 9/5 PTA.  Anticoagulation has been held throughout admission, but is being resumed today. Venous dopplar (9/11) shows chronic DVT in left leg. Per GI note recommendation (9/8), will target lower end of therapeutic range for INR. Per consult, ok to resume anticoagulation per GI.   Patient is on rifampin 600 mg PO three times a week - medication has significant drug interaction with warfarin, but patient was prescribed both PTA.  _________________________________________________  Warfarin dose PTA: warfarin 7.5 mg PO daily INR: 4.1 on admission  Significant Events: -9/6 - 9/10: Warfarin held -9/11: Venous dopplar showing chronic DVT in left leg. Anticoagulation resumed  Today, 05/28/19  Hgb: 9.7 - low but stable  Plt: 119 - low but has been in 120-140 range during admission  INR = 1.6 remains subtherapeutic as expected as warfarin was held for 5 days  HL = 0.62 is therapeutic, but increasing on heparin infusion of 900 units/hr.   Confirmed with RN that heparin infusing at correct rate with no issues. No signs/symptoms bleeding or bruising.  Patient on full liquid diet, 25 % of meal charted   Drug  interactions: rifampin is is an inducer and may decrease serum concentrations of vitamin K antagonists. Of note, patient was on both medications PTA.   Goal of Therapy:  INR 2-3 (target 2-2.5) Heparin level 0.3-0.7 units/ml Monitor platelets by anticoagulation protocol: Yes   Plan:   Will decrease heparin infusion to 800 units/hr since HL rising  No heparin boluses given recent GIB  Warfarin 4 mg PO once this evening  Check HL in 8 hours  INR, HL, and CBC daily  Monitor closely for any signs/symptoms of bleeding  Patient will need to discharge on a lower dose of warfarin than she was previously taking. Follow along for disposition plan.  Lenis Noon, PharmD 05/28/2019,11:33  AM

## 2019-05-29 LAB — CBC
HCT: 30.8 % — ABNORMAL LOW (ref 36.0–46.0)
Hemoglobin: 9.7 g/dL — ABNORMAL LOW (ref 12.0–15.0)
MCH: 32.7 pg (ref 26.0–34.0)
MCHC: 31.5 g/dL (ref 30.0–36.0)
MCV: 103.7 fL — ABNORMAL HIGH (ref 80.0–100.0)
Platelets: 131 10*3/uL — ABNORMAL LOW (ref 150–400)
RBC: 2.97 MIL/uL — ABNORMAL LOW (ref 3.87–5.11)
RDW: 12.7 % (ref 11.5–15.5)
WBC: 4.2 10*3/uL (ref 4.0–10.5)
nRBC: 0 % (ref 0.0–0.2)

## 2019-05-29 LAB — PROTIME-INR
INR: 2.2 — ABNORMAL HIGH (ref 0.8–1.2)
Prothrombin Time: 23.7 seconds — ABNORMAL HIGH (ref 11.4–15.2)

## 2019-05-29 LAB — HEPARIN LEVEL (UNFRACTIONATED): Heparin Unfractionated: 0.44 IU/mL (ref 0.30–0.70)

## 2019-05-29 NOTE — Progress Notes (Addendum)
PROGRESS NOTE    Stacie Wright  ZOX:[id number] DOB: [birth year] DOA: 05/22/2019 PCP: Benita StabileHall, [name], MD    Brief Narrative:  85 year old female with a history of Alzheimer's dementia, anxiety, depression, COPD, left lower extremity DVT on warfarin, hypertension not on medications, MAI infection, normocytic anemia brought to ED from skilled nursing facility due to rectal bleeding.  Patient has advanced dementia so unable to provide any history.  No family member could be contacted by admitting physician.  Assessment & Plan:   Active Problems:   Depression   Pressure injury of skin   GI bleed   Hematochezia  1. Rectal bleeding-FOBT positive. GI was consulted, no plans for endoscopy.  If she rebleeds consider, CT abdomen pelvis. Pt has been continued on coumadin. Labs reviewed. Hgb remains 9.7 and stable  2. Dysphagia-concern for dysphagia per nursing staff.  Pt was earlier seen by SLP with recommendation for full liquid diet. Pt remains stable at this time  3. Anemia of chronic disease-likely chronic blood loss anemia, FOBT positive.  MCV is 109.6.  Hemoglobin thus far remains stable. Hgb remains 9.7. Will repeat CBC in AM  4. Chronic Left lower extremity DVT-Pt presented with supratherapeutic INR.  Warfarin was on hold. Repeat LE dopplers, reviewed with continued LLE DVT. Family had agreed with continuing anticoagulation. Hgb remains stable relatively. Repeat cbc and INR in AM. Pharmacy to dose coumadin. INR today up to 2.2. Heparin on now on hold  5. Advanced dementia-patient has Alzheimer's dementia. Seems to be stable at this time  6. Chronic COPD-no acute respiratory issue this AM, on minimal O2 support.  Will continue pt on PRN duonebs as tolerated  7. Myocardium avium intracellulare infection-continue home ethambutol and rifampin reportedly on this since 12/20/2018. Currently stable  8. Anxiety/depression-continue home Xanax, Depakote, Zyprexa, Paxil, Remeron, melatonin. Stable  at this time  DVT prophylaxis: Coumadin Code Status: DNR Family Communication: Pt in room, family not at bedside Disposition Plan: Uncertain at this time  Consultants:   GI  Procedures:     Antimicrobials: Anti-infectives (From admission, onward)   Start     Dose/Rate Route Frequency Ordered Stop   05/23/19 0900  ethambutol (MYAMBUTOL) tablet 1,600 mg    Note to Pharmacy: Monday, Wednesday, & Friday     1,600 mg Oral 3 times weekly 05/22/19 1829     05/23/19 0900  rifampin (RIFADIN) capsule 600 mg    Note to Pharmacy: Monday, Wednesday, & Friday     600 mg Oral 3 times weekly 05/22/19 1829        Subjective: No complaints at this time  Objective: Vitals:   05/28/19 1433 05/28/19 2315 05/29/19 0532 05/29/19 1327  BP: (!) 148/61 (!) 142/52 (!) 134/51 (!) 138/54  Pulse: 70 73 76 73  Resp: 15 17 17 14   Temp: 97.6 F (36.4 C) 98.6 F (37 C) 98.2 F (36.8 C) 98.1 F (36.7 C)  TempSrc: Oral Oral Oral Oral  SpO2: 100% 100% 98% 100%  Weight:      Height:        Intake/Output Summary (Last 24 hours) at 05/29/2019 1416 Last data filed at 05/29/2019 0942 Gross per 24 hour  Intake 1164.99 ml  Output 300 ml  Net 864.99 ml   Filed Weights   05/22/19 1812  Weight: 76.7 kg    Examination: General exam: Asleep, laying in bed, in nad Respiratory system: Normal respiratory effort, no wheezing Cardiovascular system: regular rate, s1, s2 Gastrointestinal system: Soft, nondistended, positive BS  Central nervous system: CN2-12 grossly intact, strength intact Extremities: Perfused, no clubbing Skin: Normal skin turgor, no notable skin lesions seen Psychiatry: Unable to fully assess given mentation   Data Reviewed: I have personally reviewed following labs and imaging studies  CBC: Recent Labs  Lab 05/22/19 1429  05/25/19 0408 05/26/19 0502 05/27/19 1712 05/28/19 0113 05/29/19 0527  WBC 5.7   < > 5.0 4.6 5.1 4.2 4.2  NEUTROABS 4.0  --   --   --   --   --   --    HGB 10.5*   < > 10.2* 10.3* 10.2* 9.7* 9.7*  HCT 34.6*   < > 32.8* 32.2* 33.4* 31.1* 30.8*  MCV 105.5*   < > 103.1* 102.5* 106.4* 103.3* 103.7*  PLT 186   < > 148* 143* 144* 119* 131*   < > = values in this interval not displayed.   Basic Metabolic Panel: Recent Labs  Lab 05/23/19 0539 05/24/19 0425 05/26/19 0502 05/27/19 0457 05/28/19 0113  NA 143 140 143 145 142  K 3.6 3.9 3.2* 4.1 3.6  CL 103 104 104 106 104  CO2 28 30 26 29 31   GLUCOSE 78 77 85 95 99  BUN 21 14 11 8  6*  CREATININE 0.68 0.63 0.63 0.68 0.63  CALCIUM 8.8* 8.5* 7.8* 8.3* 8.1*   GFR: Estimated Creatinine Clearance: 49.9 mL/min (by C-G formula based on SCr of 0.63 mg/dL). Liver Function Tests: Recent Labs  Lab 05/22/19 1429  AST 17  ALT 16  ALKPHOS 51  BILITOT 0.3  PROT 6.8  ALBUMIN 3.2*   No results for input(s): LIPASE, AMYLASE in the last 168 hours. No results for input(s): AMMONIA in the last 168 hours. Coagulation Profile: Recent Labs  Lab 05/23/19 0856 05/25/19 0408 05/27/19 1515 05/28/19 0113 05/29/19 0527  INR 3.3* 2.8* 1.5* 1.6* 2.2*   Cardiac Enzymes: No results for input(s): CKTOTAL, CKMB, CKMBINDEX, TROPONINI in the last 168 hours. BNP (last 3 results) No results for input(s): PROBNP in the last 8760 hours. HbA1C: No results for input(s): HGBA1C in the last 72 hours. CBG: No results for input(s): GLUCAP in the last 168 hours. Lipid Profile: No results for input(s): CHOL, HDL, LDLCALC, TRIG, CHOLHDL, LDLDIRECT in the last 72 hours. Thyroid Function Tests: No results for input(s): TSH, T4TOTAL, FREET4, T3FREE, THYROIDAB in the last 72 hours. Anemia Panel: No results for input(s): VITAMINB12, FOLATE, FERRITIN, TIBC, IRON, RETICCTPCT in the last 72 hours. Sepsis Labs: No results for input(s): PROCALCITON, LATICACIDVEN in the last 168 hours.  Recent Results (from the past 240 hour(s))  SARS CORONAVIRUS 2 (TAT 6-24 HRS) Nasopharyngeal Nasopharyngeal Swab     Status: None    Collection Time: 05/22/19  4:06 PM   Specimen: Nasopharyngeal Swab  Result Value Ref Range Status   SARS Coronavirus 2 NEGATIVE NEGATIVE Final    Comment: (NOTE) SARS-CoV-2 target nucleic acids are NOT DETECTED. The SARS-CoV-2 RNA is generally detectable in upper and lower respiratory specimens during the acute phase of infection. Negative results do not preclude SARS-CoV-2 infection, do not rule out co-infections with other pathogens, and should not be used as the sole basis for treatment or other patient management decisions. Negative results must be combined with clinical observations, patient history, and epidemiological information. The expected result is Negative. Fact Sheet for Patients: SugarRoll.be Fact Sheet for Healthcare Providers: https://www.woods-mathews.com/ This test is not yet approved or cleared by the [place] FDA and  has been authorized for detection and/or diagnosis of  SARS-CoV-2 by FDA under an Emergency Use Authorization (EUA). This EUA will remain  in effect (meaning this test can be used) for the duration of the COVID-19 declaration under Section 56 4(b)(1) of the Act, 21 U.S.C. section 360bbb-3(b)(1), unless the authorization is terminated or revoked sooner. Performed at [facility], [address], [place], [place] [address]   MRSA PCR Screening     Status: None   Collection Time: 05/22/19  6:18 PM   Specimen: Nasal Mucosa; Nasopharyngeal  Result Value Ref Range Status   MRSA by PCR NEGATIVE NEGATIVE Final    Comment:        The GeneXpert MRSA Assay (FDA approved for NASAL specimens only), is one component of a comprehensive MRSA colonization surveillance program. It is not intended to diagnose MRSA infection nor to guide or monitor treatment for MRSA infections. Performed at [facility], [address], [place], [place] [address]      Radiology Studies: No results  found.  Scheduled Meds: . ALPRAZolam  0.5 mg Oral BID  . divalproex  375 mg Oral TID  . docusate sodium  100 mg Oral BID  . ethambutol  1,600 mg Oral 3 times weekly  . influenza vaccine adjuvanted  0.5 mL Intramuscular Tomorrow-1000  . loratadine  10 mg Oral Daily  . mirtazapine  3.75 mg Oral QHS  . OLANZapine zydis  7.5 mg Oral QHS  . pantoprazole (PROTONIX) IV  40 mg Intravenous Q12H  . PARoxetine  10 mg Oral Daily  . rifampin  600 mg Oral 3 times weekly  . Warfarin - Pharmacist Dosing Inpatient   Does not apply q1800   Continuous Infusions: . sodium chloride 50 mL/hr at 05/29/19 0344     LOS: 4 days   Rickey Barbara, MD [facility] Pager On Amion  If 7PM-7AM, please contact night-coverage 05/29/2019, 2:16 PM

## 2019-05-29 NOTE — Progress Notes (Signed)
ANTICOAGULATION CONSULT NOTE - Follow Up Consult  Pharmacy Consult for warfarin Indication: VTE treatment  Allergies  Allergen Reactions  . Amoxicillin Anaphylaxis and Rash  . Darvocet [Propoxyphene N-Acetaminophen] Other (See Comments)    hallucinations  . Penicillins Anaphylaxis and Rash    Has patient had a PCN reaction causing immediate rash, facial/tongue/throat swelling, SOB or lightheadedness with hypotension: Yes Has patient had a PCN reaction causing severe rash involving mucus membranes or skin necrosis: No Has patient had a PCN reaction that required hospitalization No Has patient had a PCN reaction occurring within the last 10 years: No If all of the above answers are "NO", then may proceed with Cephalosporin use.   . Sulfa Antibiotics Anaphylaxis and Rash  . Propoxyphene Other (See Comments)    hallucinations    Patient Measurements: Height: 5\' 6"  (167.6 cm) Weight: 169 lb 1.5 oz (76.7 kg) IBW/kg (Calculated) : 59.3 Heparin Dosing Weight: 75 kg  Vital Signs: Temp: 98.2 F (36.8 C) (09/13 0532) Temp Source: Oral (09/13 0532) BP: 134/51 (09/13 0532) Pulse Rate: 76 (09/13 0532)  Labs: Recent Labs    05/27/19 0457 05/27/19 1515  05/27/19 1712  05/28/19 0113 05/28/19 1041 05/28/19 2048 05/29/19 0527  HGB  --   --    < > 10.2*  --  9.7*  --   --  9.7*  HCT  --   --   --  33.4*  --  31.1*  --   --  30.8*  PLT  --   --   --  144*  --  119*  --   --  131*  APTT  --   --   --  28  --   --   --   --   --   LABPROT  --  17.6*  --   --   --  18.6*  --   --  23.7*  INR  --  1.5*  --   --   --  1.6*  --   --  2.2*  HEPARINUNFRC  --   --   --   --    < > 0.42 0.62 0.57 0.44  CREATININE 0.68  --   --   --   --  0.63  --   --   --    < > = values in this interval not displayed.    Estimated Creatinine Clearance: 49.9 mL/min (by C-G formula based on SCr of 0.63 mg/dL).  Assessment: Pharmacy consulted to dose warfarin and heparin bridge in this 85 year old female  for VTE treatment.   Patient has PMH of alzheimer's dementia, HTN, MAI infection, normocytic anemia, and history of LLE DVT for which she was taking warfarin PTA. Pt admitted to hospital on 9/6 with rectal bleeding and supratherapeutic INR of 4.1. LD of warfarin was 9/5 PTA.  Anticoagulation has been held throughout admission, but is being resumed today. Venous dopplar (9/11) shows chronic DVT in left leg. Per GI note recommendation (9/8), will target lower end of therapeutic range for INR. Per consult, ok to resume anticoagulation per GI.   Patient is on rifampin 600 mg PO three times a week - medication has significant drug interaction with warfarin, but patient was prescribed both PTA.  _____________________________________________  Warfarin dose PTA: warfarin 7.5 mg PO daily INR: 4.1 on admission  Significant Events: -9/6 - 9/10: Warfarin held -9/11: Venous dopplar showing chronic DVT in left leg. Anticoagulation resumed  Today, 05/29/19  Hgb:  9.7 - low but stable  Plt: 131 - low but stable  INR = 2.2 is therapeutic, increased from yesterday  HL = 0.44 is therapeutic on heparin infusion of 700 units/hr  No signs/symptoms bleeding or bruising per nursing.  Patient on full liquid diet, 10 % of meal charted yesterday. Reduced PO intake.  Significant drug interactions: rifampin is is an inducer and can decrease serum concentrations of vitamin K antagonists. Of note, patient was on both medications PTA.   Goal of Therapy:  INR 2-3 (target 2-2.5) Heparin level 0.3-0.7 units/ml Monitor platelets by anticoagulation protocol: Yes   Plan:   Discontinue heparin since INR is therapeutic. Discussed with MD.  Hold warfarin this evening given increase in INR and reduced PO intake  INR daily. CBC tomorrow morning.  Monitor closely for any signs/symptoms of bleeding  Patient will need to discharge on a lower dose of warfarin than she was previously taking. Follow along for  disposition plan.  Lenis Noon, PharmD 05/29/2019,8:50 AM

## 2019-05-30 LAB — CBC
HCT: 33.1 % — ABNORMAL LOW (ref 36.0–46.0)
Hemoglobin: 10.5 g/dL — ABNORMAL LOW (ref 12.0–15.0)
MCH: 32.2 pg (ref 26.0–34.0)
MCHC: 31.7 g/dL (ref 30.0–36.0)
MCV: 101.5 fL — ABNORMAL HIGH (ref 80.0–100.0)
Platelets: 131 10*3/uL — ABNORMAL LOW (ref 150–400)
RBC: 3.26 MIL/uL — ABNORMAL LOW (ref 3.87–5.11)
RDW: 12.7 % (ref 11.5–15.5)
WBC: 3.9 10*3/uL — ABNORMAL LOW (ref 4.0–10.5)
nRBC: 0 % (ref 0.0–0.2)

## 2019-05-30 LAB — PROTIME-INR
INR: 2.5 — ABNORMAL HIGH (ref 0.8–1.2)
Prothrombin Time: 27 seconds — ABNORMAL HIGH (ref 11.4–15.2)

## 2019-05-30 MED ORDER — WARFARIN SODIUM 2.5 MG PO TABS
2.5000 mg | ORAL_TABLET | Freq: Once | ORAL | Status: AC
  Administered 2019-05-30: 2.5 mg via ORAL
  Filled 2019-05-30: qty 1

## 2019-05-30 NOTE — Progress Notes (Signed)
Stacie Wright for warfarin Indication: VTE treatment  Allergies  Allergen Reactions  . Amoxicillin Anaphylaxis and Rash  . Darvocet [Propoxyphene N-Acetaminophen] Other (See Comments)    hallucinations  . Penicillins Anaphylaxis and Rash    Has patient had a PCN reaction causing immediate rash, facial/tongue/throat swelling, SOB or lightheadedness with hypotension: Yes Has patient had a PCN reaction causing severe rash involving mucus membranes or skin necrosis: No Has patient had a PCN reaction that required hospitalization No Has patient had a PCN reaction occurring within the last 10 years: No If all of the above answers are "NO", then may proceed with Cephalosporin use.   . Sulfa Antibiotics Anaphylaxis and Rash  . Propoxyphene Other (See Comments)    hallucinations    Patient Measurements: Height: 5\' 6"  (167.6 cm) Weight: 169 lb 1.5 oz (76.7 kg) IBW/kg (Calculated) : 59.3  Vital Signs: Temp: 98.2 F (36.8 C) (09/14 0600) Temp Source: Oral (09/14 0600) BP: 161/66 (09/14 0600) Pulse Rate: 77 (09/14 0600)  Labs: Recent Labs    05/27/19 1712  05/28/19 0113 05/28/19 1041 05/28/19 2048 05/29/19 0527 05/30/19 0922  HGB 10.2*  --  9.7*  --   --  9.7* 10.5*  HCT 33.4*  --  31.1*  --   --  30.8* 33.1*  PLT 144*  --  119*  --   --  131* 131*  APTT 28  --   --   --   --   --   --   LABPROT  --   --  18.6*  --   --  23.7* 27.0*  INR  --   --  1.6*  --   --  2.2* 2.5*  HEPARINUNFRC  --    < > 0.42 0.62 0.57 0.44  --   CREATININE  --   --  0.63  --   --   --   --    < > = values in this interval not displayed.    Estimated Creatinine Clearance: 49.9 mL/min (by C-G formula based on SCr of 0.63 mg/dL).  Assessment: Pharmacy consulted to dose warfarin and heparin bridge in this 85 year old female for VTE treatment.   Patient has PMH of alzheimer's dementia, HTN, MAI infection, normocytic anemia, and history of LLE DVT for which she was  taking warfarin PTA. Pt admitted to hospital on 9/6 with rectal bleeding and supratherapeutic INR of 4.1. LD of warfarin was 9/5 PTA.  Anticoagulation has been held throughout admission, but is being resumed today. Venous dopplar (9/11) shows chronic DVT in left leg. Per GI note recommendation (9/8), will target lower end of therapeutic range for INR. Per consult, ok to resume anticoagulation per GI.   Patient is on rifampin 600 mg PO three times a week - medication has significant drug interaction with warfarin, but patient was prescribed both PTA.  _____________________________________________  Warfarin dose PTA: warfarin 7.5 mg PO daily INR: 4.1 on admission  Significant Events: -9/6 - 9/10: Warfarin held -9/11: Venous dopplar showing chronic DVT in left leg. Anticoagulation resumed  Today, 05/30/19  CBC: Hgb improved; Plt slightly low but stable  INR remains therapeutic but rising slowly  No signs/symptoms bleeding or bruising per nursing.  Patient on full liquid diet, 10 % of meal charted yesterday. Reduced PO intake.  Significant drug interactions: rifampin is is an inducer and can decrease serum concentrations of vitamin K antagonists. Of note, patient was on both medications PTA  Eating  10-40% of meals  Goal of Therapy:  INR 2-3 (target 2-2.5)   Plan:   Warfarin 2.5 mg PO tonight - INR is at high end of goal range, but given skipped dose yesterday, I anticipate INR will level off and begin to drop soon   INR daily; CBC tomorrow morning q72 or more frequently as needed  Monitor closely for any signs/symptoms of bleeding  INR trend not fully clear yet; at this point would recommend discharging on 2.5 mg daily with INR recheck in 3 days. Suspect maintenance dose will be ~5 mg daily once stable.    Bernadene Personrew Salmaan Patchin, PharmD, BCPS [phone] 05/30/2019, 10:14 AM

## 2019-05-30 NOTE — Progress Notes (Signed)
PROGRESS NOTE    Stacie CourtDorothy H Wright  ZOX:[id number] DOB: [birth year] DOA: 05/22/2019 PCP: Benita StabileHall, [name], MD    Brief Narrative:  85 year old female with a history of Alzheimer's dementia, anxiety, depression, COPD, left lower extremity DVT on warfarin, hypertension not on medications, MAI infection, normocytic anemia brought to ED from skilled nursing facility due to rectal bleeding.  Patient has advanced dementia so unable to provide any history.  No family member could be contacted by admitting physician.  Assessment & Plan:   Active Problems:   Depression   Pressure injury of skin   GI bleed   Hematochezia  1. Rectal bleeding-FOBT positive. GI was consulted, no plans for endoscopy.  If she rebleeds consider, CT abdomen pelvis. Pt has been continued on coumadin. AM labs are reviewed. Hgb stable at 10.5  2. Dysphagia-concern for dysphagia per nursing staff.  Pt was earlier seen by SLP with recommendation for full liquid diet. Presently stable at this time   3. Anemia of chronic disease-likely chronic blood loss anemia, FOBT positive.  Hemoglobin thus far remains stable. Hgb currently 10.5. Will repeat cbc in AM  4. Chronic Left lower extremity DVT-Pt presented with supratherapeutic INR.  Warfarin was on hold. Repeat LE dopplers, reviewed with continued LLE DVT. Family had agreed with continuing anticoagulation. Hgb remains stable relatively. Recheck cbc in INR in AM. Discussed with Pharmacy, will be cautious with anticoagulation and monitor closely for bleeding  5. Advanced dementia-patient has Alzheimer's dementia. Seems to be stable currently  6. Chronic COPD-no acute respiratory issue this AM, on minimal O2 support. Pt is continue on PRN duonebs  7. Myocardium avium intracellulare infection-continue home ethambutol and rifampin reportedly on this since 12/20/2018. Presently stable  8. Anxiety/depression-continue home Xanax, Depakote, Zyprexa, Paxil, Remeron, melatonin. Presently  stable at this time  DVT prophylaxis: Coumadin Code Status: DNR Family Communication: Pt in room, family not at bedside Disposition Plan: Uncertain at this time  Consultants:   GI  Procedures:     Antimicrobials: Anti-infectives (From admission, onward)   Start     Dose/Rate Route Frequency Ordered Stop   05/23/19 0900  ethambutol (MYAMBUTOL) tablet 1,600 mg    Note to Pharmacy: Monday, Wednesday, & Friday     1,600 mg Oral 3 times weekly 05/22/19 1829     05/23/19 0900  rifampin (RIFADIN) capsule 600 mg    Note to Pharmacy: Monday, Wednesday, & Friday     600 mg Oral 3 times weekly 05/22/19 1829        Subjective: Difficult to assess given current mentation  Objective: Vitals:   05/29/19 1327 05/29/19 2316 05/30/19 0600 05/30/19 1259  BP: (!) 138/54 (!) 145/67 (!) 161/66 (!) 145/91  Pulse: 73 70 77 85  Resp: 14 16 15 16   Temp: 98.1 F (36.7 C) 98.4 F (36.9 C) 98.2 F (36.8 C) 98.1 F (36.7 C)  TempSrc: Oral Oral Oral Oral  SpO2: 100% 100% 100% 95%  Weight:      Height:        Intake/Output Summary (Last 24 hours) at 05/30/2019 1533 Last data filed at 05/30/2019 1326 Gross per 24 hour  Intake 1104.72 ml  Output 1100 ml  Net 4.72 ml   Filed Weights   05/22/19 1812  Weight: 76.7 kg    Examination: .General exam: asleep, in no acute distress Respiratory system: normal chest rise, clear, no audible wheezing Cardiovascular system: regular rhythm, s1-s2 Gastrointestinal system: Nondistended, nontender, pos BS Central nervous system: No seizures, no tremors  Extremities: No cyanosis, no joint deformities Skin: No rashes, no pallor Psychiatry: difficult to assess given current mentation  Data Reviewed: I have personally reviewed following labs and imaging studies  CBC: Recent Labs  Lab 05/26/19 0502 05/27/19 1712 05/28/19 0113 05/29/19 0527 05/30/19 0922  WBC 4.6 5.1 4.2 4.2 3.9*  HGB 10.3* 10.2* 9.7* 9.7* 10.5*  HCT 32.2* 33.4* 31.1* 30.8*  33.1*  MCV 102.5* 106.4* 103.3* 103.7* 101.5*  PLT 143* 144* 119* 131* 131*   Basic Metabolic Panel: Recent Labs  Lab 05/24/19 0425 05/26/19 0502 05/27/19 0457 05/28/19 0113  NA 140 143 145 142  K 3.9 3.2* 4.1 3.6  CL 104 104 106 104  CO2 30 26 29 31   GLUCOSE 77 85 95 99  BUN 14 11 8  6*  CREATININE 0.63 0.63 0.68 0.63  CALCIUM 8.5* 7.8* 8.3* 8.1*   GFR: Estimated Creatinine Clearance: 49.9 mL/min (by C-G formula based on SCr of 0.63 mg/dL). Liver Function Tests: No results for input(s): AST, ALT, ALKPHOS, BILITOT, PROT, ALBUMIN in the last 168 hours. No results for input(s): LIPASE, AMYLASE in the last 168 hours. No results for input(s): AMMONIA in the last 168 hours. Coagulation Profile: Recent Labs  Lab 05/25/19 0408 05/27/19 1515 05/28/19 0113 05/29/19 0527 05/30/19 0922  INR 2.8* 1.5* 1.6* 2.2* 2.5*   Cardiac Enzymes: No results for input(s): CKTOTAL, CKMB, CKMBINDEX, TROPONINI in the last 168 hours. BNP (last 3 results) No results for input(s): PROBNP in the last 8760 hours. HbA1C: No results for input(s): HGBA1C in the last 72 hours. CBG: No results for input(s): GLUCAP in the last 168 hours. Lipid Profile: No results for input(s): CHOL, HDL, LDLCALC, TRIG, CHOLHDL, LDLDIRECT in the last 72 hours. Thyroid Function Tests: No results for input(s): TSH, T4TOTAL, FREET4, T3FREE, THYROIDAB in the last 72 hours. Anemia Panel: No results for input(s): VITAMINB12, FOLATE, FERRITIN, TIBC, IRON, RETICCTPCT in the last 72 hours. Sepsis Labs: No results for input(s): PROCALCITON, LATICACIDVEN in the last 168 hours.  Recent Results (from the past 240 hour(s))  SARS CORONAVIRUS 2 (TAT 6-24 HRS) Nasopharyngeal Nasopharyngeal Swab     Status: None   Collection Time: 05/22/19  4:06 PM   Specimen: Nasopharyngeal Swab  Result Value Ref Range Status   SARS Coronavirus 2 NEGATIVE NEGATIVE Final    Comment: (NOTE) SARS-CoV-2 target nucleic acids are NOT DETECTED. The  SARS-CoV-2 RNA is generally detectable in upper and lower respiratory specimens during the acute phase of infection. Negative results do not preclude SARS-CoV-2 infection, do not rule out co-infections with other pathogens, and should not be used as the sole basis for treatment or other patient management decisions. Negative results must be combined with clinical observations, patient history, and epidemiological information. The expected result is Negative. Fact Sheet for Patients: HairSlick.no Fact Sheet for Healthcare Providers: quierodirigir.com This test is not yet approved or cleared by the [place] FDA and  has been authorized for detection and/or diagnosis of SARS-CoV-2 by FDA under an Emergency Use Authorization (EUA). This EUA will remain  in effect (meaning this test can be used) for the duration of the COVID-19 declaration under Section 56 4(b)(1) of the Act, 21 U.S.C. section 360bbb-3(b)(1), unless the authorization is terminated or revoked sooner. Performed at [facility], [address] [place], [place] [address]   MRSA PCR Screening     Status: None   Collection Time: 05/22/19  6:18 PM   Specimen: Nasal Mucosa; Nasopharyngeal  Result Value Ref Range Status  MRSA by PCR NEGATIVE NEGATIVE Final    Comment:        The GeneXpert MRSA Assay (FDA approved for NASAL specimens only), is one component of a comprehensive MRSA colonization surveillance program. It is not intended to diagnose MRSA infection nor to guide or monitor treatment for MRSA infections. Performed at [facility], [place] [place], [place] [address]      Radiology Studies: No results found.  Scheduled Meds: . ALPRAZolam  0.5 mg Oral BID  . divalproex  375 mg Oral TID  . docusate sodium  100 mg Oral BID  . ethambutol  1,600 mg Oral 3 times weekly  . influenza vaccine adjuvanted  0.5 mL Intramuscular  Tomorrow-1000  . loratadine  10 mg Oral Daily  . mirtazapine  3.75 mg Oral QHS  . OLANZapine zydis  7.5 mg Oral QHS  . pantoprazole (PROTONIX) IV  40 mg Intravenous Q12H  . PARoxetine  10 mg Oral Daily  . rifampin  600 mg Oral 3 times weekly  . warfarin  2.5 mg Oral ONCE-1800  . Warfarin - Pharmacist Dosing Inpatient   Does not apply q1800   Continuous Infusions: . sodium chloride 50 mL/hr at 05/30/19 1326     LOS: 5 days   Marylu Lund, MD [facility] Pager On Amion  If 7PM-7AM, please contact night-coverage 05/30/2019, 3:33 PM

## 2019-05-30 NOTE — Care Management Important Message (Signed)
Important Message  Patient Details IM Letter given to Dessa Phi RN to present to the Patient Name: Stacie Wright MRN: [id number] Date of Birth: Apr 30, 1928   Medicare Important Message Given:  Yes     Kerin Salen 05/30/2019, 11:29 AM

## 2019-05-30 NOTE — Progress Notes (Signed)
  Speech Language Pathology Treatment: Dysphagia  Patient Details Name: Stacie Wright MRN: [id number] DOB: [birth year] Today's Date: 05/30/2019 Time: 1750-1800 SLP Time Calculation (min) (ACUTE ONLY): 10 min  Assessment / Plan / Recommendation Clinical Impression  Pt upright in bed with SlP raising HOB.  She was observed to cough on secretions when HOB elevated.  Provided pt with intake of coke float via tsp, tip of straw and cup.  No overt indication of aspiration however pt's swallow remains significantly delayed at times requiring small amount of extra bolus on tsp to help elicit a swallow.  She will require full assist and do not anticipate she will have adequate po intake given her level of dysphagia.  She is not appropriate for solids or viscous purees due to level of oral difficulties.  RN reports pt tolerating applesauce with medications and per chart, pt consumed an Ensure otherwise her intake remains poor.  At this time, recommend to continue full liquid diet indefinitely with full support.     HPI HPI: 85 yo female adm to [facility] with AMS- rectal bleed, Pt has h/o very advanced dementia, and has not been swallowing medications per RN as she is orally holding all intake.  Per notes, no plan for aggressive GI treatment  BSE completed yesterday with pt tolerating full liquids better than solids/puree.  Follow up to assess po tolerance indicated.  Earlier in am pt was sound asleep but later she was fully alert. NT reports pt with 100% intake of lunch with good tolerance and no overt indication of aspiration.      SLP Plan  Continue with current plan of care       Recommendations  Diet recommendations: Thin liquid Liquids provided via: Cup;Teaspoon(tip of straw) Medication Administration: (try whole with icecream if small, anticipate she will not swallow crushed due to lack of palatiblity) Supervision: Patient able to self feed Compensations: Slow rate;Small sips/bites(assure pt swallows,  dry swallow or minimal extra liquid on spoon to elicit, oral care after all intake) Postural Changes and/or Swallow Maneuvers: Seated upright 90 degrees;Upright 30-60 min after meal                Follow up Recommendations: Skilled Nursing facility SLP Visit Diagnosis: Dysphagia, oral phase (R13.11) Plan: Continue with current plan of care       [place], [place] [facility] SLP [place] Pager [phone] Office [phone]  Macario Golds 05/30/2019, 7:22 PM

## 2019-05-30 NOTE — TOC Progression Note (Signed)
Transition of Care [facility]) - Progression Note    Patient Details  Name: Stacie Wright MRN: [id number] Date of Birth: 06-01-1928  Transition of [facility]) CM/SW Contact  Ivana Nicastro, Juliann Pulse, RN Phone Number: 05/30/2019, 10:36 AM  Clinical Narrative: Damaris Schooner to [facility] to accept back once medically stable-from LTC [employer] privately pays-awaiting new COVID test, & inr to be therapeutic. HTA has [facility] for non emergency transport back to SNF-LTC.         Expected Discharge Plan and Services                                                 Social Determinants of Health (SDOH) Interventions    Readmission Risk Interventions No flowsheet data found.

## 2019-05-31 LAB — PROTIME-INR
INR: 2.9 — ABNORMAL HIGH (ref 0.8–1.2)
Prothrombin Time: 29.9 seconds — ABNORMAL HIGH (ref 11.4–15.2)

## 2019-05-31 LAB — CBC
HCT: 35.9 % — ABNORMAL LOW (ref 36.0–46.0)
Hemoglobin: 11.4 g/dL — ABNORMAL LOW (ref 12.0–15.0)
MCH: 32.5 pg (ref 26.0–34.0)
MCHC: 31.8 g/dL (ref 30.0–36.0)
MCV: 102.3 fL — ABNORMAL HIGH (ref 80.0–100.0)
Platelets: 123 10*3/uL — ABNORMAL LOW (ref 150–400)
RBC: 3.51 MIL/uL — ABNORMAL LOW (ref 3.87–5.11)
RDW: 12.8 % (ref 11.5–15.5)
WBC: 3.6 10*3/uL — ABNORMAL LOW (ref 4.0–10.5)
nRBC: 0 % (ref 0.0–0.2)

## 2019-05-31 LAB — BASIC METABOLIC PANEL
Anion gap: 10 (ref 5–15)
BUN: <5 mg/dL — ABNORMAL LOW (ref 8–23)
CO2: 33 mmol/L — ABNORMAL HIGH (ref 22–32)
Calcium: 8.5 mg/dL — ABNORMAL LOW (ref 8.9–10.3)
Chloride: 102 mmol/L (ref 98–111)
Creatinine, Ser: 0.7 mg/dL (ref 0.44–1.00)
GFR calc Af Amer: >60 mL/min (ref >60)
GFR calc non Af Amer: >60 mL/min (ref >60)
Glucose, Bld: 88 mg/dL (ref 70–99)
Potassium: 3.7 mmol/L (ref 3.5–5.1)
Sodium: 145 mmol/L (ref 135–145)

## 2019-05-31 NOTE — Progress Notes (Signed)
Stacie Wright for warfarin Indication: VTE treatment  Allergies  Allergen Reactions  . Amoxicillin Anaphylaxis and Rash  . Darvocet [Propoxyphene N-Acetaminophen] Other (See Comments)    hallucinations  . Penicillins Anaphylaxis and Rash    Has patient had a PCN reaction causing immediate rash, facial/tongue/throat swelling, SOB or lightheadedness with hypotension: Yes Has patient had a PCN reaction causing severe rash involving mucus membranes or skin necrosis: No Has patient had a PCN reaction that required hospitalization No Has patient had a PCN reaction occurring within the last 10 years: No If all of the above answers are "NO", then may proceed with Cephalosporin use.   . Sulfa Antibiotics Anaphylaxis and Rash  . Propoxyphene Other (See Comments)    hallucinations    Patient Measurements: Height: 5\' 6"  (167.6 cm) Weight: 169 lb 1.5 oz (76.7 kg) IBW/kg (Calculated) : 59.3  Vital Signs: Temp: 97.8 F (36.6 C) (09/15 0521) Temp Source: Oral (09/15 0521) BP: 152/82 (09/15 0521) Pulse Rate: 76 (09/15 0521)  Labs: Recent Labs    05/28/19 1041 05/28/19 2048  05/29/19 0527 05/30/19 0922 05/31/19 0826  HGB  --   --    < > 9.7* 10.5* 11.4*  HCT  --   --   --  30.8* 33.1* 35.9*  PLT  --   --   --  131* 131* 123*  LABPROT  --   --   --  23.7* 27.0* 29.9*  INR  --   --   --  2.2* 2.5* 2.9*  HEPARINUNFRC 0.62 0.57  --  0.44  --   --   CREATININE  --   --   --   --   --  0.70   < > = values in this interval not displayed.    Estimated Creatinine Clearance: 49.9 mL/min (by C-G formula based on SCr of 0.7 mg/dL).  Assessment: Pharmacy consulted to dose warfarin and heparin bridge in this 85 year old female for VTE treatment.   Patient has PMH of alzheimer's dementia, HTN, MAI infection, normocytic anemia, and history of LLE DVT for which she was taking warfarin PTA. Pt admitted to hospital on 9/6 with rectal bleeding and supratherapeutic  INR of 4.1. LD of warfarin was 9/5 PTA.  Anticoagulation has been held throughout admission, but is being resumed today. Venous dopplar (9/11) shows chronic DVT in left leg. Per GI note recommendation (9/8), will target lower end of therapeutic range for INR. Per consult, ok to resume anticoagulation per GI.   Patient is on rifampin 600 mg PO three times a week - medication has significant drug interaction with warfarin, but patient was prescribed both PTA.  _____________________________________________  Warfarin dose PTA: warfarin 7.5 mg PO daily INR: 4.1 on admission  Significant Events: -9/6 - 9/10: Warfarin held -9/11: Venous dopplar showing chronic DVT in left leg. Anticoagulation resumed  Today, 05/31/19  CBC: Hgb improved; Plt slightly low but stable  INR now above desired goal range 2.0-2.5; did not level off with skipped dose as expected  No signs/symptoms bleeding or bruising per nursing.  Significant drug interactions: rifampin is is an inducer and can decrease serum concentrations of VKAs. Of note, patient was on both medications PTA  Eating 25-40% of meals  Goal of Therapy:  INR 2-3 (target 2-2.5)   Plan:   Hold warfarin tonight   INR daily; CBC tomorrow morning q72 or more frequently as needed  Monitor closely for any signs/symptoms of bleeding  Generally  avoid Vitamin K with mild INR elevation without bleeding, but given recent rectal bleeding and implications of future bleeding (permanently stopping all anticoagulation in setting of chronic DVT), may consider giving a small dose of Vit K (i.e. 1 mg PO) if INR continues to trend above 3  INR trend not fully clear yet; at this point would recommend holding warfarin at discharge with close INR f/u    Bernadene Personrew Barb Shear, PharmD, BCPS [phone] 05/31/2019, 10:20 AM

## 2019-05-31 NOTE — Progress Notes (Signed)
PROGRESS NOTE    Stacie Wright  MGQ:[id number] DOB: 01-24-1928 DOA: 05/22/2019 PCP: Celene Squibb, MD    Brief Narrative:  85 year old female with a history of Alzheimer's dementia, anxiety, depression, COPD, left lower extremity DVT on warfarin, hypertension not on medications, MAI infection, normocytic anemia brought to ED from skilled nursing facility due to rectal bleeding.  Patient has advanced dementia so unable to provide any history.  No family member could be contacted by admitting physician.  Assessment & Plan:   Active Problems:   Depression   Pressure injury of skin   GI bleed   Hematochezia  1. Rectal bleeding-FOBT positive. GI was consulted, no plans for endoscopy.  If she rebleeds consider, CT abdomen pelvis. Pt has been continued on coumadin. Labs reviewed. Hgb up to 11.4  2. Dysphagia-concern for dysphagia per nursing staff.  Pt was earlier seen by SLP with recommendation for full liquid diet. Remains stable at this time   3. Anemia of chronic disease-likely chronic blood loss anemia, FOBT positive.  Hemoglobin thus far remains stable. Hgb currently 11.4. Will repeat CBC at this time  4. Chronic Left lower extremity DVT-Pt presented with supratherapeutic INR.  Warfarin was on hold. Repeat LE dopplers, reviewed with continued LLE DVT. Family had agreed with continuing anticoagulation. Hgb remains stable relatively. INR is trending up quickly, to 2.9 today. Given concerns of significant bleeding at presentation, cont to cautiously titrate coumadin as tolerated. Per above, consider CT abd/pelvis if recurrent rectal bleed.  5. Advanced dementia-patient has Alzheimer's dementia. Seems to be stable currently this AM  6. Chronic COPD-no acute respiratory issue this AM, on minimal O2 support. Cont on nebs as needed  7. Myocardium avium intracellulare infection-continue home ethambutol and rifampin reportedly on this since 12/20/2018. Stable at this time  8.  Anxiety/depression-continue home Xanax, Depakote, Zyprexa, Paxil, Remeron, melatonin. Currently stable  DVT prophylaxis: Coumadin Code Status: DNR Family Communication: Pt in room, family not at bedside Disposition Plan: Uncertain at this time  Consultants:   GI  Procedures:     Antimicrobials: Anti-infectives (From admission, onward)   Start     Dose/Rate Route Frequency Ordered Stop   05/23/19 0900  ethambutol (MYAMBUTOL) tablet 1,600 mg    Note to Pharmacy: Monday, Wednesday, & Friday     1,600 mg Oral 3 times weekly 05/22/19 1829     05/23/19 0900  rifampin (RIFADIN) capsule 600 mg    Note to Pharmacy: Monday, Wednesday, & Friday     600 mg Oral 3 times weekly 05/22/19 1829        Subjective: Unable to assess given baseline dementia  Objective: Vitals:   05/30/19 1259 05/30/19 2015 05/31/19 0521 05/31/19 1358  BP: (!) 145/91 (!) 152/63 (!) 152/82 (!) 145/60  Pulse: 85 73 76 76  Resp: 16 15 17 16   Temp: 98.1 F (36.7 C) 100.2 F (37.9 C) 97.8 F (36.6 C) (!) 97.5 F (36.4 C)  TempSrc: Oral Oral Oral Oral  SpO2: 95% 99% 100% 97%  Weight:      Height:        Intake/Output Summary (Last 24 hours) at 05/31/2019 1422 Last data filed at 05/31/2019 1239 Gross per 24 hour  Intake 1276.33 ml  Output 1700 ml  Net -423.67 ml   Filed Weights   05/22/19 1812  Weight: 76.7 kg    Examination: General exam: Asleep, laying in bed, in nad Respiratory system: Normal respiratory effort, no wheezing Cardiovascular system: regular rate, s1, s2 Gastrointestinal  system: Soft, nondistended, positive BS Central nervous system: CN2-12 grossly intact, strength intact Extremities: Perfused, no clubbing Skin: Normal skin turgor, no notable skin lesions seen Psychiatry: Mood normal // no visual hallucinations   Data Reviewed: I have personally reviewed following labs and imaging studies  CBC: Recent Labs  Lab 05/27/19 1712 05/28/19 0113 05/29/19 0527 05/30/19 0922  05/31/19 0826  WBC 5.1 4.2 4.2 3.9* 3.6*  HGB 10.2* 9.7* 9.7* 10.5* 11.4*  HCT 33.4* 31.1* 30.8* 33.1* 35.9*  MCV 106.4* 103.3* 103.7* 101.5* 102.3*  PLT 144* 119* 131* 131* 123*   Basic Metabolic Panel: Recent Labs  Lab 05/26/19 0502 05/27/19 0457 05/28/19 0113 05/31/19 0826  NA 143 145 142 145  K 3.2* 4.1 3.6 3.7  CL 104 106 104 102  CO2 26 29 31  33*  GLUCOSE 85 95 99 88  BUN 11 8 6* <5*  CREATININE 0.63 0.68 0.63 0.70  CALCIUM 7.8* 8.3* 8.1* 8.5*   GFR: Estimated Creatinine Clearance: 49.9 mL/min (by C-G formula based on SCr of 0.7 mg/dL). Liver Function Tests: No results for input(s): AST, ALT, ALKPHOS, BILITOT, PROT, ALBUMIN in the last 168 hours. No results for input(s): LIPASE, AMYLASE in the last 168 hours. No results for input(s): AMMONIA in the last 168 hours. Coagulation Profile: Recent Labs  Lab 05/27/19 1515 05/28/19 0113 05/29/19 0527 05/30/19 0922 05/31/19 0826  INR 1.5* 1.6* 2.2* 2.5* 2.9*   Cardiac Enzymes: No results for input(s): CKTOTAL, CKMB, CKMBINDEX, TROPONINI in the last 168 hours. BNP (last 3 results) No results for input(s): PROBNP in the last 8760 hours. HbA1C: No results for input(s): HGBA1C in the last 72 hours. CBG: No results for input(s): GLUCAP in the last 168 hours. Lipid Profile: No results for input(s): CHOL, HDL, LDLCALC, TRIG, CHOLHDL, LDLDIRECT in the last 72 hours. Thyroid Function Tests: No results for input(s): TSH, T4TOTAL, FREET4, T3FREE, THYROIDAB in the last 72 hours. Anemia Panel: No results for input(s): VITAMINB12, FOLATE, FERRITIN, TIBC, IRON, RETICCTPCT in the last 72 hours. Sepsis Labs: No results for input(s): PROCALCITON, LATICACIDVEN in the last 168 hours.  Recent Results (from the past 240 hour(s))  SARS CORONAVIRUS 2 (TAT 6-24 HRS) Nasopharyngeal Nasopharyngeal Swab     Status: None   Collection Time: 05/22/19  4:06 PM   Specimen: Nasopharyngeal Swab  Result Value Ref Range Status   SARS  Coronavirus 2 NEGATIVE NEGATIVE Final    Comment: (NOTE) SARS-CoV-2 target nucleic acids are NOT DETECTED. The SARS-CoV-2 RNA is generally detectable in upper and lower respiratory specimens during the acute phase of infection. Negative results do not preclude SARS-CoV-2 infection, do not rule out co-infections with other pathogens, and should not be used as the sole basis for treatment or other patient management decisions. Negative results must be combined with clinical observations, patient history, and epidemiological information. The expected result is Negative. Fact Sheet for Patients: HairSlick.no Fact Sheet for Healthcare Providers: quierodirigir.com This test is not yet approved or cleared by the [place] FDA and  has been authorized for detection and/or diagnosis of SARS-CoV-2 by FDA under an Emergency Use Authorization (EUA). This EUA will remain  in effect (meaning this test can be used) for the duration of the COVID-19 declaration under Section 56 4(b)(1) of the Act, 21 U.S.C. section 360bbb-3(b)(1), unless the authorization is terminated or revoked sooner. Performed at [facility], [address], [place], [place] [address]   MRSA PCR Screening     Status: None   Collection Time:  05/22/19  6:18 PM   Specimen: Nasal Mucosa; Nasopharyngeal  Result Value Ref Range Status   MRSA by PCR NEGATIVE NEGATIVE Final    Comment:        The GeneXpert MRSA Assay (FDA approved for NASAL specimens only), is one component of a comprehensive MRSA colonization surveillance program. It is not intended to diagnose MRSA infection nor to guide or monitor treatment for MRSA infections. Performed at [facility], [address] [place], [place] [id number]      Radiology Studies: No results found.  Scheduled Meds: . ALPRAZolam  0.5 mg Oral BID  . divalproex  375 mg Oral TID  . docusate sodium   100 mg Oral BID  . ethambutol  1,600 mg Oral 3 times weekly  . influenza vaccine adjuvanted  0.5 mL Intramuscular Tomorrow-1000  . loratadine  10 mg Oral Daily  . mirtazapine  3.75 mg Oral QHS  . OLANZapine zydis  7.5 mg Oral QHS  . pantoprazole (PROTONIX) IV  40 mg Intravenous Q12H  . PARoxetine  10 mg Oral Daily  . rifampin  600 mg Oral 3 times weekly  . Warfarin - Pharmacist Dosing Inpatient   Does not apply q1800   Continuous Infusions: . sodium chloride 50 mL/hr at 05/31/19 1000     LOS: 6 days   Rickey BarbaraStephen Geoffery Aultman, MD [facility] Pager On Amion  If 7PM-7AM, please contact night-coverage 05/31/2019, 2:22 PM

## 2019-06-01 DIAGNOSIS — R1310 Dysphagia, unspecified: Secondary | ICD-10-CM

## 2019-06-01 DIAGNOSIS — I82502 Chronic embolism and thrombosis of unspecified deep veins of left lower extremity: Secondary | ICD-10-CM

## 2019-06-01 DIAGNOSIS — D638 Anemia in other chronic diseases classified elsewhere: Secondary | ICD-10-CM

## 2019-06-01 DIAGNOSIS — L89152 Pressure ulcer of sacral region, stage 2: Secondary | ICD-10-CM

## 2019-06-01 DIAGNOSIS — A310 Pulmonary mycobacterial infection: Secondary | ICD-10-CM

## 2019-06-01 DIAGNOSIS — F0390 Unspecified dementia without behavioral disturbance: Secondary | ICD-10-CM

## 2019-06-01 DIAGNOSIS — J449 Chronic obstructive pulmonary disease, unspecified: Secondary | ICD-10-CM

## 2019-06-01 DIAGNOSIS — F03C0 Unspecified dementia, severe, without behavioral disturbance, psychotic disturbance, mood disturbance, and anxiety: Secondary | ICD-10-CM

## 2019-06-01 LAB — CBC
HCT: 33.8 % — ABNORMAL LOW (ref 36.0–46.0)
Hemoglobin: 10.5 g/dL — ABNORMAL LOW (ref 12.0–15.0)
MCH: 32.1 pg (ref 26.0–34.0)
MCHC: 31.1 g/dL (ref 30.0–36.0)
MCV: 103.4 fL — ABNORMAL HIGH (ref 80.0–100.0)
Platelets: 119 10*3/uL — ABNORMAL LOW (ref 150–400)
RBC: 3.27 MIL/uL — ABNORMAL LOW (ref 3.87–5.11)
RDW: 12.7 % (ref 11.5–15.5)
WBC: 3.3 10*3/uL — ABNORMAL LOW (ref 4.0–10.5)
nRBC: 0 % (ref 0.0–0.2)

## 2019-06-01 LAB — PROTIME-INR
INR: 3.7 — ABNORMAL HIGH (ref 0.8–1.2)
INR: 3.8 — ABNORMAL HIGH (ref 0.8–1.2)
Prothrombin Time: 36.4 seconds — ABNORMAL HIGH (ref 11.4–15.2)
Prothrombin Time: 37.2 seconds — ABNORMAL HIGH (ref 11.4–15.2)

## 2019-06-01 NOTE — Progress Notes (Signed)
[place] for warfarin Indication: VTE treatment  Allergies  Allergen Reactions  . Amoxicillin Anaphylaxis and Rash  . Darvocet [Propoxyphene N-Acetaminophen] Other (See Comments)    hallucinations  . Penicillins Anaphylaxis and Rash    Has patient had a PCN reaction causing immediate rash, facial/tongue/throat swelling, SOB or lightheadedness with hypotension: Yes Has patient had a PCN reaction causing severe rash involving mucus membranes or skin necrosis: No Has patient had a PCN reaction that required hospitalization No Has patient had a PCN reaction occurring within the last 10 years: No If all of the above answers are "NO", then may proceed with Cephalosporin use.   . Sulfa Antibiotics Anaphylaxis and Rash  . Propoxyphene Other (See Comments)    hallucinations    Patient Measurements: Height: 5\' 6"  (167.6 cm) Weight: 169 lb 1.5 oz (76.7 kg) IBW/kg (Calculated) : 59.3  Vital Signs: Temp: 97.7 F (36.5 C) (09/16 0426) Temp Source: Oral (09/16 0426) BP: 166/64 (09/16 0426) Pulse Rate: 75 (09/16 0426)  Labs: Recent Labs    05/30/19 0922 05/31/19 0826 06/01/19 0507  HGB 10.5* 11.4* 10.5*  HCT 33.1* 35.9* 33.8*  PLT 131* 123* 119*  LABPROT 27.0* 29.9* 36.4*  INR 2.5* 2.9* 3.7*  CREATININE  --  0.70  --     Estimated Creatinine Clearance: 49.9 mL/min (by C-G formula based on SCr of 0.7 mg/dL).  Assessment: Pharmacy consulted to dose warfarin and heparin bridge in this 85 year old female for VTE treatment.   Patient has PMH of alzheimer's dementia, HTN, MAI infection, normocytic anemia, and history of LLE DVT for which she was taking warfarin PTA. Pt admitted to hospital on 9/6 with rectal bleeding and supratherapeutic INR of 4.1. LD of warfarin was 9/5 PTA.  Anticoagulation has been held throughout admission, but is being resumed today. Venous dopplar (9/11) shows chronic DVT in left leg. Per GI note recommendation (9/8), will  target lower end of therapeutic range for INR. Per consult, ok to resume anticoagulation per GI.   Patient is on rifampin 600 mg PO three times a week - medication has significant drug interaction with warfarin, but patient was prescribed both PTA.  _____________________________________________  Warfarin dose PTA: warfarin 7.5 mg PO daily INR: 4.1 on admission  Significant Events: -9/6 - 9/10: Warfarin held -9/11: Venous dopplar showing chronic DVT in left leg. Anticoagulation resumed  Today, 06/01/19  CBC: Hgb and Plt both slightly low but stable  INR now SUPRAtherapeutic  No signs/symptoms bleeding or bruising per nursing.  Patient on full liquid diet, 10 % of meal charted yesterday. Reduced PO intake.  Significant drug interactions: rifampin is is an inducer and can decrease serum concentrations of vitamin K antagonists. Of note, patient was on both medications PTA  Eating 10-40% of meals  Goal of Therapy:  INR 2-3 (target 2-2.5)   Plan:   Continue to hold warfarin  INR daily; CBC tomorrow morning q72 or more frequently as needed  Generally avoid Vitamin K with mild INR elevation without bleeding, but given recent rectal bleeding and implications of future bleeding (permanently stopping all anticoagulation in setting of chronic DVT), may consider giving a small dose of Vit K (i.e. 1 mg PO) if risk of DVT worsening felt to be outweighed by benefits in bleeding risk reduction  INR trend not fully clear yet; at this point would recommend holding warfarin at discharge with close INR f/u    Reuel Boom, PharmD, BCPS [phone] 06/01/2019, 1:42 PM

## 2019-06-01 NOTE — Progress Notes (Signed)
PROGRESS NOTE    Stacie Wright  MBE:[id number] DOB: 06-11-1928 DOA: 05/22/2019 PCP: Benita Stabile, MD    Brief Narrative: 85 year old female with a history of Alzheimer's dementia, anxiety, depression, COPD, left lower extremity DVT on warfarin, hypertension not on medications, MAI infection, normocytic anemia brought to ED from skilled nursing facility due to rectal bleeding. Patient has advanced dementia so unable to provide any history. No family member could be contacted by admitting physician.   Assessment & Plan:   Active Problems:   MAI (mycobacterium avium-intracellulare) infection (HCC)   Depression   Pressure injury of skin   GI bleed   Hematochezia   Chronic deep vein thrombosis (DVT) of left lower extremity (HCC)   Advanced dementia (HCC)   Anemia of chronic disease   Chronic obstructive pulmonary disease (HCC)   Dysphagia  1 rectal bleeding/hematochezia In the setting of supratherapeutic INR and positive FOBT.  Patient seen in consultation by gastroenterology with no plans for endoscopy at this time.  If patient rebleeds may need to consider CT abdomen and pelvis.  Patient's Coumadin on hold as INR supratherapeutic.  H&H stable at 10.5.  Follow.  2.  Supratherapeutic INR INR supratherapeutic.  Coumadin held.  Patient with chronic DVT in the left lower extremity.  Likely could be drug interactions with rifampin.  Repeat INR this afternoon.  Patient with no overt bleeding.  If patient starts bleeding will likely then need to reverse INR.  Follow for now and watch INR drift down.  Continue to hold Coumadin.  Pharmacy dosing Coumadin.  3.  Dysphagia Concern for dysphagia per RN.  Patient seen by speech therapy who are recommending full liquid diet which we will continue for now.  Will need outpatient follow-up with SLP.  4.  Anemia of chronic disease likely secondary to chronic blood loss See problem #1.  FOBT done was positive.  Hemoglobin currently stable at 10.5.   Patient seen by GI no endoscopy planned at this time.  Follow H&H.  Transfusion threshold hemoglobin less than 7.  5.  Chronic left lower extremity DVT Patient had presented supratherapeutic INR.  Coumadin on hold.  Repeat lower extremity Dopplers with chronic left lower extremity DVT.  It is noted per Dr.Chiu that family had agreed with continued anticoagulation.  Hemoglobin currently stable.  INR trending up currently at 3.7 from 2.9.  Patient with no overt bleeding.  Continue to hold Coumadin.  If recurrent bleeding will need CT abdomen and pelvis.  6.  Advanced dementia Patient with Alzheimer's dementia.  Currently stable.  Outpatient follow-up.  7.  Chronic COPD Stable.  Nebs as needed.  8.  Mycobacteriumium avium intracellular infection, POA Patient noted to have been on ethambutol and rifampin since 12/20/2018 which we will continue at this time.  Outpatient follow-up with ID.  9.  Depression/anxiety Stable.  Continue current regimen of Depakote, Xanax, Zyprexa, Paxil, Remeron, melatonin.  Outpatient follow-up.  10.  Mid stage II sacral ulcer, POA Continue current wound care.   DVT prophylaxis: INR supratherapeutic Code Status: DNR Family Communication: No family at bedside. Disposition Plan: Discharge back to [facility] term care at [facility] when medically stable.   Consultants:   [facility]: Dr. Orvan Falconer 05/23/2019  Procedures:   Lower extremity Dopplers 05/27/2019  Antimicrobials:   None   Subjective: Pleasantly confused.  Patient talking aloud to herself.  Denies chest pain or shortness of breath.  Denies any bleeding.  Objective: Vitals:   05/31/19 0521 05/31/19 1358 05/31/19 2102  06/01/19 0426  BP: (!) 152/82 (!) 145/60 138/63 (!) 166/64  Pulse: 76 76 63 75  Resp: 17 16 17 18   Temp: 97.8 F (36.6 C) (!) 97.5 F (36.4 C) 97.9 F (36.6 C) 97.7 F (36.5 C)  TempSrc: Oral Oral Oral Oral  SpO2: 100% 97% 100% 97%  Weight:      Height:         Intake/Output Summary (Last 24 hours) at 06/01/2019 1641 Last data filed at 06/01/2019 1400 Gross per 24 hour  Intake 1355.86 ml  Output 650 ml  Net 705.86 ml   Filed Weights   05/22/19 1812  Weight: 76.7 kg    Examination:  General exam: Appears calm and comfortable  Respiratory system: Clear to auscultation bilaterally.  No wheezes, no crackles, no rhonchi.Marland Kitchen. Respiratory effort normal. Cardiovascular system: S1 & S2 heard, RRR. No JVD, murmurs, rubs, gallops or clicks. No pedal edema. Gastrointestinal system: Abdomen is nondistended, soft and nontender. No organomegaly or masses felt. Normal bowel sounds heard. Central nervous system: Alert and confused.. No focal neurological deficits. Extremities: Symmetric 5 x 5 power. Skin: No rashes, lesions or ulcers Psychiatry: Judgement and insight appear poor. Mood & affect appropriate.     Data Reviewed: I have personally reviewed following labs and imaging studies  CBC: Recent Labs  Lab 05/28/19 0113 05/29/19 0527 05/30/19 0922 05/31/19 0826 06/01/19 0507  WBC 4.2 4.2 3.9* 3.6* 3.3*  HGB 9.7* 9.7* 10.5* 11.4* 10.5*  HCT 31.1* 30.8* 33.1* 35.9* 33.8*  MCV 103.3* 103.7* 101.5* 102.3* 103.4*  PLT 119* 131* 131* 123* 119*   Basic Metabolic Panel: Recent Labs  Lab 05/26/19 0502 05/27/19 0457 05/28/19 0113 05/31/19 0826  NA 143 145 142 145  K 3.2* 4.1 3.6 3.7  CL 104 106 104 102  CO2 26 29 31  33*  GLUCOSE 85 95 99 88  BUN 11 8 6* <5*  CREATININE 0.63 0.68 0.63 0.70  CALCIUM 7.8* 8.3* 8.1* 8.5*   GFR: Estimated Creatinine Clearance: 49.9 mL/min (by C-G formula based on SCr of 0.7 mg/dL). Liver Function Tests: No results for input(s): AST, ALT, ALKPHOS, BILITOT, PROT, ALBUMIN in the last 168 hours. No results for input(s): LIPASE, AMYLASE in the last 168 hours. No results for input(s): AMMONIA in the last 168 hours. Coagulation Profile: Recent Labs  Lab 05/28/19 0113 05/29/19 0527 05/30/19 0922 05/31/19 0826  06/01/19 0507  INR 1.6* 2.2* 2.5* 2.9* 3.7*   Cardiac Enzymes: No results for input(s): CKTOTAL, CKMB, CKMBINDEX, TROPONINI in the last 168 hours. BNP (last 3 results) No results for input(s): PROBNP in the last 8760 hours. HbA1C: No results for input(s): HGBA1C in the last 72 hours. CBG: No results for input(s): GLUCAP in the last 168 hours. Lipid Profile: No results for input(s): CHOL, HDL, LDLCALC, TRIG, CHOLHDL, LDLDIRECT in the last 72 hours. Thyroid Function Tests: No results for input(s): TSH, T4TOTAL, FREET4, T3FREE, THYROIDAB in the last 72 hours. Anemia Panel: No results for input(s): VITAMINB12, FOLATE, FERRITIN, TIBC, IRON, RETICCTPCT in the last 72 hours. Sepsis Labs: No results for input(s): PROCALCITON, LATICACIDVEN in the last 168 hours.  Recent Results (from the past 240 hour(s))  MRSA PCR Screening     Status: None   Collection Time: 05/22/19  6:18 PM   Specimen: Nasal Mucosa; Nasopharyngeal  Result Value Ref Range Status   MRSA by PCR NEGATIVE NEGATIVE Final    Comment:        The GeneXpert MRSA Assay (FDA approved for NASAL specimens  only), is one component of a comprehensive MRSA colonization surveillance program. It is not intended to diagnose MRSA infection nor to guide or monitor treatment for MRSA infections. Performed at [facility], [place] [address], [place], [place] [address]          Radiology Studies: No results found.      Scheduled Meds: . ALPRAZolam  0.5 mg Oral BID  . divalproex  375 mg Oral TID  . docusate sodium  100 mg Oral BID  . ethambutol  1,600 mg Oral 3 times weekly  . influenza vaccine adjuvanted  0.5 mL Intramuscular Tomorrow-1000  . loratadine  10 mg Oral Daily  . mirtazapine  3.75 mg Oral QHS  . OLANZapine zydis  7.5 mg Oral QHS  . pantoprazole (PROTONIX) IV  40 mg Intravenous Q12H  . PARoxetine  10 mg Oral Daily  . rifampin  600 mg Oral 3 times weekly  . Warfarin - Pharmacist Dosing  Inpatient   Does not apply q1800   Continuous Infusions: . sodium chloride 50 mL/hr at 06/01/19 1400     LOS: 7 days    Time spent: 35 minutes    Irine Seal, MD [facility]  If 7PM-7AM, please contact night-coverage [email or web address] 06/01/2019, 4:41 PM

## 2019-06-02 LAB — CBC WITH DIFFERENTIAL/PLATELET
Abs Immature Granulocytes: 0.02 10*3/uL (ref 0.00–0.07)
Basophils Absolute: 0 10*3/uL (ref 0.0–0.1)
Basophils Relative: 0 %
Eosinophils Absolute: 0.2 10*3/uL (ref 0.0–0.5)
Eosinophils Relative: 4 %
HCT: 32.6 % — ABNORMAL LOW (ref 36.0–46.0)
Hemoglobin: 10.3 g/dL — ABNORMAL LOW (ref 12.0–15.0)
Immature Granulocytes: 0 %
Lymphocytes Relative: 30 %
Lymphs Abs: 1.5 10*3/uL (ref 0.7–4.0)
MCH: 32.6 pg (ref 26.0–34.0)
MCHC: 31.6 g/dL (ref 30.0–36.0)
MCV: 103.2 fL — ABNORMAL HIGH (ref 80.0–100.0)
Monocytes Absolute: 0.6 10*3/uL (ref 0.1–1.0)
Monocytes Relative: 12 %
Neutro Abs: 2.7 10*3/uL (ref 1.7–7.7)
Neutrophils Relative %: 54 %
Platelets: 126 10*3/uL — ABNORMAL LOW (ref 150–400)
RBC: 3.16 MIL/uL — ABNORMAL LOW (ref 3.87–5.11)
RDW: 12.8 % (ref 11.5–15.5)
WBC: 5.1 10*3/uL (ref 4.0–10.5)
nRBC: 0 % (ref 0.0–0.2)

## 2019-06-02 LAB — BASIC METABOLIC PANEL
Anion gap: 11 (ref 5–15)
BUN: 8 mg/dL (ref 8–23)
CO2: 32 mmol/L (ref 22–32)
Calcium: 8.3 mg/dL — ABNORMAL LOW (ref 8.9–10.3)
Chloride: 102 mmol/L (ref 98–111)
Creatinine, Ser: 0.84 mg/dL (ref 0.44–1.00)
GFR calc Af Amer: >60 mL/min (ref >60)
GFR calc non Af Amer: >60 mL/min (ref >60)
Glucose, Bld: 95 mg/dL (ref 70–99)
Potassium: 3.6 mmol/L (ref 3.5–5.1)
Sodium: 145 mmol/L (ref 135–145)

## 2019-06-02 LAB — PROTIME-INR
INR: 3.4 — ABNORMAL HIGH (ref 0.8–1.2)
Prothrombin Time: 34.1 seconds — ABNORMAL HIGH (ref 11.4–15.2)

## 2019-06-02 NOTE — Care Management Important Message (Signed)
Important Message  Patient Details IM Letter given to Dessa Phi RN to present to the Patient Name: Stacie Wright MRN: [id number] Date of Birth: March 15, 1928   Medicare Important Message Given:  Yes     Kerin Salen 06/02/2019, 10:31 AM

## 2019-06-02 NOTE — Progress Notes (Signed)
PROGRESS NOTE    Stacie Wright  ELF:[id number] DOB: September 03, 1928 DOA: 05/22/2019 PCP: Celene Squibb, MD    Brief Narrative: 85 year old female with a history of Alzheimer's dementia, anxiety, depression, COPD, left lower extremity DVT on warfarin, hypertension not on medications, MAI infection, normocytic anemia brought to ED from skilled nursing facility due to rectal bleeding. Patient has advanced dementia so unable to provide any history. No family member could be contacted by admitting physician.   Assessment & Plan:   Active Problems:   MAI (mycobacterium avium-intracellulare) infection ([place])   Depression   Pressure injury of skin   GI bleed   Hematochezia   Chronic deep vein thrombosis (DVT) of left lower extremity (HCC)   Advanced dementia (HCC)   Anemia of chronic disease   Chronic obstructive pulmonary disease (HCC)   Dysphagia  1 rectal bleeding/hematochezia In the setting of supratherapeutic INR and positive FOBT.  Patient seen in consultation by gastroenterology with no plans for endoscopy at this time.  If patient rebleeds may need to consider CT abdomen and pelvis.  Patient's Coumadin on hold as INR supratherapeutic.  H&H stable at 10.3.  Follow.  2.  Supratherapeutic INR INR supratherapeutic.  Coumadin held.  Patient with chronic DVT in the left lower extremity.  Likely could be drug interactions with rifampin.  INR was trending up subsequently trending back down.  INR currently at 3.4 from 3.7 from 2.9. Patient with no overt bleeding.  If patient starts bleeding will likely then need to reverse INR.  Follow for now and watch INR drift down.  Continue to hold Coumadin.  Pharmacy dosing Coumadin.  3.  Dysphagia Concern for dysphagia per RN.  Patient seen by speech therapy who are recommending full liquid diet which we will continue for now.  Will need outpatient follow-up with SLP.  4.  Anemia of chronic disease likely secondary to chronic blood loss See problem #1.   FOBT done was positive.  Hemoglobin currently stable at 10.3.  Patient seen by GI no endoscopy planned at this time.  Follow H&H.  Transfusion threshold hemoglobin less than 7.  5.  Chronic left lower extremity DVT Patient had presented supratherapeutic INR.  Coumadin on hold.  Repeat lower extremity Dopplers with chronic left lower extremity DVT.  It is noted per Dr.Chiu that family had agreed with continued anticoagulation.  Hemoglobin currently stable.  INR trending back down and currently at 3.4 from 3.7 from 2.9.  Patient with no overt bleeding.  Continue to hold Coumadin.  If recurrent bleeding will need CT abdomen and pelvis.  If INR still remains supratherapeutic on discharge will likely need to discharge of Coumadin with close PT/INR follow-up and further Coumadin recommendations in the outpatient setting.  6.  Advanced dementia Patient with Alzheimer's dementia.  Outpatient follow-up.  7.  Chronic COPD Nebs as needed.    8.  Mycobacteriumium avium intracellular infection, POA Patient noted to have been on ethambutol and rifampin since 12/20/2018 which we will continue at this time.  Outpatient follow-up with ID.  9.  Depression/anxiety Continue current regimen of Depakote, Xanax, Zyprexa, Paxil, Remeron, melatonin.  Outpatient follow-up.  10.  Mid stage II sacral ulcer, POA Continue current wound care.   DVT prophylaxis: INR supratherapeutic Code Status: DNR Family Communication: No family at bedside. Disposition Plan: Discharge back to long term care at [facility] when medically stable and once COVID results are negative hopefully in 24 hours..   Consultants:   [facility]: Dr. Tarri Glenn  05/23/2019  Procedures:   Lower extremity Dopplers 05/27/2019  Antimicrobials:   None   Subjective: Patient asleep.  Resting comfortably.  Per RN no signs of bleeding noted.   Objective: Vitals:   05/31/19 2102 06/01/19 0426 06/01/19 1951 06/02/19 0648  BP: 138/63 (!) 166/64  (!) 153/81 (!) 143/65  Pulse: 63 75 89 67  Resp: 17 18 18 18   Temp: 97.9 F (36.6 C) 97.7 F (36.5 C) 98.7 F (37.1 C) 98.8 F (37.1 C)  TempSrc: Oral Oral  Oral  SpO2: 100% 97% 96% 98%  Weight:      Height:        Intake/Output Summary (Last 24 hours) at 06/02/2019 1203 Last data filed at 06/02/2019 0649 Gross per 24 hour  Intake 501.98 ml  Output 250 ml  Net 251.98 ml   Filed Weights   05/22/19 1812  Weight: 76.7 kg    Examination:  General exam: NAD. Respiratory system: Lungs clear to auscultation anterior lung fields.  No wheezes, no crackles, no rhonchi.  Normal respiratory effort.  Cardiovascular system: RRR no murmurs rubs or gallops.  No JVD.  No lower extremity edema.  Gastrointestinal system: Abdomen is soft, nontender, nondistended, positive bowel sounds.  No rebound.  No guarding.  Central nervous system: Alert and confused.. No focal neurological deficits. Extremities: Symmetric 5 x 5 power. Skin: No rashes, lesions or ulcers Psychiatry: Judgement and insight appear poor. Mood & affect appropriate.     Data Reviewed: I have personally reviewed following labs and imaging studies  CBC: Recent Labs  Lab 05/29/19 0527 05/30/19 0922 05/31/19 0826 06/01/19 0507 06/02/19 0440  WBC 4.2 3.9* 3.6* 3.3* 5.1  NEUTROABS  --   --   --   --  2.7  HGB 9.7* 10.5* 11.4* 10.5* 10.3*  HCT 30.8* 33.1* 35.9* 33.8* 32.6*  MCV 103.7* 101.5* 102.3* 103.4* 103.2*  PLT 131* 131* 123* 119* 126*   Basic Metabolic Panel: Recent Labs  Lab 05/27/19 0457 05/28/19 0113 05/31/19 0826 06/02/19 0440  NA 145 142 145 145  K 4.1 3.6 3.7 3.6  CL 106 104 102 102  CO2 29 31 33* 32  GLUCOSE 95 99 88 95  BUN 8 6* <5* 8  CREATININE 0.68 0.63 0.70 0.84  CALCIUM 8.3* 8.1* 8.5* 8.3*   GFR: Estimated Creatinine Clearance: 47.5 mL/min (by C-G formula based on SCr of 0.84 mg/dL). Liver Function Tests: No results for input(s): AST, ALT, ALKPHOS, BILITOT, PROT, ALBUMIN in the last 168  hours. No results for input(s): LIPASE, AMYLASE in the last 168 hours. No results for input(s): AMMONIA in the last 168 hours. Coagulation Profile: Recent Labs  Lab 05/30/19 0922 05/31/19 0826 06/01/19 0507 06/01/19 1558 06/02/19 0440  INR 2.5* 2.9* 3.7* 3.8* 3.4*   Cardiac Enzymes: No results for input(s): CKTOTAL, CKMB, CKMBINDEX, TROPONINI in the last 168 hours. BNP (last 3 results) No results for input(s): PROBNP in the last 8760 hours. HbA1C: No results for input(s): HGBA1C in the last 72 hours. CBG: No results for input(s): GLUCAP in the last 168 hours. Lipid Profile: No results for input(s): CHOL, HDL, LDLCALC, TRIG, CHOLHDL, LDLDIRECT in the last 72 hours. Thyroid Function Tests: No results for input(s): TSH, T4TOTAL, FREET4, T3FREE, THYROIDAB in the last 72 hours. Anemia Panel: No results for input(s): VITAMINB12, FOLATE, FERRITIN, TIBC, IRON, RETICCTPCT in the last 72 hours. Sepsis Labs: No results for input(s): PROCALCITON, LATICACIDVEN in the last 168 hours.  No results found for this or any previous  visit (from the past 240 hour(s)).       Radiology Studies: No results found.      Scheduled Meds:  ALPRAZolam  0.5 mg Oral BID   divalproex  375 mg Oral TID   docusate sodium  100 mg Oral BID   ethambutol  1,600 mg Oral 3 times weekly   influenza vaccine adjuvanted  0.5 mL Intramuscular Tomorrow-1000   loratadine  10 mg Oral Daily   mirtazapine  3.75 mg Oral QHS   OLANZapine zydis  7.5 mg Oral QHS   pantoprazole (PROTONIX) IV  40 mg Intravenous Q12H   PARoxetine  10 mg Oral Daily   rifampin  600 mg Oral 3 times weekly   Warfarin - Pharmacist Dosing Inpatient   Does not apply q1800   Continuous Infusions:    LOS: 8 days    Time spent: 35 minutes    Ramiro Harvest, MD [facility]  If 7PM-7AM, please contact night-coverage [email or web address] 06/02/2019, 12:03 PM

## 2019-06-02 NOTE — Progress Notes (Signed)
[place] for warfarin Indication: VTE treatment  Allergies  Allergen Reactions  . Amoxicillin Anaphylaxis and Rash  . Darvocet [Propoxyphene N-Acetaminophen] Other (See Comments)    hallucinations  . Penicillins Anaphylaxis and Rash    Has patient had a PCN reaction causing immediate rash, facial/tongue/throat swelling, SOB or lightheadedness with hypotension: Yes Has patient had a PCN reaction causing severe rash involving mucus membranes or skin necrosis: No Has patient had a PCN reaction that required hospitalization No Has patient had a PCN reaction occurring within the last 10 years: No If all of the above answers are "NO", then may proceed with Cephalosporin use.   . Sulfa Antibiotics Anaphylaxis and Rash  . Propoxyphene Other (See Comments)    hallucinations    Patient Measurements: Height: 5\' 6"  (167.6 cm) Weight: 169 lb 1.5 oz (76.7 kg) IBW/kg (Calculated) : 59.3  Vital Signs: Temp: 98.8 F (37.1 C) (09/17 0648) Temp Source: Oral (09/17 0648) BP: 143/65 (09/17 0648) Pulse Rate: 67 (09/17 0648)  Labs: Recent Labs    05/31/19 0826 06/01/19 0507 06/01/19 1558 06/02/19 0440  HGB 11.4* 10.5*  --  10.3*  HCT 35.9* 33.8*  --  32.6*  PLT 123* 119*  --  126*  LABPROT 29.9* 36.4* 37.2* 34.1*  INR 2.9* 3.7* 3.8* 3.4*  CREATININE 0.70  --   --  0.84    Estimated Creatinine Clearance: 47.5 mL/min (by C-G formula based on SCr of 0.84 mg/dL).  Assessment: Pharmacy consulted to dose warfarin and heparin bridge in this 85 year old female for VTE treatment.   Patient has PMH of alzheimer's dementia, HTN, MAI infection, normocytic anemia, and history of LLE DVT for which she was taking warfarin PTA. Pt admitted to hospital on 9/6 with rectal bleeding and supratherapeutic INR of 4.1. LD of warfarin was 9/5 PTA.  Anticoagulation has been held throughout admission, but is being resumed today. Venous dopplar (9/11) shows chronic DVT in  left leg. Per GI note recommendation (9/8), will target lower end of therapeutic range for INR. Per consult, ok to resume anticoagulation per GI.   Patient is on rifampin 600 mg PO three times a week - medication has significant drug interaction with warfarin, but patient was prescribed both PTA.  _____________________________________________  Warfarin dose PTA: warfarin 7.5 mg PO daily INR: 4.1 on admission  Significant Events: -9/6 - 9/10: Warfarin held -9/11: Venous dopplar showing chronic DVT in left leg. Anticoagulation resumed  Today, 06/02/19  CBC: Hgb and Plt both slightly low but stable  INR remains SUPRAtherapeutic despite holding warfarin x2 days  No signs/symptoms bleeding or bruising per nursing.  Significant drug interactions: rifampin is is an inducer and can decrease serum concentrations of vitamin K antagonists. Of note, patient was on both medications PTA  Eating <10% of meals  Goal of Therapy:  INR 2-3 (target 2-2.5)   Plan:   Continue to hold warfarin  INR daily; CBC q72 or more frequently as needed  Consider small dose of Vit K (1 mg PO x1) if bleeding occurs  INR trend not fully clear yet; at this point would recommend holding warfarin at discharge with close INR f/u    Reuel Boom, PharmD, BCPS [phone] 06/02/2019, 2:18 PM

## 2019-06-02 NOTE — Progress Notes (Signed)
Speech Language Pathology Discharge Patient Details Name: Stacie Wright MRN: [id number] DOB: [birth year] Today's Date: 06/02/2019 Time:  -     Patient discharged from SLP services secondary to pt stable on full liquids.  Please see latest therapy progress note for current level of functioning and progress toward goals.    Progress and discharge plan discussed with patient and/or caregiver: Patient unable to participate in discharge planning and no caregivers available  GO     Macario Golds 06/02/2019, 3:40 PM

## 2019-06-03 DIAGNOSIS — I825Y2 Chronic embolism and thrombosis of unspecified deep veins of left proximal lower extremity: Secondary | ICD-10-CM

## 2019-06-03 DIAGNOSIS — E876 Hypokalemia: Secondary | ICD-10-CM

## 2019-06-03 LAB — NOVEL CORONAVIRUS, NAA (HOSP ORDER, SEND-OUT TO REF LAB; TAT 18-24 HRS): SARS-CoV-2, NAA: NOT DETECTED

## 2019-06-03 LAB — BASIC METABOLIC PANEL
Anion gap: 11 (ref 5–15)
BUN: 9 mg/dL (ref 8–23)
CO2: 33 mmol/L — ABNORMAL HIGH (ref 22–32)
Calcium: 8.2 mg/dL — ABNORMAL LOW (ref 8.9–10.3)
Chloride: 103 mmol/L (ref 98–111)
Creatinine, Ser: 0.68 mg/dL (ref 0.44–1.00)
GFR calc Af Amer: >60 mL/min (ref >60)
GFR calc non Af Amer: >60 mL/min (ref >60)
Glucose, Bld: 90 mg/dL (ref 70–99)
Potassium: 3.1 mmol/L — ABNORMAL LOW (ref 3.5–5.1)
Sodium: 147 mmol/L — ABNORMAL HIGH (ref 135–145)

## 2019-06-03 LAB — PROTIME-INR
INR: 2.3 — ABNORMAL HIGH (ref 0.8–1.2)
Prothrombin Time: 24.8 seconds — ABNORMAL HIGH (ref 11.4–15.2)

## 2019-06-03 LAB — CBC
HCT: 33.9 % — ABNORMAL LOW (ref 36.0–46.0)
Hemoglobin: 10.5 g/dL — ABNORMAL LOW (ref 12.0–15.0)
MCH: 31.8 pg (ref 26.0–34.0)
MCHC: 31 g/dL (ref 30.0–36.0)
MCV: 102.7 fL — ABNORMAL HIGH (ref 80.0–100.0)
Platelets: 121 10*3/uL — ABNORMAL LOW (ref 150–400)
RBC: 3.3 MIL/uL — ABNORMAL LOW (ref 3.87–5.11)
RDW: 12.5 % (ref 11.5–15.5)
WBC: 4.1 10*3/uL (ref 4.0–10.5)
nRBC: 0 % (ref 0.0–0.2)

## 2019-06-03 MED ORDER — POTASSIUM CHLORIDE 20 MEQ PO PACK
40.0000 meq | PACK | ORAL | Status: AC
  Administered 2019-06-03 (×2): 40 meq via ORAL
  Filled 2019-06-03 (×2): qty 2

## 2019-06-03 MED ORDER — FUROSEMIDE 10 MG/ML IJ SOLN: 20.0000 mg | Freq: Once | INTRAMUSCULAR | Status: DC

## 2019-06-03 MED ORDER — WARFARIN SODIUM 2 MG PO TABS
2.0000 mg | ORAL_TABLET | Freq: Once | ORAL | Status: AC
  Administered 2019-06-03: 2 mg via ORAL
  Filled 2019-06-03: qty 1

## 2019-06-03 MED ORDER — POTASSIUM CHLORIDE 2 MEQ/ML IV SOLN
INTRAVENOUS | Status: DC
  Administered 2019-06-03: 09:00:00 via INTRAVENOUS
  Filled 2019-06-03: qty 1000

## 2019-06-03 MED ORDER — WARFARIN SODIUM 2 MG PO TABS: 2.0000 mg | ORAL_TABLET | Freq: Every evening | ORAL | 0 refills | Status: AC

## 2019-06-03 NOTE — TOC Transition Note (Signed)
Transition of Care [facility]) - CM/SW Discharge Note   Patient Details  Name: Stacie Wright MRN: [id number] Date of Birth: 11-12-1927  Transition of Care [facility]) CM/SW Contact:  Dessa Phi, RN Phone Number: 06/03/2019, 3:02 PM   Clinical Narrative: d/c today returning back to [facility] spoke to [place], nurse call report [phone]. Louie Casa son aware.[facility] called for 4p pick up.      Final next level of care: Skilled Nursing Facility Barriers to Discharge: No Barriers Identified   Patient Goals and CMS Choice        Discharge Placement              Patient chooses bed at: [facility] Patient to be transferred to facility by: [place] Name of family member notified: Louie Casa son Patient and family notified of of transfer: 06/03/19  Discharge Plan and Services                                     Social Determinants of Health (SDOH) Interventions     Readmission Risk Interventions No flowsheet data found.

## 2019-06-03 NOTE — Progress Notes (Signed)
Pt discharged back to [facility] today per Dr. Grandville Silos. Pt's IV sites d/c'd and WDL. Pt's VSS. Report called to Lattie Haw, nurse at [place]. Verbalized understanding, all questions answered. Currently awaiting arrival of [facility] for transport.

## 2019-06-03 NOTE — Progress Notes (Signed)
Attempted to wean to room air. O2 sat decreased to 86 on room air. 2L nasal cannula applied, O2 sat now 94%.

## 2019-06-03 NOTE — Discharge Summary (Addendum)
Physician Discharge Summary  Stacie Wright WJX:[id number] DOB: [birth year] DOA: 05/22/2019  PCP: Benita Stabile, MD  Admit date: 05/22/2019 Discharge date: 06/03/2019  Time spent: 55 minutes  Recommendations for Outpatient Follow-up:  1. Follow-up with MD at skilled nursing facility.  Patient will need a basic metabolic profile done in 1 week to follow-up on electrolytes and renal function.  Patient also need PT/INR checked on Monday, 06/06/2019 and further recommendations at that time. 2. Follow-up with PT/INR check on Monday, 06/06/2019.  Further recommendations per MD at skilled nursing facility or Coumadin clinic.  Patient is being discharged on Coumadin 2 mg daily until INR follow-up. 3. Follow-up with speech therapy for further recommendations on diet advancement.  Patient being discharged on full liquid diet.   Discharge Diagnoses:  Principal Problem:   Hematochezia Active Problems:   MAI (mycobacterium avium-intracellulare) infection (HCC)   Depression   Dementia in Alzheimer's disease (HCC)   Supratherapeutic INR   Pressure injury of skin   GI bleed   Chronic deep vein thrombosis (DVT) of left lower extremity (HCC)   Advanced dementia (HCC)   Anemia of chronic disease   Chronic obstructive pulmonary disease (HCC)   Dysphagia   Discharge Condition: Stable and improved  Diet recommendation: Full liquid diet.  Filed Weights   05/22/19 1812 06/03/19 1112  Weight: 76.7 kg 77 kg    History of present illness:  HPI per Dr. Carollee Herter is a 85 y.o. female with history of Alzheimer's dementia, anxiety, depression, COPD, LLE DVT on warfarin, HTN not on medication, Mycobacterium avium-intracellulare infection and normocytic anemia brought to ED from SNF with complaints of rectal bleed.   Patient has advanced dementia and not able to provide history.  Patient is awake and alert but oriented only to self.  She thought she was in [place].  Followed some commands but did  not respond to question appropriately.  Attempted to call family members including spouse and 2 sons using all the phone number's listed in the chart but no answer.   Called and talked to Fritz Pickerel, Charity fundraiser at [facility].  Per Ms. Thomas, nursing tech noticed some blood in his stool during diaper change.  Otherwise, patient has been in her usual state of health.  Ms. Maisie Fus states that she has been on TB medication since 12/20/2018.  She is also on warfarin for over a year. Per Ms. Maisie Fus, patient is DNR.  In ED, hemodynamically stable.  CMP not impressive.  Hgb 10.5 (at baseline).  MCV 105.  INR 4.1.  FOBT positive.  COVID-19 pending.  Hospitalist service called for admission for GI bleed.  I have requested EDP to consult GI as patient might need clearance to resume warfarin.  Hospital Course:  1 rectal bleeding/hematochezia In the setting of supratherapeutic INR and positive FOBT.  Patient seen in consultation by gastroenterology with no plans for endoscopy at this time.  Patient had no further bleeding during the hospitalization.  Patient's hemoglobin remained stable at 10.5 by day of discharge.  Patient's Coumadin was held as INR was supratherapeutic.  INR drifted down such that by day of discharge INR was 2.3.  Patient will be resumed back on Coumadin 2 mg daily with close outpatient follow-up.   2.  Supratherapeutic INR INR supratherapeutic during the hospitalization.  Coumadin held.  Patient with chronic DVT in the left lower extremity.  Likely could be drug interactions with rifampin.  INR was trending up subsequently trending back down.  INR was 2.3 by day of discharge from 3.4 from 3.7 from 2.9. Patient with no overt bleeding.  Patient be discharged on Coumadin 2 mg daily with repeat PT/INR done on Monday, 06/06/2019.  3.  Dysphagia Concern for dysphagia per RN.  Patient seen by speech therapy who are recommending full liquid diet which patient was maintained on.  Patient will  need outpatient follow-up with speech therapy.   4.  Anemia of chronic disease likely secondary to chronic blood loss See problem #1.  FOBT done was positive.  Hemoglobin remained stable and was 10.5 by day of discharge.  Patient seen by GI no endoscopy planned at this time.   5.  Chronic left lower extremity DVT Patient had presented supratherapeutic INR.  Coumadin was initially held throughout most of the hospitalization.  Repeat lower extremity Dopplers with chronic left lower extremity DVT.  It was noted per Dr.Chiu that family had agreed with continued anticoagulation.  Hemoglobin remained stable. INR trended back down and was at 2.3 by day of discharge from 3.4 from 3.7 from 2.9.  Patient with no overt bleeding.    Patient will be resumed back on Coumadin 2 mg daily and patient will need repeat PT/INR done on Monday, 06/06/2019.  Further outpatient recommendations per Coumadin clinic or MD at skilled nursing facility.   6.  Advanced dementia Patient with Alzheimer's dementia.  Patient maintained on home regimen of Zyprexa nightly. Outpatient follow-up.  7.  Chronic COPD Remained stable throughout the hospitalization.  Patient was maintained on nebs as needed.    8.  Mycobacteriumium avium intracellular infection, POA Patient noted to have been on ethambutol and rifampin since 12/20/2018 which was continued throughout the hospitalization.  Outpatient follow-up with ID as previously scheduled.   9.  Depression/anxiety Patient was maintained on home regimen of Depakote, Xanax, Zyprexa, Paxil, Remeron, melatonin.  Outpatient follow-up.  10.  Mid stage II sacral ulcer, POA Patient was maintained on wound care during the hospitalization.  Outpatient follow-up.  11.  Hypokalemia Repleted.  Outpatient follow-up.   Procedures:  Lower extremity Dopplers 05/27/2019  Consultations:  [place] gastroenterology: Dr. Orvan Falconerbeavers 05/23/2019  Discharge Exam: Vitals:   06/03/19 1157 06/03/19  1337  BP:  (!) 149/56  Pulse:  74  Resp:  20  Temp:  98.2 F (36.8 C)  SpO2: 94% 94%    General: NAD Cardiovascular: RRR Respiratory: CTAB  Discharge Instructions   Discharge Instructions    Diet full liquid   Complete by: As directed    Increase activity slowly   Complete by: As directed      Allergies as of 06/03/2019      Reactions   Amoxicillin Anaphylaxis, Rash   Darvocet [propoxyphene N-acetaminophen] Other (See Comments)   hallucinations   Penicillins Anaphylaxis, Rash   Has patient had a PCN reaction causing immediate rash, facial/tongue/throat swelling, SOB or lightheadedness with hypotension: Yes Has patient had a PCN reaction causing severe rash involving mucus membranes or skin necrosis: No Has patient had a PCN reaction that required hospitalization No Has patient had a PCN reaction occurring within the last 10 years: No If all of the above answers are "NO", then may proceed with Cephalosporin use.   Sulfa Antibiotics Anaphylaxis, Rash   Propoxyphene Other (See Comments)   hallucinations      Medication List    STOP taking these medications   Melatonin 3 MG Tabs     TAKE these medications   acetaminophen 500 MG tablet Commonly known as:  TYLENOL Take 1 tablet (500 mg total) by mouth every 6 (six) hours as needed for mild pain, fever or headache.   ALPRAZolam 0.5 MG tablet Commonly known as: XANAX Take 1 tablet (0.5 mg total) by mouth 2 (two) times daily. What changed:   how much to take  when to take this  reasons to take this  Another medication with the same name was removed. Continue taking this medication, and follow the directions you see here.   chlorhexidine 0.12 % solution Commonly known as: PERIDEX Use as directed 15 mLs in the mouth or throat at bedtime.   Delsym Cough Childrens 30 MG/5ML liquid Generic drug: dextromethorphan Take 10 mLs by mouth 2 (two) times daily.   diclofenac sodium 1 % Gel Commonly known as:  VOLTAREN Apply 2 g topically 4 (four) times daily.   divalproex 125 MG capsule Commonly known as: DEPAKOTE SPRINKLE Take 2 capsules (250 mg total) by mouth 3 (three) times daily. What changed: how much to take   ethambutol 400 MG tablet Commonly known as: MYAMBUTOL Take 1,600 mg by mouth 3 (three) times a week. Monday, Wednesday, & Friday   ipratropium-albuterol 0.5-2.5 (3) MG/3ML Soln Commonly known as: DUONEB Take 3 mLs by nebulization every 6 (six) hours as needed (SOB).   loratadine 10 MG tablet Commonly known as: CLARITIN Take 10 mg by mouth daily.   Menthol-Zinc Oxide 0.4-20.5 % Pste Apply 1 application topically 2 (two) times daily.   mirtazapine 7.5 MG tablet Commonly known as: REMERON Take 3.75 mg by mouth at bedtime.   OLANZapine zydis 5 MG disintegrating tablet Commonly known as: ZYPREXA Take 1 tablet (5 mg total) by mouth 2 (two) times daily. What changed:   how much to take  when to take this   PARoxetine 20 MG tablet Commonly known as: PAXIL Take 10 mg by mouth daily.   PreviDent 5000 Plus 1.1 % Crea dental cream Generic drug: sodium fluoride Place 1 application onto teeth every evening.   rifampin 300 MG capsule Commonly known as: RIFADIN Take 600 mg by mouth 3 (three) times a week. Monday, Wednesday, & Friday   warfarin 2 MG tablet Commonly known as: COUMADIN Take 1 tablet (2 mg total) by mouth every evening. What changed:   medication strength  how much to take   Zinc-220 220 (50 Zn) MG capsule Generic drug: zinc sulfate Take 220 mg by mouth daily.      Allergies  Allergen Reactions  . Amoxicillin Anaphylaxis and Rash  . Darvocet [Propoxyphene N-Acetaminophen] Other (See Comments)    hallucinations  . Penicillins Anaphylaxis and Rash    Has patient had a PCN reaction causing immediate rash, facial/tongue/throat swelling, SOB or lightheadedness with hypotension: Yes Has patient had a PCN reaction causing severe rash involving mucus  membranes or skin necrosis: No Has patient had a PCN reaction that required hospitalization No Has patient had a PCN reaction occurring within the last 10 years: No If all of the above answers are "NO", then may proceed with Cephalosporin use.   . Sulfa Antibiotics Anaphylaxis and Rash  . Propoxyphene Other (See Comments)    hallucinations   Follow-up Information    MD AT SNF Follow up.        COUMADIN CHECK Follow up on 06/06/2019.   Why: pt/inr           The results of significant diagnostics from this hospitalization (including imaging, microbiology, ancillary and laboratory) are listed below for reference.  Significant Diagnostic Studies: Vas [place] Lower Extremity Venous (dvt)  Result Date: 05/28/2019  Lower Venous Study Indications: F/U from DVT of 2008 to discontinue blood thinners.  Risk Factors: DVT Lt le 2008. Limitations: Patient unable to move legs. Comparison Study: 2008 Positive lt junction of fv and profunda occluded,                   popliteal occlusion, ptv occlusion. Inconplete occlusion of                   GSV Performing Technologist: [employer]  Examination Guidelines: A complete evaluation includes B-mode imaging, spectral Doppler, color Doppler, and power Doppler as needed of all accessible portions of each vessel. Bilateral testing is considered an integral part of a complete examination. Limited examinations for reoccurring indications may be performed as noted.  +---------+---------------+---------+-----------+----------+-------------------+ RIGHT    CompressibilityPhasicitySpontaneityPropertiesThrombus Aging      +---------+---------------+---------+-----------+----------+-------------------+ CFV      Full           Yes      Yes                                      +---------+---------------+---------+-----------+----------+-------------------+ SFJ      Full                                                              +---------+---------------+---------+-----------+----------+-------------------+ FV Prox  Full           Yes      Yes                                      +---------+---------------+---------+-----------+----------+-------------------+ FV Mid   Full                                                             +---------+---------------+---------+-----------+----------+-------------------+ FV DistalFull           Yes      Yes                                      +---------+---------------+---------+-----------+----------+-------------------+ PFV      Full           Yes      Yes                                      +---------+---------------+---------+-----------+----------+-------------------+ POP                     Yes                           Unable to compress  due to position of                                                        the leg             +---------+---------------+---------+-----------+----------+-------------------+ PTV      Full                    Yes                                      +---------+---------------+---------+-----------+----------+-------------------+ PERO                                                  Not visualized      +---------+---------------+---------+-----------+----------+-------------------+   Right Technical Findings: Technically difficult and limited due to patient mobility  +---------+---------------+---------+-----------+----------+-------------------+ LEFT     CompressibilityPhasicitySpontaneityPropertiesThrombus Aging      +---------+---------------+---------+-----------+----------+-------------------+ CFV      Partial        Yes      Yes                  Chronic             +---------+---------------+---------+-----------+----------+-------------------+ SFJ      Partial                                      Chronic              +---------+---------------+---------+-----------+----------+-------------------+ FV Prox  Full           Yes      Yes                  Narrowed            +---------+---------------+---------+-----------+----------+-------------------+ FV Mid   Full                                                             +---------+---------------+---------+-----------+----------+-------------------+ FV DistalFull           Yes      Yes                  Narrowed            +---------+---------------+---------+-----------+----------+-------------------+ PFV      Full                                                             +---------+---------------+---------+-----------+----------+-------------------+ POP  Unable to visualize                                                       due to patient                                                            immobility          +---------+---------------+---------+-----------+----------+-------------------+ PTV      Full                                                             +---------+---------------+---------+-----------+----------+-------------------+ PERO                                                  Unable to visualize                                                       due to immobility   +---------+---------------+---------+-----------+----------+-------------------+   Left Technical Findings: Technically difficult and limited due to immobility of the patient.   Summary: Right: There is no evidence of deep vein thrombosis in the lower extremity. However, portions of this examination were limited- see technologist comments above. No cystic structure found in the popliteal fossa. See technical findings listed above Left: Findings consistent with chronic deep vein thrombosis involving the left common femoral vein. Findings consistent with chronic  superficial vein thrombosis involving the left great saphenous vein. There is no evidence of acute deep vein thrombosis in the lower extremity. However, portions of this examination were limited- see technologist comments above. Unable to evaluated for cystic structure. See technical findings listed above  *See table(s) above for measurements and observations. Electronically signed by Lemar Livings MD on 05/28/2019 at 12:15:05 AM.    Final     Microbiology: Recent Results (from the past 240 hour(s))  Novel Coronavirus, NAA (hospital order; send-out to ref lab)     Status: None   Collection Time: 06/02/19 11:59 AM   Specimen: Nasopharyngeal Swab; Respiratory  Result Value Ref Range Status   SARS-CoV-2, NAA NOT DETECTED NOT DETECTED Final    Comment: (NOTE) This nucleic acid amplification test was developed and its performance characteristics determined by [employer]. Nucleic acid amplification tests include PCR and TMA. This test has not been FDA cleared or approved. This test has been authorized by FDA under an Emergency Use Authorization (EUA). This test is only authorized for the duration of time the declaration that circumstances exist justifying the authorization of the emergency use of in vitro diagnostic tests for detection of SARS-CoV-2 virus and/or diagnosis of COVID-19 infection under section 564(b)(1) of the  Act, 21 U.S.C. 371GGY-6(R) (1), unless the authorization is terminated or revoked sooner. When diagnostic testing is negative, the possibility of a false negative result should be considered in the context of a patient's recent exposures and the presence of clinical signs and symptoms consistent with COVID-19. An individual without symptoms of COVID- 19 and who is not shedding SARS-CoV-2 vi rus would expect to have a negative (not detected) result in this assay. Performed At: [facility] [place], [place] [id number] Rush Farmer MD  JK:[id number]    [place]  Final    Comment: Performed at [facility] [address], [place], [place] [address]     Labs: Basic Metabolic Panel: Recent Labs  Lab 05/28/19 0113 05/31/19 0826 06/02/19 0440 06/03/19 0415  NA 142 145 145 147*  K 3.6 3.7 3.6 3.1*  CL 104 102 102 103  CO2 31 33* 32 33*  GLUCOSE 99 88 95 90  BUN 6* <5* 8 9  CREATININE 0.63 0.70 0.84 0.68  CALCIUM 8.1* 8.5* 8.3* 8.2*   Liver Function Tests: No results for input(s): AST, ALT, ALKPHOS, BILITOT, PROT, ALBUMIN in the last 168 hours. No results for input(s): LIPASE, AMYLASE in the last 168 hours. No results for input(s): AMMONIA in the last 168 hours. CBC: Recent Labs  Lab 05/30/19 0922 05/31/19 0826 06/01/19 0507 06/02/19 0440 06/03/19 0415  WBC 3.9* 3.6* 3.3* 5.1 4.1  NEUTROABS  --   --   --  2.7  --   HGB 10.5* 11.4* 10.5* 10.3* 10.5*  HCT 33.1* 35.9* 33.8* 32.6* 33.9*  MCV 101.5* 102.3* 103.4* 103.2* 102.7*  PLT 131* 123* 119* 126* 121*   Cardiac Enzymes: No results for input(s): CKTOTAL, CKMB, CKMBINDEX, TROPONINI in the last 168 hours. BNP: BNP (last 3 results) No results for input(s): BNP in the last 8760 hours.  ProBNP (last 3 results) No results for input(s): PROBNP in the last 8760 hours.  CBG: No results for input(s): GLUCAP in the last 168 hours.     Signed:  Irine Seal MD.  [facility] 06/03/2019, 2:29 PM

## 2019-06-03 NOTE — Progress Notes (Signed)
Stacie Wright for warfarin Indication: VTE treatment  Allergies  Allergen Reactions  . Amoxicillin Anaphylaxis and Rash  . Darvocet [Propoxyphene N-Acetaminophen] Other (See Comments)    hallucinations  . Penicillins Anaphylaxis and Rash    Has patient had a PCN reaction causing immediate rash, facial/tongue/throat swelling, SOB or lightheadedness with hypotension: Yes Has patient had a PCN reaction causing severe rash involving mucus membranes or skin necrosis: No Has patient had a PCN reaction that required hospitalization No Has patient had a PCN reaction occurring within the last 10 years: No If all of the above answers are "NO", then may proceed with Cephalosporin use.   . Sulfa Antibiotics Anaphylaxis and Rash  . Propoxyphene Other (See Comments)    hallucinations    Patient Measurements: Height: 5\' 6"  (167.6 cm) Weight: 169 lb 12.1 oz (77 kg) IBW/kg (Calculated) : 59.3  Vital Signs: Temp: 97.8 F (36.6 C) (09/18 0544) Temp Source: Oral (09/18 0544) BP: 150/75 (09/18 0544) Pulse Rate: 64 (09/18 0544)  Labs: Recent Labs    06/01/19 0507 06/01/19 1558 06/02/19 0440 06/03/19 0415  HGB 10.5*  --  10.3* 10.5*  HCT 33.8*  --  32.6* 33.9*  PLT 119*  --  126* 121*  LABPROT 36.4* 37.2* 34.1* 24.8*  INR 3.7* 3.8* 3.4* 2.3*  CREATININE  --   --  0.84 0.68    Estimated Creatinine Clearance: 50 mL/min (by C-G formula based on SCr of 0.68 mg/dL).  Assessment: Pharmacy consulted to dose warfarin and heparin bridge in this 85 year old female for VTE treatment.   Patient has PMH of alzheimer's dementia, HTN, MAI infection, normocytic anemia, and history of LLE DVT for which she was taking warfarin PTA. Pt admitted to hospital on 9/6 with rectal bleeding and supratherapeutic INR of 4.1. LD of warfarin was 9/5 PTA.  Anticoagulation has been held throughout admission, but is being resumed today. Venous dopplar (9/11) shows chronic DVT in left  leg. Per GI note recommendation (9/8), will target lower end of therapeutic range for INR. Per consult, ok to resume anticoagulation per GI.   Patient is on rifampin 600 mg PO three times a week - medication has significant drug interaction with warfarin, but patient was prescribed both PTA.  _____________________________________________  Warfarin dose PTA: warfarin 7.5 mg PO daily INR: 4.1 on admission  Significant Events: -9/6 - 9/10: Warfarin held -9/11: Venous dopplar showing chronic DVT in left leg. Anticoagulation resumed   Today, 06/03/2019:  CBC: Hgb and Plt both slightly low but stable  INR now returned to therapeutic range after holding warfarin for 3 days  No signs/symptoms bleeding or bruising per nursing.  Significant drug interactions: rifampin is is an inducer and can decrease serum concentrations of vitamin K antagonists. Of note, patient was on both medications PTA  Eating 0-25% of meals  Goal of Therapy:  INR 2-3 (target 2-2.5)   Plan:   Warfarin 2 mg PO x 1 tonight  INR daily; CBC q72 or more frequently as needed  Maintenance warfarin dose not yet evident; warfarin requirement decreased despite rifampin - likely d/t poor PO intake. At this point would discharge on warfarin 1-2 mg daily and recheck INR within 3 days   Reuel Boom, PharmD, BCPS [phone] 06/03/2019, 12:35 PM

## 2019-06-06 DIAGNOSIS — R1312 Dysphagia, oropharyngeal phase: Secondary | ICD-10-CM | POA: Diagnosis not present

## 2019-06-07 DIAGNOSIS — R791 Abnormal coagulation profile: Secondary | ICD-10-CM | POA: Diagnosis not present

## 2019-06-07 DIAGNOSIS — R1310 Dysphagia, unspecified: Secondary | ICD-10-CM | POA: Diagnosis not present

## 2019-06-07 DIAGNOSIS — K922 Gastrointestinal hemorrhage, unspecified: Secondary | ICD-10-CM | POA: Diagnosis not present

## 2019-06-07 DIAGNOSIS — D638 Anemia in other chronic diseases classified elsewhere: Secondary | ICD-10-CM | POA: Diagnosis not present

## 2019-06-15 DIAGNOSIS — K922 Gastrointestinal hemorrhage, unspecified: Secondary | ICD-10-CM | POA: Diagnosis not present

## 2019-06-15 DIAGNOSIS — R1310 Dysphagia, unspecified: Secondary | ICD-10-CM | POA: Diagnosis not present

## 2019-06-15 DIAGNOSIS — D638 Anemia in other chronic diseases classified elsewhere: Secondary | ICD-10-CM | POA: Diagnosis not present

## 2019-06-15 DIAGNOSIS — R791 Abnormal coagulation profile: Secondary | ICD-10-CM | POA: Diagnosis not present

## 2019-06-16 DIAGNOSIS — I82502 Chronic embolism and thrombosis of unspecified deep veins of left lower extremity: Secondary | ICD-10-CM | POA: Diagnosis not present

## 2019-06-16 DIAGNOSIS — D638 Anemia in other chronic diseases classified elsewhere: Secondary | ICD-10-CM | POA: Diagnosis not present

## 2019-06-16 DIAGNOSIS — D649 Anemia, unspecified: Secondary | ICD-10-CM | POA: Diagnosis not present

## 2019-06-16 DIAGNOSIS — I10 Essential (primary) hypertension: Secondary | ICD-10-CM | POA: Diagnosis not present

## 2019-06-21 DIAGNOSIS — R05 Cough: Secondary | ICD-10-CM | POA: Diagnosis not present

## 2019-07-17 DEATH — deceased
